# Patient Record
Sex: Female | Born: 1965
Health system: Southern US, Community
[De-identification: ages and names within clinical notes are randomized; demographics above are authoritative.]

## PROBLEM LIST (undated history)

## (undated) DIAGNOSIS — R569 Unspecified convulsions: Secondary | ICD-10-CM

## (undated) DIAGNOSIS — B54 Unspecified malaria: Secondary | ICD-10-CM

## (undated) DIAGNOSIS — O039 Complete or unspecified spontaneous abortion without complication: Secondary | ICD-10-CM

## (undated) DIAGNOSIS — S32000A Wedge compression fracture of unspecified lumbar vertebra, initial encounter for closed fracture: Secondary | ICD-10-CM

## (undated) DIAGNOSIS — C801 Malignant (primary) neoplasm, unspecified: Secondary | ICD-10-CM

## (undated) DIAGNOSIS — J329 Chronic sinusitis, unspecified: Secondary | ICD-10-CM

## (undated) DIAGNOSIS — J189 Pneumonia, unspecified organism: Secondary | ICD-10-CM

## (undated) DIAGNOSIS — R519 Headache, unspecified: Secondary | ICD-10-CM

## (undated) DIAGNOSIS — R51 Headache: Secondary | ICD-10-CM

## (undated) HISTORY — PX: WISDOM TOOTH EXTRACTION: SHX21

## (undated) HISTORY — DX: Chronic sinusitis, unspecified: J32.9

## (undated) HISTORY — DX: Wedge compression fracture of unspecified lumbar vertebra, initial encounter for closed fracture: S32.000A

## (undated) HISTORY — PX: DENTAL SURGERY: SHX609

---

## 1998-01-04 ENCOUNTER — Other Ambulatory Visit: Admission: RE | Admit: 1998-01-04 | Discharge: 1998-01-04 | Payer: Self-pay | Admitting: Obstetrics & Gynecology

## 1999-03-25 ENCOUNTER — Other Ambulatory Visit: Admission: RE | Admit: 1999-03-25 | Discharge: 1999-03-25 | Payer: Self-pay | Admitting: Obstetrics & Gynecology

## 2000-05-10 ENCOUNTER — Other Ambulatory Visit: Admission: RE | Admit: 2000-05-10 | Discharge: 2000-05-10 | Payer: Self-pay | Admitting: Obstetrics & Gynecology

## 2001-05-31 ENCOUNTER — Other Ambulatory Visit: Admission: RE | Admit: 2001-05-31 | Discharge: 2001-05-31 | Payer: Self-pay | Admitting: Obstetrics & Gynecology

## 2003-07-25 ENCOUNTER — Other Ambulatory Visit: Admission: RE | Admit: 2003-07-25 | Discharge: 2003-07-25 | Payer: Self-pay | Admitting: Obstetrics & Gynecology

## 2007-07-29 ENCOUNTER — Ambulatory Visit (HOSPITAL_COMMUNITY): Admission: RE | Admit: 2007-07-29 | Discharge: 2007-07-29 | Payer: Self-pay | Admitting: Family Medicine

## 2013-12-29 ENCOUNTER — Ambulatory Visit (INDEPENDENT_AMBULATORY_CARE_PROVIDER_SITE_OTHER): Payer: 59 | Admitting: Internal Medicine

## 2013-12-29 DIAGNOSIS — Z9189 Other specified personal risk factors, not elsewhere classified: Secondary | ICD-10-CM

## 2013-12-29 DIAGNOSIS — Z23 Encounter for immunization: Secondary | ICD-10-CM

## 2013-12-29 MED ORDER — CIPROFLOXACIN HCL 500 MG PO TABS: 500.0000 mg | ORAL_TABLET | Freq: Two times a day (BID) | ORAL | Status: DC

## 2013-12-29 NOTE — Progress Notes (Deleted)
Patient ID: Danielle Guzman, female   DOB: 09/23/1965, 48 y.o.   MRN: [id number]

## 2013-12-29 NOTE — Progress Notes (Signed)
   Subjective:    Patient ID: Danielle Guzman, female    DOB: [birth year], 48 y.o.   MRN: [id number]  HPI Danielle Guzman is a 48yo F who is in good health going to [place] and [place] for a 10 day trip, leaving on Oct 20th and return on Oct 30th.  Recently received: hep A #1, typhoid oral (Started yesterday), tdap, and given malarone  Previously lived in [place], now resides in [place], [place]. She has traveled to [place], [place], [place]  Review of Systems     Objective:   Physical Exam        Assessment & Plan:  Malaria = proph to start malarone  2 days prior to trip and 7 days post her return. Also gave recs of deet and premethrin spray  Vaccine = will give yellow fever  Traveler diarrhea = gave rx for cipro if needed  Gave note for her to carry last dose of vivotif on plane. Ideally should have received inj typhoid, unlikely to mount response in time for trip due to just starting her medicaiton.  Recommended emergency evacuation insurance

## 2016-03-16 DIAGNOSIS — C801 Malignant (primary) neoplasm, unspecified: Secondary | ICD-10-CM

## 2016-03-16 HISTORY — DX: Malignant (primary) neoplasm, unspecified: C80.1

## 2016-07-27 ENCOUNTER — Encounter (HOSPITAL_COMMUNITY): Payer: Self-pay | Admitting: *Deleted

## 2016-08-11 ENCOUNTER — Ambulatory Visit (HOSPITAL_COMMUNITY): Payer: 59 | Admitting: Anesthesiology

## 2016-08-11 ENCOUNTER — Ambulatory Visit (HOSPITAL_COMMUNITY)
Admission: RE | Admit: 2016-08-11 | Discharge: 2016-08-11 | Disposition: A | Discharge status: Home / Self Care | Payer: 59 | Source: Ambulatory Visit | Attending: Obstetrics | Admitting: Obstetrics

## 2016-08-11 ENCOUNTER — Encounter (HOSPITAL_COMMUNITY)
Admission: RE | Disposition: A | Discharge status: Home / Self Care | Payer: Self-pay | Source: Ambulatory Visit | Attending: Obstetrics

## 2016-08-11 ENCOUNTER — Encounter (HOSPITAL_COMMUNITY): Payer: Self-pay | Admitting: Obstetrics

## 2016-08-11 DIAGNOSIS — N950 Postmenopausal bleeding: Secondary | ICD-10-CM | POA: Insufficient documentation

## 2016-08-11 DIAGNOSIS — Z87891 Personal history of nicotine dependence: Secondary | ICD-10-CM | POA: Diagnosis not present

## 2016-08-11 DIAGNOSIS — C541 Malignant neoplasm of endometrium: Secondary | ICD-10-CM | POA: Insufficient documentation

## 2016-08-11 DIAGNOSIS — Z79899 Other long term (current) drug therapy: Secondary | ICD-10-CM | POA: Insufficient documentation

## 2016-08-11 DIAGNOSIS — Z91040 Latex allergy status: Secondary | ICD-10-CM | POA: Insufficient documentation

## 2016-08-11 HISTORY — PX: DILATATION & CURETTAGE/HYSTEROSCOPY WITH MYOSURE: SHX6511

## 2016-08-11 LAB — CBC
HEMATOCRIT: 43.1 % (ref 36.0–46.0)
Hemoglobin: 14.1 g/dL (ref 12.0–15.0)
MCH: 26.9 pg (ref 26.0–34.0)
MCHC: 32.7 g/dL (ref 30.0–36.0)
MCV: 82.3 fL (ref 78.0–100.0)
PLATELETS: 265 10*3/uL (ref 150–400)
RBC: 5.24 MIL/uL — ABNORMAL HIGH (ref 3.87–5.11)
RDW: 14.4 % (ref 11.5–15.5)
WBC: 7.1 10*3/uL (ref 4.0–10.5)

## 2016-08-11 SURGERY — DILATATION & CURETTAGE/HYSTEROSCOPY WITH MYOSURE
Anesthesia: General | Site: Vagina

## 2016-08-11 MED ORDER — SCOPOLAMINE 1 MG/3DAYS TD PT72
1.0000 | MEDICATED_PATCH | Freq: Once | TRANSDERMAL | Status: DC
  Administered 2016-08-11: 1.5 mg via TRANSDERMAL

## 2016-08-11 MED ORDER — MIDAZOLAM HCL 2 MG/2ML IJ SOLN
INTRAMUSCULAR | Status: DC | PRN
  Administered 2016-08-11: 2 mg via INTRAVENOUS

## 2016-08-11 MED ORDER — LIDOCAINE HCL (CARDIAC) 20 MG/ML IV SOLN
INTRAVENOUS | Status: AC
  Filled 2016-08-11: qty 5

## 2016-08-11 MED ORDER — LACTATED RINGERS IV SOLN
INTRAVENOUS | Status: DC
  Administered 2016-08-11: 1000 mL via INTRAVENOUS
  Administered 2016-08-11: 13:00:00 via INTRAVENOUS

## 2016-08-11 MED ORDER — DEXAMETHASONE SODIUM PHOSPHATE 4 MG/ML IJ SOLN
INTRAMUSCULAR | Status: DC | PRN
  Administered 2016-08-11: 4 mg via INTRAVENOUS

## 2016-08-11 MED ORDER — FENTANYL CITRATE (PF) 250 MCG/5ML IJ SOLN
INTRAMUSCULAR | Status: AC
  Filled 2016-08-11: qty 5

## 2016-08-11 MED ORDER — KETOROLAC TROMETHAMINE 30 MG/ML IJ SOLN
INTRAMUSCULAR | Status: DC | PRN
  Administered 2016-08-11: 30 mg via INTRAVENOUS

## 2016-08-11 MED ORDER — DEXAMETHASONE SODIUM PHOSPHATE 4 MG/ML IJ SOLN
INTRAMUSCULAR | Status: AC
  Filled 2016-08-11: qty 1

## 2016-08-11 MED ORDER — LIDOCAINE HCL 1 % IJ SOLN
INTRAMUSCULAR | Status: DC | PRN
  Administered 2016-08-11: 10 mL

## 2016-08-11 MED ORDER — SCOPOLAMINE 1 MG/3DAYS TD PT72
MEDICATED_PATCH | TRANSDERMAL | Status: AC
  Administered 2016-08-11: 1.5 mg via TRANSDERMAL
  Filled 2016-08-11: qty 1

## 2016-08-11 MED ORDER — LACTATED RINGERS IV SOLN: INTRAVENOUS | Status: DC

## 2016-08-11 MED ORDER — MIDAZOLAM HCL 2 MG/2ML IJ SOLN
INTRAMUSCULAR | Status: AC
  Filled 2016-08-11: qty 2

## 2016-08-11 MED ORDER — ONDANSETRON HCL 4 MG/2ML IJ SOLN
INTRAMUSCULAR | Status: DC | PRN
  Administered 2016-08-11: 4 mg via INTRAVENOUS

## 2016-08-11 MED ORDER — SODIUM CHLORIDE 0.9 % IR SOLN
Status: DC | PRN
  Administered 2016-08-11: 3000 mL

## 2016-08-11 MED ORDER — PROPOFOL 10 MG/ML IV BOLUS
INTRAVENOUS | Status: AC
  Filled 2016-08-11: qty 20

## 2016-08-11 MED ORDER — LIDOCAINE HCL 1 % IJ SOLN
INTRAMUSCULAR | Status: AC
  Filled 2016-08-11: qty 20

## 2016-08-11 MED ORDER — PHENYLEPHRINE 40 MCG/ML (10ML) SYRINGE FOR IV PUSH (FOR BLOOD PRESSURE SUPPORT)
PREFILLED_SYRINGE | INTRAVENOUS | Status: AC
  Filled 2016-08-11: qty 10

## 2016-08-11 MED ORDER — FENTANYL CITRATE (PF) 100 MCG/2ML IJ SOLN
INTRAMUSCULAR | Status: DC | PRN
  Administered 2016-08-11 (×3): 50 ug via INTRAVENOUS

## 2016-08-11 MED ORDER — PROPOFOL 10 MG/ML IV BOLUS
INTRAVENOUS | Status: DC | PRN
  Administered 2016-08-11: 180 mg via INTRAVENOUS

## 2016-08-11 MED ORDER — ONDANSETRON HCL 4 MG/2ML IJ SOLN
INTRAMUSCULAR | Status: AC
  Filled 2016-08-11: qty 2

## 2016-08-11 MED ORDER — KETOROLAC TROMETHAMINE 30 MG/ML IJ SOLN
INTRAMUSCULAR | Status: AC
  Filled 2016-08-11: qty 1

## 2016-08-11 MED ORDER — LIDOCAINE HCL (CARDIAC) 20 MG/ML IV SOLN
INTRAVENOUS | Status: DC | PRN
  Administered 2016-08-11: 60 mg via INTRAVENOUS

## 2016-08-11 SURGICAL SUPPLY — 19 items
CATH ROBINSON RED A/P 16FR (CATHETERS) IMPLANT
CATH SILICONE 16FRX5CC (CATHETERS) ×3 IMPLANT
CLOTH BEACON ORANGE TIMEOUT ST (SAFETY) ×3 IMPLANT
CONTAINER PREFILL 10% NBF 60ML (FORM) ×6 IMPLANT
DEVICE MYOSURE LITE (MISCELLANEOUS) ×3 IMPLANT
DEVICE MYOSURE REACH (MISCELLANEOUS) IMPLANT
FILTER ARTHROSCOPY CONVERTOR (FILTER) ×3 IMPLANT
GLOVE BIOGEL PI IND STRL 6.5 (GLOVE) ×1 IMPLANT
GLOVE BIOGEL PI IND STRL 7.0 (GLOVE) ×1 IMPLANT
GLOVE BIOGEL PI INDICATOR 6.5 (GLOVE) ×2
GLOVE BIOGEL PI INDICATOR 7.0 (GLOVE) ×2
GLOVE ECLIPSE 6.0 STRL STRAW (GLOVE) ×3 IMPLANT
GOWN STRL REUS W/TWL LRG LVL3 (GOWN DISPOSABLE) ×6 IMPLANT
PACK VAGINAL MINOR WOMEN LF (CUSTOM PROCEDURE TRAY) ×3 IMPLANT
PAD OB MATERNITY 4.3X12.25 (PERSONAL CARE ITEMS) ×3 IMPLANT
SEAL ROD LENS SCOPE MYOSURE (ABLATOR) ×3 IMPLANT
TOWEL OR 17X24 6PK STRL BLUE (TOWEL DISPOSABLE) ×6 IMPLANT
TUBING AQUILEX INFLOW (TUBING) ×3 IMPLANT
TUBING AQUILEX OUTFLOW (TUBING) ×3 IMPLANT

## 2016-08-11 NOTE — Brief Op Note (Signed)
08/11/2016  1:03 PM  PATIENT:  Danielle Guzman  51 y.o. female  PRE-OPERATIVE DIAGNOSIS:  Postmenopausal bleeding, POLYP  POST-OPERATIVE DIAGNOSIS:  Postmenopausal bleeding, thickened endometrium, polyp  PROCEDURE:  Procedure(s) with comments: DILATATION & CURETTAGE/HYSTEROSCOPY WITH MYOSURE (N/A) - polyp removal   SURGEON:  Surgeon(s) and Role:    Jerelyn Charles, MD - Primary  ANESTHESIA:   general  EBL:  Total I/O In: 1100 [I.V.:1100] Out: 2 [Urine:20; Blood:40]  BLOOD ADMINISTERED:none  DRAINS: none   LOCAL MEDICATIONS USED:  LIDOCAINE  and Amount: 10 ml  SPECIMEN:  Source of Specimen:  endometrial curettings  DISPOSITION OF SPECIMEN:  PATHOLOGY  COUNTS:  YES  TOURNIQUET:  * No tourniquets in log *  DICTATION: .Note written in EPIC  PLAN OF CARE: Discharge to home after PACU  PATIENT DISPOSITION:  PACU - hemodynamically stable.   Delay start of Pharmacological VTE agent (>24hrs) due to surgical blood loss or risk of bleeding: not applicable

## 2016-08-11 NOTE — Anesthesia Postprocedure Evaluation (Signed)
Anesthesia Post Note  Patient: Danielle Guzman  Procedure(s) Performed: Procedure(s) (LRB): DILATATION & CURETTAGE/HYSTEROSCOPY WITH MYOSURE (N/A)  Patient location during evaluation: PACU Anesthesia Type: General Level of consciousness: awake and alert, patient cooperative and oriented Pain management: pain level controlled Vital Signs Assessment: post-procedure vital signs reviewed and stable Respiratory status: spontaneous breathing, nonlabored ventilation and respiratory function stable Cardiovascular status: blood pressure returned to baseline and stable Postop Assessment: no signs of nausea or vomiting Anesthetic complications: no        Last Vitals:  Vitals:   08/11/16 1345 08/11/16 1430  BP: 121/73 123/64  Pulse: 69 (!) 59  Resp: 16 18  Temp: 36.6 C 36.6 C    Last Pain:  Vitals:   08/11/16 1430  TempSrc:   PainSc: 2    Pain Goal: Patients Stated Pain Goal: 4 (08/11/16 1133)               Mckinzee Spirito,E. Sharyl Panchal

## 2016-08-11 NOTE — Anesthesia Preprocedure Evaluation (Addendum)
Anesthesia Evaluation  Patient identified by MRN, date of birth, ID band Patient awake    Reviewed: Allergy & Precautions, NPO status , Patient's Chart, lab work & pertinent test results  History of Anesthesia Complications Negative for: history of anesthetic complications  Airway Mallampati: II  TM Distance: >3 FB Neck ROM: Full    Dental  (+) Dental Advisory Given   Pulmonary former smoker,    breath sounds clear to auscultation       Cardiovascular negative cardio ROS   Rhythm:Regular Rate:Normal     Neuro/Psych negative neurological ROS     GI/Hepatic negative GI ROS, Neg liver ROS,   Endo/Other  negative endocrine ROS  Renal/GU negative Renal ROS     Musculoskeletal negative musculoskeletal ROS (+)   Abdominal   Peds  Hematology negative hematology ROS (+)   Anesthesia Other Findings   Reproductive/Obstetrics Post menopausal (early-age 14)                             Anesthesia Physical Anesthesia Plan  ASA: II  Anesthesia Plan: General   Post-op Pain Management:    Induction: Intravenous  Airway Management Planned: LMA  Additional Equipment:   Intra-op Plan:   Post-operative Plan:   Informed Consent: I have reviewed the patients History and Physical, chart, labs and discussed the procedure including the risks, benefits and alternatives for the proposed anesthesia with the patient or authorized representative who has indicated his/her understanding and acceptance.   Dental advisory given  Plan Discussed with: CRNA and Surgeon  Anesthesia Plan Comments: (Plan routine monitors, GA- LMA OK)        Anesthesia Quick Evaluation

## 2016-08-11 NOTE — Anesthesia Procedure Notes (Signed)
Procedure Name: LMA Insertion Date/Time: 08/11/2016 12:20 PM Performed by: Elenore Paddy Pre-anesthesia Checklist: Patient identified, Emergency Drugs available, Suction available, Patient being monitored and Timeout performed Patient Re-evaluated:Patient Re-evaluated prior to inductionOxygen Delivery Method: Circle system utilized Preoxygenation: Pre-oxygenation with 100% oxygen Intubation Type: IV induction LMA: LMA inserted LMA Size: 4.0 Number of attempts: 1 Placement Confirmation: positive ETCO2 and breath sounds checked- equal and bilateral Dental Injury: Teeth and Oropharynx as per pre-operative assessment

## 2016-08-11 NOTE — Discharge Instructions (Signed)
Please take ibuprofen 600 mg every 6 hours as needed for pain.  You may also add tylenol (acetominophen) 650 mg every 6 hours as needed.  Please call the office if you experience heavy vaginal bleeding (saturating 1 pad hourly for > 2 hours in a row), temperature >101F, severe nausea, vomiting or any other concerns.

## 2016-08-11 NOTE — H&P (Signed)
51 y.o.  presents for hysteroscopy, myosure for postmenopausal bleeding.  She had a 4 day episode of PMB in March.  US showed a thickened endometrial stripe at 1.8cm, suspicious for possible polyp.  Bleeding returned again approximately 10 days ago and last for about a week.  Is not bleeding today.    Past Medical History:  Diagnosis Date  . Missed ab    x 2 - no surgery required     Past Surgical History:  Procedure Laterality Date  . WISDOM TOOTH EXTRACTION      OB History  Gravida Para Term Preterm AB Living  2       2    SAB TAB Ectopic Multiple Live Births  2            # Outcome Date GA Lbr Len/2nd Weight Sex Delivery Anes PTL Lv  2 SAB           1 SAB               Social History   Social History  . Marital status: Married    Spouse name: N/A  . Number of children: N/A  . Years of education: N/A   Occupational History  . Not on file.   Social History Main Topics  . Smoking status: Former Smoker    Years: 8.00    Types: Cigarettes  . Smokeless tobacco: Never Used     Comment: quit smoking at age 27  . Alcohol use 2.4 oz/week    4 Glasses of wine per week     Comment: wine  . Drug use: No  . Sexual activity: Yes    Birth control/ protection: Post-menopausal   Other Topics Concern  . Not on file   Social History Narrative  . No narrative on file   Latex    Vitals:   08/11/16 1133  BP: 132/77  Resp: 18  Temp: 98.1 F (36.7 C)     General:  NAD Abdomen:  soft Ext: SCDs    A/P   51 y.o.   presents for hysteroscopy, myosure for postmenopausal bleeding.   2 episodes of postmenopausal bleeding.  [place] with thickened endometrial stripe suggestive of an endometrial polyp. Discussed risks to include infection, bleeding, damage to surrounding structures (including but not limited to vagina, cervix, bladder, uterus), uterine perforation, inability to fully sample endometrium, recurrence of polyp, need for additional procedures.  All questions answered and  patient elects to proceed.   [place]

## 2016-08-11 NOTE — Op Note (Signed)
Operative Note  Pre-operative Diagnosis: postmenopausal bleeding, thickened endometrium, suspected endometrial polyp  Post-operative Diagnosis: Thickened endometrium, possible endometrial polyp  Surgeon: Jerelyn Charles, MD  Assistants: n/a  Procedure: hysteroscopy, polypectomy and curettage with MyoSure  Anesthesia: general  Estimated Blood Loss: 40 mL         Drains: none         Specimens: endometrial curettings and to pathology         Findings: Anteverted uterus.  Diffusely thickened and hypervascular endometrium.  Anteriorly, possible endometrial polyp within thickened endometrium.   Description of Procedure:         After adequate anesthesia was achieved, the patient placed in the dorsal lithotomy position in [place].  She was prepped and draped in the usual sterile fashion.  The bladder was drained with a catheter.  A bimanual exam revealed an anteverted uterus.  The bivalve speculum was placed in the vagina and the anterior lip of the cervix grasped with a single-tooth tenaculum.  The cervix was serially dilated with Hank dilators to 11mm.  Under direct visualization, the hysteroscope was advanced.  The uterine cavity was surveyed. The endometrium was noted to be diffusely thickened.  There was an area in the anterior endometrium that was consistent with a polyp.  The MyoSure device was advanced into the uterine cavity.  Under direct visualization, thickened endometrium was sampled in all quadrants.  There was an area in the right fundus that was not adequately able to be sampled with the MyoSure device, so the hysteroscope was removed and a sharp curettage was performed.  The hysteroscope was advanced back into the cavity, and a uniform endometrial cavity seen without residual polyps.  The tubal ostia were now able to be identified. No residual polyps or areas of thickened endometrium were seen.  The endometrial curettings were sent to pathology.  The hysteroscope was removed.   All the vaginal instruments were removed. The tenaculum site was hemostatic. Counts were correct.  The patient was awakened from anesthesia and transferred to PACU in stable condition.    [place], [facility]

## 2016-08-11 NOTE — Transfer of Care (Signed)
Immediate Anesthesia Transfer of Care Note  Patient: Danielle Guzman  Procedure(s) Performed: Procedure(s) with comments: DILATATION & CURETTAGE/HYSTEROSCOPY WITH MYOSURE (N/A) - polyp removal   Patient Location: PACU  Anesthesia Type:General  Level of Consciousness: awake, alert  and oriented  Airway & Oxygen Therapy: Patient Spontanous Breathing and Patient connected to nasal cannula oxygen  Post-op Assessment: Report given to RN and Post -op Vital signs reviewed and stable  Post vital signs: Reviewed and stable  Last Vitals:  Vitals:   08/11/16 1133  BP: 132/77  Resp: 18  Temp: 36.7 C    Last Pain:  Vitals:   08/11/16 1133  TempSrc: Oral      Patients Stated Pain Goal: 4 (60/63/01 6010)  Complications: No apparent anesthesia complications

## 2016-08-12 ENCOUNTER — Encounter (HOSPITAL_COMMUNITY): Payer: Self-pay | Admitting: Obstetrics

## 2016-08-21 ENCOUNTER — Telehealth: Payer: Self-pay | Admitting: *Deleted

## 2016-08-21 NOTE — Telephone Encounter (Signed)
Patient called back and appt scheduled for Monday. Patient aware of the date/time

## 2016-08-21 NOTE — Telephone Encounter (Signed)
Attempted to contact the patient, left message to call the office regarding appt. Patient needs new pt appt scheduled

## 2016-08-24 ENCOUNTER — Encounter: Payer: Self-pay | Admitting: Gynecologic Oncology

## 2016-08-24 ENCOUNTER — Ambulatory Visit: Payer: 59 | Attending: Gynecologic Oncology | Admitting: Gynecologic Oncology

## 2016-08-24 VITALS — BP 120/67 | HR 90 | Temp 98.7°F | Resp 18 | Ht 65.28 in | Wt 207.1 lb

## 2016-08-24 DIAGNOSIS — Z888 Allergy status to other drugs, medicaments and biological substances status: Secondary | ICD-10-CM | POA: Diagnosis not present

## 2016-08-24 DIAGNOSIS — N950 Postmenopausal bleeding: Secondary | ICD-10-CM | POA: Diagnosis not present

## 2016-08-24 DIAGNOSIS — Z87891 Personal history of nicotine dependence: Secondary | ICD-10-CM | POA: Diagnosis not present

## 2016-08-24 DIAGNOSIS — Z800 Family history of malignant neoplasm of digestive organs: Secondary | ICD-10-CM | POA: Diagnosis not present

## 2016-08-24 DIAGNOSIS — Z79899 Other long term (current) drug therapy: Secondary | ICD-10-CM | POA: Diagnosis not present

## 2016-08-24 DIAGNOSIS — Z9889 Other specified postprocedural states: Secondary | ICD-10-CM | POA: Diagnosis not present

## 2016-08-24 DIAGNOSIS — C541 Malignant neoplasm of endometrium: Secondary | ICD-10-CM | POA: Insufficient documentation

## 2016-08-24 DIAGNOSIS — Z803 Family history of malignant neoplasm of breast: Secondary | ICD-10-CM | POA: Insufficient documentation

## 2016-08-24 DIAGNOSIS — N941 Unspecified dyspareunia: Secondary | ICD-10-CM | POA: Diagnosis not present

## 2016-08-24 DIAGNOSIS — Z8049 Family history of malignant neoplasm of other genital organs: Secondary | ICD-10-CM | POA: Diagnosis not present

## 2016-08-24 NOTE — Progress Notes (Signed)
Consult Note: Gyn-Onc  Consult was requested by Dr. Loletta Specter and Dr Stann Mainland for the evaluation of Tedra Senegal 51 y.o. female  CC:  Chief Complaint  Patient presents with  . Endometrial cancer    New patient    Assessment/Plan:  Ms. JONEA BUKOWSKI  is a 51 y.o.  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  A detailed discussion was held with the patient and her family with regard to to her endometrial cancer diagnosis. We discussed the standard management options for uterine cancer which includes surgery followed possibly by adjuvant therapy depending on the results of surgery. The options for surgical management include a hysterectomy and removal of the tubes and ovaries possibly with removal of pelvic and para-aortic lymph nodes.If feasible, a minimally invasive approach including a robotic hysterectomy or laparoscopic hysterectomy have benefits including shorter hospital stay, recovery time and better wound healing than with open surgery. The patient has been counseled about these surgical options and the risks of surgery in general including infection, bleeding, damage to surrounding structures (including bowel, bladder, ureters, nerves or vessels), and the postoperative risks of PE/ DVT, and lymphedema. I extensively reviewed the additional risks of robotic hysterectomy including possible need for conversion to open laparotomy.  I discussed positioning during surgery of trendelenberg and risks of minor facial swelling and care we take in preoperative positioning.  After counseling and consideration of her options, she desires to proceed with robotic assisted total hysterectomy with bilateral sapingo-oophorectomy and SLN biopsy.   I discussed that there is an increased risk for metastatic disease with high grade cancers, therefore we will order a preoperative CT abdo/pelvis to better prepare surgically.  Additionally, if clear cell/serous  carcinoma is confirmed on final pathology, she will likely be recommended to have adjuvant therapy with systemic therapy (chemotherapy) as these cancers are associated with a high risk for distant relapse even in early stage disease.  She will be seen by anesthesia for preoperative clearance and discussion of postoperative pain management.  She was given the opportunity to ask questions, which were answered to her satisfaction, and she is agreement with the above mentioned plan of care.  She has a very strong history of family members (particularly maternal) with cancer. We will test her hysterectomy specimen for MSI and refer regardless to genetics postop for further evaluation.   HPI: Kharis Lapenna is a 51 year old [id number] who is seen in consultation at the request of Dr Stann Mainland and Dr Jerelyn Charles for endometrial cancer.  The patient reports early menopause at age 36 (all of her female relatives have had early menopause). She began experiencing postmenopausal bleeding in March 2018. She was seen by Dr Stann Mainland on 06/02/16 and a TVUS showed an endometrium of 1.8cm with a 3cm hypervascular polyp.  She was taken to the OR on 08/11/16 for a hysteroscopy/D&C with Dr Jerelyn Charles and the specimen showed endometrial adenocarcinoma with the majority of the specimen revealing grade 2 endometrioid endometrial cancer with a small focus (5%) of clear cell carcinoma and a few foci suspicious for serous carcinoma.  She is otherwise very healthy. She has had no prior surgeries or vaginal deliveries.  Her mother has a history of breast cancer x 3, she has a sister with a history of cervical cancer. Her maternal aunt has a history of breast cancer. A different maternal aunt had uterine and colon cancer. Her maternal cousin had uterine cancer. Her paternal grandmother  had a diagnosis of colon cancer and her paternal cousins had hysterectomies for "abnromal cells" but no cancer.  Current Meds:  Outpatient Encounter  Prescriptions as of 08/24/2016  Medication Sig  . Ascorbic Acid (VITAMIN C) 100 MG tablet Take 100 mg by mouth 3 (three) times daily as needed (cold symptoms).   No facility-administered encounter medications on file as of 08/24/2016.     Allergy:  Allergies  Allergen Reactions  . Latex     Long exposure causes a rash  . Tamiflu  [Oseltamivir Phosphate] Nausea Only    Social Hx:   Social History   Social History  . Marital status: Married    Spouse name: N/A  . Number of children: N/A  . Years of education: N/A   Occupational History  . Not on file.   Social History Main Topics  . Smoking status: Former Smoker    Years: 8.00    Types: Cigarettes  . Smokeless tobacco: Never Used     Comment: quit smoking at age 58  . Alcohol use 2.4 oz/week    4 Glasses of wine per week     Comment: wine  . Drug use: No  . Sexual activity: Yes    Birth control/ protection: Post-menopausal   Other Topics Concern  . Not on file   Social History Narrative  . No narrative on file    Past Surgical Hx:  Past Surgical History:  Procedure Laterality Date  . DENTAL SURGERY     gum graft 20 yrs ago and again on 08/26/16  . DILATATION & CURETTAGE/HYSTEROSCOPY WITH MYOSURE N/A 08/11/2016   Procedure: DILATATION & CURETTAGE/HYSTEROSCOPY WITH MYOSURE;  Surgeon: Jerelyn Charles, MD;  Location: [facility];  Service: Gynecology;  Laterality: N/A;  polyp removal   . WISDOM TOOTH EXTRACTION      Past Medical Hx:  Past Medical History:  Diagnosis Date  . Missed ab    x 2 - no surgery required     Past Gynecological History:  Premature menopause No LMP recorded. Patient is postmenopausal.  Family Hx:  Family History  Problem Relation Age of Onset  . Cancer Mother   . Cancer Sister        Cervix  . Cancer Maternal Aunt        Breast, uterine  . Cancer Paternal Grandmother        Colon    Review of Systems:  Constitutional  Feels well,    ENT Normal appearing ears and nares  bilaterally Skin/Breast  No rash, sores, jaundice, itching, dryness Cardiovascular  No chest pain, shortness of breath, or edema  Pulmonary  No cough or wheeze.  Gastro Intestinal  No nausea, vomitting, or diarrhoea. No bright red blood per rectum, no abdominal pain, change in bowel movement, or constipation.  Genito Urinary  No frequency, urgency, dysuria, +postmenopausal bleeding, +dyspareunia Musculo Skeletal  No myalgia, arthralgia, joint swelling or pain  Neurologic  No weakness, numbness, change in gait,  Psychology  No depression, anxiety, insomnia.   Vitals:  Blood pressure 120/67, pulse 90, temperature 98.7 F (37.1 C), temperature source Oral, resp. rate 18, height 5' 5.28" (1.658 m), weight 207 lb 1.6 oz (93.9 kg).  Physical Exam: WD in NAD Neck  Supple NROM, without any enlargements.  Lymph Node Survey No cervical supraclavicular or inguinal adenopathy Cardiovascular  Pulse normal rate, regularity and rhythm. S1 and S2 normal.  Lungs  Clear to auscultation bilateraly, without wheezes/crackles/rhonchi. Good air movement.  Skin  No  rash/lesions/breakdown  Psychiatry  Alert and oriented to person, place, and time  Abdomen  Normoactive bowel sounds, abdomen soft, non-tender and obese without evidence of hernia.  Back No CVA tenderness Genito Urinary  Vulva/vagina: Normal external female genitalia.   No lesions. No discharge or bleeding.  Bladder/urethra:  No lesions or masses, well supported bladder  Vagina: normal  Cervix: Normal appearing, no lesions.  Uterus:  Small, mobile, no parametrial involvement or nodularity.  Adnexa: no palpable masses. Rectal  deferred Extremities  No bilateral cyanosis, clubbing or edema.   Donaciano Eva, MD  08/24/2016, 12:07 PM

## 2016-08-24 NOTE — Patient Instructions (Signed)
Plan to have a CT scan of the abdomen and pelvis.  Nothing to eat or drink 4 hours before.                Preparing for your Surgery  Plan for surgery on September 01, 2016 with Dr. Everitt Amber at [facility] will be scheduled for a robotic assisted total hysterectomy, bilateral salpingo-oophorectomy, sentinel lymph node biopsy.  Pre-operative Testing -You will receive a phone call from presurgical testing at [facility] to arrange for a pre-operative testing appointment before your surgery.  This appointment normally occurs one to two weeks before your scheduled surgery.   -Bring your insurance card, copy of an advanced directive if applicable, medication list  -At that visit, you will be asked to sign a consent for a possible blood transfusion in case a transfusion becomes necessary during surgery.  The need for a blood transfusion is rare but having consent is a necessary part of your care.     -You should not be taking blood thinners or aspirin at least ten days prior to surgery unless instructed by your surgeon.  Day Before Surgery at [facility] will be asked to take in a light diet the day before surgery.  Avoid carbonated beverages.  You will be advised to have nothing to eat or drink after midnight the evening before.     Eat a light diet the day before surgery.  Examples including soups, broths, toast, yogurt, mashed potatoes.  Things to avoid include carbonated beverages (fizzy beverages), raw fruits and raw vegetables, or beans.    If your bowels are filled with gas, your surgeon will have difficulty visualizing your pelvic organs which increases your surgical risks.  Your role in recovery Your role is to become active as soon as directed by your doctor, while still giving yourself time to heal.  Rest when you feel tired. You will be asked to do the following in order to speed your recovery:  - Cough and breathe deeply. This helps toclear and expand your lungs and  can prevent pneumonia. You may be given a spirometer to practice deep breathing. A staff member will show you how to use the spirometer. - Do mild physical activity. Walking or moving your legs help your circulation and body functions return to normal. A staff member will help you when you try to walk and will provide you with simple exercises. Do not try to get up or walk alone the first time. - Actively manage your pain. Managing your pain lets you move in comfort. We will ask you to rate your pain on a scale of zero to 10. It is your responsibility to tell your doctor or nurse where and how much you hurt so your pain can be treated.  Special Considerations -If you are diabetic, you may be placed on insulin after surgery to have closer control over your blood sugars to promote healing and recovery.  This does not mean that you will be discharged on insulin.  If applicable, your oral antidiabetics will be resumed when you are tolerating a solid diet.  -Your final pathology results from surgery should be available by the Friday after surgery and the results will be relayed to you when available.   Blood Transfusion Information WHAT IS A BLOOD TRANSFUSION? A transfusion is the replacement of blood or some of its parts. Blood is made up of multiple cells which provide different functions.  Red blood cells carry oxygen  and are used for blood loss replacement.  White blood cells fight against infection.  Platelets control bleeding.  Plasma helps clot blood.  Other blood products are available for specialized needs, such as hemophilia or other clotting disorders. BEFORE THE TRANSFUSION  Who gives blood for transfusions?   You may be able to donate blood to be used at a later date on yourself (autologous donation).  Relatives can be asked to donate blood. This is generally not any safer than if you have received blood from a stranger. The same precautions are taken to ensure safety when a  relative's blood is donated.  Healthy volunteers who are fully evaluated to make sure their blood is safe. This is blood bank blood. Transfusion therapy is the safest it has ever been in the practice of medicine. Before blood is taken from a donor, a complete history is taken to make sure that person has no history of diseases nor engages in risky social behavior (examples are intravenous drug use or sexual activity with multiple partners). The donor's travel history is screened to minimize risk of transmitting infections, such as malaria. The donated blood is tested for signs of infectious diseases, such as HIV and hepatitis. The blood is then tested to be sure it is compatible with you in order to minimize the chance of a transfusion reaction. If you or a relative donates blood, this is often done in anticipation of surgery and is not appropriate for emergency situations. It takes many days to process the donated blood. RISKS AND COMPLICATIONS Although transfusion therapy is very safe and saves many lives, the main dangers of transfusion include:   Getting an infectious disease.  Developing a transfusion reaction. This is an allergic reaction to something in the blood you were given. Every precaution is taken to prevent this. The decision to have a blood transfusion has been considered carefully by your caregiver before blood is given. Blood is not given unless the benefits outweigh the risks.

## 2016-08-26 NOTE — Patient Instructions (Addendum)
Danielle Guzman  08/26/2016   Your procedure is scheduled on: 09-01-16  Report to [facility] Main  Entrance Take [place]  elevators to 3rd floor to  [place] at 1015 AM.   Call this number if you have problems the morning of surgery [phone]   Remember: ONLY 1 PERSON MAY GO WITH YOU TO SHORT STAY TO GET  READY MORNING OF [place].  Do not eat food or drink liquids :After Midnight. EAT A LIGHT DIET DAY BEFORE SURGERY ON 08-31-16, SEE INSTRUCTIONS BELOW, NOTHING BY MOUTH AFTER MIDNIGHT NIGHT BEFORE SURGERY     Take these medicines the morning of surgery with A SIP OF WATER: NONE                               You may not have any metal on your body including hair pins and              piercings  Do not wear jewelry, make-up, lotions, powders or perfumes, deodorant             Do not wear nail polish.  Do not shave  48 hours prior to surgery.              Men may shave face and neck.   Do not bring valuables to the hospital. [place].  Contacts, dentures or bridgework may not be worn into surgery.  Leave suitcase in the car. After surgery it may be brought to your room.                 Please read over the following fact sheets you were given: _____________________________________________________________________  Eat a light diet the day before surgery.  Examples including soups, broths, toast, yogurt, mashed potatoes.  Things to avoid include carbonated beverages (fizzy beverages), raw fruits and raw vegetables, or beans.   If your bowels are filled with gas, your surgeon will have difficulty visualizing your pelvic organs which increases your surgical risks.           [place] - Preparing for Surgery Before surgery, you can play an important role.  Because skin is not sterile, your skin needs to be as free of germs as possible.  You can reduce the number of germs on your skin by  washing with CHG (chlorahexidine gluconate) soap before surgery.  CHG is an antiseptic cleaner which kills germs and bonds with the skin to continue killing germs even after washing. Please DO NOT use if you have an allergy to CHG or antibacterial soaps.  If your skin becomes reddened/irritated stop using the CHG and inform your nurse when you arrive at Short Stay. Do not shave (including legs and underarms) for at least 48 hours prior to the first CHG shower.  You may shave your face/neck. Please follow these instructions carefully:  1.  Shower with CHG Soap the night before surgery and the  morning of Surgery.  2.  If you choose to wash your hair, wash your hair first as usual with your  normal  shampoo.  3.  After you shampoo, rinse your hair and body thoroughly to remove the  shampoo.  4.  Use CHG as you would any other liquid soap.  You can apply chg directly  to the skin and wash                       Gently with a scrungie or clean washcloth.  5.  Apply the CHG Soap to your body ONLY FROM THE NECK DOWN.   Do not use on face/ open                           Wound or open sores. Avoid contact with eyes, ears mouth and genitals (private parts).                       Wash face,  Genitals (private parts) with your normal soap.             6.  Wash thoroughly, paying special attention to the area where your surgery  will be performed.  7.  Thoroughly rinse your body with warm water from the neck down.  8.  DO NOT shower/wash with your normal soap after using and rinsing off  the CHG Soap.                9.  Pat yourself dry with a clean towel.            10.  Wear clean pajamas.            11.  Place clean sheets on your bed the night of your first shower and do not  sleep with pets. Day of Surgery : Do not apply any lotions/deodorants the morning of surgery.  Please wear clean clothes to the hospital/surgery center.  FAILURE TO FOLLOW THESE INSTRUCTIONS MAY RESULT IN THE  CANCELLATION OF YOUR SURGERY PATIENT SIGNATURE_________________________________  NURSE SIGNATURE__________________________________  ________________________________________________________________________   Adam Phenix  An incentive spirometer is a tool that can help keep your lungs clear and active. This tool measures how well you are filling your lungs with each breath. Taking long deep breaths may help reverse or decrease the chance of developing breathing (pulmonary) problems (especially infection) following:  A long period of time when you are unable to move or be active. BEFORE THE PROCEDURE   If the spirometer includes an indicator to show your best effort, your nurse or respiratory therapist will set it to a desired goal.  If possible, sit up straight or lean slightly forward. Try not to slouch.  Hold the incentive spirometer in an upright position. INSTRUCTIONS FOR USE  1. Sit on the edge of your bed if possible, or sit up as far as you can in bed or on a chair. 2. Hold the incentive spirometer in an upright position. 3. Breathe out normally. 4. Place the mouthpiece in your mouth and seal your lips tightly around it. 5. Breathe in slowly and as deeply as possible, raising the piston or the ball toward the top of the column. 6. Hold your breath for 3-5 seconds or for as long as possible. Allow the piston or ball to fall to the bottom of the column. 7. Remove the mouthpiece from your mouth and breathe out normally. 8. Rest for a few seconds and repeat Steps 1 through 7 at least 10 times every 1-2 hours when you are awake. Take your time and take a few normal breaths between deep breaths. 9. The spirometer may include an indicator to show  your best effort. Use the indicator as a goal to work toward during each repetition. 10. After each set of 10 deep breaths, practice coughing to be sure your lungs are clear. If you have an incision (the cut made at the time of surgery),  support your incision when coughing by placing a pillow or rolled up towels firmly against it. Once you are able to get out of bed, walk around indoors and cough well. You may stop using the incentive spirometer when instructed by your caregiver.  RISKS AND COMPLICATIONS  Take your time so you do not get dizzy or light-headed.  If you are in pain, you may need to take or ask for pain medication before doing incentive spirometry. It is harder to take a deep breath if you are having pain. AFTER USE  Rest and breathe slowly and easily.  It can be helpful to keep track of a log of your progress. Your caregiver can provide you with a simple table to help with this. If you are using the spirometer at home, follow these instructions: [place] IF:   You are having difficultly using the spirometer.  You have trouble using the spirometer as often as instructed.  Your pain medication is not giving enough relief while using the spirometer.  You develop fever of 100.5 F (38.1 C) or higher. SEEK IMMEDIATE MEDICAL CARE IF:   You cough up bloody sputum that had not been present before.  You develop fever of 102 F (38.9 C) or greater.  You develop worsening pain at or near the incision site. MAKE SURE YOU:   Understand these instructions.  Will watch your condition.  Will get help right away if you are not doing well or get worse. Document Released: 07/13/2006 Document Revised: 05/25/2011 Document Reviewed: 09/13/2006 ExitCare Patient Information 2014 ExitCare, [place].   ________________________________________________________________________  WHAT IS A BLOOD TRANSFUSION? Blood Transfusion Information  A transfusion is the replacement of blood or some of its parts. Blood is made up of multiple cells which provide different functions.  Red blood cells carry oxygen and are used for blood loss replacement.  White blood cells fight against infection.  Platelets control  bleeding.  Plasma helps clot blood.  Other blood products are available for specialized needs, such as hemophilia or other clotting disorders. BEFORE THE TRANSFUSION  Who gives blood for transfusions?   Healthy volunteers who are fully evaluated to make sure their blood is safe. This is blood bank blood. Transfusion therapy is the safest it has ever been in the practice of medicine. Before blood is taken from a donor, a complete history is taken to make sure that person has no history of diseases nor engages in risky social behavior (examples are intravenous drug use or sexual activity with multiple partners). The donor's travel history is screened to minimize risk of transmitting infections, such as malaria. The donated blood is tested for signs of infectious diseases, such as HIV and hepatitis. The blood is then tested to be sure it is compatible with you in order to minimize the chance of a transfusion reaction. If you or a relative donates blood, this is often done in anticipation of surgery and is not appropriate for emergency situations. It takes many days to process the donated blood. RISKS AND COMPLICATIONS Although transfusion therapy is very safe and saves many lives, the main dangers of transfusion include:   Getting an infectious disease.  Developing a transfusion reaction. This is an allergic reaction to  something in the blood you were given. Every precaution is taken to prevent this. The decision to have a blood transfusion has been considered carefully by your caregiver before blood is given. Blood is not given unless the benefits outweigh the risks. AFTER THE TRANSFUSION  Right after receiving a blood transfusion, you will usually feel much better and more energetic. This is especially true if your red blood cells have gotten low (anemic). The transfusion raises the level of the red blood cells which carry oxygen, and this usually causes an energy increase.  The nurse  administering the transfusion will monitor you carefully for complications. HOME CARE INSTRUCTIONS  No special instructions are needed after a transfusion. You may find your energy is better. Speak with your caregiver about any limitations on activity for underlying diseases you may have. SEEK MEDICAL CARE IF:   Your condition is not improving after your transfusion.  You develop redness or irritation at the intravenous (IV) site. SEEK IMMEDIATE MEDICAL CARE IF:  Any of the following symptoms occur over the next 12 hours:  Shaking chills.  You have a temperature by mouth above 102 F (38.9 C), not controlled by medicine.  Chest, back, or muscle pain.  People around you feel you are not acting correctly or are confused.  Shortness of breath or difficulty breathing.  Dizziness and fainting.  You get a rash or develop hives.  You have a decrease in urine output.  Your urine turns a dark color or changes to pink, red, or brown. Any of the following symptoms occur over the next 10 days:  You have a temperature by mouth above 102 F (38.9 C), not controlled by medicine.  Shortness of breath.  Weakness after normal activity.  The white part of the eye turns yellow (jaundice).  You have a decrease in the amount of urine or are urinating less often.  Your urine turns a dark color or changes to pink, red, or brown. Document Released: 02/28/2000 Document Revised: 05/25/2011 Document Reviewed: 10/17/2007 ExitCare Patient Information 2014 [place] OFTEN TO PREVENT PNEUMONIA AFTER SURGERY _______________________________________________________________________

## 2016-08-27 ENCOUNTER — Encounter (HOSPITAL_COMMUNITY)
Admission: RE | Admit: 2016-08-27 | Discharge: 2016-08-27 | Disposition: A | Discharge status: Home / Self Care | Payer: 59 | Source: Ambulatory Visit | Attending: Gynecologic Oncology | Admitting: Gynecologic Oncology

## 2016-08-27 ENCOUNTER — Encounter (HOSPITAL_COMMUNITY): Payer: Self-pay

## 2016-08-27 ENCOUNTER — Ambulatory Visit (HOSPITAL_COMMUNITY): Admission: RE | Admit: 2016-08-27 | Payer: 59 | Source: Ambulatory Visit

## 2016-08-27 ENCOUNTER — Ambulatory Visit (HOSPITAL_COMMUNITY)
Admission: RE | Admit: 2016-08-27 | Discharge: 2016-08-27 | Disposition: A | Discharge status: Home / Self Care | Payer: 59 | Source: Ambulatory Visit | Attending: Gynecologic Oncology | Admitting: Gynecologic Oncology

## 2016-08-27 DIAGNOSIS — C541 Malignant neoplasm of endometrium: Secondary | ICD-10-CM

## 2016-08-27 DIAGNOSIS — R911 Solitary pulmonary nodule: Secondary | ICD-10-CM | POA: Insufficient documentation

## 2016-08-27 HISTORY — DX: Pneumonia, unspecified organism: J18.9

## 2016-08-27 HISTORY — DX: Complete or unspecified spontaneous abortion without complication: O03.9

## 2016-08-27 HISTORY — DX: Unspecified convulsions: R56.9

## 2016-08-27 HISTORY — DX: Headache: R51

## 2016-08-27 HISTORY — DX: Malignant (primary) neoplasm, unspecified: C80.1

## 2016-08-27 HISTORY — DX: Headache, unspecified: R51.9

## 2016-08-27 HISTORY — DX: Unspecified malaria: B54

## 2016-08-27 LAB — CBC WITH DIFFERENTIAL/PLATELET
BASOS PCT: 0 %
Basophils Absolute: 0 10*3/uL (ref 0.0–0.1)
EOS ABS: 0.3 10*3/uL (ref 0.0–0.7)
EOS PCT: 3 %
HEMATOCRIT: 42 % (ref 36.0–46.0)
Hemoglobin: 13.7 g/dL (ref 12.0–15.0)
Lymphocytes Relative: 26 %
Lymphs Abs: 2.3 10*3/uL (ref 0.7–4.0)
MCH: 26.6 pg (ref 26.0–34.0)
MCHC: 32.6 g/dL (ref 30.0–36.0)
MCV: 81.6 fL (ref 78.0–100.0)
MONO ABS: 0.5 10*3/uL (ref 0.1–1.0)
MONOS PCT: 5 %
NEUTROS ABS: 5.8 10*3/uL (ref 1.7–7.7)
Neutrophils Relative %: 66 %
PLATELETS: 254 10*3/uL (ref 150–400)
RBC: 5.15 MIL/uL — ABNORMAL HIGH (ref 3.87–5.11)
RDW: 14.2 % (ref 11.5–15.5)
WBC: 8.8 10*3/uL (ref 4.0–10.5)

## 2016-08-27 LAB — COMPREHENSIVE METABOLIC PANEL
ALBUMIN: 4.2 g/dL (ref 3.5–5.0)
ALT: 33 U/L (ref 14–54)
AST: 24 U/L (ref 15–41)
Alkaline Phosphatase: 114 U/L (ref 38–126)
Anion gap: 10 (ref 5–15)
BUN: 13 mg/dL (ref 6–20)
CHLORIDE: 103 mmol/L (ref 101–111)
CO2: 27 mmol/L (ref 22–32)
Calcium: 9.4 mg/dL (ref 8.9–10.3)
Creatinine, Ser: 0.89 mg/dL (ref 0.44–1.00)
GFR calc Af Amer: >60 mL/min (ref >60)
GFR calc non Af Amer: >60 mL/min (ref >60)
GLUCOSE: 96 mg/dL (ref 65–99)
POTASSIUM: 4.5 mmol/L (ref 3.5–5.1)
SODIUM: 140 mmol/L (ref 135–145)
TOTAL PROTEIN: 6.8 g/dL (ref 6.5–8.1)
Total Bilirubin: 0.7 mg/dL (ref 0.3–1.2)

## 2016-08-27 LAB — URINALYSIS, ROUTINE W REFLEX MICROSCOPIC
BILIRUBIN URINE: NEGATIVE
Bacteria, UA: NONE SEEN
GLUCOSE, UA: NEGATIVE mg/dL
Hgb urine dipstick: NEGATIVE
KETONES UR: NEGATIVE mg/dL
Nitrite: NEGATIVE
PH: 5 (ref 5.0–8.0)
PROTEIN: NEGATIVE mg/dL
Specific Gravity, Urine: 1.005 (ref 1.005–1.030)

## 2016-08-27 MED ORDER — IOPAMIDOL (ISOVUE-300) INJECTION 61%
100.0000 mL | Freq: Once | INTRAVENOUS | Status: AC | PRN
  Administered 2016-08-27: 100 mL via INTRAVENOUS

## 2016-08-27 MED ORDER — IOPAMIDOL (ISOVUE-300) INJECTION 61%
INTRAVENOUS | Status: AC
  Filled 2016-08-27: qty 100

## 2016-08-28 ENCOUNTER — Ambulatory Visit (HOSPITAL_COMMUNITY): Payer: 59

## 2016-08-28 ENCOUNTER — Telehealth: Payer: Self-pay

## 2016-08-28 LAB — ABO/RH: ABO/RH(D): O POS

## 2016-08-28 NOTE — Telephone Encounter (Signed)
Told Ms Pancake that the CT scan did not show any adenopathy or metastatic disease.  A small lung nodule was seen in the right lower lobe of the lung. It is highly likely to be bengin on the way the nodule looks. Per Melissa Cross,NP. Can discuss with Dr. Denman George at post op visit if any imaging  follow up needed for nodule.

## 2016-08-31 NOTE — Progress Notes (Signed)
Patient called and notified of time change for surgery and to arrive at [place] at 0945 am. NPO after midnight and take any medications as instructed at pre-op appointment. Patient verbalized understanding.

## 2016-09-01 ENCOUNTER — Ambulatory Visit (HOSPITAL_COMMUNITY)
Admission: RE | Admit: 2016-09-01 | Discharge: 2016-09-02 | Disposition: A | Discharge status: Home / Self Care | Payer: 59 | Source: Ambulatory Visit | Attending: Gynecologic Oncology | Admitting: Gynecologic Oncology

## 2016-09-01 ENCOUNTER — Telehealth: Payer: Self-pay | Admitting: *Deleted

## 2016-09-01 ENCOUNTER — Encounter (HOSPITAL_COMMUNITY): Payer: Self-pay | Admitting: Anesthesiology

## 2016-09-01 ENCOUNTER — Encounter (HOSPITAL_COMMUNITY)
Admission: RE | Disposition: A | Discharge status: Home / Self Care | Payer: Self-pay | Source: Ambulatory Visit | Attending: Gynecologic Oncology

## 2016-09-01 ENCOUNTER — Ambulatory Visit (HOSPITAL_COMMUNITY): Payer: 59 | Admitting: Anesthesiology

## 2016-09-01 DIAGNOSIS — Z800 Family history of malignant neoplasm of digestive organs: Secondary | ICD-10-CM | POA: Diagnosis not present

## 2016-09-01 DIAGNOSIS — Z87891 Personal history of nicotine dependence: Secondary | ICD-10-CM | POA: Insufficient documentation

## 2016-09-01 DIAGNOSIS — C541 Malignant neoplasm of endometrium: Secondary | ICD-10-CM | POA: Diagnosis not present

## 2016-09-01 DIAGNOSIS — Z803 Family history of malignant neoplasm of breast: Secondary | ICD-10-CM | POA: Diagnosis not present

## 2016-09-01 HISTORY — PX: LYMPH NODE BIOPSY: SHX201

## 2016-09-01 HISTORY — PX: ROBOTIC ASSISTED TOTAL HYSTERECTOMY WITH BILATERAL SALPINGO OOPHERECTOMY: SHX6086

## 2016-09-01 LAB — TYPE AND SCREEN
ABO/RH(D): O POS
ANTIBODY SCREEN: NEGATIVE

## 2016-09-01 SURGERY — ROBOTIC ASSISTED TOTAL HYSTERECTOMY WITH BILATERAL SALPINGO OOPHORECTOMY
Anesthesia: General

## 2016-09-01 MED ORDER — METOCLOPRAMIDE HCL 5 MG/ML IJ SOLN: 10.0000 mg | Freq: Once | INTRAMUSCULAR | Status: DC | PRN

## 2016-09-01 MED ORDER — ONDANSETRON HCL 4 MG/2ML IJ SOLN: 4.0000 mg | Freq: Four times a day (QID) | INTRAMUSCULAR | Status: DC | PRN

## 2016-09-01 MED ORDER — SCOPOLAMINE 1 MG/3DAYS TD PT72
MEDICATED_PATCH | TRANSDERMAL | Status: AC
  Filled 2016-09-01: qty 1

## 2016-09-01 MED ORDER — LIDOCAINE 2% (20 MG/ML) 5 ML SYRINGE
INTRAMUSCULAR | Status: DC | PRN
  Administered 2016-09-01: 100 mg via INTRAVENOUS

## 2016-09-01 MED ORDER — MIDAZOLAM HCL 2 MG/2ML IJ SOLN
INTRAMUSCULAR | Status: AC
Start: 2016-09-01 — End: 2016-09-01
  Filled 2016-09-01: qty 2

## 2016-09-01 MED ORDER — PROPOFOL 10 MG/ML IV BOLUS
INTRAVENOUS | Status: AC
  Filled 2016-09-01: qty 20

## 2016-09-01 MED ORDER — PROPOFOL 10 MG/ML IV BOLUS
INTRAVENOUS | Status: DC | PRN
  Administered 2016-09-01: 200 mg via INTRAVENOUS

## 2016-09-01 MED ORDER — LIDOCAINE 2% (20 MG/ML) 5 ML SYRINGE
INTRAMUSCULAR | Status: AC
  Filled 2016-09-01: qty 5

## 2016-09-01 MED ORDER — ONDANSETRON HCL 4 MG/2ML IJ SOLN
INTRAMUSCULAR | Status: AC
  Filled 2016-09-01: qty 2

## 2016-09-01 MED ORDER — MEPERIDINE HCL 50 MG/ML IJ SOLN: 6.2500 mg | INTRAMUSCULAR | Status: DC | PRN

## 2016-09-01 MED ORDER — DEXAMETHASONE SODIUM PHOSPHATE 10 MG/ML IJ SOLN
INTRAMUSCULAR | Status: DC | PRN
  Administered 2016-09-01: 10 mg via INTRAVENOUS

## 2016-09-01 MED ORDER — SUCCINYLCHOLINE CHLORIDE 200 MG/10ML IV SOSY
PREFILLED_SYRINGE | INTRAVENOUS | Status: DC | PRN
  Administered 2016-09-01: 100 mg via INTRAVENOUS

## 2016-09-01 MED ORDER — FENTANYL CITRATE (PF) 100 MCG/2ML IJ SOLN
INTRAMUSCULAR | Status: DC | PRN
  Administered 2016-09-01 (×5): 50 ug via INTRAVENOUS

## 2016-09-01 MED ORDER — LACTATED RINGERS IR SOLN
Status: DC | PRN
  Administered 2016-09-01: 300 mL

## 2016-09-01 MED ORDER — MIDAZOLAM HCL 5 MG/5ML IJ SOLN
INTRAMUSCULAR | Status: DC | PRN
  Administered 2016-09-01: 2 mg via INTRAVENOUS

## 2016-09-01 MED ORDER — ENOXAPARIN SODIUM 40 MG/0.4ML ~~LOC~~ SOLN
40.0000 mg | SUBCUTANEOUS | Status: DC
  Administered 2016-09-02: 40 mg via SUBCUTANEOUS
  Filled 2016-09-01: qty 0.4

## 2016-09-01 MED ORDER — GABAPENTIN 600 MG PO TABS
600.0000 mg | ORAL_TABLET | Freq: Every day | ORAL | Status: DC
  Filled 2016-09-01: qty 1

## 2016-09-01 MED ORDER — SCOPOLAMINE 1 MG/3DAYS TD PT72SCOPOLAMINE 1 MG/3DAYS
MEDICATED_PATCH | TRANSDERMAL | Status: DC | PRN
Start: 2016-09-01 — End: 2016-09-01
  Administered 2016-09-01: 1 via TRANSDERMAL

## 2016-09-01 MED ORDER — ENOXAPARIN SODIUM 40 MG/0.4ML ~~LOC~~ SOLN
40.0000 mg | SUBCUTANEOUS | Status: AC
  Administered 2016-09-01: 40 mg via SUBCUTANEOUS
  Filled 2016-09-01: qty 0.4

## 2016-09-01 MED ORDER — LACTATED RINGERS IV SOLN: INTRAVENOUS | Status: DC

## 2016-09-01 MED ORDER — OXYCODONE-ACETAMINOPHEN 5-325 MG PO TABS: 1.0000 | ORAL_TABLET | ORAL | Status: DC | PRN

## 2016-09-01 MED ORDER — MEPERIDINE HCL 50 MG/ML IJ SOLN
INTRAMUSCULAR | Status: AC
  Administered 2016-09-01: 50 mg
  Filled 2016-09-01: qty 1

## 2016-09-01 MED ORDER — ONDANSETRON HCL 4 MG/2ML IJ SOLN
INTRAMUSCULAR | Status: DC | PRN
  Administered 2016-09-01: 4 mg via INTRAVENOUS

## 2016-09-01 MED ORDER — SUGAMMADEX SODIUM 200 MG/2ML IV SOLN
INTRAVENOUS | Status: DC | PRN
  Administered 2016-09-01: 200 mg via INTRAVENOUS

## 2016-09-01 MED ORDER — DICLOFENAC SODIUM 50 MG PO TBEC
50.0000 mg | DELAYED_RELEASE_TABLET | Freq: Three times a day (TID) | ORAL | Status: DC
  Administered 2016-09-01 – 2016-09-02 (×3): 50 mg via ORAL
  Filled 2016-09-01 (×4): qty 1

## 2016-09-01 MED ORDER — ROCURONIUM BROMIDE 50 MG/5ML IV SOSY
PREFILLED_SYRINGE | INTRAVENOUS | Status: AC
  Filled 2016-09-01: qty 5

## 2016-09-01 MED ORDER — HYDROMORPHONE HCL 1 MG/ML IJ SOLN: 0.2000 mg | INTRAMUSCULAR | Status: DC | PRN

## 2016-09-01 MED ORDER — STERILE WATER FOR INJECTION IJ SOLN
INTRAMUSCULAR | Status: DC | PRN
  Administered 2016-09-01: 3 mL via SURGICAL_CAVITY

## 2016-09-01 MED ORDER — FENTANYL CITRATE (PF) 100 MCG/2ML IJ SOLN: 25.0000 ug | INTRAMUSCULAR | Status: DC | PRN

## 2016-09-01 MED ORDER — CEFAZOLIN SODIUM-DEXTROSE 2-4 GM/100ML-% IV SOLN
2.0000 g | INTRAVENOUS | Status: AC
  Administered 2016-09-01: 2 g via INTRAVENOUS
  Filled 2016-09-01: qty 100

## 2016-09-01 MED ORDER — ROCURONIUM BROMIDE 10 MG/ML (PF) SYRINGE
PREFILLED_SYRINGE | INTRAVENOUS | Status: DC | PRN
  Administered 2016-09-01: 50 mg via INTRAVENOUS

## 2016-09-01 MED ORDER — LACTATED RINGERS IV SOLN
INTRAVENOUS | Status: DC
  Administered 2016-09-01 (×2): via INTRAVENOUS

## 2016-09-01 MED ORDER — ONDANSETRON HCL 4 MG PO TABS: 4.0000 mg | ORAL_TABLET | Freq: Four times a day (QID) | ORAL | Status: DC | PRN

## 2016-09-01 MED ORDER — FENTANYL CITRATE (PF) 250 MCG/5ML IJ SOLN
INTRAMUSCULAR | Status: AC
  Filled 2016-09-01: qty 5

## 2016-09-01 MED ORDER — DEXAMETHASONE SODIUM PHOSPHATE 10 MG/ML IJ SOLN
INTRAMUSCULAR | Status: AC
  Filled 2016-09-01: qty 1

## 2016-09-01 MED ORDER — SUGAMMADEX SODIUM 200 MG/2ML IV SOLN
INTRAVENOUS | Status: AC
  Filled 2016-09-01: qty 2

## 2016-09-01 MED ORDER — GABAPENTIN 300 MG PO CAPS
600.0000 mg | ORAL_CAPSULE | Freq: Every day | ORAL | Status: AC
  Administered 2016-09-01: 600 mg via ORAL
  Filled 2016-09-01: qty 2

## 2016-09-01 MED ORDER — SUCCINYLCHOLINE CHLORIDE 200 MG/10ML IV SOSY
PREFILLED_SYRINGE | INTRAVENOUS | Status: AC
  Filled 2016-09-01: qty 10

## 2016-09-01 MED ORDER — KCL IN DEXTROSE-NACL 20-5-0.45 MEQ/L-%-% IV SOLN
INTRAVENOUS | Status: DC
  Administered 2016-09-01: 16:00:00 via INTRAVENOUS
  Filled 2016-09-01 (×2): qty 1000

## 2016-09-01 MED ORDER — METOCLOPRAMIDE HCL 5 MG/ML IJ SOLN
INTRAMUSCULAR | Status: DC | PRN
  Administered 2016-09-01: 10 mg via INTRAVENOUS

## 2016-09-01 MED ORDER — STERILE WATER FOR INJECTION IJ SOLN
INTRAMUSCULAR | Status: AC
  Filled 2016-09-01: qty 10

## 2016-09-01 MED ORDER — METOCLOPRAMIDE HCL 5 MG/ML IJ SOLN
INTRAMUSCULAR | Status: AC
  Filled 2016-09-01: qty 2

## 2016-09-01 SURGICAL SUPPLY — 62 items
APPLICATOR SURGIFLO ENDO (HEMOSTASIS) IMPLANT
BAG LAPAROSCOPIC 12 15 PORT 16 (BASKET) IMPLANT
BAG RETRIEVAL 12/15 (BASKET)
CHLORAPREP W/TINT 26ML (MISCELLANEOUS) ×3 IMPLANT
COVER BACK TABLE 60X90IN (DRAPES) ×3 IMPLANT
COVER SURGICAL LIGHT HANDLE (MISCELLANEOUS) ×3 IMPLANT
COVER TIP SHEARS 8 DVNC (MISCELLANEOUS) ×2 IMPLANT
COVER TIP SHEARS 8MM DA VINCI (MISCELLANEOUS) ×1
DERMABOND ADVANCED (GAUZE/BANDAGES/DRESSINGS) ×1
DERMABOND ADVANCED .7 DNX12 (GAUZE/BANDAGES/DRESSINGS) ×2 IMPLANT
DRAPE ARM DVNC X/XI (DISPOSABLE) ×8 IMPLANT
DRAPE COLUMN DVNC XI (DISPOSABLE) ×2 IMPLANT
DRAPE DA VINCI XI ARM (DISPOSABLE) ×4
DRAPE DA VINCI XI COLUMN (DISPOSABLE) ×1
DRAPE SHEET LG 3/4 BI-LAMINATE (DRAPES) ×6 IMPLANT
DRAPE SURG IRRIG POUCH 19X23 (DRAPES) ×3 IMPLANT
ELECT REM PT RETURN 15FT ADLT (MISCELLANEOUS) ×3 IMPLANT
GLOVE BIO SURGEON STRL SZ 6 (GLOVE) IMPLANT
GLOVE BIO SURGEON STRL SZ 6.5 (GLOVE) IMPLANT
GLOVE BIOGEL PI IND STRL 6 (GLOVE) ×8 IMPLANT
GLOVE BIOGEL PI INDICATOR 6 (GLOVE) ×4
GLOVE SURG SS PI 6.5 STRL IVOR (GLOVE) ×6 IMPLANT
GOWN STRL REUS W/ TWL LRG LVL3 (GOWN DISPOSABLE) ×4 IMPLANT
GOWN STRL REUS W/TWL LRG LVL3 (GOWN DISPOSABLE) ×2
HOLDER FOLEY CATH W/STRAP (MISCELLANEOUS) ×3 IMPLANT
IRRIG SUCT STRYKERFLOW 2 WTIP (MISCELLANEOUS) ×3
IRRIGATION SUCT STRKRFLW 2 WTP (MISCELLANEOUS) ×2 IMPLANT
KIT BASIN OR (CUSTOM PROCEDURE TRAY) ×3 IMPLANT
KIT PROCEDURE DA VINCI SI (MISCELLANEOUS) ×1
KIT PROCEDURE DVNC SI (MISCELLANEOUS) ×2 IMPLANT
MANIPULATOR UTERINE 4.5 ZUMI (MISCELLANEOUS) ×3 IMPLANT
MARKER SKIN DUAL TIP RULER LAB (MISCELLANEOUS) ×3 IMPLANT
NDL SAFETY ECLIPSE 18X1.5 (NEEDLE) ×2 IMPLANT
NEEDLE HYPO 18GX1.5 SHARP (NEEDLE) ×1
NEEDLE SPNL 18GX3.5 QUINCKE PK (NEEDLE) ×3 IMPLANT
OBTURATOR OPTICAL STANDARD 8MM (TROCAR) ×1
OBTURATOR OPTICAL STND 8 DVNC (TROCAR) ×2
OBTURATOR OPTICALSTD 8 DVNC (TROCAR) ×2 IMPLANT
OCCLUDER COLPOPNEUMO (BALLOONS) ×3 IMPLANT
PAD POSITIONING PINK XL (MISCELLANEOUS) ×3 IMPLANT
PORT ACCESS TROCAR AIRSEAL 12 (TROCAR) ×2 IMPLANT
PORT ACCESS TROCAR AIRSEAL 5M (TROCAR) ×1
POUCH SPECIMEN RETRIEVAL 10MM (ENDOMECHANICALS) IMPLANT
SEAL CANN UNIV 5-8 DVNC XI (MISCELLANEOUS) ×8 IMPLANT
SEAL XI 5MM-8MM UNIVERSAL (MISCELLANEOUS) ×4
SET TRI-LUMEN FLTR TB AIRSEAL (TUBING) ×3 IMPLANT
SHEET LAVH (DRAPES) ×3 IMPLANT
SOLUTION ELECTROLUBE (MISCELLANEOUS) ×3 IMPLANT
SURGIFLO W/THROMBIN 8M KIT (HEMOSTASIS) IMPLANT
SUT MNCRL AB 4-0 PS2 18 (SUTURE) ×6 IMPLANT
SUT VIC AB 0 CT1 27 (SUTURE) ×1
SUT VIC AB 0 CT1 27XBRD ANTBC (SUTURE) ×2 IMPLANT
SYR 10ML LL (SYRINGE) ×3 IMPLANT
SYR 50ML LL SCALE MARK (SYRINGE) ×3 IMPLANT
TOWEL OR 17X26 10 PK STRL BLUE (TOWEL DISPOSABLE) ×3 IMPLANT
TOWEL OR NON WOVEN STRL DISP B (DISPOSABLE) ×3 IMPLANT
TRAP SPECIMEN MUCOUS 40CC (MISCELLANEOUS) IMPLANT
TRAY FOLEY W/METER SILVER 16FR (SET/KITS/TRAYS/PACK) IMPLANT
TRAY LAPAROSCOPIC (CUSTOM PROCEDURE TRAY) ×3 IMPLANT
TROCAR BLADELESS OPT 5 100 (ENDOMECHANICALS) ×3 IMPLANT
UNDERPAD 30X30 (UNDERPADS AND DIAPERS) ×3 IMPLANT
WATER STERILE IRR 1000ML POUR (IV SOLUTION) ×3 IMPLANT

## 2016-09-01 NOTE — Anesthesia Preprocedure Evaluation (Addendum)
Anesthesia Evaluation  Patient identified by MRN, date of birth, ID band Patient awake    Reviewed: Allergy & Precautions, NPO status , Patient's Chart, lab work & pertinent test results  Airway Mallampati: II  TM Distance: >3 FB Neck ROM: Full    Dental no notable dental hx.    Pulmonary neg pulmonary ROS, former smoker,    Pulmonary exam normal breath sounds clear to auscultation       Cardiovascular negative cardio ROS Normal cardiovascular exam Rhythm:Regular Rate:Normal     Neuro/Psych negative neurological ROS  negative psych ROS   GI/Hepatic negative GI ROS, Neg liver ROS,   Endo/Other  negative endocrine ROS  Renal/GU negative Renal ROS  negative genitourinary   Musculoskeletal negative musculoskeletal ROS (+)   Abdominal   Peds negative pediatric ROS (+)  Hematology negative hematology ROS (+)   Anesthesia Other Findings   Reproductive/Obstetrics negative OB ROS                             Anesthesia Physical Anesthesia Plan  ASA: I  Anesthesia Plan: General   Post-op Pain Management:    Induction: Intravenous  PONV Risk Score and Plan: 3 and Ondansetron, Dexamethasone, Propofol and Midazolam  Airway Management Planned: Oral ETT  Additional Equipment:   Intra-op Plan:   Post-operative Plan: Extubation in OR  Informed Consent: I have reviewed the patients History and Physical, chart, labs and discussed the procedure including the risks, benefits and alternatives for the proposed anesthesia with the patient or authorized representative who has indicated his/her understanding and acceptance.   Dental advisory given  Plan Discussed with: CRNA  Anesthesia Plan Comments:         Anesthesia Quick Evaluation

## 2016-09-01 NOTE — Interval H&P Note (Signed)
History and Physical Interval Note:  09/01/2016 10:24 AM  Danielle Guzman  has presented today for surgery, with the diagnosis of ENDOMETRIAL CANCER  The various methods of treatment have been discussed with the patient and family. After consideration of risks, benefits and other options for treatment, the patient has consented to  Procedure(s): ROBOTIC ASSISTED TOTAL HYSTERECTOMY WITH BILATERAL SALPINGO OOPHORECTOMY (Bilateral) LYMPH NODE BIOPSY (N/A) as a surgical intervention .  The patient's history has been reviewed, patient examined, no change in status, stable for surgery.  I have reviewed the patient's chart and labs. Her CT scan showed no obvious metastatic disease. Questions were answered to the patient's satisfaction.     Donaciano Eva

## 2016-09-01 NOTE — H&P (View-Only) (Signed)
Consult Note: Gyn-Onc  Consult was requested by Dr. Loletta Specter and Dr Stann Mainland for the evaluation of Danielle Guzman 51 y.o. female  CC:  Chief Complaint  Patient presents with  . Endometrial cancer    New patient    Assessment/Plan:  Danielle Guzman  is a 51 y.o.  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  A detailed discussion was held with the patient and her family with regard to to her endometrial cancer diagnosis. We discussed the standard management options for uterine cancer which includes surgery followed possibly by adjuvant therapy depending on the results of surgery. The options for surgical management include a hysterectomy and removal of the tubes and ovaries possibly with removal of pelvic and para-aortic lymph nodes.If feasible, a minimally invasive approach including a robotic hysterectomy or laparoscopic hysterectomy have benefits including shorter hospital stay, recovery time and better wound healing than with open surgery. The patient has been counseled about these surgical options and the risks of surgery in general including infection, bleeding, damage to surrounding structures (including bowel, bladder, ureters, nerves or vessels), and the postoperative risks of PE/ DVT, and lymphedema. I extensively reviewed the additional risks of robotic hysterectomy including possible need for conversion to open laparotomy.  I discussed positioning during surgery of trendelenberg and risks of minor facial swelling and care we take in preoperative positioning.  After counseling and consideration of her options, she desires to proceed with robotic assisted total hysterectomy with bilateral sapingo-oophorectomy and SLN biopsy.   I discussed that there is an increased risk for metastatic disease with high grade cancers, therefore we will order a preoperative CT abdo/pelvis to better prepare surgically.  Additionally, if clear cell/serous  carcinoma is confirmed on final pathology, she will likely be recommended to have adjuvant therapy with systemic therapy (chemotherapy) as these cancers are associated with a high risk for distant relapse even in early stage disease.  She will be seen by anesthesia for preoperative clearance and discussion of postoperative pain management.  She was given the opportunity to ask questions, which were answered to her satisfaction, and she is agreement with the above mentioned plan of care.  She has a very strong history of family members (particularly maternal) with cancer. We will test her hysterectomy specimen for MSI and refer regardless to genetics postop for further evaluation.   HPI: Danielle Guzman is a 51 year old [id number] who is seen in consultation at the request of Dr Stann Mainland and Dr Jerelyn Charles for endometrial cancer.  The patient reports early menopause at age 36 (all of her female relatives have had early menopause). She began experiencing postmenopausal bleeding in March 2018. She was seen by Dr Stann Mainland on 06/02/16 and a TVUS showed an endometrium of 1.8cm with a 3cm hypervascular polyp.  She was taken to the OR on 08/11/16 for a hysteroscopy/D&C with Dr Jerelyn Charles and the specimen showed endometrial adenocarcinoma with the majority of the specimen revealing grade 2 endometrioid endometrial cancer with a small focus (5%) of clear cell carcinoma and a few foci suspicious for serous carcinoma.  She is otherwise very healthy. She has had no prior surgeries or vaginal deliveries.  Her mother has a history of breast cancer x 3, she has a sister with a history of cervical cancer. Her maternal aunt has a history of breast cancer. A different maternal aunt had uterine and colon cancer. Her maternal cousin had uterine cancer. Her paternal grandmother  had a diagnosis of colon cancer and her paternal cousins had hysterectomies for "abnromal cells" but no cancer.  Current Meds:  Outpatient Encounter  Prescriptions as of 08/24/2016  Medication Sig  . Ascorbic Acid (VITAMIN C) 100 MG tablet Take 100 mg by mouth 3 (three) times daily as needed (cold symptoms).   No facility-administered encounter medications on file as of 08/24/2016.     Allergy:  Allergies  Allergen Reactions  . Latex     Long exposure causes a rash  . Tamiflu  [Oseltamivir Phosphate] Nausea Only    Social Hx:   Social History   Social History  . Marital status: Married    Spouse name: N/A  . Number of children: N/A  . Years of education: N/A   Occupational History  . Not on file.   Social History Main Topics  . Smoking status: Former Smoker    Years: 8.00    Types: Cigarettes  . Smokeless tobacco: Never Used     Comment: quit smoking at age 58  . Alcohol use 2.4 oz/week    4 Glasses of wine per week     Comment: wine  . Drug use: No  . Sexual activity: Yes    Birth control/ protection: Post-menopausal   Other Topics Concern  . Not on file   Social History Narrative  . No narrative on file    Past Surgical Hx:  Past Surgical History:  Procedure Laterality Date  . DENTAL SURGERY     gum graft 20 yrs ago and again on 08/26/16  . DILATATION & CURETTAGE/HYSTEROSCOPY WITH MYOSURE N/A 08/11/2016   Procedure: DILATATION & CURETTAGE/HYSTEROSCOPY WITH MYOSURE;  Surgeon: Jerelyn Charles, MD;  Location: [facility];  Service: Gynecology;  Laterality: N/A;  polyp removal   . WISDOM TOOTH EXTRACTION      Past Medical Hx:  Past Medical History:  Diagnosis Date  . Missed ab    x 2 - no surgery required     Past Gynecological History:  Premature menopause No LMP recorded. Patient is postmenopausal.  Family Hx:  Family History  Problem Relation Age of Onset  . Cancer Mother   . Cancer Sister        Cervix  . Cancer Maternal Aunt        Breast, uterine  . Cancer Paternal Grandmother        Colon    Review of Systems:  Constitutional  Feels well,    ENT Normal appearing ears and nares  bilaterally Skin/Breast  No rash, sores, jaundice, itching, dryness Cardiovascular  No chest pain, shortness of breath, or edema  Pulmonary  No cough or wheeze.  Gastro Intestinal  No nausea, vomitting, or diarrhoea. No bright red blood per rectum, no abdominal pain, change in bowel movement, or constipation.  Genito Urinary  No frequency, urgency, dysuria, +postmenopausal bleeding, +dyspareunia Musculo Skeletal  No myalgia, arthralgia, joint swelling or pain  Neurologic  No weakness, numbness, change in gait,  Psychology  No depression, anxiety, insomnia.   Vitals:  Blood pressure 120/67, pulse 90, temperature 98.7 F (37.1 C), temperature source Oral, resp. rate 18, height 5' 5.28" (1.658 m), weight 207 lb 1.6 oz (93.9 kg).  Physical Exam: WD in NAD Neck  Supple NROM, without any enlargements.  Lymph Node Survey No cervical supraclavicular or inguinal adenopathy Cardiovascular  Pulse normal rate, regularity and rhythm. S1 and S2 normal.  Lungs  Clear to auscultation bilateraly, without wheezes/crackles/rhonchi. Good air movement.  Skin  No  rash/lesions/breakdown  Psychiatry  Alert and oriented to person, place, and time  Abdomen  Normoactive bowel sounds, abdomen soft, non-tender and obese without evidence of hernia.  Back No CVA tenderness Genito Urinary  Vulva/vagina: Normal external female genitalia.   No lesions. No discharge or bleeding.  Bladder/urethra:  No lesions or masses, well supported bladder  Vagina: normal  Cervix: Normal appearing, no lesions.  Uterus:  Small, mobile, no parametrial involvement or nodularity.  Adnexa: no palpable masses. Rectal  deferred Extremities  No bilateral cyanosis, clubbing or edema.   Donaciano Eva, MD  08/24/2016, 12:07 PM

## 2016-09-01 NOTE — Transfer of Care (Signed)
Immediate Anesthesia Transfer of Care Note  Patient: Danielle Guzman  Procedure(s) Performed: Procedure(s): ROBOTIC ASSISTED TOTAL HYSTERECTOMY WITH BILATERAL SALPINGO OOPHORECTOMY (Bilateral) SENTINEL LYMPH NODE BIOPSY (N/A)  Patient Location: PACU  Anesthesia Type:General  Level of Consciousness:  sedated, patient cooperative and responds to stimulation  Airway & Oxygen Therapy:Patient Spontanous Breathing and Patient connected to face mask oxgen  Post-op Assessment:  Report given to PACU RN and Post -op Vital signs reviewed and stable  Post vital signs:  Reviewed and stable  Last Vitals:  Vitals:   09/01/16 0929  BP: 126/68  Pulse: 67  Resp: 16  Temp: 24.0 C    Complications: No apparent anesthesia complications

## 2016-09-01 NOTE — Anesthesia Procedure Notes (Signed)
Procedure Name: Intubation Date/Time: 09/01/2016 11:18 AM Performed by: Shameer Molstad, Virgel Gess Pre-anesthesia Checklist: Patient identified, Emergency Drugs available, Suction available and Patient being monitored Patient Re-evaluated:Patient Re-evaluated prior to inductionOxygen Delivery Method: Circle System Utilized Preoxygenation: Pre-oxygenation with 100% oxygen Intubation Type: IV induction Ventilation: Mask ventilation without difficulty Laryngoscope Size: Miller and 2 Grade View: Grade I Tube type: Oral Tube size: 7.0 mm Number of attempts: 1 Airway Equipment and Method: Stylet and Oral airway Placement Confirmation: ETT inserted through vocal cords under direct vision,  positive ETCO2 and breath sounds checked- equal and bilateral Secured at: 21 cm Tube secured with: Tape Dental Injury: Teeth and Oropharynx as per pre-operative assessment  Comments: Intubation performed by Julianne Rice, SRNA

## 2016-09-01 NOTE — Discharge Instructions (Signed)
09/01/2016  Return to work: 4 weeks  Activity: 1. Be up and out of the bed during the day.  Take a nap if needed.  You may walk up steps but be careful and use the hand rail.  Stair climbing will tire you more than you think, you may need to stop part way and rest.   2. No lifting or straining for 6 weeks.  3. No driving for 1 weeks.  Do Not drive if you are taking narcotic pain medicine.  4. Shower daily.  Use soap and water on your incision and pat dry; don't rub.   5. No sexual activity and nothing in the vagina for 8 weeks.  Medications:  - Take ibuprofen and tylenol first line for pain control. Take these regularly (every 6 hours) to decrease the build up of pain.  - If necessary, for severe pain not relieved by ibuprofen, take percocet.  - While taking percocet you should take sennakot every night to reduce the likelihood of constipation. If this causes diarrhea, stop its use.  Diet: 1. Low sodium Heart Healthy Diet is recommended.  2. It is safe to use a laxative if you have difficulty moving your bowels.   Wound Care: 1. Keep clean and dry.  Shower daily.  Reasons to call the Doctor:   Fever - Oral temperature greater than 100.4 degrees Fahrenheit  Foul-smelling vaginal discharge  Difficulty urinating  Nausea and vomiting  Increased pain at the site of the incision that is unrelieved with pain medicine.  Difficulty breathing with or without chest pain  New calf pain especially if only on one side  Sudden, continuing increased vaginal bleeding with or without clots.   Follow-up: 1. See Everitt Amber in 3 weeks.  Contacts: For questions or concerns you should contact:  Dr. Everitt Amber at [phone] After hours and on week-ends call [phone] and ask to speak to the physician on call for Gynecologic Oncology

## 2016-09-01 NOTE — Anesthesia Postprocedure Evaluation (Signed)
Anesthesia Post Note  Patient: Danielle Guzman  Procedure(s) Performed: Procedure(s) (LRB): ROBOTIC ASSISTED TOTAL HYSTERECTOMY WITH BILATERAL SALPINGO OOPHORECTOMY (Bilateral) SENTINEL LYMPH NODE BIOPSY (N/A)     Patient location during evaluation: PACU Anesthesia Type: General Level of consciousness: awake and alert Pain management: pain level controlled Vital Signs Assessment: post-procedure vital signs reviewed and stable Respiratory status: spontaneous breathing, nonlabored ventilation, respiratory function stable and patient connected to nasal cannula oxygen Cardiovascular status: blood pressure returned to baseline and stable Postop Assessment: no signs of nausea or vomiting Anesthetic complications: no    Last Vitals:  Vitals:   09/01/16 1419 09/01/16 1522  BP: 127/70 134/75  Pulse: (!) 55 60  Resp: 16 16  Temp: 36.6 C 36.6 C    Last Pain:  Vitals:   09/01/16 1530  TempSrc:   PainSc: 3                  Montez Hageman

## 2016-09-01 NOTE — Telephone Encounter (Signed)
Scheduled post op appt and the patient will receive the appt with the discharge summary

## 2016-09-01 NOTE — Op Note (Signed)
OPERATIVE NOTE 09/01/16  Surgeon: Donaciano Eva   Assistants: Dr Lahoma Crocker (an MD assistant was necessary for tissue manipulation, management of robotic instrumentation, retraction and positioning due to the complexity of the case and hospital policies).   Anesthesia: General endotracheal anesthesia  ASA Class: 3   Pre-operative Diagnosis: high grade endometrial cancer  Post-operative Diagnosis: same  Operation: Robotic-assisted laparoscopic total hysterectomy with bilateral salpingoophorectomy, Sentinel lymph node biopsy  Surgeon: Donaciano Eva  Assistant Surgeon: Lahoma Crocker MD  Anesthesia: GET  Urine Output: 300  Operative Findings:  : 6cm normal appearing uterus, normal tubes and ovaries, no suspicious lymph nodes, no apparent peritoneal disease.  Estimated Blood Loss:  <25cc      Total IV Fluids: 800 ml         Specimens: uterus, cervix, tubes and ovaries, right obturator SLN, right external iliac SLn, left distal external iliac SLN, left proximal external iliac SLN         Complications:  None; patient tolerated the procedure well.         Disposition: PACU - hemodynamically stable.  Procedure Details  The patient was seen in the Holding Room. The risks, benefits, complications, treatment options, and expected outcomes were discussed with the patient.  The patient concurred with the proposed plan, giving informed consent.  The site of surgery properly noted/marked. The patient was identified as Danielle Guzman and the procedure verified as a Robotic-assisted hysterectomy with bilateral salpingo oophorectomy. A Time Out was held and the above information confirmed.  After induction of anesthesia, the patient was draped and prepped in the usual sterile manner. Pt was placed in supine position after anesthesia and draped and prepped in the usual sterile manner. The abdominal drape was placed after the CholoraPrep had been allowed to dry for 3  minutes.  Her arms were tucked to her side with all appropriate precautions.  The shoulders were stabilized with padded shoulder blocks applied to the acromium processes.  The patient was placed in the semi-lithotomy position in [place].  The perineum was prepped with Betadine. The patient was then prepped. Foley catheter was placed.  A sterile speculum was placed in the vagina.  The cervix was grasped with a single-tooth tenaculum and dilated with Kennon Rounds dilators. 1mg  total of ICG was injected into the cervical stroma at 2 and 9 o'clock at a 48mm depth (concentration 0..5mg /ml). These SLN's were separated from their surrounding lymphatic tissue, removed and sent for permanent pathology.  The ZUMI uterine manipulator with a medium colpotomizer ring was placed without difficulty.  A pneum occluder balloon was placed over the manipulator.  OG tube placement was confirmed and to suction.   Next, a 5 mm skin incision was made 1 cm below the subcostal margin in the midclavicular line.  The 5 mm Optiview port and scope was used for direct entry.  Opening pressure was under 10 mm CO2.  The abdomen was insufflated and the findings were noted as above.   At this point and all points during the procedure, the patient's intra-abdominal pressure did not exceed 15 mmHg. Next, a 10 mm skin incision was made in the umbilicus and a right and left port was placed about 10 cm lateral to the robot port on the right and left side.  A fourth arm was placed in the left lower quadrant 2 cm above and superior and medial to the anterior superior iliac spine.  All ports were placed under direct visualization.  The patient  was placed in steep Trendelenburg.  Bowel was folded away into the upper abdomen.  The robot was docked in the normal manner.  The right and left peritoneum were opened parallel to the IP ligament to open the retroperitoneal spaces bilaterally. The SLN mapping was performed in bilateral pelvic basins. The para  rectal and paravesical spaces were opened up. Lymphatic channels were identified travelling to the following visualized sentinel lymph node's: right external iliac and right obturator SLN, left distal and proximal external iliac SLN. These SLN's were separated from their surrounding lymphatic tissue, removed and sent for permanent pathology.  The hysterectomy was started after the round ligament on the right side was incised and the retroperitoneum was entered and the pararectal space was developed.  The ureter was noted to be on the medial leaf of the broad ligament.  The peritoneum above the ureter was incised and stretched and the infundibulopelvic ligament was skeletonized, cauterized and cut.  The posterior peritoneum was taken down to the level of the KOH ring.  The anterior peritoneum was also taken down.  The bladder flap was created to the level of the KOH ring.  The uterine artery on the right side was skeletonized, cauterized and cut in the normal manner.  A similar procedure was performed on the left.  The colpotomy was made and the uterus, cervix, bilateral ovaries and tubes were amputated and delivered through the vagina.  Pedicles were inspected and excellent hemostasis was achieved.    The colpotomy at the vaginal cuff was closed with Vicryl on a CT1 needle in a running manner.  Irrigation was used and excellent hemostasis was achieved.  At this point in the procedure was completed.  Robotic instruments were removed under direct visulaization.  The robot was undocked. The 10 mm ports were closed with Vicryl on a UR-5 needle and the fascia was closed with 0 Vicryl on a UR-5 needle.  The skin was closed with 4-0 Vicryl in a subcuticular manner.  Dermabond was applied.  Sponge, lap and needle counts correct x 2.  The patient was taken to the recovery room in stable condition.  The vagina was swabbed with  minimal bleeding noted.   All instrument and needle counts were correct x  3.   The patient  was transferred to the recovery room in a stable condition.  Donaciano Eva, MD

## 2016-09-02 ENCOUNTER — Encounter (HOSPITAL_COMMUNITY): Payer: Self-pay | Admitting: Gynecologic Oncology

## 2016-09-02 DIAGNOSIS — C541 Malignant neoplasm of endometrium: Secondary | ICD-10-CM | POA: Diagnosis not present

## 2016-09-02 LAB — BASIC METABOLIC PANEL
ANION GAP: 8 (ref 5–15)
BUN: 7 mg/dL (ref 6–20)
CALCIUM: 8.8 mg/dL — AB (ref 8.9–10.3)
CO2: 26 mmol/L (ref 22–32)
Chloride: 103 mmol/L (ref 101–111)
Creatinine, Ser: 0.83 mg/dL (ref 0.44–1.00)
GFR calc Af Amer: >60 mL/min (ref >60)
GLUCOSE: 137 mg/dL — AB (ref 65–99)
POTASSIUM: 4.2 mmol/L (ref 3.5–5.1)
SODIUM: 137 mmol/L (ref 135–145)

## 2016-09-02 LAB — CBC
HCT: 37.3 % (ref 36.0–46.0)
Hemoglobin: 12.3 g/dL (ref 12.0–15.0)
MCH: 26.2 pg (ref 26.0–34.0)
MCHC: 33 g/dL (ref 30.0–36.0)
MCV: 79.4 fL (ref 78.0–100.0)
PLATELETS: 280 10*3/uL (ref 150–400)
RBC: 4.7 MIL/uL (ref 3.87–5.11)
RDW: 13.7 % (ref 11.5–15.5)
WBC: 12.7 10*3/uL — AB (ref 4.0–10.5)

## 2016-09-02 MED ORDER — OXYCODONE-ACETAMINOPHEN 5-325 MG PO TABS: 1.0000 | ORAL_TABLET | ORAL | 0 refills | Status: DC | PRN

## 2016-09-02 NOTE — Progress Notes (Signed)
09/02/16 1130  Reviewed discharge instructions with patient. Patient verbalized understanding of discharge instructions. Copy of discharge instructions and prescription given to patient.

## 2016-09-02 NOTE — Discharge Summary (Signed)
Physician Discharge Summary  Patient ID: Danielle Guzman MRN: [id number] DOB/AGE: 1965/08/21 51 y.o.  Admit date: 09/01/2016 Discharge date: 09/02/2016  Admission Diagnoses: Endometrial cancer [facility])  Discharge Diagnoses:  Principal Problem:   Endometrial cancer [facility])   Discharged Condition:  The patient is in good condition and stable for discharge.    Hospital Course: On 09/01/2016, the patient underwent the following: Procedure(s): ROBOTIC ASSISTED TOTAL HYSTERECTOMY WITH BILATERAL SALPINGO OOPHORECTOMY SENTINEL LYMPH NODE BIOPSY.   The postoperative course was uneventful.  She was discharged to home on postoperative day 1 tolerating a regular diet, ambulating, voiding, pain controlled.  Consults: None  Significant Diagnostic Studies: None  Treatments: surgery: see above  Discharge Exam: Blood pressure 114/61, pulse 69, temperature 98.2 F (36.8 C), temperature source Oral, resp. rate 16, height 5\' 6"  (1.676 m), weight 213 lb 10 oz (96.9 kg), SpO2 95 %. General appearance: alert, cooperative and no distress Resp: clear to auscultation bilaterally Cardio: regular rate and rhythm, S1, S2 normal, no murmur, click, rub or gallop GI: soft, non-tender; bowel sounds normal; no masses,  no organomegaly Extremities: extremities normal, atraumatic, no cyanosis or edema Incision/Wound: Lap sites to the abdomen without erythema or drainage, ecchymosis noted around left abdominal lap sites x3  Disposition: 01-Home or Self Care  Discharge Instructions    Call MD for:  difficulty breathing, headache or visual disturbances    Complete by:  As directed    Call MD for:  extreme fatigue    Complete by:  As directed    Call MD for:  hives    Complete by:  As directed    Call MD for:  persistant dizziness or light-headedness    Complete by:  As directed    Call MD for:  persistant nausea and vomiting    Complete by:  As directed    Call MD for:  redness, tenderness, or signs of infection  (pain, swelling, redness, odor or green/yellow discharge around incision site)    Complete by:  As directed    Call MD for:  severe uncontrolled pain    Complete by:  As directed    Call MD for:  temperature >100.4    Complete by:  As directed    Diet - low sodium heart healthy    Complete by:  As directed    Driving Restrictions    Complete by:  As directed    No driving for 1 week.  Do not take narcotics and drive.   Increase activity slowly    Complete by:  As directed    Lifting restrictions    Complete by:  As directed    No lifting greater than 10 lbs.   Sexual Activity Restrictions    Complete by:  As directed    No sexual activity, nothing in the vagina, for 8 weeks.     Allergies as of 09/02/2016      Reactions   Latex    Long exposure causes a rash   Tamiflu  [oseltamivir Phosphate] Nausea Only      Medication List    TAKE these medications   ketoprofen 75 MG capsule Commonly known as:  ORUDIS Take 75 mg by mouth 4 (four) times daily.   oxyCODONE-acetaminophen 5-325 MG tablet Commonly known as:  PERCOCET/ROXICET Take 1-2 tablets by mouth every 4 (four) hours as needed (moderate to severe pain).   VITAMIN C PO Take 1 tablet by mouth 3 (three) times daily as needed (for immune health support  or cold symptoms.).        Greater than thirty minutes were spend for face to face discharge instructions and discharge orders/summary in EPIC.   Signed: Doneshia Hill DEAL 09/02/2016, 9:18 AM

## 2016-09-08 ENCOUNTER — Telehealth: Payer: Self-pay | Admitting: Gynecologic Oncology

## 2016-09-08 NOTE — Telephone Encounter (Signed)
Left message to call back. Was calling to inform her of pathology results  - stage IIIA endometrial cancer. Recommend chemotherapy. Terrence Dupont

## 2016-09-10 ENCOUNTER — Telehealth: Payer: Self-pay | Admitting: Gynecologic Oncology

## 2016-09-10 ENCOUNTER — Other Ambulatory Visit: Payer: Self-pay | Admitting: Gynecologic Oncology

## 2016-09-10 DIAGNOSIS — C541 Malignant neoplasm of endometrium: Secondary | ICD-10-CM

## 2016-09-10 NOTE — Telephone Encounter (Signed)
Informed patient of stage IIIA cancer. Will discuss at tumor board due to concern that fallopian tube disease may be artifact (floaters) vs true mets.  Plan would be for chemotherpy 6 cycles carb/tax.

## 2016-09-11 ENCOUNTER — Telehealth: Payer: Self-pay | Admitting: *Deleted

## 2016-09-15 NOTE — Telephone Encounter (Signed)
Contacted the patient and gave appt for Dr. Alvy Bimler

## 2016-09-30 ENCOUNTER — Ambulatory Visit: Payer: 59 | Attending: Gynecologic Oncology | Admitting: Gynecologic Oncology

## 2016-09-30 ENCOUNTER — Encounter: Payer: Self-pay | Admitting: Hematology and Oncology

## 2016-09-30 ENCOUNTER — Encounter: Payer: Self-pay | Admitting: Gynecologic Oncology

## 2016-09-30 ENCOUNTER — Ambulatory Visit (HOSPITAL_BASED_OUTPATIENT_CLINIC_OR_DEPARTMENT_OTHER): Payer: 59 | Admitting: Hematology and Oncology

## 2016-09-30 VITALS — BP 116/53 | HR 67 | Temp 97.7°F | Resp 18 | Wt 204.4 lb

## 2016-09-30 DIAGNOSIS — Z803 Family history of malignant neoplasm of breast: Secondary | ICD-10-CM | POA: Insufficient documentation

## 2016-09-30 DIAGNOSIS — Z808 Family history of malignant neoplasm of other organs or systems: Secondary | ICD-10-CM

## 2016-09-30 DIAGNOSIS — Z87891 Personal history of nicotine dependence: Secondary | ICD-10-CM

## 2016-09-30 DIAGNOSIS — Z7189 Other specified counseling: Secondary | ICD-10-CM | POA: Diagnosis not present

## 2016-09-30 DIAGNOSIS — Z8049 Family history of malignant neoplasm of other genital organs: Secondary | ICD-10-CM | POA: Insufficient documentation

## 2016-09-30 DIAGNOSIS — Z800 Family history of malignant neoplasm of digestive organs: Secondary | ICD-10-CM

## 2016-09-30 DIAGNOSIS — C7982 Secondary malignant neoplasm of genital organs: Secondary | ICD-10-CM | POA: Insufficient documentation

## 2016-09-30 DIAGNOSIS — C541 Malignant neoplasm of endometrium: Secondary | ICD-10-CM

## 2016-09-30 DIAGNOSIS — Z809 Family history of malignant neoplasm, unspecified: Secondary | ICD-10-CM

## 2016-09-30 NOTE — Progress Notes (Signed)
[facility] NOTE  Patient Care Team: [facility], [facility] as PCP - General (Family Medicine)  CHIEF COMPLAINTS/PURPOSE OF CONSULTATION:  Endometrial cancer, to discuss potential adjuvant treatment  HISTORY OF PRESENTING ILLNESS:  Danielle Guzman 51 y.o. female is here because of recent diagnosis of endometrial cancer. The patient has been menopausal at an early age.  According to her, she has been menopausal for at least 15 years. She did not use HRT.  The patient has no children. She started to have postmenopausal bleeding recently.  She went to see her GYN physician who performed D&C.  When he came back abnormal with malignancy, she was subsequently referred to GYN surgeon for hysterectomy.  I review her records extensively and summarized as follows:   Endometrial cancer ([place])   08/11/2016 Procedure    Procedure: hysteroscopy, polypectomy and curettage with MyoSure       08/13/2016 Pathology Results    Endometrium, curettage, and endometrial polyp - ENDOMETRIAL CARCINOMA, SEE COMMENT. 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 There is a small focus (5%) of clear cell carcinoma and a few foci suspicious for serous carcinoma.       08/27/2016 Imaging    Ct scan of abdomen 1. No findings of adenopathy or metastatic disease in the abdomen or pelvis. 2. 2 by 3 mm peripheral right lower lobe pulmonary nodule on image 11/7. Highly likely to be benign based on morphology      09/01/2016 Pathology Results    1. Lymph node, sentinel, biopsy, right external iliac - ONE BENIGN LYMPH NODE (0/1). 2. Lymph node, sentinel, biopsy, right obturator - ONE BENIGN LYMPH NODE (0/1). 3. Lymph node, sentinel, biopsy, distal left external iliac - ONE BENIGN LYMPH NODE (0/1). 4. Lymph node, sentinel, biopsy, left proximal external iliac - ONE BENIGN LYMPH NODE (0/1). 5. Uterus +/-  tubes/ovaries, neoplastic, cervix - ENDOMETRIAL ADENOCARCINOMA. - LUMINAL INVOLVEMENT OF RIGHT AND LEFT FALLOPIAN TUBES. - CERVIX AND BILATERAL OVARIES FREE OF TUMOR. - SEE ONCOLOGY TABLE AND COMMENT. Microscopic Comment 1. - 4. Immunohistochemistry for cytokeratin AE1/AE3 is performed on all of the lymph nodes (parts 1-4) and no positivity is identified. 5. ONCOLOGY TABLE-UTERUS, CARCINOMA OR CARCINOSARCOMA  Specimen: Uterus with bilateral fallopian tubes and ovaries with right and left pelvic lymph nodes Procedure: Hysterectomy with bilateral salpingo-oophorectomy Lymph node sampling performed: Yes Specimen integrity: Intact Maximum tumor size: 1.8 cm Histologic type: Endometrial adenocarcinoma, see comment. Grade: II Myometrial invasion: 0 cm where myometrium is 1.1 cm in thickness Cervical stromal involvement: No Extent of involvement of other organs: Luminal involvement of right and left fallopian tubes, see comment. Lymph - vascular invasion: Not identified  Peritoneal washings: N/A Lymph nodes: Examined: 4 Sentinel 0 Non-sentinel 4 Total Lymph nodes with metastasis: 0 Isolated tumor cells (< 0.2 mm): 0 Micrometastasis: (> 0.2 mm and < 2.0 mm): 0 Macrometastasis: (> 2.0 mm): 0 Extracapsular extension: N/A Pelvic lymph nodes: 0 involved of 4 lymph nodes. Para-aortic lymph nodes: 0 involved of 0 lymph nodes. Other (specify involvement and site): N/A TNM code: pT3a, pN0 FIGO Stage (based on pathologic findings, needs clinical correlation): III-A  Comment: The endometrial tumor is a moderately differentiated mostly endometrioid adenocarcinoma with rare foci of squamous differentiation and a rare microscopic focus consistent with serous differentiation. There is also a microscopic focus with chondroid stroma which is associated with an adjacent microscopic serous component. This finding suggests a microscopic stromal component which  is less than 1 mm in greatest dimension. In  addition, there are luminal foci of carcinoma within the right and fallopian tubes and the right fallopian tube lumen involvement shows a focus of similar appearing chondroid stroma with adjacent microscopic focus of serous carcinoma.      09/01/2016 Surgery    Surgeon: Donaciano Eva   Operation: Robotic-assisted laparoscopic total hysterectomy with bilateral salpingoophorectomy, Sentinel lymph node biopsy  Operative Findings:  : 6cm normal appearing uterus, normal tubes and ovaries, no suspicious lymph nodes, no apparent peritoneal disease.       She is recovering well from her surgery.  She denies pain, nausea or changes in bowel habits.  MEDICAL HISTORY:  Past Medical History:  Diagnosis Date  . Cancer ([place]) 2018   endometrial   . Headache    hx migraines yrs ago  . Malaria as child  . Miscarriage    x 2  . Pneumonia yrs ago  . Seizures ([place])    petite mal seizure in high school x 1, none since    SURGICAL HISTORY: Past Surgical History:  Procedure Laterality Date  . DENTAL SURGERY     gum graft 20 yrs ago and again on 08/26/16  . DILATATION & CURETTAGE/HYSTEROSCOPY WITH MYOSURE N/A 08/11/2016   Procedure: DILATATION & CURETTAGE/HYSTEROSCOPY WITH MYOSURE;  Surgeon: Jerelyn Charles, MD;  Location: [facility];  Service: Gynecology;  Laterality: N/A;  polyp removal  . LYMPH NODE BIOPSY N/A 09/01/2016   Procedure: SENTINEL LYMPH NODE BIOPSY;  Surgeon: Everitt Amber, MD;  Location: [facility];  Service: Gynecology;  Laterality: N/A;  . ROBOTIC ASSISTED TOTAL HYSTERECTOMY WITH BILATERAL SALPINGO OOPHERECTOMY Bilateral 09/01/2016   Procedure: ROBOTIC ASSISTED TOTAL HYSTERECTOMY WITH BILATERAL SALPINGO OOPHORECTOMY;  Surgeon: Everitt Amber, MD;  Location: [facility];  Service: Gynecology;  Laterality: Bilateral;  . WISDOM TOOTH EXTRACTION      SOCIAL HISTORY: Social History   Social History  . Marital status: Married    Spouse name: [name]  . Number of children: 0  .  Years of education: N/A   Occupational History  . massage therapist    Social History Main Topics  . Smoking status: Former Smoker    Packs/day: 1.50    Years: 8.00    Types: Cigarettes  . Smokeless tobacco: Never Used     Comment: quit smoking at age 2  . Alcohol use 2.4 oz/week    4 Glasses of wine per week     Comment: wine occ  . Drug use: No  . Sexual activity: Yes    Birth control/ protection: Post-menopausal   Other Topics Concern  . Not on file   Social History Narrative  . No narrative on file    FAMILY HISTORY: Family History  Problem Relation Age of Onset  . Cancer Mother   . Cancer Sister        Cervix  . Cancer Maternal Aunt        Breast, uterine  . Cancer Paternal Grandmother        Colon    ALLERGIES:  is allergic to latex and tamiflu  [oseltamivir phosphate].  MEDICATIONS:  Current Outpatient Prescriptions  Medication Sig Dispense Refill  . Ascorbic Acid (VITAMIN C PO) Take 1 tablet by mouth 3 (three) times daily as needed (for immune health support or cold symptoms.).    Marland Kitchen ketoprofen (ORUDIS) 75 MG capsule Take 75 mg by mouth 4 (four) times daily.     No current facility-administered medications  for this visit.     REVIEW OF SYSTEMS:   Constitutional: Denies fevers, chills or abnormal night sweats Eyes: Denies blurriness of vision, double vision or watery eyes Ears, nose, mouth, throat, and face: Denies mucositis or sore throat Respiratory: Denies cough, dyspnea or wheezes Cardiovascular: Denies palpitation, chest discomfort or lower extremity swelling Gastrointestinal:  Denies nausea, heartburn or change in bowel habits Skin: Denies abnormal skin rashes Lymphatics: Denies new lymphadenopathy or easy bruising Neurological:Denies numbness, tingling or new weaknesses Behavioral/Psych: Mood is stable, no new changes  All other systems were reviewed with the patient and are negative.  PHYSICAL EXAMINATION: ECOG PERFORMANCE STATUS: 0 -  Asymptomatic  Vitals:   09/30/16 1416  BP: 124/67  Pulse: 62  Resp: 18  Temp: 97.7 F (36.5 C)   Filed Weights   09/30/16 1416  Weight: 204 lb 6.4 oz (92.7 kg)    GENERAL:alert, no distress and comfortable SKIN: skin color, texture, turgor are normal, no rashes or significant lesions EYES: normal, conjunctiva are pink and non-injected, sclera clear OROPHARYNX:no exudate, no erythema and lips, buccal mucosa, and tongue normal  NECK: supple, thyroid normal size, non-tender, without nodularity LYMPH:  no palpable lymphadenopathy in the cervical, axillary or inguinal LUNGS: clear to auscultation and percussion with normal breathing effort HEART: regular rate & rhythm and no murmurs and no lower extremity edema ABDOMEN:abdomen soft, non-tender and normal bowel sounds.  Noted well-healed surgical scar Musculoskeletal:no cyanosis of digits and no clubbing  PSYCH: alert & oriented x 3 with fluent speech NEURO: no focal motor/sensory deficits  LABORATORY DATA:  I have reviewed the data as listed Lab Results  Component Value Date   WBC 12.7 (H) 09/02/2016   HGB 12.3 09/02/2016   HCT 37.3 09/02/2016   MCV 79.4 09/02/2016   PLT 280 09/02/2016    Recent Labs  08/27/16 1422 09/02/16 0538  NA 140 137  K 4.5 4.2  CL 103 103  CO2 27 26  GLUCOSE 96 137*  BUN 13 7  CREATININE 0.89 0.83  CALCIUM 9.4 8.8*  GFRNONAA >60 >60  GFRAA >60 >60  PROT 6.8  --   ALBUMIN 4.2  --   AST 24  --   ALT 33  --   ALKPHOS 114  --   BILITOT 0.7  --     RADIOGRAPHIC STUDIES: I reviewed her CT scan I have personally reviewed the radiological images as listed and agreed with the findings in the report.   ASSESSMENT & PLAN:  Endometrial cancer ([place]) I have discussed significant discussion with GYN surgeon and the patient I reviewed the pathology report with her It is not clear whether she had locally advanced cancer versus early stage I cancer with abnormal cells seems as a result of her  D&C We will review her case at the next GYN oncology tumor board We will call the patient with decision Because of the high-grade pathology seen intermixed with endometrioid cancer, I discussed with the patient the role of adjuvant treatment I reviewed the current guidelines Adjuvant treatment will consist of combination chemotherapy with carboplatin and paclitaxel I think she would benefit from port placement upfront If the decision after tumor board is to pursue adjuvant treatment, I will schedule port placement, chemo education class, chemotherapy consent with plan to start her on chemotherapy soon If however, we felt that she has only early stage I disease and she does not require adjuvant treatment, I would defer to the GYN team for future follow-up.  We discussed some of the risks, benefits, side effects of combination chemotherapy with carboplatin and Taxol including potential hair loss, pancytopenia, peripheral neuropathy, nausea and fatigue.  I addressed all her questions and concerns    All questions were answered. The patient knows to call the clinic with any problems, questions or concerns. I spent 55 minutes counseling the patient face to face. The total time spent in the appointment was 60 minutes and more than 50% was on counseling.     Heath Lark, MD 09/30/2016 5:11 PM

## 2016-09-30 NOTE — Patient Instructions (Signed)
Dr Serita Grit office will contact you after the tumor board conference on 10/03/16 to discuss final recommendations. Follow-up appointments will be established based on your treatment preferences as stated at that time.

## 2016-09-30 NOTE — Progress Notes (Signed)
Consult Note: Gyn-Onc  Consult was requested by Dr. Loletta Specter and Dr Stann Mainland for the evaluation of Danielle Guzman 51 y.o. female  CC:  Chief Complaint  Patient presents with  . Endometrial cancer [facility])    Assessment/Plan:  Ms. Danielle Guzman  is a 51 y.o.  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  A detailed discussion was held with the patient and her friend regarding the findings of pathology.  It is unclear if the fallopian tube lesions are true metastases or "floaters" as it is very unusual to see metastases in a small size tumor with no myometrial invasion. Therefore we will review the pathology with the pathologists at multidisciplinary conference on 10/07/16.   If there is conviction that this is a stage IIIA cancer, my recommendation is for 6 cycles carboplatin and paclitaxel chemotherapy. I do not see a value for radiation given the small, noninvasive uterine speciment.  If there is suspicion that these are "floater" lesions, expectant management with close surveillance is an option.  I discussed that this is not a certain descrimination and will ultimately be based upon subjective interpretation. The most conservative approach would be to give chemotherapy regardless.  I discussed that the goal of adjuvant therapy was to prevent recurrence and optimize likelihood of cure, as, cure is usually not possible if treatment is delayed until recurrence is measurable.  The patient has a strong preference for "natural" therapies and may decline adjuvant therapy. She will meet with Dr Alvy Bimler later today for consultation.   HPI: Danielle Guzman is a 51 year old [id number] who is seen in consultation at the request of Dr Stann Mainland and Dr Jerelyn Charles for endometrial cancer.  The patient reports early menopause at age 31 (all of her female relatives have had early menopause). She began experiencing postmenopausal bleeding in March 2018. She was seen by Dr  Stann Mainland on 06/02/16 and a TVUS showed an endometrium of 1.8cm with a 3cm hypervascular polyp.  She was taken to the OR on 08/11/16 for a hysteroscopy/D&C with Dr Jerelyn Charles and the specimen showed endometrial adenocarcinoma with the majority of the specimen revealing grade 2 endometrioid endometrial cancer with a small focus (5%) of clear cell carcinoma and a few foci suspicious for serous carcinoma.  She is otherwise very healthy. She has had no prior surgeries or vaginal deliveries.  Her mother has a history of breast cancer x 3, she has a sister with a history of cervical cancer. Her maternal aunt has a history of breast cancer. A different maternal aunt had uterine and colon cancer. Her maternal cousin had uterine cancer. Her paternal grandmother had a diagnosis of colon cancer and her paternal cousins had hysterectomies for "abnromal cells" but no cancer.  Interval Hx: On 09/01/16 she underwent robotic assisted total hysterectomy, BSO, SLN biopsy. Surgery was uncomplicated. The final pathology revealed a 1.8cm grade 2 endometrioid endometrial cancer ( with rare foci of squamous differentiation and a rare microscopic focus of serous differentiation). There was luminal foci of carcinoma within the right and left fallopian tubes and the right fallopian tube lumen involvement showed a focus of similar appearing chondroid stroma with adjacent focus of serous carcinoma.  The lymph nodes and cervix were negative for carcinoma. The uterine tumor showed no myometrial invasion and no LVSI.  Postoperatively she has done well. She has some discomfort at the vaginal cuff with urination but no discharge or bleeding.  Current Meds:  Outpatient Encounter Prescriptions as of 09/30/2016  Medication Sig  . Ascorbic Acid (VITAMIN C PO) Take 1 tablet by mouth 3 (three) times daily as needed (for immune health support or cold symptoms.).  Marland Kitchen ketoprofen (ORUDIS) 75 MG capsule Take 75 mg by mouth 4 (four) times daily.   . [DISCONTINUED] oxyCODONE-acetaminophen (PERCOCET/ROXICET) 5-325 MG tablet Take 1-2 tablets by mouth every 4 (four) hours as needed (moderate to severe pain).   No facility-administered encounter medications on file as of 09/30/2016.     Allergy:  Allergies  Allergen Reactions  . Latex     Long exposure causes a rash  . Tamiflu  [Oseltamivir Phosphate] Nausea Only    Social Hx:   Social History   Social History  . Marital status: Married    Spouse name: N/A  . Number of children: N/A  . Years of education: N/A   Occupational History  . Not on file.   Social History Main Topics  . Smoking status: Former Smoker    Packs/day: 1.50    Years: 8.00    Types: Cigarettes  . Smokeless tobacco: Never Used     Comment: quit smoking at age 24  . Alcohol use 2.4 oz/week    4 Glasses of wine per week     Comment: wine occ  . Drug use: No  . Sexual activity: Yes    Birth control/ protection: Post-menopausal   Other Topics Concern  . Not on file   Social History Narrative  . No narrative on file    Past Surgical Hx:  Past Surgical History:  Procedure Laterality Date  . DENTAL SURGERY     gum graft 20 yrs ago and again on 08/26/16  . DILATATION & CURETTAGE/HYSTEROSCOPY WITH MYOSURE N/A 08/11/2016   Procedure: DILATATION & CURETTAGE/HYSTEROSCOPY WITH MYOSURE;  Surgeon: Jerelyn Charles, MD;  Location: [facility];  Service: Gynecology;  Laterality: N/A;  polyp removal  . LYMPH NODE BIOPSY N/A 09/01/2016   Procedure: SENTINEL LYMPH NODE BIOPSY;  Surgeon: Everitt Amber, MD;  Location: [facility];  Service: Gynecology;  Laterality: N/A;  . ROBOTIC ASSISTED TOTAL HYSTERECTOMY WITH BILATERAL SALPINGO OOPHERECTOMY Bilateral 09/01/2016   Procedure: ROBOTIC ASSISTED TOTAL HYSTERECTOMY WITH BILATERAL SALPINGO OOPHORECTOMY;  Surgeon: Everitt Amber, MD;  Location: [facility];  Service: Gynecology;  Laterality: Bilateral;  . WISDOM TOOTH EXTRACTION      Past Medical Hx:  Past Medical History:  Diagnosis  Date  . Cancer ([place]) 2018   endometrial   . Headache    hx migraines yrs ago  . Malaria as child  . Miscarriage    x 2  . Pneumonia yrs ago  . 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 Patient is postmenopausal.  Family Hx:  Family History  Problem Relation Age of Onset  . Cancer Mother   . Cancer Sister        Cervix  . Cancer Maternal Aunt        Breast, uterine  . Cancer Paternal Grandmother        Colon    Review of Systems:  Constitutional  Feels well,    ENT Normal appearing ears and nares bilaterally Skin/Breast  No rash, sores, jaundice, itching, dryness Cardiovascular  No chest pain, shortness of breath, or edema  Pulmonary  No cough or wheeze.  Gastro Intestinal  No nausea, vomitting, or diarrhoea. No bright red  blood per rectum, no abdominal pain, change in bowel movement, or constipation.  Genito Urinary  No frequency, urgency, dysuria,  Musculo Skeletal  No myalgia, arthralgia, joint swelling or pain  Neurologic  No weakness, numbness, change in gait,  Psychology  No depression, anxiety, insomnia.   Vitals:  Blood pressure (!) 116/53, pulse 67, temperature 97.7 F (36.5 C), temperature source Oral, resp. rate 18, weight 204 lb 6.4 oz (92.7 kg), SpO2 99 %.  Physical Exam: WD in NAD Neck  Supple NROM, without any enlargements.  Lymph Node Survey No cervical supraclavicular or inguinal adenopathy Cardiovascular  Pulse normal rate, regularity and rhythm. S1 and S2 normal.  Lungs  Clear to auscultation bilateraly, without wheezes/crackles/rhonchi. Good air movement.  Skin  No rash/lesions/breakdown  Psychiatry  Alert and oriented to person, place, and time  Abdomen  Normoactive bowel sounds, abdomen soft, non-tender and obese without evidence of hernia. Well healed incisions Back No CVA tenderness Genito Urinary  : vaginal cuff well healed and  in tact. Small spot of bleeding centrally with silver nitrate applied. Rectal  deferred Extremities  No bilateral cyanosis, clubbing or edema.   30 minutes of direct face to face counseling time was spent with the patient. This included discussion about prognosis, therapy recommendations and postoperative side effects and are beyond the scope of routine postoperative care.   Donaciano Eva, MD  09/30/2016, 1:52 PM

## 2016-09-30 NOTE — Assessment & Plan Note (Signed)
I have discussed significant discussion with GYN surgeon and the patient I reviewed the pathology report with her It is not clear whether she had locally advanced cancer versus early stage I cancer with abnormal cells seems as a result of her D&C We will review her case at the next GYN oncology tumor board We will call the patient with decision Because of the high-grade pathology seen intermixed with endometrioid cancer, I discussed with the patient the role of adjuvant treatment I reviewed the current guidelines Adjuvant treatment will consist of combination chemotherapy with carboplatin and paclitaxel I think she would benefit from port placement upfront If the decision after tumor board is to pursue adjuvant treatment, I will schedule port placement, chemo education class, chemotherapy consent with plan to start her on chemotherapy soon If however, we felt that she has only early stage I disease and she does not require adjuvant treatment, I would defer to the GYN team for future follow-up.   We discussed some of the risks, benefits, side effects of combination chemotherapy with carboplatin and Taxol including potential hair loss, pancytopenia, peripheral neuropathy, nausea and fatigue.  I addressed all her questions and concerns

## 2016-10-05 ENCOUNTER — Telehealth: Payer: Self-pay | Admitting: Hematology and Oncology

## 2016-10-05 ENCOUNTER — Telehealth: Payer: Self-pay | Admitting: Gynecologic Oncology

## 2016-10-05 NOTE — Telephone Encounter (Signed)
I will try to call her again later today. If she declines chemo, I will not schedule follow-up

## 2016-10-05 NOTE — Telephone Encounter (Signed)
I have a long discussion with the patient over the telephone. I review recommendations from tumor board and explained to her why adjuvant chemotherapy is recommended We also discussed referral to tertiary center.  She is interested to pursue second opinion at the [facility] and would like to try holistic approach. She would like to attend chemotherapy class to understand more about side effects of chemotherapy I do not recommend combining herbal supplement during chemotherapy but I am willing to consider non-medication approach such as healthy diet or fruit juices during treatment The patient states that she is taking care of her mother and will not be able to start chemo at least until August 13th. She will call me after she has further discussion with her husband

## 2016-10-05 NOTE — Telephone Encounter (Signed)
Informed patient of conclusions of tumor board conference that the fallopian tube conference that the fallopian tube and recommendations are for 6 cycles of carboplatin and paclitaxel. Patient still considering her options including "holistic" care.

## 2016-10-05 NOTE — Telephone Encounter (Signed)
sw pt to confirm chemo class 7/27 at 0930 per sch msg

## 2016-10-06 ENCOUNTER — Encounter: Payer: Self-pay | Admitting: Gynecologic Oncology

## 2016-10-06 NOTE — Progress Notes (Signed)
Gynecologic Oncology Multi-Disciplinary Disposition Conference Note  Date of the Conference: October 05, 2016  Patient Name: Danielle Guzman  Referring Provider: Dr. Carloyn Jaeger Dr. Stann Mainland Primary GYN Oncologist: Dr. Everitt Amber  Stage/Disposition:  Stage IIIA endometrioid adenocarcinoma with focal serous carcinoma. Disposition is to 6 cycles of carboplatin and taxol.  [facility] pending on path specimen.  Genetics referral to be discussed.     This Multidisciplinary conference took place involving physicians from [place], [place], Radiation Oncology, Pathology, Radiology along with the Gynecologic Oncology Nurse Practitioner and RN.  Comprehensive assessment of the patient's malignancy, staging, need for surgery, chemotherapy, radiation therapy, and need for further testing were reviewed. Supportive measures, both inpatient and following discharge were also discussed. The recommended plan of care is documented. Greater than 35 minutes were spent correlating and coordinating this patient's care.

## 2016-10-07 ENCOUNTER — Encounter: Payer: Self-pay | Admitting: *Deleted

## 2016-10-09 ENCOUNTER — Other Ambulatory Visit: Payer: 59

## 2016-10-12 ENCOUNTER — Encounter: Payer: Self-pay | Admitting: *Deleted

## 2016-10-20 ENCOUNTER — Telehealth: Payer: Self-pay | Admitting: Gynecologic Oncology

## 2016-10-20 NOTE — Telephone Encounter (Signed)
Returned call to patient.  All questions answered.  Patient asking questions about path and treatment.  Patient would still like to speak with Dr. Denman George.

## 2016-10-22 ENCOUNTER — Telehealth: Payer: Self-pay | Admitting: *Deleted

## 2016-10-22 NOTE — Telephone Encounter (Signed)
Per Dr. Denman George patient scheduled for August 15th at 4pm. Patient aware of the appt

## 2016-10-28 ENCOUNTER — Encounter: Payer: Self-pay | Admitting: Gynecologic Oncology

## 2016-10-28 ENCOUNTER — Ambulatory Visit: Payer: 59 | Attending: Gynecologic Oncology | Admitting: Gynecologic Oncology

## 2016-10-28 VITALS — BP 113/68 | HR 76 | Temp 98.1°F | Resp 20

## 2016-10-28 DIAGNOSIS — Z7189 Other specified counseling: Secondary | ICD-10-CM

## 2016-10-28 DIAGNOSIS — C7982 Secondary malignant neoplasm of genital organs: Secondary | ICD-10-CM

## 2016-10-28 DIAGNOSIS — C541 Malignant neoplasm of endometrium: Secondary | ICD-10-CM | POA: Diagnosis not present

## 2016-10-28 NOTE — Progress Notes (Signed)
Danielle Guzman is a 51 year old woman with stage IIIA serous endometrial cancer. She was recommended adjuvant chemotherapy with carb/tax x 6 cycles. She returns today for extensive counseling regarding decision making.  45 minutes of face to face counseling was spent with the patient and her husband reviewing the stage, the biology of her cancer, the prognosis associated with her disease, the recommended therapy and its common toxicities, the options for future therapy and alternative therapies.  She has not yet made her decision regarding therapy. She will notify me of this when she has made this decision but understands adjuvant therapy should start within 3 months of surgery.  Donaciano Eva, MD

## 2016-10-28 NOTE — Patient Instructions (Signed)
Please notify Dr Denman George of your plans for therapy or observation at [phone].

## 2016-11-02 ENCOUNTER — Ambulatory Visit: Payer: 59 | Admitting: Gynecologic Oncology

## 2016-11-02 ENCOUNTER — Telehealth: Payer: Self-pay | Admitting: Gynecologic Oncology

## 2016-11-02 NOTE — Telephone Encounter (Signed)
Thanks, Dr. Denman George I will call to arrange for chemo consent, etc.

## 2016-11-02 NOTE — Telephone Encounter (Signed)
Has decided to proceed with chemotherapy with Dr Alvy Bimler. I will help facilitate this.

## 2016-11-03 ENCOUNTER — Other Ambulatory Visit: Payer: Self-pay | Admitting: Hematology and Oncology

## 2016-11-03 ENCOUNTER — Telehealth: Payer: Self-pay | Admitting: *Deleted

## 2016-11-03 DIAGNOSIS — C541 Malignant neoplasm of endometrium: Secondary | ICD-10-CM

## 2016-11-03 NOTE — Progress Notes (Signed)
START ON PATHWAY REGIMEN - Uterine     A cycle is every 21 days:     Paclitaxel      Carboplatin   **Always confirm dose/schedule in your pharmacy ordering system**    Patient Characteristics: Endometrioid Histology, First Line - Newly Diagnosed, Medically Operable, Stage IIIA - Grade 1, 2, or 3 AJCC T Category: T3 AJCC N Category: N0 AJCC M Category: M0 AJCC 8 Stage Grouping: III Line of therapy: First Line - Newly Diagnosed Would you be surprised if this patient died  in the next year? I would be surprised if this patient died in the next year Patient Status: Medically Operable Intent of Therapy: Curative Intent, Discussed with Patient

## 2016-11-03 NOTE — Telephone Encounter (Signed)
Called patient to discuss port, appt with Dr Alvy Bimler and starting chemo.  Pt is OK with port. Will come to see Dr Alvy Bimler on 8/22 @ 1015 and start chemo on 8/29.  Msg to scheduler for 8/22 appt

## 2016-11-04 ENCOUNTER — Encounter: Payer: Self-pay | Admitting: Hematology and Oncology

## 2016-11-04 ENCOUNTER — Telehealth: Payer: Self-pay | Admitting: *Deleted

## 2016-11-04 ENCOUNTER — Ambulatory Visit (HOSPITAL_BASED_OUTPATIENT_CLINIC_OR_DEPARTMENT_OTHER): Payer: 59 | Admitting: Hematology and Oncology

## 2016-11-04 ENCOUNTER — Telehealth: Payer: Self-pay | Admitting: Hematology and Oncology

## 2016-11-04 VITALS — BP 121/89 | HR 69 | Temp 98.3°F | Resp 19 | Ht 66.0 in | Wt 200.4 lb

## 2016-11-04 DIAGNOSIS — Z7189 Other specified counseling: Secondary | ICD-10-CM

## 2016-11-04 DIAGNOSIS — J00 Acute nasopharyngitis [common cold]: Secondary | ICD-10-CM | POA: Diagnosis not present

## 2016-11-04 DIAGNOSIS — C541 Malignant neoplasm of endometrium: Secondary | ICD-10-CM

## 2016-11-04 MED ORDER — AMOXICILLIN-POT CLAVULANATE 875-125 MG PO TABS: 1.0000 | ORAL_TABLET | Freq: Two times a day (BID) | ORAL | 0 refills | Status: DC

## 2016-11-04 MED ORDER — PROCHLORPERAZINE MALEATE 10 MG PO TABS: 10.0000 mg | ORAL_TABLET | Freq: Four times a day (QID) | ORAL | 1 refills | Status: DC | PRN

## 2016-11-04 MED ORDER — LIDOCAINE-PRILOCAINE 2.5-2.5 % EX CREA: TOPICAL_CREAM | CUTANEOUS | 3 refills | Status: DC

## 2016-11-04 MED ORDER — DEXAMETHASONE 4 MG PO TABS: ORAL_TABLET | ORAL | 1 refills | Status: DC

## 2016-11-04 MED ORDER — ONDANSETRON HCL 8 MG PO TABS: 8.0000 mg | ORAL_TABLET | Freq: Two times a day (BID) | ORAL | 1 refills | Status: DC | PRN

## 2016-11-04 MED FILL — LIDOCAINE-PRILOCAINE CREAM: 2.5-2.5 | 30 days supply | Qty: 30 | Fill #0

## 2016-11-04 MED FILL — PROCHLORPERAZINE 10 MG TAB: 10 | 7 days supply | Qty: 30 | Fill #0

## 2016-11-04 MED FILL — AMOX TR-K CLV 875-125 MG TA: 875-125 | 7 days supply | Qty: 14 | Fill #0

## 2016-11-04 MED FILL — DEXAMETHASONE 4 MG TABLET: 4 | 21 days supply | Qty: 30 | Fill #0

## 2016-11-04 MED FILL — ONDANSETRON HCL 8 MG TAB: 8 | 15 days supply | Qty: 30 | Fill #0

## 2016-11-04 NOTE — Assessment & Plan Note (Signed)
We discussed the role of treatment. We reviewed the NCCN guidelines We discussed the role of chemotherapy. The intent is of curative intent.  We discussed some of the risks, benefits, side-effects of carboplatin & Taxol. Treatment is intravenous, every 3 weeks x 6 cycles  Some of the short term side-effects included, though not limited to, including weight loss, life threatening infections, risk of allergic reactions, need for transfusions of blood products, nausea, vomiting, change in bowel habits, loss of hair, admission to hospital for various reasons, and risks of death.   Long term side-effects are also discussed including risks of infertility, permanent damage to nerve function, hearing loss, chronic fatigue, kidney damage with possibility needing hemodialysis, and rare secondary malignancy including bone marrow disorders.  The patient is aware that the response rates discussed earlier is not guaranteed.  After a long discussion, patient made an informed decision to proceed with the prescribed plan of care.   Patient education material was dispensed. We discussed premedication with dexamethasone before chemotherapy. Given her young age, I will omit G-CSF unless she developed complications with neutropenic fever I will schedule port placement and start her on treatment next week I will see her back prior to cycle 2 of treatment

## 2016-11-04 NOTE — Assessment & Plan Note (Signed)
We discussed extensively about goals of care I recommend her not to work if possible due to risk of infection We also discussed extensively about potential medication interaction with chemotherapy The patient will bring me a list next week so that I can review them and make sure they would not interact with chemotherapy

## 2016-11-04 NOTE — Progress Notes (Signed)
[place] OFFICE PROGRESS NOTE  Patient Care Team: [place], Spicer as PCP - General (Family Medicine)  SUMMARY OF ONCOLOGIC HISTORY:   Endometrial cancer ([place])   08/11/2016 Procedure    Procedure: hysteroscopy, polypectomy and curettage with MyoSure       08/13/2016 Pathology Results    Endometrium, curettage, and endometrial polyp - ENDOMETRIAL CARCINOMA, SEE COMMENT. 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 There is a small focus (5%) of clear cell carcinoma and a few foci suspicious for serous carcinoma.       08/27/2016 Imaging    Ct scan of abdomen 1. No findings of adenopathy or metastatic disease in the abdomen or pelvis. 2. 2 by 3 mm peripheral right lower lobe pulmonary nodule on image 11/7. Highly likely to be benign based on morphology      09/01/2016 Pathology Results    1. Lymph node, sentinel, biopsy, right external iliac - ONE BENIGN LYMPH NODE (0/1). 2. Lymph node, sentinel, biopsy, right obturator - ONE BENIGN LYMPH NODE (0/1). 3. Lymph node, sentinel, biopsy, distal left external iliac - ONE BENIGN LYMPH NODE (0/1). 4. Lymph node, sentinel, biopsy, left proximal external iliac - ONE BENIGN LYMPH NODE (0/1). 5. Uterus +/- tubes/ovaries, neoplastic, cervix - ENDOMETRIAL ADENOCARCINOMA. - LUMINAL INVOLVEMENT OF RIGHT AND LEFT FALLOPIAN TUBES. - CERVIX AND BILATERAL OVARIES FREE OF TUMOR. - SEE ONCOLOGY TABLE AND COMMENT. Microscopic Comment 1. - 4. Immunohistochemistry for cytokeratin AE1/AE3 is performed on all of the lymph nodes (parts 1-4) and no positivity is identified. 5. ONCOLOGY TABLE-UTERUS, CARCINOMA OR CARCINOSARCOMA  Specimen: Uterus with bilateral fallopian tubes and ovaries with right and left pelvic lymph nodes Procedure: Hysterectomy with bilateral salpingo-oophorectomy Lymph node sampling performed: Yes Specimen integrity:  Intact Maximum tumor size: 1.8 cm Histologic type: Endometrial adenocarcinoma, see comment. Grade: II Myometrial invasion: 0 cm where myometrium is 1.1 cm in thickness Cervical stromal involvement: No Extent of involvement of other organs: Luminal involvement of right and left fallopian tubes, see comment. Lymph - vascular invasion: Not identified  Peritoneal washings: N/A Lymph nodes: Examined: 4 Sentinel 0 Non-sentinel 4 Total Lymph nodes with metastasis: 0 Isolated tumor cells (< 0.2 mm): 0 Micrometastasis: (> 0.2 mm and < 2.0 mm): 0 Macrometastasis: (> 2.0 mm): 0 Extracapsular extension: N/A Pelvic lymph nodes: 0 involved of 4 lymph nodes. Para-aortic lymph nodes: 0 involved of 0 lymph nodes. Other (specify involvement and site): N/A TNM code: pT3a, pN0 FIGO Stage (based on pathologic findings, needs clinical correlation): III-A  Comment: The endometrial tumor is a moderately differentiated mostly endometrioid adenocarcinoma with rare foci of squamous differentiation and a rare microscopic focus consistent with serous differentiation. There is also a microscopic focus with chondroid stroma which is associated with an adjacent microscopic serous component. This finding suggests a microscopic stromal component which is less than 1 mm in greatest dimension. In addition, there are luminal foci of carcinoma within the right and fallopian tubes and the right fallopian tube lumen involvement shows a focus of similar appearing chondroid stroma with adjacent microscopic focus of serous carcinoma.      09/01/2016 Surgery    Surgeon: Donaciano Eva   Operation: Robotic-assisted laparoscopic total hysterectomy with bilateral salpingoophorectomy, Sentinel lymph node biopsy  Operative Findings:  : 6cm normal appearing uterus, normal tubes and ovaries, no suspicious lymph nodes, no apparent peritoneal disease.        INTERVAL HISTORY: Please see below for  problem oriented  charting. She returns for further follow-up with her husband, Donnie She complained of some mild acute rhinitis and recent fever Her nasal drainage is persistent and she has some sinus congestion and discomfort She has mainly nonproductive cough She denies hot flashes Denies recent vaginal bleeding No changes in appetite or bowel habits She has a lot of concerns related to disability and work  REVIEW OF SYSTEMS:   Constitutional: Denies fevers, chills or abnormal weight loss Eyes: Denies blurriness of vision Ears, nose, mouth, throat, and face: Denies mucositis or sore throat Cardiovascular: Denies palpitation, chest discomfort or lower extremity swelling Gastrointestinal:  Denies nausea, heartburn or change in bowel habits Skin: Denies abnormal skin rashes Lymphatics: Denies new lymphadenopathy or easy bruising Neurological:Denies numbness, tingling or new weaknesses Behavioral/Psych: Mood is stable, no new changes  All other systems were reviewed with the patient and are negative.  I have reviewed the past medical history, past surgical history, social history and family history with the patient and they are unchanged from previous note.  ALLERGIES:  is allergic to latex and tamiflu  [oseltamivir phosphate].  MEDICATIONS:  Current Outpatient Prescriptions  Medication Sig Dispense Refill  . amoxicillin-clavulanate (AUGMENTIN) 875-125 MG tablet Take 1 tablet by mouth 2 (two) times daily. 14 tablet 0  . Ascorbic Acid (VITAMIN C PO) Take 1 tablet by mouth 3 (three) times daily as needed (for immune health support or cold symptoms.).    Marland Kitchen dexamethasone (DECADRON) 4 MG tablet Take 5 tabs the night before scheduled chemo and another 5 tabs at 6 am the day of chemo, every 21 days 30 tablet 1  . ECHINACEA HERB PO Take by mouth.    . Flaxseed Oil OIL Take by mouth.    . lidocaine-prilocaine (EMLA) cream Apply to affected area once 30 g 3  . ondansetron (ZOFRAN) 8 MG tablet Take 1 tablet (8  mg total) by mouth 2 (two) times daily as needed for refractory nausea / vomiting. Start on day 3 after chemo. 30 tablet 1  . prochlorperazine (COMPAZINE) 10 MG tablet Take 1 tablet (10 mg total) by mouth every 6 (six) hours as needed (Nausea or vomiting). 30 tablet 1  . saccharomyces boulardii (FLORASTOR) 250 MG capsule Take 250 mg by mouth daily.     No current facility-administered medications for this visit.     PHYSICAL EXAMINATION: ECOG PERFORMANCE STATUS: 1 - Symptomatic but completely ambulatory  Vitals:   11/04/16 1021  BP: 121/89  Pulse: 69  Resp: 19  Temp: 98.3 F (36.8 C)  SpO2: 100%   Filed Weights   11/04/16 1021  Weight: 200 lb 6.4 oz (90.9 kg)    GENERAL:alert, no distress and comfortable SKIN: skin color, texture, turgor are normal, no rashes or significant lesions EYES: normal, Conjunctiva are pink and non-injected, sclera clear OROPHARYNX:no exudate, no erythema and lips, buccal mucosa, and tongue normal  NECK: supple, thyroid normal size, non-tender, without nodularity LYMPH:  no palpable lymphadenopathy in the cervical, axillary or inguinal LUNGS: clear to auscultation and percussion with normal breathing effort HEART: regular rate & rhythm and no murmurs and no lower extremity edema ABDOMEN:abdomen soft, non-tender and normal bowel sounds Musculoskeletal:no cyanosis of digits and no clubbing  NEURO: alert & oriented x 3 with fluent speech, no focal motor/sensory deficits  LABORATORY DATA:  I have reviewed the data as listed    Component Value Date/Time   NA 137 09/02/2016 0538   K 4.2 09/02/2016 0538   CL 103  09/02/2016 0538   CO2 26 09/02/2016 0538   GLUCOSE 137 (H) 09/02/2016 0538   BUN 7 09/02/2016 0538   CREATININE 0.83 09/02/2016 0538   CALCIUM 8.8 (L) 09/02/2016 0538   PROT 6.8 08/27/2016 1422   ALBUMIN 4.2 08/27/2016 1422   AST 24 08/27/2016 1422   ALT 33 08/27/2016 1422   ALKPHOS 114 08/27/2016 1422   BILITOT 0.7 08/27/2016 1422    GFRNONAA >60 09/02/2016 0538   GFRAA >60 09/02/2016 0538    No results found for: SPEP, UPEP  Lab Results  Component Value Date   WBC 12.7 (H) 09/02/2016   NEUTROABS 5.8 08/27/2016   HGB 12.3 09/02/2016   HCT 37.3 09/02/2016   MCV 79.4 09/02/2016   PLT 280 09/02/2016      Chemistry      Component Value Date/Time   NA 137 09/02/2016 0538   K 4.2 09/02/2016 0538   CL 103 09/02/2016 0538   CO2 26 09/02/2016 0538   BUN 7 09/02/2016 0538   CREATININE 0.83 09/02/2016 0538      Component Value Date/Time   CALCIUM 8.8 (L) 09/02/2016 0538   ALKPHOS 114 08/27/2016 1422   AST 24 08/27/2016 1422   ALT 33 08/27/2016 1422   BILITOT 0.7 08/27/2016 1422      ASSESSMENT & PLAN:  Endometrial cancer ([place]) We discussed the role of treatment. We reviewed the NCCN guidelines We discussed the role of chemotherapy. The intent is of curative intent.  We discussed some of the risks, benefits, side-effects of carboplatin & Taxol. Treatment is intravenous, every 3 weeks x 6 cycles  Some of the short term side-effects included, though not limited to, including weight loss, life threatening infections, risk of allergic reactions, need for transfusions of blood products, nausea, vomiting, change in bowel habits, loss of hair, admission to hospital for various reasons, and risks of death.   Long term side-effects are also discussed including risks of infertility, permanent damage to nerve function, hearing loss, chronic fatigue, kidney damage with possibility needing hemodialysis, and rare secondary malignancy including bone marrow disorders.  The patient is aware that the response rates discussed earlier is not guaranteed.  After a long discussion, patient made an informed decision to proceed with the prescribed plan of care.   Patient education material was dispensed. We discussed premedication with dexamethasone before chemotherapy. Given her young age, I will omit G-CSF unless she developed  complications with neutropenic fever I will schedule port placement and start her on treatment next week I will see her back prior to cycle 2 of treatment  Acute rhinitis She have symptoms of bacteria rhinitis I recommend antibiotic treatment due to anticipated chemotherapy next week and she agreed Also recommend conservative management with Nasacort  Goals of care, counseling/discussion We discussed extensively about goals of care I recommend her not to work if possible due to risk of infection We also discussed extensively about potential medication interaction with chemotherapy The patient will bring me a list next week so that I can review them and make sure they would not interact with chemotherapy   Orders Placed This Encounter  Procedures  . CBC with Differential    Standing Status:   Standing    Number of Occurrences:   20    Standing Expiration Date:   11/05/2017  . Comprehensive metabolic panel    Standing Status:   Standing    Number of Occurrences:   20    Standing Expiration Date:   11/05/2017  All questions were answered. The patient knows to call the clinic with any problems, questions or concerns. No barriers to learning was detected. I spent 30 minutes counseling the patient face to face. The total time spent in the appointment was 40 minutes and more than 50% was on counseling and review of test results     Heath Lark, MD 11/04/2016 11:36 AM

## 2016-11-04 NOTE — Telephone Encounter (Signed)
Gave patient avs and they did not want a calendar.

## 2016-11-04 NOTE — Assessment & Plan Note (Signed)
She have symptoms of bacteria rhinitis I recommend antibiotic treatment due to anticipated chemotherapy next week and she agreed Also recommend conservative management with Nasacort

## 2016-11-04 NOTE — Telephone Encounter (Signed)
IR will draw labs prior to [place].

## 2016-11-09 ENCOUNTER — Other Ambulatory Visit: Payer: Self-pay | Admitting: Student

## 2016-11-09 ENCOUNTER — Other Ambulatory Visit: Payer: Self-pay | Admitting: Radiology

## 2016-11-10 ENCOUNTER — Other Ambulatory Visit: Payer: Self-pay | Admitting: Hematology and Oncology

## 2016-11-10 ENCOUNTER — Ambulatory Visit (HOSPITAL_COMMUNITY)
Admission: RE | Admit: 2016-11-10 | Discharge: 2016-11-10 | Disposition: A | Discharge status: Home / Self Care | Payer: 59 | Source: Ambulatory Visit | Attending: Hematology and Oncology | Admitting: Hematology and Oncology

## 2016-11-10 ENCOUNTER — Encounter (HOSPITAL_COMMUNITY): Payer: Self-pay | Admitting: *Deleted

## 2016-11-10 DIAGNOSIS — Z87891 Personal history of nicotine dependence: Secondary | ICD-10-CM | POA: Insufficient documentation

## 2016-11-10 DIAGNOSIS — C541 Malignant neoplasm of endometrium: Secondary | ICD-10-CM | POA: Diagnosis not present

## 2016-11-10 HISTORY — PX: IR FLUORO GUIDE PORT INSERTION RIGHT: IMG5741

## 2016-11-10 HISTORY — PX: IR US GUIDE VASC ACCESS RIGHT: IMG2390

## 2016-11-10 LAB — COMPREHENSIVE METABOLIC PANEL
ALBUMIN: 4.3 g/dL (ref 3.5–5.0)
ALT: 43 U/L (ref 14–54)
AST: 34 U/L (ref 15–41)
Alkaline Phosphatase: 117 U/L (ref 38–126)
Anion gap: 9 (ref 5–15)
BUN: 12 mg/dL (ref 6–20)
CALCIUM: 9.3 mg/dL (ref 8.9–10.3)
CO2: 25 mmol/L (ref 22–32)
CREATININE: 0.73 mg/dL (ref 0.44–1.00)
Chloride: 105 mmol/L (ref 101–111)
GFR calc Af Amer: >60 mL/min (ref >60)
GFR calc non Af Amer: >60 mL/min (ref >60)
GLUCOSE: 90 mg/dL (ref 65–99)
Potassium: 3.8 mmol/L (ref 3.5–5.1)
SODIUM: 139 mmol/L (ref 135–145)
Total Bilirubin: 0.6 mg/dL (ref 0.3–1.2)
Total Protein: 7.2 g/dL (ref 6.5–8.1)

## 2016-11-10 LAB — CBC
HEMATOCRIT: 40.3 % (ref 36.0–46.0)
HEMOGLOBIN: 13.4 g/dL (ref 12.0–15.0)
MCH: 26.6 pg (ref 26.0–34.0)
MCHC: 33.3 g/dL (ref 30.0–36.0)
MCV: 80.1 fL (ref 78.0–100.0)
Platelets: 280 10*3/uL (ref 150–400)
RBC: 5.03 MIL/uL (ref 3.87–5.11)
RDW: 13.8 % (ref 11.5–15.5)
WBC: 7.9 10*3/uL (ref 4.0–10.5)

## 2016-11-10 LAB — APTT: APTT: 29 s (ref 24–36)

## 2016-11-10 LAB — PROTIME-INR
INR: 0.93
PROTHROMBIN TIME: 12.3 s (ref 11.4–15.2)

## 2016-11-10 MED ORDER — FENTANYL CITRATE (PF) 100 MCG/2ML IJ SOLN
INTRAMUSCULAR | Status: AC | PRN
  Administered 2016-11-10 (×2): 50 ug via INTRAVENOUS

## 2016-11-10 MED ORDER — MIDAZOLAM HCL 2 MG/2ML IJ SOLN
INTRAMUSCULAR | Status: AC | PRN
  Administered 2016-11-10 (×2): 1 mg via INTRAVENOUS

## 2016-11-10 MED ORDER — HEPARIN SOD (PORK) LOCK FLUSH 100 UNIT/ML IV SOLN
INTRAVENOUS | Status: AC
  Filled 2016-11-10: qty 5

## 2016-11-10 MED ORDER — LIDOCAINE-EPINEPHRINE (PF) 2 %-1:200000 IJ SOLN
INTRAMUSCULAR | Status: AC | PRN
  Administered 2016-11-10: 10 mL via INTRADERMAL

## 2016-11-10 MED ORDER — CEFAZOLIN SODIUM-DEXTROSE 2-4 GM/100ML-% IV SOLN
INTRAVENOUS | Status: AC
  Administered 2016-11-10: 2 g via INTRAVENOUS
  Filled 2016-11-10: qty 100

## 2016-11-10 MED ORDER — SODIUM CHLORIDE 0.9 % IV SOLN
INTRAVENOUS | Status: DC
  Administered 2016-11-10: 13:00:00 via INTRAVENOUS

## 2016-11-10 MED ORDER — FENTANYL CITRATE (PF) 100 MCG/2ML IJ SOLN
INTRAMUSCULAR | Status: AC
  Filled 2016-11-10: qty 4

## 2016-11-10 MED ORDER — CEFAZOLIN SODIUM-DEXTROSE 2-4 GM/100ML-% IV SOLN
2.0000 g | INTRAVENOUS | Status: AC
  Administered 2016-11-10: 2 g via INTRAVENOUS

## 2016-11-10 MED ORDER — MIDAZOLAM HCL 2 MG/2ML IJ SOLN
INTRAMUSCULAR | Status: AC
  Filled 2016-11-10: qty 4

## 2016-11-10 MED ORDER — LIDOCAINE-EPINEPHRINE (PF) 2 %-1:200000 IJ SOLN
INTRAMUSCULAR | Status: AC
  Filled 2016-11-10: qty 20

## 2016-11-10 NOTE — Discharge Instructions (Signed)
Moderate Conscious Sedation, Adult, Care After °These instructions provide you with information about caring for yourself after your procedure. Your health care provider may also give you more specific instructions. Your treatment has been planned according to current medical practices, but problems sometimes occur. Call your health care provider if you have any problems or questions after your procedure. °What can I expect after the procedure? °After your procedure, it is common: °· To feel sleepy for several hours. °· To feel clumsy and have poor balance for several hours. °· To have poor judgment for several hours. °· To vomit if you eat too soon. ° °Follow these instructions at home: °For at least 24 hours after the procedure: ° °· Do not: °? Participate in activities where you could fall or become injured. °? Drive. °? Use heavy machinery. °? Drink alcohol. °? Take sleeping pills or medicines that cause drowsiness. °? Make important decisions or sign legal documents. °? Take care of children on your own. °· Rest. °Eating and drinking °· Follow the diet recommended by your health care provider. °· If you vomit: °? Drink water, juice, or soup when you can drink without vomiting. °? Make sure you have little or no nausea before eating solid foods. °General instructions °· Have a responsible adult stay with you until you are awake and alert. °· Take over-the-counter and prescription medicines only as told by your health care provider. °· If you smoke, do not smoke without supervision. °· Keep all follow-up visits as told by your health care provider. This is important. °Contact a health care provider if: °· You keep feeling nauseous or you keep vomiting. °· You feel light-headed. °· You develop a rash. °· You have a fever. °Get help right away if: °· You have trouble breathing. °This information is not intended to replace advice given to you by your health care provider. Make sure you discuss any questions you have  with your health care provider. °Document Released: 12/21/2012 Document Revised: 08/05/2015 Document Reviewed: 06/22/2015 °Elsevier Interactive Patient Education © 2018 [employer] ° ° °Implanted Port Home Guide °An implanted port is a type of central line that is placed under the skin. Central lines are used to provide IV access when treatment or nutrition needs to be given through a person’s veins. Implanted ports are used for long-term IV access. An implanted port may be placed because: °· You need IV medicine that would be irritating to the small veins in your hands or arms. °· You need long-term IV medicines, such as antibiotics. °· You need IV nutrition for a long period. °· You need frequent blood draws for lab tests. °· You need dialysis. ° °Implanted ports are usually placed in the chest area, but they can also be placed in the upper arm, the abdomen, or the leg. An implanted port has two main parts: °· Reservoir. The reservoir is round and will appear as a small, raised area under your skin. The reservoir is the part where a needle is inserted to give medicines or draw blood. °· Catheter. The catheter is a thin, flexible tube that extends from the reservoir. The catheter is placed into a large vein. Medicine that is inserted into the reservoir goes into the catheter and then into the vein. ° °How will I care for my incision site? °Do not get the incision site wet. Bathe or shower as directed by your health care provider. °How is my port accessed? °Special steps must be taken to access the   port: °· Before the port is accessed, a numbing cream can be placed on the skin. This helps numb the skin over the port site. °· Your health care provider uses a sterile technique to access the port. °? Your health care provider must put on a mask and sterile gloves. °? The skin over your port is cleaned carefully with an antiseptic and allowed to dry. °? The port is gently pinched between sterile gloves, and a needle  is inserted into the port. °· Only "non-coring" port needles should be used to access the port. Once the port is accessed, a blood return should be checked. This helps ensure that the port is in the vein and is not clogged. °· If your port needs to remain accessed for a constant infusion, a clear (transparent) bandage will be placed over the needle site. The bandage and needle will need to be changed every week, or as directed by your health care provider. °· Keep the bandage covering the needle clean and dry. Do not get it wet. Follow your health care provider’s instructions on how to take a shower or bath while the port is accessed. °· If your port does not need to stay accessed, no bandage is needed over the port. ° °What is flushing? °Flushing helps keep the port from getting clogged. Follow your health care provider’s instructions on how and when to flush the port. Ports are usually flushed with saline solution or a medicine called heparin. The need for flushing will depend on how the port is used. °· If the port is used for intermittent medicines or blood draws, the port will need to be flushed: °? After medicines have been given. °? After blood has been drawn. °? As part of routine maintenance. °· If a constant infusion is running, the port may not need to be flushed. ° °How long will my port stay implanted? °The port can stay in for as long as your health care provider thinks it is needed. When it is time for the port to come out, surgery will be done to remove it. The procedure is similar to the one performed when the port was put in. °When should I seek immediate medical care? °When you have an implanted port, you should seek immediate medical care if: °· You notice a bad smell coming from the incision site. °· You have swelling, redness, or drainage at the incision site. °· You have more swelling or pain at the port site or the surrounding area. °· You have a fever that is not controlled with  medicine. ° °This information is not intended to replace advice given to you by your health care provider. Make sure you discuss any questions you have with your health care provider. °Document Released: 03/02/2005 Document Revised: 08/08/2015 Document Reviewed: 11/07/2012 °Elsevier Interactive Patient Education © 2017 [employer] ° ° °Implanted Port Insertion, Care After °This sheet gives you information about how to care for yourself after your procedure. Your health care provider may also give you more specific instructions. If you have problems or questions, contact your health care provider. °What can I expect after the procedure? °After your procedure, it is common to have: °· Discomfort at the port insertion site. °· Bruising on the skin over the port. This should improve over 3-4 days. ° °Follow these instructions at home: °Port care °· After your port is placed, you will get a manufacturer's information card. The card has information about your port. Keep this card with   you at all times. °· Take care of the port as told by your health care provider. Ask your health care provider if you or a family member can get training for taking care of the port at home. A home health care nurse may also take care of the port. °· Make sure to remember what type of port you have. °Incision care °· Follow instructions from your health care provider about how to take care of your port insertion site. Make sure you: °? Wash your hands with soap and water before you change your bandage (dressing). If soap and water are not available, use hand sanitizer. °? Change your dressing as told by your health care provider. °? Leave stitches (sutures), skin glue, or adhesive strips in place. These skin closures may need to stay in place for 2 weeks or longer. If adhesive strip edges start to loosen and curl up, you may trim the loose edges. Do not remove adhesive strips completely unless your health care provider tells you to do  that. °· Check your port insertion site every day for signs of infection. Check for: °? More redness, swelling, or pain. °? More fluid or blood. °? Warmth. °? Pus or a bad smell. °General instructions °· Do not take baths, swim, or use a hot tub until your health care provider approves. °· Do not lift anything that is heavier than 10 lb (4.5 kg) for a week, or as told by your health care provider. °· Ask your health care provider when it is okay to: °? Return to work or school. °? Resume usual physical activities or sports. °· Do not drive for 24 hours if you were given a medicine to help you relax (sedative). °· Take over-the-counter and prescription medicines only as told by your health care provider. °· Wear a medical alert bracelet in case of an emergency. This will tell any health care providers that you have a port. °· Keep all follow-up visits as told by your health care provider. This is important. °Contact a health care provider if: °· You cannot flush your port with saline as directed, or you cannot draw blood from the port. °· You have a fever or chills. °· You have more redness, swelling, or pain around your port insertion site. °· You have more fluid or blood coming from your port insertion site. °· Your port insertion site feels warm to the touch. °· You have pus or a bad smell coming from the port insertion site. °Get help right away if: °· You have chest pain or shortness of breath. °· You have bleeding from your port that you cannot control. °Summary °· Take care of the port as told by your health care provider. °· Change your dressing as told by your health care provider. °· Keep all follow-up visits as told by your health care provider. °This information is not intended to replace advice given to you by your health care provider. Make sure you discuss any questions you have with your health care provider. °Document Released: 12/21/2012 Document Revised: 01/22/2016 Document Reviewed:  01/22/2016 °Elsevier Interactive Patient Education © 2017 [employer] ° ° °

## 2016-11-10 NOTE — Procedures (Signed)
Interventional Radiology Procedure Note  Procedure: Placement of a right IJ approach single lumen PowerPort.  Tip is positioned at the superior cavoatrial junction and catheter is ready for immediate use.  Complications: No immediate Recommendations:  - Ok to shower tomorrow - Do not submerge for 7 days - Routine line care   Signed,  Heath K. McCullough, MD   

## 2016-11-10 NOTE — Consult Note (Signed)
Chief Complaint: Patient was seen in consultation today for Port-A-Cath placement  Referring Physician(s): Chillum  Supervising Physician: Jacqulynn Cadet  Patient Status: [facility] - Out-pt  History of Present Illness: Danielle Guzman is a 51 y.o. 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 She presents today for Port-A-Cath placement for planned chemotherapy.  Past Medical History:  Diagnosis Date  . Cancer ([place]) 2018   endometrial   . Headache    hx migraines yrs ago  . Malaria as child  . Miscarriage    x 2  . Pneumonia yrs ago  . Seizures ([place])    petite mal seizure in high school x 1, none since    Past Surgical History:  Procedure Laterality Date  . DENTAL SURGERY     gum graft 20 yrs ago and again on 08/26/16  . DILATATION & CURETTAGE/HYSTEROSCOPY WITH MYOSURE N/A 08/11/2016   Procedure: DILATATION & CURETTAGE/HYSTEROSCOPY WITH MYOSURE;  Surgeon: Jerelyn Charles, MD;  Location: [facility];  Service: Gynecology;  Laterality: N/A;  polyp removal  . LYMPH NODE BIOPSY N/A 09/01/2016   Procedure: SENTINEL LYMPH NODE BIOPSY;  Surgeon: Everitt Amber, MD;  Location: [facility];  Service: Gynecology;  Laterality: N/A;  . ROBOTIC ASSISTED TOTAL HYSTERECTOMY WITH BILATERAL SALPINGO OOPHERECTOMY Bilateral 09/01/2016   Procedure: ROBOTIC ASSISTED TOTAL HYSTERECTOMY WITH BILATERAL SALPINGO OOPHORECTOMY;  Surgeon: Everitt Amber, MD;  Location: [facility];  Service: Gynecology;  Laterality: Bilateral;  . WISDOM TOOTH EXTRACTION      Allergies: Latex and Tamiflu  [oseltamivir phosphate]  Medications: Prior to Admission medications   Medication Sig Start Date End Date Taking? Authorizing Provider  amoxicillin-clavulanate (AUGMENTIN) 875-125 MG tablet Take 1 tablet by mouth 2 (two) times daily. 11/04/16   Heath Lark, MD  Ascorbic Acid (VITAMIN C PO) Take 1 tablet by mouth 3 (three) times daily as needed (for immune health  support or cold symptoms.).    [provider]  dexamethasone (DECADRON) 4 MG tablet Take 5 tabs the night before scheduled chemo and another 5 tabs at 6 am the day of chemo, every 21 days 11/04/16   Heath Lark, MD  [place] HERB PO Take by mouth.    [provider]  Flaxseed Oil OIL Take by mouth.    [provider]  lidocaine-prilocaine (EMLA) cream Apply to affected area once 11/04/16   Heath Lark, MD  ondansetron (ZOFRAN) 8 MG tablet Take 1 tablet (8 mg total) by mouth 2 (two) times daily as needed for refractory nausea / vomiting. Start on day 3 after chemo. 11/04/16   Heath Lark, MD  prochlorperazine (COMPAZINE) 10 MG tablet Take 1 tablet (10 mg total) by mouth every 6 (six) hours as needed (Nausea or vomiting). 11/04/16   Heath Lark, MD  saccharomyces boulardii (FLORASTOR) 250 MG capsule Take 250 mg by mouth daily.    [provider]     Family History  Problem Relation Age of Onset  . Cancer Mother   . Cancer Sister        Cervix  . Cancer Maternal Aunt        Breast, uterine  . Cancer Paternal Grandmother        Colon    Social History   Social History  . Marital status: Married    Spouse name: Danielle Rota "Donnie"  . Number of children: 0  . Years of education: N/A   Occupational History  . massage  therapist    Social History Main Topics  . Smoking status: Former Smoker    Packs/day: 1.50    Years: 8.00    Types: Cigarettes  . Smokeless tobacco: Never Used     Comment: quit smoking at age 60  . Alcohol use 2.4 oz/week    4 Glasses of wine per week     Comment: wine occ  . Drug use: No  . Sexual activity: Yes    Birth control/ protection: Post-menopausal   Other Topics Concern  . Not on file   Social History Narrative  . No narrative on file      Review of Systems denies fever, headache, chest pain, dyspnea, abdominal/back pain, nausea, vomiting or bleeding. She does have occasional cough.  Vital Signs:  Vitals:    11/10/16 1300  BP: 126/66  Pulse: (!) 58  Resp: 16  Temp: 98.4 F (36.9 C)  SpO2: 100%     Physical Exam  awake, alert. Chest clear to auscultation bilaterally. Heart with regular rate and rhythm. Abdomen soft, positive bowel sounds, nontender. Extremities with full range of motion.  Mallampati Score:     Imaging: No results found.  Labs:  CBC:  Recent Labs  08/11/16 1125 08/27/16 1422 09/02/16 0538  WBC 7.1 8.8 12.7*  HGB 14.1 13.7 12.3  HCT 43.1 42.0 37.3  PLT 265 254 280    COAGS: No results for input(s): INR, APTT in the last 8760 hours.  BMP:  Recent Labs  08/27/16 1422 09/02/16 0538  NA 140 137  K 4.5 4.2  CL 103 103  CO2 27 26  GLUCOSE 96 137*  BUN 13 7  CALCIUM 9.4 8.8*  CREATININE 0.89 0.83  GFRNONAA >60 >60  GFRAA >60 >60    LIVER FUNCTION TESTS:  Recent Labs  08/27/16 1422  BILITOT 0.7  AST 24  ALT 33  ALKPHOS 114  PROT 6.8  ALBUMIN 4.2    TUMOR MARKERS: No results for input(s): AFPTM, CEA, CA199, CHROMGRNA in the last 8760 hours.  Assessment and Plan: 51 y.o. 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 She presents today for Port-A-Cath placement for planned chemotherapy.Risks and benefits discussed with the patient including, but not limited to bleeding, infection, pneumothorax, or fibrin sheath development and need for additional procedures.All of the patient's questions were answered, patient is agreeable to proceed.Consent signed and in chart.Labs pending.      Thank you for this interesting consult.  I greatly enjoyed meeting Danielle Guzman and look forward to participating in their care.  A copy of this report was sent to the requesting provider on this date.  Electronically Signed: D. Rowe Robert, PA-C 11/10/2016, 1:09 PM   I spent a total of 25 minutes in face to face in clinical consultation, greater than 50% of which was  counseling/coordinating care for Port-A-Cath placement

## 2016-11-11 ENCOUNTER — Other Ambulatory Visit: Payer: Self-pay | Admitting: Hematology and Oncology

## 2016-11-11 ENCOUNTER — Telehealth: Payer: Self-pay | Admitting: *Deleted

## 2016-11-11 NOTE — Telephone Encounter (Signed)
Pt returned call. States her port site is feeling better, but at times feels like it might be hitting a nerve. Continues to take tylenol and use ice. Will be at [facility] tomorrow for treatment

## 2016-11-11 NOTE — Telephone Encounter (Signed)
Left message on cell phone to see how she is doing after her port placement. Had reported pain and was instructed to use ice and tylenol

## 2016-11-12 ENCOUNTER — Other Ambulatory Visit: Payer: 59

## 2016-11-12 ENCOUNTER — Ambulatory Visit (HOSPITAL_BASED_OUTPATIENT_CLINIC_OR_DEPARTMENT_OTHER): Payer: 59

## 2016-11-12 VITALS — BP 120/67 | HR 63 | Temp 98.5°F | Resp 18

## 2016-11-12 DIAGNOSIS — Z5111 Encounter for antineoplastic chemotherapy: Secondary | ICD-10-CM

## 2016-11-12 DIAGNOSIS — C541 Malignant neoplasm of endometrium: Secondary | ICD-10-CM | POA: Diagnosis not present

## 2016-11-12 MED ORDER — DIPHENHYDRAMINE HCL 50 MG/ML IJ SOLN
INTRAMUSCULAR | Status: AC
Start: 2016-11-12 — End: 2016-11-12
  Filled 2016-11-12: qty 1

## 2016-11-12 MED ORDER — PALONOSETRON HCL INJECTION 0.25 MG/5ML
0.2500 mg | Freq: Once | INTRAVENOUS | Status: AC
  Administered 2016-11-12: 0.25 mg via INTRAVENOUS

## 2016-11-12 MED ORDER — FAMOTIDINE IN NACL 20-0.9 MG/50ML-% IV SOLN
20.0000 mg | Freq: Once | INTRAVENOUS | Status: AC
  Administered 2016-11-12: 20 mg via INTRAVENOUS

## 2016-11-12 MED ORDER — HEPARIN SOD (PORK) LOCK FLUSH 100 UNIT/ML IV SOLN
500.0000 [IU] | Freq: Once | INTRAVENOUS | Status: AC | PRN
  Administered 2016-11-12: 500 [IU]
  Filled 2016-11-12: qty 5

## 2016-11-12 MED ORDER — PALONOSETRON HCL INJECTION 0.25 MG/5ML
INTRAVENOUS | Status: AC
  Filled 2016-11-12: qty 5

## 2016-11-12 MED ORDER — DEXTROSE 5 % IV SOLN
175.0000 mg/m2 | Freq: Once | INTRAVENOUS | Status: AC
  Administered 2016-11-12: 366 mg via INTRAVENOUS
  Filled 2016-11-12: qty 61

## 2016-11-12 MED ORDER — FAMOTIDINE IN NACL 20-0.9 MG/50ML-% IV SOLN
INTRAVENOUS | Status: AC
  Filled 2016-11-12: qty 50

## 2016-11-12 MED ORDER — SODIUM CHLORIDE 0.9 % IV SOLN
Freq: Once | INTRAVENOUS | Status: AC
  Administered 2016-11-12: 11:00:00 via INTRAVENOUS
  Filled 2016-11-12: qty 5

## 2016-11-12 MED ORDER — SODIUM CHLORIDE 0.9 % IV SOLN
723.0000 mg | Freq: Once | INTRAVENOUS | Status: AC
  Administered 2016-11-12: 720 mg via INTRAVENOUS
  Filled 2016-11-12: qty 72

## 2016-11-12 MED ORDER — SODIUM CHLORIDE 0.9% FLUSH
10.0000 mL | INTRAVENOUS | Status: DC | PRN
  Administered 2016-11-12: 10 mL
  Filled 2016-11-12: qty 10

## 2016-11-12 MED ORDER — DIPHENHYDRAMINE HCL 50 MG/ML IJ SOLN
50.0000 mg | Freq: Once | INTRAMUSCULAR | Status: AC
  Administered 2016-11-12: 50 mg via INTRAVENOUS

## 2016-11-12 MED ORDER — SODIUM CHLORIDE 0.9 % IV SOLN
Freq: Once | INTRAVENOUS | Status: AC
  Administered 2016-11-12: 11:00:00 via INTRAVENOUS

## 2016-11-12 NOTE — Patient Instructions (Signed)
[facility] Discharge Instructions for Patients Receiving Chemotherapy  Today you received the following chemotherapy agents Taxol and Carboplatin  To help prevent nausea and vomiting after your treatment, we encourage you to take your nausea medication as directed If you develop nausea and vomiting that is not controlled by your nausea medication, call the clinic.   BELOW ARE SYMPTOMS THAT SHOULD BE REPORTED IMMEDIATELY:  *FEVER GREATER THAN 100.5 F  *CHILLS WITH OR WITHOUT FEVER  NAUSEA AND VOMITING THAT IS NOT CONTROLLED WITH YOUR NAUSEA MEDICATION  *UNUSUAL SHORTNESS OF BREATH  *UNUSUAL BRUISING OR BLEEDING  TENDERNESS IN MOUTH AND THROAT WITH OR WITHOUT PRESENCE OF ULCERS  *URINARY PROBLEMS  *BOWEL PROBLEMS  UNUSUAL RASH Items with * indicate a potential emergency and should be followed up as soon as possible.  Feel free to call the clinic you have any questions or concerns. The clinic phone number is [phone].  Please show the [facility] at check-in to the Emergency Department and triage nurse.   Paclitaxel/ TAXOL injection What is this medicine? PACLITAXEL (PAK li TAX el) is a chemotherapy drug. It targets fast dividing cells, like cancer cells, and causes these cells to die. This medicine is used to treat ovarian cancer, breast cancer, and other cancers. This medicine may be used for other purposes; ask your health care provider or pharmacist if you have questions. COMMON BRAND NAME(S): Onxol, Taxol What should I tell my health care provider before I take this medicine? They need to know if you have any of these conditions: -blood disorders -irregular heartbeat -infection (especially a virus infection such as chickenpox, cold sores, or herpes) -liver disease -previous or ongoing radiation therapy -an unusual or allergic reaction to paclitaxel, alcohol, polyoxyethylated castor oil, other chemotherapy agents, other medicines, foods, dyes,  or preservatives -pregnant or trying to get pregnant -breast-feeding How should I use this medicine? This drug is given as an infusion into a vein. It is administered in a hospital or clinic by a specially trained health care professional. Talk to your pediatrician regarding the use of this medicine in children. Special care may be needed. Overdosage: If you think you have taken too much of this medicine contact a poison control center or emergency room at once. NOTE: This medicine is only for you. Do not share this medicine with others. What if I miss a dose? It is important not to miss your dose. Call your doctor or health care professional if you are unable to keep an appointment. What may interact with this medicine? Do not take this medicine with any of the following medications: -disulfiram -metronidazole This medicine may also interact with the following medications: -cyclosporine -diazepam -ketoconazole -medicines to increase blood counts like filgrastim, pegfilgrastim, sargramostim -other chemotherapy drugs like cisplatin, doxorubicin, epirubicin, etoposide, teniposide, vincristine -quinidine -testosterone -vaccines -verapamil Talk to your doctor or health care professional before taking any of these medicines: -acetaminophen -aspirin -ibuprofen -ketoprofen -naproxen This list may not describe all possible interactions. Give your health care provider a list of all the medicines, herbs, non-prescription drugs, or dietary supplements you use. Also tell them if you smoke, drink alcohol, or use illegal drugs. Some items may interact with your medicine. What should I watch for while using this medicine? Your condition will be monitored carefully while you are receiving this medicine. You will need important blood work done while you are taking this medicine. This medicine can cause serious allergic reactions. To reduce your risk you will need to  take other medicine(s) before  treatment with this medicine. If you experience allergic reactions like skin rash, itching or hives, swelling of the face, lips, or tongue, tell your doctor or health care professional right away. In some cases, you may be given additional medicines to help with side effects. Follow all directions for their use. This drug may make you feel generally unwell. This is not uncommon, as chemotherapy can affect healthy cells as well as cancer cells. Report any side effects. Continue your course of treatment even though you feel ill unless your doctor tells you to stop. Call your doctor or health care professional for advice if you get a fever, chills or sore throat, or other symptoms of a cold or flu. Do not treat yourself. This drug decreases your body's ability to fight infections. Try to avoid being around people who are sick. This medicine may increase your risk to bruise or bleed. Call your doctor or health care professional if you notice any unusual bleeding. Be careful brushing and flossing your teeth or using a toothpick because you may get an infection or bleed more easily. If you have any dental work done, tell your dentist you are receiving this medicine. Avoid taking products that contain aspirin, acetaminophen, ibuprofen, naproxen, or ketoprofen unless instructed by your doctor. These medicines may hide a fever. Do not become pregnant while taking this medicine. Women should inform their doctor if they wish to become pregnant or think they might be pregnant. There is a potential for serious side effects to an unborn child. Talk to your health care professional or pharmacist for more information. Do not breast-feed an infant while taking this medicine. Men are advised not to father a child while receiving this medicine. This product may contain alcohol. Ask your pharmacist or healthcare provider if this medicine contains alcohol. Be sure to tell all healthcare providers you are taking this medicine.  Certain medicines, like metronidazole and disulfiram, can cause an unpleasant reaction when taken with alcohol. The reaction includes flushing, headache, nausea, vomiting, sweating, and increased thirst. The reaction can last from 30 minutes to several hours. What side effects may I notice from receiving this medicine? Side effects that you should report to your doctor or health care professional as soon as possible: -allergic reactions like skin rash, itching or hives, swelling of the face, lips, or tongue -low blood counts - This drug may decrease the number of white blood cells, red blood cells and platelets. You may be at increased risk for infections and bleeding. -signs of infection - fever or chills, cough, sore throat, pain or difficulty passing urine -signs of decreased platelets or bleeding - bruising, pinpoint red spots on the skin, black, tarry stools, nosebleeds -signs of decreased red blood cells - unusually weak or tired, fainting spells, lightheadedness -breathing problems -chest pain -high or low blood pressure -mouth sores -nausea and vomiting -pain, swelling, redness or irritation at the injection site -pain, tingling, numbness in the hands or feet -slow or irregular heartbeat -swelling of the ankle, feet, hands Side effects that usually do not require medical attention (report to your doctor or health care professional if they continue or are bothersome): -bone pain -complete hair loss including hair on your head, underarms, pubic hair, eyebrows, and eyelashes -changes in the color of fingernails -diarrhea -loosening of the fingernails -loss of appetite -muscle or joint pain -red flush to skin -sweating This list may not describe all possible side effects. Call your doctor for medical advice about   side effects. You may report side effects to FDA at 1-800-FDA-1088. Where should I keep my medicine? This drug is given in a hospital or clinic and will not be stored at  home. NOTE: This sheet is a summary. It may not cover all possible information. If you have questions about this medicine, talk to your doctor, pharmacist, or health care provider.  2018 Elsevier/Gold Standard (2015-01-01 19:58:00)   Carboplatin injection What is this medicine? CARBOPLATIN (KAR boe pla tin) is a chemotherapy drug. It targets fast dividing cells, like cancer cells, and causes these cells to die. This medicine is used to treat ovarian cancer and many other cancers. This medicine may be used for other purposes; ask your health care provider or pharmacist if you have questions. COMMON BRAND NAME(S): Paraplatin What should I tell my health care provider before I take this medicine? They need to know if you have any of these conditions: -blood disorders -hearing problems -kidney disease -recent or ongoing radiation therapy -an unusual or allergic reaction to carboplatin, cisplatin, other chemotherapy, other medicines, foods, dyes, or preservatives -pregnant or trying to get pregnant -breast-feeding How should I use this medicine? This drug is usually given as an infusion into a vein. It is administered in a hospital or clinic by a specially trained health care professional. Talk to your pediatrician regarding the use of this medicine in children. Special care may be needed. Overdosage: If you think you have taken too much of this medicine contact a poison control center or emergency room at once. NOTE: This medicine is only for you. Do not share this medicine with others. What if I miss a dose? It is important not to miss a dose. Call your doctor or health care professional if you are unable to keep an appointment. What may interact with this medicine? -medicines for seizures -medicines to increase blood counts like filgrastim, pegfilgrastim, sargramostim -some antibiotics like amikacin, gentamicin, neomycin, streptomycin, tobramycin -vaccines Talk to your doctor or health  care professional before taking any of these medicines: -acetaminophen -aspirin -ibuprofen -ketoprofen -naproxen This list may not describe all possible interactions. Give your health care provider a list of all the medicines, herbs, non-prescription drugs, or dietary supplements you use. Also tell them if you smoke, drink alcohol, or use illegal drugs. Some items may interact with your medicine. What should I watch for while using this medicine? Your condition will be monitored carefully while you are receiving this medicine. You will need important blood work done while you are taking this medicine. This drug may make you feel generally unwell. This is not uncommon, as chemotherapy can affect healthy cells as well as cancer cells. Report any side effects. Continue your course of treatment even though you feel ill unless your doctor tells you to stop. In some cases, you may be given additional medicines to help with side effects. Follow all directions for their use. Call your doctor or health care professional for advice if you get a fever, chills or sore throat, or other symptoms of a cold or flu. Do not treat yourself. This drug decreases your body's ability to fight infections. Try to avoid being around people who are sick. This medicine may increase your risk to bruise or bleed. Call your doctor or health care professional if you notice any unusual bleeding. Be careful brushing and flossing your teeth or using a toothpick because you may get an infection or bleed more easily. If you have any dental work done, tell your dentist  you are receiving this medicine. Avoid taking products that contain aspirin, acetaminophen, ibuprofen, naproxen, or ketoprofen unless instructed by your doctor. These medicines may hide a fever. Do not become pregnant while taking this medicine. Women should inform their doctor if they wish to become pregnant or think they might be pregnant. There is a potential for serious  side effects to an unborn child. Talk to your health care professional or pharmacist for more information. Do not breast-feed an infant while taking this medicine. What side effects may I notice from receiving this medicine? Side effects that you should report to your doctor or health care professional as soon as possible: -allergic reactions like skin rash, itching or hives, swelling of the face, lips, or tongue -signs of infection - fever or chills, cough, sore throat, pain or difficulty passing urine -signs of decreased platelets or bleeding - bruising, pinpoint red spots on the skin, black, tarry stools, nosebleeds -signs of decreased red blood cells - unusually weak or tired, fainting spells, lightheadedness -breathing problems -changes in hearing -changes in vision -chest pain -high blood pressure -low blood counts - This drug may decrease the number of white blood cells, red blood cells and platelets. You may be at increased risk for infections and bleeding. -nausea and vomiting -pain, swelling, redness or irritation at the injection site -pain, tingling, numbness in the hands or feet -problems with balance, talking, walking -trouble passing urine or change in the amount of urine Side effects that usually do not require medical attention (report to your doctor or health care professional if they continue or are bothersome): -hair loss -loss of appetite -metallic taste in the mouth or changes in taste This list may not describe all possible side effects. Call your doctor for medical advice about side effects. You may report side effects to FDA at 1-800-FDA-1088. Where should I keep my medicine? This drug is given in a hospital or clinic and will not be stored at home. NOTE: This sheet is a summary. It may not cover all possible information. If you have questions about this medicine, talk to your doctor, pharmacist, or health care provider.  2018 Elsevier/Gold Standard (2007-06-07  14:38:05)

## 2016-11-13 ENCOUNTER — Telehealth: Payer: Self-pay | Admitting: *Deleted

## 2016-11-13 NOTE — Telephone Encounter (Signed)
-----   Message from Murvin Natal, RN sent at 11/12/2016  4:28 PM EDT ----- Regarding: Dr. Alvy Bimler first time Taxol and Carboplatin Pt of Dr. Alvy Bimler first time Taxol and Carboplatin, pt tolerated treatment well.

## 2016-11-13 NOTE — Telephone Encounter (Signed)
Left message for chemo follow up. To call if has questions or concerns.

## 2016-11-17 ENCOUNTER — Telehealth: Payer: Self-pay | Admitting: *Deleted

## 2016-11-17 ENCOUNTER — Other Ambulatory Visit: Payer: Self-pay | Admitting: Hematology and Oncology

## 2016-11-17 ENCOUNTER — Telehealth: Payer: Self-pay

## 2016-11-17 MED ORDER — MORPHINE SULFATE 15 MG PO TABS: 15.0000 mg | ORAL_TABLET | ORAL | 0 refills | Status: DC | PRN

## 2016-11-17 MED FILL — MORPHINE SULFATE IR 15 MG T: 15 | 5 days supply | Qty: 30 | Fill #0

## 2016-11-17 NOTE — Telephone Encounter (Signed)
Pt called Sunday and Monday. Returned call. She is having a hard time walking d/t the pain. She tx self with hydrocodone 5-325 leftover from prior needs. plus 800 ibuprofen and a glass of wine. She did this Sunday and Monday.  The pain is like hot and cold being slapped into her feet and legs, and hands and chest. It feels like her whole body is on fire and numb. She is shakey where it is hard to get to bathroom. She took temp as we were talking on phone=99.9. She has been sweating a lot this AM.     She had difficulty breathing Sunday and Monday, is better today but not back where she would want it to be.  She is drinking a lot of water 4- 16oz bottles. She is nauseated and is eating small amounts.  She is using her compazine and zofran.  D1C1 taxol carbo on Thursday.

## 2016-11-17 NOTE — Telephone Encounter (Signed)
The pain is from neuropathy; will reduce treatment dose next visit The sweating is not related, could be due to menopausal symptoms If pain is not controlled with current dose hydrocodone, I can give her something stronger. Let me know Reinforce the use of anti-emetics and hydration

## 2016-11-17 NOTE — Telephone Encounter (Signed)
Pt called back. States she does need something stronger for pain.

## 2016-11-17 NOTE — Telephone Encounter (Signed)
Notified that Dr Alvy Bimler has written a prescription for [facility]

## 2016-11-17 NOTE — Telephone Encounter (Signed)
Informed patient of message below. Will call us if she thinks she needs something stronger

## 2016-11-20 ENCOUNTER — Telehealth: Payer: Self-pay | Admitting: *Deleted

## 2016-11-20 NOTE — Telephone Encounter (Signed)
Notified of message below. States she has enough to last the weekend. Will call when needs refill

## 2016-11-20 NOTE — Telephone Encounter (Signed)
Danielle Guzman called to let Dr Alvy Bimler know that her pain medicine is lasting about 2 hours. She taking it every 4 hours. Still feels "shaky, my hands, fingers and feet are numb and painful". States she is scared of her next treatment even though she knows Dr Alvy Bimler is going to reduce the dose.  States her "breath is getting better, not as good as I want yet"

## 2016-11-20 NOTE — Telephone Encounter (Signed)
Please tell her I will explain dose adjustment with her next time.  She may increase the pain medicine to 2 tablets each time. I can reassess next visit. Does she have enough to last though the weekend? If not, I will increase to 30 mg

## 2016-11-23 ENCOUNTER — Telehealth: Payer: Self-pay | Admitting: *Deleted

## 2016-11-23 ENCOUNTER — Other Ambulatory Visit: Payer: Self-pay | Admitting: Hematology and Oncology

## 2016-11-23 MED ORDER — MORPHINE SULFATE 15 MG PO TABS: 15.0000 mg | ORAL_TABLET | ORAL | 0 refills | Status: DC | PRN

## 2016-11-23 MED FILL — MORPHINE SULFATE IR 15 MG T: 15 | 15 days supply | Qty: 90 | Fill #0

## 2016-11-23 NOTE — Telephone Encounter (Signed)
I will refill 15 mg She may start taper herself with 1/2 tablet plus tylenol if pain is better

## 2016-11-23 NOTE — Telephone Encounter (Signed)
Danielle Guzman reports her pain is better using the MSIR 15 mg tablets- 2 tablets every 4-5 hours. Needs refill, but is thinking about decreasing back to 1 tablet instead of 2 tablets unless necessary.

## 2016-12-02 ENCOUNTER — Ambulatory Visit (HOSPITAL_BASED_OUTPATIENT_CLINIC_OR_DEPARTMENT_OTHER): Payer: 59

## 2016-12-02 ENCOUNTER — Ambulatory Visit: Payer: 59

## 2016-12-02 ENCOUNTER — Other Ambulatory Visit: Payer: Self-pay

## 2016-12-02 ENCOUNTER — Telehealth: Payer: Self-pay | Admitting: Hematology and Oncology

## 2016-12-02 ENCOUNTER — Encounter: Payer: Self-pay | Admitting: Hematology and Oncology

## 2016-12-02 ENCOUNTER — Other Ambulatory Visit (HOSPITAL_BASED_OUTPATIENT_CLINIC_OR_DEPARTMENT_OTHER): Payer: 59

## 2016-12-02 ENCOUNTER — Ambulatory Visit (HOSPITAL_BASED_OUTPATIENT_CLINIC_OR_DEPARTMENT_OTHER): Payer: 59 | Admitting: Hematology and Oncology

## 2016-12-02 DIAGNOSIS — G620 Drug-induced polyneuropathy: Secondary | ICD-10-CM | POA: Diagnosis not present

## 2016-12-02 DIAGNOSIS — R300 Dysuria: Secondary | ICD-10-CM | POA: Diagnosis not present

## 2016-12-02 DIAGNOSIS — R110 Nausea: Secondary | ICD-10-CM

## 2016-12-02 DIAGNOSIS — Z5111 Encounter for antineoplastic chemotherapy: Secondary | ICD-10-CM | POA: Diagnosis not present

## 2016-12-02 DIAGNOSIS — Z95828 Presence of other vascular implants and grafts: Secondary | ICD-10-CM | POA: Insufficient documentation

## 2016-12-02 DIAGNOSIS — T451X5A Adverse effect of antineoplastic and immunosuppressive drugs, initial encounter: Secondary | ICD-10-CM

## 2016-12-02 DIAGNOSIS — C541 Malignant neoplasm of endometrium: Secondary | ICD-10-CM | POA: Diagnosis not present

## 2016-12-02 DIAGNOSIS — C7982 Secondary malignant neoplasm of genital organs: Secondary | ICD-10-CM

## 2016-12-02 LAB — URINALYSIS, MICROSCOPIC - CHCC
Bilirubin (Urine): NEGATIVE
GLUCOSE UR CHCC: NEGATIVE mg/dL
KETONES: NEGATIVE mg/dL
NITRITE: NEGATIVE
PROTEIN: NEGATIVE mg/dL
Specific Gravity, Urine: 1.005 (ref 1.003–1.035)
Urobilinogen, UR: 0.2 mg/dL (ref 0.2–1)
pH: 5 (ref 4.6–8.0)

## 2016-12-02 LAB — COMPREHENSIVE METABOLIC PANEL
ALT: 28 U/L (ref 0–55)
ANION GAP: 10 meq/L (ref 3–11)
AST: 21 U/L (ref 5–34)
Albumin: 3.8 g/dL (ref 3.5–5.0)
Alkaline Phosphatase: 136 U/L (ref 40–150)
BILIRUBIN TOTAL: 0.35 mg/dL (ref 0.20–1.20)
BUN: 15.4 mg/dL (ref 7.0–26.0)
CALCIUM: 9.7 mg/dL (ref 8.4–10.4)
CHLORIDE: 105 meq/L (ref 98–109)
CO2: 24 mEq/L (ref 22–29)
CREATININE: 0.8 mg/dL (ref 0.6–1.1)
EGFR: 81 mL/min/{1.73_m2} — ABNORMAL LOW (ref >90)
Glucose: 91 mg/dl (ref 70–140)
Potassium: 4.3 mEq/L (ref 3.5–5.1)
Sodium: 140 mEq/L (ref 136–145)
TOTAL PROTEIN: 7 g/dL (ref 6.4–8.3)

## 2016-12-02 LAB — CBC WITH DIFFERENTIAL/PLATELET
BASO%: 0.5 % (ref 0.0–2.0)
Basophils Absolute: 0 10*3/uL (ref 0.0–0.1)
EOS%: 3.5 % (ref 0.0–7.0)
Eosinophils Absolute: 0.3 10*3/uL (ref 0.0–0.5)
HCT: 40.3 % (ref 34.8–46.6)
HGB: 13.4 g/dL (ref 11.6–15.9)
LYMPH%: 22.7 % (ref 14.0–49.7)
MCH: 26.6 pg (ref 25.1–34.0)
MCHC: 33.1 g/dL (ref 31.5–36.0)
MCV: 80.2 fL (ref 79.5–101.0)
MONO#: 0.5 10*3/uL (ref 0.1–0.9)
MONO%: 6.3 % (ref 0.0–14.0)
NEUT%: 67 % (ref 38.4–76.8)
NEUTROS ABS: 5.2 10*3/uL (ref 1.5–6.5)
Platelets: 222 10*3/uL (ref 145–400)
RBC: 5.03 10*6/uL (ref 3.70–5.45)
RDW: 14.5 % (ref 11.2–14.5)
WBC: 7.8 10*3/uL (ref 3.9–10.3)
lymph#: 1.8 10*3/uL (ref 0.9–3.3)

## 2016-12-02 MED ORDER — PACLITAXEL CHEMO INJECTION 300 MG/50ML
131.2500 mg/m2 | Freq: Once | INTRAVENOUS | Status: AC
  Administered 2016-12-02: 276 mg via INTRAVENOUS
  Filled 2016-12-02: qty 46

## 2016-12-02 MED ORDER — SODIUM CHLORIDE 0.9 % IV SOLN
650.0000 mg | Freq: Once | INTRAVENOUS | Status: AC
  Administered 2016-12-02: 650 mg via INTRAVENOUS
  Filled 2016-12-02: qty 65

## 2016-12-02 MED ORDER — DIPHENHYDRAMINE HCL 50 MG/ML IJ SOLN
INTRAMUSCULAR | Status: AC
  Filled 2016-12-02: qty 1

## 2016-12-02 MED ORDER — PALONOSETRON HCL INJECTION 0.25 MG/5ML
0.2500 mg | Freq: Once | INTRAVENOUS | Status: AC
  Administered 2016-12-02: 0.25 mg via INTRAVENOUS

## 2016-12-02 MED ORDER — SODIUM CHLORIDE 0.9% FLUSH
10.0000 mL | INTRAVENOUS | Status: DC | PRN
  Administered 2016-12-02: 10 mL
  Filled 2016-12-02: qty 10

## 2016-12-02 MED ORDER — SODIUM CHLORIDE 0.9 % IV SOLN
Freq: Once | INTRAVENOUS | Status: AC
  Administered 2016-12-02: 11:00:00 via INTRAVENOUS
  Filled 2016-12-02: qty 5

## 2016-12-02 MED ORDER — CIPROFLOXACIN HCL 250 MG PO TABS: 250.0000 mg | ORAL_TABLET | Freq: Two times a day (BID) | ORAL | 0 refills | Status: AC

## 2016-12-02 MED ORDER — DIPHENHYDRAMINE HCL 50 MG/ML IJ SOLN
50.0000 mg | Freq: Once | INTRAMUSCULAR | Status: AC
  Administered 2016-12-02: 50 mg via INTRAVENOUS

## 2016-12-02 MED ORDER — HEPARIN SOD (PORK) LOCK FLUSH 100 UNIT/ML IV SOLN
500.0000 [IU] | Freq: Once | INTRAVENOUS | Status: AC | PRN
  Administered 2016-12-02: 500 [IU]
  Filled 2016-12-02: qty 5

## 2016-12-02 MED ORDER — SODIUM CHLORIDE 0.9 % IV SOLN
Freq: Once | INTRAVENOUS | Status: AC
  Administered 2016-12-02: 11:00:00 via INTRAVENOUS

## 2016-12-02 MED ORDER — GABAPENTIN 300 MG PO CAPS: 300.0000 mg | ORAL_CAPSULE | Freq: Three times a day (TID) | ORAL | 1 refills | Status: DC

## 2016-12-02 MED ORDER — SODIUM CHLORIDE 0.9% FLUSH
10.0000 mL | Freq: Once | INTRAVENOUS | Status: AC
  Administered 2016-12-02: 10 mL
  Filled 2016-12-02: qty 10

## 2016-12-02 MED ORDER — PALONOSETRON HCL INJECTION 0.25 MG/5ML
INTRAVENOUS | Status: AC
  Filled 2016-12-02: qty 5

## 2016-12-02 MED ORDER — FAMOTIDINE IN NACL 20-0.9 MG/50ML-% IV SOLN
20.0000 mg | Freq: Once | INTRAVENOUS | Status: AC
  Administered 2016-12-02: 20 mg via INTRAVENOUS

## 2016-12-02 MED ORDER — FAMOTIDINE IN NACL 20-0.9 MG/50ML-% IV SOLN
INTRAVENOUS | Status: AC
  Filled 2016-12-02: qty 50

## 2016-12-02 MED FILL — CIPROFLOXACIN HCL 250 MG TA: 250 | 3 days supply | Qty: 6 | Fill #0

## 2016-12-02 MED FILL — GABAPENTIN 300 MG CAPSULE: 300 | 30 days supply | Qty: 90 | Fill #0

## 2016-12-02 NOTE — Assessment & Plan Note (Signed)
She had recent chemotherapy induced nausea She was taking Zofran and Compazine as needed I recommend daily dose of dexamethasone for 2 days after treatment

## 2016-12-02 NOTE — Progress Notes (Signed)
Pt c/o numbness and tingling in arms and legs with burning of the lower abdomen. Taxol paused at 1324. Discussed with Dr. Alvy Bimler who is aware of this side effect for the pt. Based on conversation with MD this is not a new symptom for the pt. Gabapentin ordered for symptom management. Dr. Alvy Bimler believes that it is in the patient's best interest to continue taxol infusion despite side effect, unless pt refused or felt there was a serious reason to not continue. Discussed with pt who was in agreement to restart medication. Taxol restarted at 1339.   At approximately 1520, pt voiced burning and head pain that she experienced during last treatment on 8/30. Dr. Alvy Bimler aware of symptom. Offered pt ice for head and she said that symptom lessened with the ice and was in agreement to continue. No further c/o symptom.

## 2016-12-02 NOTE — Assessment & Plan Note (Signed)
She has symptom of dysuria I recommend urinalysis and urine culture Preliminary results suggest possible UTI I will start her on ciprofloxacin

## 2016-12-02 NOTE — Assessment & Plan Note (Signed)
The patient experience significant neuropathic pain from prior chemotherapy I recommend dose adjustment We will proceed with treatment today without delay

## 2016-12-02 NOTE — Assessment & Plan Note (Signed)
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

## 2016-12-02 NOTE — Progress Notes (Signed)
[facility] OFFICE PROGRESS NOTE  Patient Care Team: [place], [place] as PCP - General (Family Medicine)  SUMMARY OF ONCOLOGIC HISTORY:   Endometrial cancer ([place])   08/11/2016 Procedure    Procedure: hysteroscopy, polypectomy and curettage with MyoSure       08/13/2016 Pathology Results    Endometrium, curettage, and endometrial polyp - ENDOMETRIAL CARCINOMA, SEE COMMENT. 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 There is a small focus (5%) of clear cell carcinoma and a few foci suspicious for serous carcinoma.       08/27/2016 Imaging    Ct scan of abdomen 1. No findings of adenopathy or metastatic disease in the abdomen or pelvis. 2. 2 by 3 mm peripheral right lower lobe pulmonary nodule on image 11/7. Highly likely to be benign based on morphology      09/01/2016 Pathology Results    1. Lymph node, sentinel, biopsy, right external iliac - ONE BENIGN LYMPH NODE (0/1). 2. Lymph node, sentinel, biopsy, right obturator - ONE BENIGN LYMPH NODE (0/1). 3. Lymph node, sentinel, biopsy, distal left external iliac - ONE BENIGN LYMPH NODE (0/1). 4. Lymph node, sentinel, biopsy, left proximal external iliac - ONE BENIGN LYMPH NODE (0/1). 5. Uterus +/- tubes/ovaries, neoplastic, cervix - ENDOMETRIAL ADENOCARCINOMA. - LUMINAL INVOLVEMENT OF RIGHT AND LEFT FALLOPIAN TUBES. - CERVIX AND BILATERAL OVARIES FREE OF TUMOR. - SEE ONCOLOGY TABLE AND COMMENT. Microscopic Comment 1. - 4. Immunohistochemistry for cytokeratin AE1/AE3 is performed on all of the lymph nodes (parts 1-4) and no positivity is identified. 5. ONCOLOGY TABLE-UTERUS, CARCINOMA OR CARCINOSARCOMA  Specimen: Uterus with bilateral fallopian tubes and ovaries with right and left pelvic lymph nodes Procedure: Hysterectomy with bilateral salpingo-oophorectomy Lymph node sampling performed: Yes Specimen integrity:  Intact Maximum tumor size: 1.8 cm Histologic type: Endometrial adenocarcinoma, see comment. Grade: II Myometrial invasion: 0 cm where myometrium is 1.1 cm in thickness Cervical stromal involvement: No Extent of involvement of other organs: Luminal involvement of right and left fallopian tubes, see comment. Lymph - vascular invasion: Not identified  Peritoneal washings: N/A Lymph nodes: Examined: 4 Sentinel 0 Non-sentinel 4 Total Lymph nodes with metastasis: 0 Isolated tumor cells (< 0.2 mm): 0 Micrometastasis: (> 0.2 mm and < 2.0 mm): 0 Macrometastasis: (> 2.0 mm): 0 Extracapsular extension: N/A Pelvic lymph nodes: 0 involved of 4 lymph nodes. Para-aortic lymph nodes: 0 involved of 0 lymph nodes. Other (specify involvement and site): N/A TNM code: pT3a, pN0 FIGO Stage (based on pathologic findings, needs clinical correlation): III-A  Comment: The endometrial tumor is a moderately differentiated mostly endometrioid adenocarcinoma with rare foci of squamous differentiation and a rare microscopic focus consistent with serous differentiation. There is also a microscopic focus with chondroid stroma which is associated with an adjacent microscopic serous component. This finding suggests a microscopic stromal component which is less than 1 mm in greatest dimension. In addition, there are luminal foci of carcinoma within the right and fallopian tubes and the right fallopian tube lumen involvement shows a focus of similar appearing chondroid stroma with adjacent microscopic focus of serous carcinoma.      09/01/2016 Surgery    Surgeon: Donaciano Eva   Operation: Robotic-assisted laparoscopic total hysterectomy with bilateral salpingoophorectomy, Sentinel lymph node biopsy  Operative Findings:  : 6cm normal appearing uterus, normal tubes and ovaries, no suspicious lymph nodes, no apparent peritoneal disease.       11/10/2016 Procedure    Successful placement  of a right IJ  approach Power Port with ultrasound and fluoroscopic guidance. The catheter is ready for use.      11/12/2016 -  Chemotherapy    She received carboplatin & Taxol      12/02/2016 Adverse Reaction    She had severe neuropathy from cycle 1 of treatment. Dose of chemo reduced from cycle 2 onwards       INTERVAL HISTORY: Please see below for problem oriented charting. She is seen prior to cycle 2 of treatment She complain of severe burning peripheral neuropathy after cycle 1 of treatment She also has some dysuria She had some nausea for the first week of treatment Denies constipation Denies fever or chills  REVIEW OF SYSTEMS:   Constitutional: Denies fevers, chills or abnormal weight loss Eyes: Denies blurriness of vision Ears, nose, mouth, throat, and face: Denies mucositis or sore throat Respiratory: Denies cough, dyspnea or wheezes Cardiovascular: Denies palpitation, chest discomfort or lower extremity swelling Gastrointestinal:  Denies nausea, heartburn or change in bowel habits Skin: Denies abnormal skin rashes Lymphatics: Denies new lymphadenopathy or easy bruising Neurological:Denies numbness, tingling or new weaknesses Behavioral/Psych: Mood is stable, no new changes  All other systems were reviewed with the patient and are negative.  I have reviewed the past medical history, past surgical history, social history and family history with the patient and they are unchanged from previous note.  ALLERGIES:  is allergic to latex and tamiflu  [oseltamivir phosphate].  MEDICATIONS:  Current Outpatient Prescriptions  Medication Sig Dispense Refill  . Ascorbic Acid (VITAMIN C PO) Take 1 tablet by mouth 3 (three) times daily as needed (for immune health support or cold symptoms.).    Marland Kitchen ciprofloxacin (CIPRO) 250 MG tablet Take 1 tablet (250 mg total) by mouth 2 (two) times daily. 6 tablet 0  . dexamethasone (DECADRON) 4 MG tablet Take 5 tabs the night before scheduled chemo and  another 5 tabs at 6 am the day of chemo, every 21 days 30 tablet 1  . ECHINACEA HERB PO Take by mouth.    . Flaxseed Oil OIL Take by mouth.    . gabapentin (NEURONTIN) 300 MG capsule Take 1 capsule (300 mg total) by mouth 3 (three) times daily. 90 capsule 1  . lidocaine-prilocaine (EMLA) cream Apply to affected area once 30 g 3  . morphine (MSIR) 15 MG tablet Take 1 tablet (15 mg total) by mouth every 4 (four) hours as needed for severe pain. 90 tablet 0  . ondansetron (ZOFRAN) 8 MG tablet Take 1 tablet (8 mg total) by mouth 2 (two) times daily as needed for refractory nausea / vomiting. Start on day 3 after chemo. 30 tablet 1  . prochlorperazine (COMPAZINE) 10 MG tablet Take 1 tablet (10 mg total) by mouth every 6 (six) hours as needed (Nausea or vomiting). 30 tablet 1  . saccharomyces boulardii (FLORASTOR) 250 MG capsule Take 250 mg by mouth daily.     No current facility-administered medications for this visit.    Facility-Administered Medications Ordered in Other Visits  Medication Dose Route Frequency Provider Last Rate Last Dose  . CARBOplatin (PARAPLATIN) 650 mg in sodium chloride 0.9 % 250 mL chemo infusion  650 mg Intravenous Once Alvy Bimler, Jupiter Kabir, MD      . heparin lock flush 100 unit/mL  500 Units Intracatheter Once PRN Alvy Bimler, Sajid Ruppert, MD      . PACLitaxel (TAXOL) 276 mg in dextrose 5 % 250 mL chemo infusion (> 80mg /m2)  131.25 mg/m2 (Treatment Plan  Recorded) Intravenous Once Alvy Bimler, Kedron Uno, MD      . sodium chloride flush (NS) 0.9 % injection 10 mL  10 mL Intracatheter PRN Alvy Bimler, Mardene Lessig, MD        PHYSICAL EXAMINATION: ECOG PERFORMANCE STATUS: 2 - Symptomatic, <50% confined to bed  Vitals:   12/02/16 0924  BP: 134/85  Pulse: 76  Resp: 18  Temp: 98.7 F (37.1 C)  SpO2: 100%   Filed Weights   12/02/16 0924  Weight: 199 lb 14.4 oz (90.7 kg)    GENERAL:alert, no distress and comfortable SKIN: skin color, texture, turgor are normal, no rashes or significant lesions EYES: normal,  Conjunctiva are pink and non-injected, sclera clear OROPHARYNX:no exudate, no erythema and lips, buccal mucosa, and tongue normal  NECK: supple, thyroid normal size, non-tender, without nodularity LYMPH:  no palpable lymphadenopathy in the cervical, axillary or inguinal LUNGS: clear to auscultation and percussion with normal breathing effort HEART: regular rate & rhythm and no murmurs and no lower extremity edema ABDOMEN:abdomen soft, non-tender and normal bowel sounds Musculoskeletal:no cyanosis of digits and no clubbing  NEURO: alert & oriented x 3 with fluent speech, no focal motor/sensory deficits  LABORATORY DATA:  I have reviewed the data as listed    Component Value Date/Time   NA 140 12/02/2016 0858   K 4.3 12/02/2016 0858   CL 105 11/10/2016 1237   CO2 24 12/02/2016 0858   GLUCOSE 91 12/02/2016 0858   BUN 15.4 12/02/2016 0858   CREATININE 0.8 12/02/2016 0858   CALCIUM 9.7 12/02/2016 0858   PROT 7.0 12/02/2016 0858   ALBUMIN 3.8 12/02/2016 0858   AST 21 12/02/2016 0858   ALT 28 12/02/2016 0858   ALKPHOS 136 12/02/2016 0858   BILITOT 0.35 12/02/2016 0858   GFRNONAA >60 11/10/2016 1237   GFRAA >60 11/10/2016 1237    No results found for: SPEP, UPEP  Lab Results  Component Value Date   WBC 7.8 12/02/2016   NEUTROABS 5.2 12/02/2016   HGB 13.4 12/02/2016   HCT 40.3 12/02/2016   MCV 80.2 12/02/2016   PLT 222 12/02/2016      Chemistry      Component Value Date/Time   NA 140 12/02/2016 0858   K 4.3 12/02/2016 0858   CL 105 11/10/2016 1237   CO2 24 12/02/2016 0858   BUN 15.4 12/02/2016 0858   CREATININE 0.8 12/02/2016 0858      Component Value Date/Time   CALCIUM 9.7 12/02/2016 0858   ALKPHOS 136 12/02/2016 0858   AST 21 12/02/2016 0858   ALT 28 12/02/2016 0858   BILITOT 0.35 12/02/2016 0858       RADIOGRAPHIC STUDIES: I have personally reviewed the radiological images as listed and agreed with the findings in the report. Ir US Guide Vasc Access  Right  Result Date: 11/10/2016 INDICATION: 51 year old female with a history of endometrial cancer. She presents for port catheter placement for chemotherapy. EXAM: IMPLANTED PORT A CATH PLACEMENT WITH ULTRASOUND AND FLUOROSCOPIC GUIDANCE MEDICATIONS: 2 g Ancef; The antibiotic was administered within an appropriate time interval prior to skin puncture. ANESTHESIA/SEDATION: Versed 2 mg IV; Fentanyl 100 mcg IV; Moderate Sedation Time:  19 minutes The patient was continuously monitored during the procedure by the interventional radiology nurse under my direct supervision. FLUOROSCOPY TIME:  0 minutes, 24 seconds (4 mGy) COMPLICATIONS: None immediate. PROCEDURE: The right neck and chest was prepped with chlorhexidine, and draped in the usual sterile fashion using maximum barrier technique (cap and mask, sterile gown, sterile gloves,  large sterile sheet, hand hygiene and cutaneous antiseptic). Antibiotic prophylaxis was provided with 2g Ancef administered IV one hour prior to skin incision. Local anesthesia was attained by infiltration with 1% lidocaine with epinephrine. Ultrasound demonstrated patency of the right internal jugular vein, and this was documented with an image. Under real-time ultrasound guidance, this vein was accessed with a 21 gauge micropuncture needle and image documentation was performed. A small dermatotomy was made at the access site with an 11 scalpel. A 0.018" wire was advanced into the SVC and the access needle exchanged for a 65F micropuncture vascular sheath. The 0.018" wire was then removed and a 0.035" wire advanced into the IVC. An appropriate location for the subcutaneous reservoir was selected below the clavicle and an incision was made through the skin and underlying soft tissues. The subcutaneous tissues were then dissected using a combination of blunt and sharp surgical technique and a pocket was formed. A single lumen power injectable portacatheter was then tunneled through the  subcutaneous tissues from the pocket to the dermatotomy and the port reservoir placed within the subcutaneous pocket. The venous access site was then serially dilated and a peel away vascular sheath placed over the wire. The wire was removed and the port catheter advanced into position under fluoroscopic guidance. The catheter tip is positioned in the upper right atrium. This was documented with a spot image. The portacatheter was then tested and found to flush and aspirate well. The port was flushed with saline followed by 100 units/mL heparinized saline. The pocket was then closed in two layers using first subdermal inverted interrupted absorbable sutures followed by a running subcuticular suture. The epidermis was then sealed with Dermabond. The dermatotomy at the venous access site was also closed with a single inverted subdermal suture and the epidermis sealed with Dermabond. IMPRESSION: Successful placement of a right IJ approach Power Port with ultrasound and fluoroscopic guidance. The catheter is ready for use. Signed, Criselda Peaches, MD [facility] Radiology Electronically Signed   By: Jacqulynn Cadet M.D.   On: 11/10/2016 16:03   Ir Fluoro Guide Port Insertion Right  Result Date: 11/10/2016 INDICATION: 51 year old female with a history of endometrial cancer. She presents for port catheter placement for chemotherapy. EXAM: IMPLANTED PORT A CATH PLACEMENT WITH ULTRASOUND AND FLUOROSCOPIC GUIDANCE MEDICATIONS: 2 g Ancef; The antibiotic was administered within an appropriate time interval prior to skin puncture. ANESTHESIA/SEDATION: Versed 2 mg IV; Fentanyl 100 mcg IV; Moderate Sedation Time:  19 minutes The patient was continuously monitored during the procedure by the interventional radiology nurse under my direct supervision. FLUOROSCOPY TIME:  0 minutes, 24 seconds (4 mGy) COMPLICATIONS: None immediate. PROCEDURE: The right neck and chest was  prepped with chlorhexidine, and draped in the usual sterile fashion using maximum barrier technique (cap and mask, sterile gown, sterile gloves, large sterile sheet, hand hygiene and cutaneous antiseptic). Antibiotic prophylaxis was provided with 2g Ancef administered IV one hour prior to skin incision. Local anesthesia was attained by infiltration with 1% lidocaine with epinephrine. Ultrasound demonstrated patency of the right internal jugular vein, and this was documented with an image. Under real-time ultrasound guidance, this vein was accessed with a 21 gauge micropuncture needle and image documentation was performed. A small dermatotomy was made at the access site with an 11 scalpel. A 0.018" wire was advanced into the SVC and the access needle exchanged for a 65F micropuncture vascular sheath. The 0.018" wire was then removed and a 0.035" wire advanced  into the IVC. An appropriate location for the subcutaneous reservoir was selected below the clavicle and an incision was made through the skin and underlying soft tissues. The subcutaneous tissues were then dissected using a combination of blunt and sharp surgical technique and a pocket was formed. A single lumen power injectable portacatheter was then tunneled through the subcutaneous tissues from the pocket to the dermatotomy and the port reservoir placed within the subcutaneous pocket. The venous access site was then serially dilated and a peel away vascular sheath placed over the wire. The wire was removed and the port catheter advanced into position under fluoroscopic guidance. The catheter tip is positioned in the upper right atrium. This was documented with a spot image. The portacatheter was then tested and found to flush and aspirate well. The port was flushed with saline followed by 100 units/mL heparinized saline. The pocket was then closed in two layers using first subdermal inverted interrupted absorbable sutures followed by a running subcuticular  suture. The epidermis was then sealed with Dermabond. The dermatotomy at the venous access site was also closed with a single inverted subdermal suture and the epidermis sealed with Dermabond. IMPRESSION: Successful placement of a right IJ approach Power Port with ultrasound and fluoroscopic guidance. The catheter is ready for use. Signed, Criselda Peaches, MD [facility] Radiology Electronically Signed   By: Jacqulynn Cadet M.D.   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 Urine Culture    Standing Status:   Future    Number of Occurrences:   1    Standing Expiration Date:   01/06/2018  . Urinalysis, Microscopic - [facility]    Standing Status:   Future    Number of Occurrences:   1    Standing Expiration Date:   01/06/2018   All questions were answered. The patient knows to call the clinic with any problems, questions or concerns. No barriers to learning was detected. I spent 25 minutes counseling the patient face to face. The total time spent in the appointment was 40 minutes and more than 50% was on counseling and review of test results     Heath Lark, MD 12/02/2016 11:56 AM

## 2016-12-02 NOTE — Telephone Encounter (Signed)
Gave avs and calendar for october °

## 2016-12-02 NOTE — Patient Instructions (Addendum)
[facility] Discharge Instructions for Patients Receiving Chemotherapy  Today you received the following chemotherapy agents: paclitaxel (Taxol) and Carboplatin   To help prevent nausea and vomiting after your treatment, we encourage you to take your nausea medication as directed.    If you develop nausea and vomiting that is not controlled by your nausea medication, call the clinic.   BELOW ARE SYMPTOMS THAT SHOULD BE REPORTED IMMEDIATELY:  *FEVER GREATER THAN 100.5 F  *CHILLS WITH OR WITHOUT FEVER  NAUSEA AND VOMITING THAT IS NOT CONTROLLED WITH YOUR NAUSEA MEDICATION  *UNUSUAL SHORTNESS OF BREATH  *UNUSUAL BRUISING OR BLEEDING  TENDERNESS IN MOUTH AND THROAT WITH OR WITHOUT PRESENCE OF ULCERS  *URINARY PROBLEMS  *BOWEL PROBLEMS  UNUSUAL RASH Items with * indicate a potential emergency and should be followed up as soon as possible.  Feel free to call the clinic you have any questions or concerns. The clinic phone number is [phone].  Please show the CHEMO ALERT CARD at check-in to the Emergency Department and triage nurse.   

## 2016-12-04 LAB — URINE CULTURE

## 2016-12-07 ENCOUNTER — Telehealth: Payer: Self-pay | Admitting: *Deleted

## 2016-12-07 NOTE — Telephone Encounter (Signed)
LM with note below 

## 2016-12-07 NOTE — Telephone Encounter (Signed)
Pt returned call to desk nurse.  Informed pt of urine culture negative for UTI as per Dr. Alvy Bimler' s instructions.  Pt stated no further questions at this time.

## 2016-12-07 NOTE — Telephone Encounter (Signed)
-----   Message from Heath Lark, MD sent at 12/07/2016 12:39 PM EDT ----- Regarding: urine culture neg Can you call and ask how she is? Urine culture is negative for UTI

## 2016-12-15 ENCOUNTER — Other Ambulatory Visit: Payer: Self-pay | Admitting: Hematology and Oncology

## 2016-12-15 ENCOUNTER — Telehealth: Payer: Self-pay

## 2016-12-15 MED ORDER — MORPHINE SULFATE 15 MG PO TABS
15.0000 mg | ORAL_TABLET | ORAL | 0 refills | Status: DC | PRN
Start: 2016-12-15 — End: 2016-12-23

## 2016-12-15 MED FILL — MORPHINE SULFATE IR 15 MG T: 15 | 15 days supply | Qty: 90 | Fill #0

## 2016-12-15 NOTE — Telephone Encounter (Signed)
Not sure, she is instructed to only start BID for now Will reassess in her next visit

## 2016-12-15 NOTE — Telephone Encounter (Signed)
Patient called for refill on Morphine Rx. She wants Dr. Alvy Bimler to know that since she started the Gabapentin she has started feeling dizzy and seeing small black spots in her vision, she is not sure if it has anything to do with the Gabapentin.

## 2016-12-15 NOTE — Telephone Encounter (Signed)
Called with below message. 

## 2016-12-23 ENCOUNTER — Ambulatory Visit (HOSPITAL_BASED_OUTPATIENT_CLINIC_OR_DEPARTMENT_OTHER): Payer: 59 | Admitting: Hematology and Oncology

## 2016-12-23 ENCOUNTER — Ambulatory Visit (HOSPITAL_BASED_OUTPATIENT_CLINIC_OR_DEPARTMENT_OTHER): Payer: 59

## 2016-12-23 ENCOUNTER — Telehealth: Payer: Self-pay | Admitting: Hematology and Oncology

## 2016-12-23 ENCOUNTER — Ambulatory Visit: Payer: 59

## 2016-12-23 ENCOUNTER — Telehealth: Payer: Self-pay

## 2016-12-23 ENCOUNTER — Encounter: Payer: Self-pay | Admitting: Hematology and Oncology

## 2016-12-23 ENCOUNTER — Other Ambulatory Visit (HOSPITAL_BASED_OUTPATIENT_CLINIC_OR_DEPARTMENT_OTHER): Payer: 59

## 2016-12-23 DIAGNOSIS — Z95828 Presence of other vascular implants and grafts: Secondary | ICD-10-CM

## 2016-12-23 DIAGNOSIS — Z5111 Encounter for antineoplastic chemotherapy: Secondary | ICD-10-CM

## 2016-12-23 DIAGNOSIS — T451X5A Adverse effect of antineoplastic and immunosuppressive drugs, initial encounter: Secondary | ICD-10-CM

## 2016-12-23 DIAGNOSIS — C541 Malignant neoplasm of endometrium: Secondary | ICD-10-CM

## 2016-12-23 DIAGNOSIS — G620 Drug-induced polyneuropathy: Secondary | ICD-10-CM | POA: Diagnosis not present

## 2016-12-23 DIAGNOSIS — R739 Hyperglycemia, unspecified: Secondary | ICD-10-CM | POA: Diagnosis not present

## 2016-12-23 DIAGNOSIS — C7982 Secondary malignant neoplasm of genital organs: Secondary | ICD-10-CM

## 2016-12-23 DIAGNOSIS — T50905A Adverse effect of unspecified drugs, medicaments and biological substances, initial encounter: Secondary | ICD-10-CM | POA: Insufficient documentation

## 2016-12-23 LAB — COMPREHENSIVE METABOLIC PANEL
ALT: 28 U/L (ref 0–55)
AST: 16 U/L (ref 5–34)
Albumin: 3.9 g/dL (ref 3.5–5.0)
Alkaline Phosphatase: 130 U/L (ref 40–150)
Anion Gap: 12 mEq/L — ABNORMAL HIGH (ref 3–11)
BUN: 11.2 mg/dL (ref 7.0–26.0)
CALCIUM: 9.9 mg/dL (ref 8.4–10.4)
CHLORIDE: 105 meq/L (ref 98–109)
CO2: 21 mEq/L — ABNORMAL LOW (ref 22–29)
CREATININE: 0.8 mg/dL (ref 0.6–1.1)
EGFR: >60 mL/min/{1.73_m2} (ref >60)
GLUCOSE: 189 mg/dL — AB (ref 70–140)
POTASSIUM: 4 meq/L (ref 3.5–5.1)
SODIUM: 138 meq/L (ref 136–145)
Total Bilirubin: 0.35 mg/dL (ref 0.20–1.20)
Total Protein: 7.4 g/dL (ref 6.4–8.3)

## 2016-12-23 LAB — CBC WITH DIFFERENTIAL/PLATELET
BASO%: 0.1 % (ref 0.0–2.0)
BASOS ABS: 0 10*3/uL (ref 0.0–0.1)
EOS%: 0 % (ref 0.0–7.0)
Eosinophils Absolute: 0 10*3/uL (ref 0.0–0.5)
HEMATOCRIT: 38.3 % (ref 34.8–46.6)
HEMOGLOBIN: 12.7 g/dL (ref 11.6–15.9)
LYMPH#: 0.7 10*3/uL — AB (ref 0.9–3.3)
LYMPH%: 10.9 % — ABNORMAL LOW (ref 14.0–49.7)
MCH: 26.7 pg (ref 25.1–34.0)
MCHC: 33.3 g/dL (ref 31.5–36.0)
MCV: 80.2 fL (ref 79.5–101.0)
MONO#: 0.1 10*3/uL (ref 0.1–0.9)
MONO%: 1.1 % (ref 0.0–14.0)
NEUT#: 5.6 10*3/uL (ref 1.5–6.5)
NEUT%: 87.9 % — ABNORMAL HIGH (ref 38.4–76.8)
Platelets: 195 10*3/uL (ref 145–400)
RBC: 4.77 10*6/uL (ref 3.70–5.45)
RDW: 15.6 % — AB (ref 11.2–14.5)
WBC: 6.4 10*3/uL (ref 3.9–10.3)

## 2016-12-23 MED ORDER — DIPHENHYDRAMINE HCL 50 MG/ML IJ SOLN
50.0000 mg | Freq: Once | INTRAMUSCULAR | Status: AC
  Administered 2016-12-23: 50 mg via INTRAVENOUS

## 2016-12-23 MED ORDER — DIPHENHYDRAMINE HCL 50 MG/ML IJ SOLN
INTRAMUSCULAR | Status: AC
  Filled 2016-12-23: qty 1

## 2016-12-23 MED ORDER — MORPHINE SULFATE 15 MG PO TABS: 15.0000 mg | ORAL_TABLET | ORAL | 0 refills | Status: DC | PRN

## 2016-12-23 MED ORDER — SODIUM CHLORIDE 0.9 % IV SOLN
650.0000 mg | Freq: Once | INTRAVENOUS | Status: AC
  Administered 2016-12-23: 650 mg via INTRAVENOUS
  Filled 2016-12-23: qty 65

## 2016-12-23 MED ORDER — FAMOTIDINE IN NACL 20-0.9 MG/50ML-% IV SOLN
INTRAVENOUS | Status: AC
  Filled 2016-12-23: qty 50

## 2016-12-23 MED ORDER — PALONOSETRON HCL INJECTION 0.25 MG/5ML
INTRAVENOUS | Status: AC
  Filled 2016-12-23: qty 5

## 2016-12-23 MED ORDER — PACLITAXEL CHEMO INJECTION 300 MG/50ML
105.0000 mg/m2 | Freq: Once | INTRAVENOUS | Status: AC
  Administered 2016-12-23: 216 mg via INTRAVENOUS
  Filled 2016-12-23: qty 36

## 2016-12-23 MED ORDER — FAMOTIDINE IN NACL 20-0.9 MG/50ML-% IV SOLN
20.0000 mg | Freq: Once | INTRAVENOUS | Status: AC
Start: 2016-12-23 — End: 2016-12-23
  Administered 2016-12-23: 20 mg via INTRAVENOUS

## 2016-12-23 MED ORDER — SODIUM CHLORIDE 0.9 % IV SOLN
Freq: Once | INTRAVENOUS | Status: AC
  Administered 2016-12-23: 11:00:00 via INTRAVENOUS
  Filled 2016-12-23: qty 5

## 2016-12-23 MED ORDER — SODIUM CHLORIDE 0.9 % IV SOLN
Freq: Once | INTRAVENOUS | Status: AC
  Administered 2016-12-23: 11:00:00 via INTRAVENOUS

## 2016-12-23 MED ORDER — SODIUM CHLORIDE 0.9% FLUSH
10.0000 mL | Freq: Once | INTRAVENOUS | Status: AC
  Administered 2016-12-23: 10 mL
  Filled 2016-12-23: qty 10

## 2016-12-23 MED ORDER — PALONOSETRON HCL INJECTION 0.25 MG/5ML
0.2500 mg | Freq: Once | INTRAVENOUS | Status: AC
  Administered 2016-12-23: 0.25 mg via INTRAVENOUS

## 2016-12-23 MED ORDER — HEPARIN SOD (PORK) LOCK FLUSH 100 UNIT/ML IV SOLN
500.0000 [IU] | Freq: Once | INTRAVENOUS | Status: AC | PRN
  Administered 2016-12-23: 500 [IU]
  Filled 2016-12-23: qty 5

## 2016-12-23 MED ORDER — SODIUM CHLORIDE 0.9% FLUSH
10.0000 mL | INTRAVENOUS | Status: DC | PRN
  Administered 2016-12-23: 10 mL
  Filled 2016-12-23: qty 10

## 2016-12-23 MED FILL — DEXAMETHASONE 4 MG TABLET: 4 | 21 days supply | Qty: 30 | Fill #1

## 2016-12-23 MED FILL — MORPHINE SULFATE IR 15 MG T: 15 | 15 days supply | Qty: 90 | Fill #0

## 2016-12-23 NOTE — Telephone Encounter (Signed)
S/w Colletta Maryland proceed per Dr Alvy Bimler.

## 2016-12-23 NOTE — Assessment & Plan Note (Signed)
I recommend increasing the dose of gabapentin to 300 mg 3 times a day if tolerated I refill her prescription pain medicine Plan to reduce dose of Taxol

## 2016-12-23 NOTE — Telephone Encounter (Signed)
Gave avs and calendar for October and November  °

## 2016-12-23 NOTE — Telephone Encounter (Signed)
Pls proceed 

## 2016-12-23 NOTE — Assessment & Plan Note (Signed)
She continues to tolerate treatment poorly with significant neuropathy Plan to further reduce the dose of Taxol We will see her back in 3 weeks for further management

## 2016-12-23 NOTE — Telephone Encounter (Signed)
Colletta Maryland at [facility] called stating it is actually too soon to fill morphine but pt is in infusion at present and wants to fill early. Is this OK. Will save pt a trip from [place] when she is not feeling well from chemo. [facility] OP number [phone]

## 2016-12-23 NOTE — Patient Instructions (Signed)
   [facility] Discharge Instructions for Patients Receiving Chemotherapy  Today you received the following chemotherapy agents Taxol and Carboplatin   To help prevent nausea and vomiting after your treatment, we encourage you to take your nausea medication as directed.    If you develop nausea and vomiting that is not controlled by your nausea medication, call the clinic.   BELOW ARE SYMPTOMS THAT SHOULD BE REPORTED IMMEDIATELY:  *FEVER GREATER THAN 100.5 F  *CHILLS WITH OR WITHOUT FEVER  NAUSEA AND VOMITING THAT IS NOT CONTROLLED WITH YOUR NAUSEA MEDICATION  *UNUSUAL SHORTNESS OF BREATH  *UNUSUAL BRUISING OR BLEEDING  TENDERNESS IN MOUTH AND THROAT WITH OR WITHOUT PRESENCE OF ULCERS  *URINARY PROBLEMS  *BOWEL PROBLEMS  UNUSUAL RASH Items with * indicate a potential emergency and should be followed up as soon as possible.  Feel free to call the clinic should you have any questions or concerns. The clinic phone number is [phone].  Please show the CHEMO ALERT CARD at check-in to the Emergency Department and triage nurse.   

## 2016-12-23 NOTE — Assessment & Plan Note (Signed)
She has drug induced hyperglycemia Observe only for now

## 2016-12-23 NOTE — Progress Notes (Signed)
[place] OFFICE PROGRESS NOTE  Patient Care Team: [place], [place] as PCP - General (Family Medicine)  SUMMARY OF ONCOLOGIC HISTORY:   Endometrial cancer ([place])   08/11/2016 Procedure    Procedure: hysteroscopy, polypectomy and curettage with MyoSure       08/13/2016 Pathology Results    Endometrium, curettage, and endometrial polyp - ENDOMETRIAL CARCINOMA, SEE COMMENT. 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 There is a small focus (5%) of clear cell carcinoma and a few foci suspicious for serous carcinoma.       08/27/2016 Imaging    Ct scan of abdomen 1. No findings of adenopathy or metastatic disease in the abdomen or pelvis. 2. 2 by 3 mm peripheral right lower lobe pulmonary nodule on image 11/7. Highly likely to be benign based on morphology      09/01/2016 Pathology Results    1. Lymph node, sentinel, biopsy, right external iliac - ONE BENIGN LYMPH NODE (0/1). 2. Lymph node, sentinel, biopsy, right obturator - ONE BENIGN LYMPH NODE (0/1). 3. Lymph node, sentinel, biopsy, distal left external iliac - ONE BENIGN LYMPH NODE (0/1). 4. Lymph node, sentinel, biopsy, left proximal external iliac - ONE BENIGN LYMPH NODE (0/1). 5. Uterus +/- tubes/ovaries, neoplastic, cervix - ENDOMETRIAL ADENOCARCINOMA. - LUMINAL INVOLVEMENT OF RIGHT AND LEFT FALLOPIAN TUBES. - CERVIX AND BILATERAL OVARIES FREE OF TUMOR. - SEE ONCOLOGY TABLE AND COMMENT. Microscopic Comment 1. - 4. Immunohistochemistry for cytokeratin AE1/AE3 is performed on all of the lymph nodes (parts 1-4) and no positivity is identified. 5. ONCOLOGY TABLE-UTERUS, CARCINOMA OR CARCINOSARCOMA  Specimen: Uterus with bilateral fallopian tubes and ovaries with right and left pelvic lymph nodes Procedure: Hysterectomy with bilateral salpingo-oophorectomy Lymph node sampling performed: Yes Specimen integrity:  Intact Maximum tumor size: 1.8 cm Histologic type: Endometrial adenocarcinoma, see comment. Grade: II Myometrial invasion: 0 cm where myometrium is 1.1 cm in thickness Cervical stromal involvement: No Extent of involvement of other organs: Luminal involvement of right and left fallopian tubes, see comment. Lymph - vascular invasion: Not identified  Peritoneal washings: N/A Lymph nodes: Examined: 4 Sentinel 0 Non-sentinel 4 Total Lymph nodes with metastasis: 0 Isolated tumor cells (< 0.2 mm): 0 Micrometastasis: (> 0.2 mm and < 2.0 mm): 0 Macrometastasis: (> 2.0 mm): 0 Extracapsular extension: N/A Pelvic lymph nodes: 0 involved of 4 lymph nodes. Para-aortic lymph nodes: 0 involved of 0 lymph nodes. Other (specify involvement and site): N/A TNM code: pT3a, pN0 FIGO Stage (based on pathologic findings, needs clinical correlation): III-A  Comment: The endometrial tumor is a moderately differentiated mostly endometrioid adenocarcinoma with rare foci of squamous differentiation and a rare microscopic focus consistent with serous differentiation. There is also a microscopic focus with chondroid stroma which is associated with an adjacent microscopic serous component. This finding suggests a microscopic stromal component which is less than 1 mm in greatest dimension. In addition, there are luminal foci of carcinoma within the right and fallopian tubes and the right fallopian tube lumen involvement shows a focus of similar appearing chondroid stroma with adjacent microscopic focus of serous carcinoma.      09/01/2016 Surgery    Surgeon: Donaciano Eva   Operation: Robotic-assisted laparoscopic total hysterectomy with bilateral salpingoophorectomy, Sentinel lymph node biopsy  Operative Findings:  : 6cm normal appearing uterus, normal tubes and ovaries, no suspicious lymph nodes, no apparent peritoneal disease.       11/10/2016 Procedure    Successful placement  of a right IJ  approach Power Port with ultrasound and fluoroscopic guidance. The catheter is ready for use.      11/12/2016 -  Chemotherapy    She received carboplatin & Taxol      12/02/2016 Adverse Reaction    She had severe neuropathy from cycle 1 of treatment. Dose of chemo reduced from cycle 2 onwards       INTERVAL HISTORY: Please see below for problem oriented charting. She returns for cycle 3 of therapy She continues to complain of severe peripheral neuropathy requiring pain medicine on a regular basis Denies abdominal pain, constipation or nausea No recent infection  REVIEW OF SYSTEMS:   Constitutional: Denies fevers, chills or abnormal weight loss Eyes: Denies blurriness of vision Ears, nose, mouth, throat, and face: Denies mucositis or sore throat Respiratory: Denies cough, dyspnea or wheezes Cardiovascular: Denies palpitation, chest discomfort or lower extremity swelling Gastrointestinal:  Denies nausea, heartburn or change in bowel habits Skin: Denies abnormal skin rashes Lymphatics: Denies new lymphadenopathy or easy bruising Neurological:Denies numbness, tingling or new weaknesses Behavioral/Psych: Mood is stable, no new changes  All other systems were reviewed with the patient and are negative.  I have reviewed the past medical history, past surgical history, social history and family history with the patient and they are unchanged from previous note.  ALLERGIES:  is allergic to latex and tamiflu  [oseltamivir phosphate].  MEDICATIONS:  Current Outpatient Prescriptions  Medication Sig Dispense Refill  . Ascorbic Acid (VITAMIN C PO) Take 1 tablet by mouth 3 (three) times daily as needed (for immune health support or cold symptoms.).    Marland Kitchen dexamethasone (DECADRON) 4 MG tablet Take 5 tabs the night before scheduled chemo and another 5 tabs at 6 am the day of chemo, every 21 days 30 tablet 1  . ECHINACEA HERB PO Take by mouth.    . Flaxseed Oil OIL Take by mouth.    .  gabapentin (NEURONTIN) 300 MG capsule Take 1 capsule (300 mg total) by mouth 3 (three) times daily. 90 capsule 1  . lidocaine-prilocaine (EMLA) cream Apply to affected area once 30 g 3  . morphine (MSIR) 15 MG tablet Take 1 tablet (15 mg total) by mouth every 4 (four) hours as needed for severe pain. 90 tablet 0  . ondansetron (ZOFRAN) 8 MG tablet Take 1 tablet (8 mg total) by mouth 2 (two) times daily as needed for refractory nausea / vomiting. Start on day 3 after chemo. 30 tablet 1  . prochlorperazine (COMPAZINE) 10 MG tablet Take 1 tablet (10 mg total) by mouth every 6 (six) hours as needed (Nausea or vomiting). 30 tablet 1  . saccharomyces boulardii (FLORASTOR) 250 MG capsule Take 250 mg by mouth daily.     No current facility-administered medications for this visit.    Facility-Administered Medications Ordered in Other Visits  Medication Dose Route Frequency Provider Last Rate Last Dose  . CARBOplatin (PARAPLATIN) 650 mg in sodium chloride 0.9 % 250 mL chemo infusion  650 mg Intravenous Once Alvy Bimler, Kathryn Linarez, MD      . heparin lock flush 100 unit/mL  500 Units Intracatheter Once PRN Alvy Bimler, Kodey Xue, MD      . PACLitaxel (TAXOL) 216 mg in sodium chloride 0.9 % 250 mL chemo infusion (> 80mg /m2)  105 mg/m2 (Treatment Plan Recorded) Intravenous Once Luann Aspinwall, MD 95 mL/hr at 12/23/16 1230 216 mg at 12/23/16 1230  . sodium chloride flush (NS) 0.9 % injection 10 mL  10 mL Intracatheter PRN  Heath Lark, MD        PHYSICAL EXAMINATION: ECOG PERFORMANCE STATUS: 1 - Symptomatic but completely ambulatory  Vitals:   12/23/16 0915  BP: 138/74  Pulse: 78  Resp: 18  Temp: 98 F (36.7 C)  SpO2: 98%   Filed Weights   12/23/16 0915  Weight: 197 lb 9.6 oz (89.6 kg)    GENERAL:alert, no distress and comfortable SKIN: skin color, texture, turgor are normal, no rashes or significant lesions EYES: normal, Conjunctiva are pink and non-injected, sclera clear OROPHARYNX:no exudate, no erythema and lips,  buccal mucosa, and tongue normal  NECK: supple, thyroid normal size, non-tender, without nodularity LYMPH:  no palpable lymphadenopathy in the cervical, axillary or inguinal LUNGS: clear to auscultation and percussion with normal breathing effort HEART: regular rate & rhythm and no murmurs and no lower extremity edema ABDOMEN:abdomen soft, non-tender and normal bowel sounds Musculoskeletal:no cyanosis of digits and no clubbing  NEURO: alert & oriented x 3 with fluent speech, no focal motor/sensory deficits  LABORATORY DATA:  I have reviewed the data as listed    Component Value Date/Time   NA 138 12/23/2016 0843   K 4.0 12/23/2016 0843   CL 105 11/10/2016 1237   CO2 21 (L) 12/23/2016 0843   GLUCOSE 189 (H) 12/23/2016 0843   BUN 11.2 12/23/2016 0843   CREATININE 0.8 12/23/2016 0843   CALCIUM 9.9 12/23/2016 0843   PROT 7.4 12/23/2016 0843   ALBUMIN 3.9 12/23/2016 0843   AST 16 12/23/2016 0843   ALT 28 12/23/2016 0843   ALKPHOS 130 12/23/2016 0843   BILITOT 0.35 12/23/2016 0843   GFRNONAA >60 11/10/2016 1237   GFRAA >60 11/10/2016 1237    No results found for: SPEP, UPEP  Lab Results  Component Value Date   WBC 6.4 12/23/2016   NEUTROABS 5.6 12/23/2016   HGB 12.7 12/23/2016   HCT 38.3 12/23/2016   MCV 80.2 12/23/2016   PLT 195 12/23/2016      Chemistry      Component Value Date/Time   NA 138 12/23/2016 0843   K 4.0 12/23/2016 0843   CL 105 11/10/2016 1237   CO2 21 (L) 12/23/2016 0843   BUN 11.2 12/23/2016 0843   CREATININE 0.8 12/23/2016 0843      Component Value Date/Time   CALCIUM 9.9 12/23/2016 0843   ALKPHOS 130 12/23/2016 0843   AST 16 12/23/2016 0843   ALT 28 12/23/2016 0843   BILITOT 0.35 12/23/2016 0843      ASSESSMENT & PLAN:  Endometrial cancer ([place]) She continues to tolerate treatment poorly with significant neuropathy Plan to further reduce the dose of Taxol We will see her back in 3 weeks for further management  Peripheral neuropathy due  to chemotherapy ([place]) I recommend increasing the dose of gabapentin to 300 mg 3 times a day if tolerated I refill her prescription pain medicine Plan to reduce dose of Taxol  Drug-induced hyperglycemia She has drug induced hyperglycemia Observe only for now   No orders of the defined types were placed in this encounter.  All questions were answered. The patient knows to call the clinic with any problems, questions or concerns. No barriers to learning was detected. I spent 15 minutes counseling the patient face to face. The total time spent in the appointment was 20 minutes and more than 50% was on counseling and review of test results     Heath Lark, MD 12/23/2016 2:05 PM

## 2016-12-30 ENCOUNTER — Ambulatory Visit: Payer: 59

## 2017-01-13 ENCOUNTER — Other Ambulatory Visit: Payer: Self-pay | Admitting: Hematology and Oncology

## 2017-01-13 ENCOUNTER — Ambulatory Visit (HOSPITAL_BASED_OUTPATIENT_CLINIC_OR_DEPARTMENT_OTHER): Payer: 59

## 2017-01-13 ENCOUNTER — Encounter: Payer: Self-pay | Admitting: Hematology and Oncology

## 2017-01-13 ENCOUNTER — Ambulatory Visit (HOSPITAL_BASED_OUTPATIENT_CLINIC_OR_DEPARTMENT_OTHER): Payer: 59 | Admitting: Hematology and Oncology

## 2017-01-13 ENCOUNTER — Telehealth: Payer: Self-pay

## 2017-01-13 ENCOUNTER — Ambulatory Visit: Payer: 59

## 2017-01-13 ENCOUNTER — Other Ambulatory Visit (HOSPITAL_BASED_OUTPATIENT_CLINIC_OR_DEPARTMENT_OTHER): Payer: 59

## 2017-01-13 DIAGNOSIS — C7982 Secondary malignant neoplasm of genital organs: Secondary | ICD-10-CM

## 2017-01-13 DIAGNOSIS — C541 Malignant neoplasm of endometrium: Secondary | ICD-10-CM | POA: Diagnosis not present

## 2017-01-13 DIAGNOSIS — T50905A Adverse effect of unspecified drugs, medicaments and biological substances, initial encounter: Secondary | ICD-10-CM

## 2017-01-13 DIAGNOSIS — R739 Hyperglycemia, unspecified: Secondary | ICD-10-CM

## 2017-01-13 DIAGNOSIS — G620 Drug-induced polyneuropathy: Secondary | ICD-10-CM | POA: Diagnosis not present

## 2017-01-13 DIAGNOSIS — Z5111 Encounter for antineoplastic chemotherapy: Secondary | ICD-10-CM | POA: Diagnosis not present

## 2017-01-13 DIAGNOSIS — Z95828 Presence of other vascular implants and grafts: Secondary | ICD-10-CM

## 2017-01-13 DIAGNOSIS — T451X5A Adverse effect of antineoplastic and immunosuppressive drugs, initial encounter: Secondary | ICD-10-CM

## 2017-01-13 LAB — CBC WITH DIFFERENTIAL/PLATELET
BASO%: 0.1 % (ref 0.0–2.0)
Basophils Absolute: 0 10*3/uL (ref 0.0–0.1)
EOS%: 0 % (ref 0.0–7.0)
Eosinophils Absolute: 0 10*3/uL (ref 0.0–0.5)
HCT: 37 % (ref 34.8–46.6)
HGB: 12.2 g/dL (ref 11.6–15.9)
LYMPH#: 0.8 10*3/uL — AB (ref 0.9–3.3)
LYMPH%: 16.2 % (ref 14.0–49.7)
MCH: 26.9 pg (ref 25.1–34.0)
MCHC: 33 g/dL (ref 31.5–36.0)
MCV: 81.5 fL (ref 79.5–101.0)
MONO#: 0 10*3/uL — AB (ref 0.1–0.9)
MONO%: 0.8 % (ref 0.0–14.0)
NEUT#: 3.9 10*3/uL (ref 1.5–6.5)
NEUT%: 82.9 % — ABNORMAL HIGH (ref 38.4–76.8)
PLATELETS: 190 10*3/uL (ref 145–400)
RBC: 4.53 10*6/uL (ref 3.70–5.45)
RDW: 17.2 % — ABNORMAL HIGH (ref 11.2–14.5)
WBC: 4.7 10*3/uL (ref 3.9–10.3)

## 2017-01-13 LAB — COMPREHENSIVE METABOLIC PANEL
ALBUMIN: 3.9 g/dL (ref 3.5–5.0)
ALK PHOS: 134 U/L (ref 40–150)
ALT: 25 U/L (ref 0–55)
ANION GAP: 11 meq/L (ref 3–11)
AST: 17 U/L (ref 5–34)
BILIRUBIN TOTAL: 0.28 mg/dL (ref 0.20–1.20)
BUN: 10.5 mg/dL (ref 7.0–26.0)
CO2: 21 meq/L — AB (ref 22–29)
CREATININE: 0.8 mg/dL (ref 0.6–1.1)
Calcium: 9.7 mg/dL (ref 8.4–10.4)
Chloride: 107 mEq/L (ref 98–109)
EGFR: >60 mL/min/{1.73_m2} (ref >60)
GLUCOSE: 148 mg/dL — AB (ref 70–140)
Potassium: 3.9 mEq/L (ref 3.5–5.1)
Sodium: 139 mEq/L (ref 136–145)
TOTAL PROTEIN: 7.2 g/dL (ref 6.4–8.3)

## 2017-01-13 MED ORDER — PALONOSETRON HCL INJECTION 0.25 MG/5ML
0.2500 mg | Freq: Once | INTRAVENOUS | Status: AC
  Administered 2017-01-13: 0.25 mg via INTRAVENOUS

## 2017-01-13 MED ORDER — SODIUM CHLORIDE 0.9 % IV SOLN
Freq: Once | INTRAVENOUS | Status: AC
  Administered 2017-01-13: 11:00:00 via INTRAVENOUS
  Filled 2017-01-13: qty 5

## 2017-01-13 MED ORDER — DIPHENHYDRAMINE HCL 50 MG/ML IJ SOLN
INTRAMUSCULAR | Status: AC
  Filled 2017-01-13: qty 1

## 2017-01-13 MED ORDER — SODIUM CHLORIDE 0.9% FLUSH
10.0000 mL | INTRAVENOUS | Status: DC | PRN
  Administered 2017-01-13: 10 mL
  Filled 2017-01-13: qty 10

## 2017-01-13 MED ORDER — FAMOTIDINE IN NACL 20-0.9 MG/50ML-% IV SOLN
20.0000 mg | Freq: Once | INTRAVENOUS | Status: AC
  Administered 2017-01-13: 20 mg via INTRAVENOUS

## 2017-01-13 MED ORDER — SODIUM CHLORIDE 0.9 % IV SOLN
105.0000 mg/m2 | Freq: Once | INTRAVENOUS | Status: AC
  Administered 2017-01-13: 216 mg via INTRAVENOUS
  Filled 2017-01-13: qty 36

## 2017-01-13 MED ORDER — HEPARIN SOD (PORK) LOCK FLUSH 100 UNIT/ML IV SOLN
500.0000 [IU] | Freq: Once | INTRAVENOUS | Status: DC
  Filled 2017-01-13: qty 5

## 2017-01-13 MED ORDER — DIPHENHYDRAMINE HCL 50 MG/ML IJ SOLN
50.0000 mg | Freq: Once | INTRAMUSCULAR | Status: AC
  Administered 2017-01-13: 50 mg via INTRAVENOUS

## 2017-01-13 MED ORDER — SODIUM CHLORIDE 0.9 % IV SOLN
650.0000 mg | Freq: Once | INTRAVENOUS | Status: AC
  Administered 2017-01-13: 650 mg via INTRAVENOUS
  Filled 2017-01-13: qty 65

## 2017-01-13 MED ORDER — FAMOTIDINE IN NACL 20-0.9 MG/50ML-% IV SOLN
INTRAVENOUS | Status: AC
  Filled 2017-01-13: qty 50

## 2017-01-13 MED ORDER — HEPARIN SOD (PORK) LOCK FLUSH 100 UNIT/ML IV SOLN
500.0000 [IU] | Freq: Once | INTRAVENOUS | Status: AC | PRN
  Administered 2017-01-13: 500 [IU]
  Filled 2017-01-13: qty 5

## 2017-01-13 MED ORDER — SODIUM CHLORIDE 0.9 % IV SOLN
Freq: Once | INTRAVENOUS | Status: AC
  Administered 2017-01-13: 11:00:00 via INTRAVENOUS

## 2017-01-13 MED ORDER — PALONOSETRON HCL INJECTION 0.25 MG/5ML
INTRAVENOUS | Status: AC
  Filled 2017-01-13: qty 5

## 2017-01-13 MED ORDER — SODIUM CHLORIDE 0.9% FLUSH
10.0000 mL | Freq: Once | INTRAVENOUS | Status: AC
  Administered 2017-01-13: 10 mL
  Filled 2017-01-13: qty 10

## 2017-01-13 NOTE — Progress Notes (Signed)
[place] OFFICE PROGRESS NOTE  Patient Care Team: [place], [place] as PCP - General (Family Medicine)  SUMMARY OF ONCOLOGIC HISTORY:   Endometrial cancer ([place])   08/11/2016 Procedure    Procedure: hysteroscopy, polypectomy and curettage with MyoSure       08/13/2016 Pathology Results    Endometrium, curettage, and endometrial polyp - ENDOMETRIAL CARCINOMA, SEE COMMENT. 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 There is a small focus (5%) of clear cell carcinoma and a few foci suspicious for serous carcinoma.       08/27/2016 Imaging    Ct scan of abdomen 1. No findings of adenopathy or metastatic disease in the abdomen or pelvis. 2. 2 by 3 mm peripheral right lower lobe pulmonary nodule on image 11/7. Highly likely to be benign based on morphology      09/01/2016 Pathology Results    1. Lymph node, sentinel, biopsy, right external iliac - ONE BENIGN LYMPH NODE (0/1). 2. Lymph node, sentinel, biopsy, right obturator - ONE BENIGN LYMPH NODE (0/1). 3. Lymph node, sentinel, biopsy, distal left external iliac - ONE BENIGN LYMPH NODE (0/1). 4. Lymph node, sentinel, biopsy, left proximal external iliac - ONE BENIGN LYMPH NODE (0/1). 5. Uterus +/- tubes/ovaries, neoplastic, cervix - ENDOMETRIAL ADENOCARCINOMA. - LUMINAL INVOLVEMENT OF RIGHT AND LEFT FALLOPIAN TUBES. - CERVIX AND BILATERAL OVARIES FREE OF TUMOR. - SEE ONCOLOGY TABLE AND COMMENT. Microscopic Comment 1. - 4. Immunohistochemistry for cytokeratin AE1/AE3 is performed on all of the lymph nodes (parts 1-4) and no positivity is identified. 5. ONCOLOGY TABLE-UTERUS, CARCINOMA OR CARCINOSARCOMA  Specimen: Uterus with bilateral fallopian tubes and ovaries with right and left pelvic lymph nodes Procedure: Hysterectomy with bilateral salpingo-oophorectomy Lymph node sampling performed: Yes Specimen integrity:  Intact Maximum tumor size: 1.8 cm Histologic type: Endometrial adenocarcinoma, see comment. Grade: II Myometrial invasion: 0 cm where myometrium is 1.1 cm in thickness Cervical stromal involvement: No Extent of involvement of other organs: Luminal involvement of right and left fallopian tubes, see comment. Lymph - vascular invasion: Not identified  Peritoneal washings: N/A Lymph nodes: Examined: 4 Sentinel 0 Non-sentinel 4 Total Lymph nodes with metastasis: 0 Isolated tumor cells (< 0.2 mm): 0 Micrometastasis: (> 0.2 mm and < 2.0 mm): 0 Macrometastasis: (> 2.0 mm): 0 Extracapsular extension: N/A Pelvic lymph nodes: 0 involved of 4 lymph nodes. Para-aortic lymph nodes: 0 involved of 0 lymph nodes. Other (specify involvement and site): N/A TNM code: pT3a, pN0 FIGO Stage (based on pathologic findings, needs clinical correlation): III-A  Comment: The endometrial tumor is a moderately differentiated mostly endometrioid adenocarcinoma with rare foci of squamous differentiation and a rare microscopic focus consistent with serous differentiation. There is also a microscopic focus with chondroid stroma which is associated with an adjacent microscopic serous component. This finding suggests a microscopic stromal component which is less than 1 mm in greatest dimension. In addition, there are luminal foci of carcinoma within the right and fallopian tubes and the right fallopian tube lumen involvement shows a focus of similar appearing chondroid stroma with adjacent microscopic focus of serous carcinoma.      09/01/2016 Surgery    Surgeon: Donaciano Eva   Operation: Robotic-assisted laparoscopic total hysterectomy with bilateral salpingoophorectomy, Sentinel lymph node biopsy  Operative Findings:  : 6cm normal appearing uterus, normal tubes and ovaries, no suspicious lymph nodes, no apparent peritoneal disease.       11/10/2016 Procedure    Successful placement  of a right IJ  approach Power Port with ultrasound and fluoroscopic guidance. The catheter is ready for use.      11/12/2016 -  Chemotherapy    She received carboplatin & Taxol      12/02/2016 Adverse Reaction    She had severe neuropathy from cycle 1 of treatment. Dose of chemo reduced from cycle 2 onwards       INTERVAL HISTORY: Please see below for problem oriented charting. She returns for further follow-up She tolerated last cycle treatment well She denies progression of neuropathy Her bone pain/neuropathy pain is manageable and she did not take more pain medicine than needed No nausea, vomiting or mucositis No recent infection  REVIEW OF SYSTEMS:   Constitutional: Denies fevers, chills or abnormal weight loss Eyes: Denies blurriness of vision Ears, nose, mouth, throat, and face: Denies mucositis or sore throat Respiratory: Denies cough, dyspnea or wheezes Cardiovascular: Denies palpitation, chest discomfort or lower extremity swelling Gastrointestinal:  Denies nausea, heartburn or change in bowel habits Skin: Denies abnormal skin rashes Lymphatics: Denies new lymphadenopathy or easy bruising Neurological:Denies numbness, tingling or new weaknesses Behavioral/Psych: Mood is stable, no new changes  All other systems were reviewed with the patient and are negative.  I have reviewed the past medical history, past surgical history, social history and family history with the patient and they are unchanged from previous note.  ALLERGIES:  is allergic to latex and tamiflu  [oseltamivir phosphate].  MEDICATIONS:  Current Outpatient Prescriptions  Medication Sig Dispense Refill  . Ascorbic Acid (VITAMIN C PO) Take 1 tablet by mouth 3 (three) times daily as needed (for immune health support or cold symptoms.).    Marland Kitchen dexamethasone (DECADRON) 4 MG tablet Take 5 tabs the night before scheduled chemo and another 5 tabs at 6 am the day of chemo, every 21 days 30 tablet 1  . ECHINACEA HERB PO Take by  mouth.    . Flaxseed Oil OIL Take by mouth.    . gabapentin (NEURONTIN) 300 MG capsule Take 1 capsule (300 mg total) by mouth 3 (three) times daily. 90 capsule 1  . lidocaine-prilocaine (EMLA) cream Apply to affected area once 30 g 3  . morphine (MSIR) 15 MG tablet Take 1 tablet (15 mg total) by mouth every 4 (four) hours as needed for severe pain. 90 tablet 0  . ondansetron (ZOFRAN) 8 MG tablet Take 1 tablet (8 mg total) by mouth 2 (two) times daily as needed for refractory nausea / vomiting. Start on day 3 after chemo. 30 tablet 1  . prochlorperazine (COMPAZINE) 10 MG tablet Take 1 tablet (10 mg total) by mouth every 6 (six) hours as needed (Nausea or vomiting). 30 tablet 1  . saccharomyces boulardii (FLORASTOR) 250 MG capsule Take 250 mg by mouth daily.     No current facility-administered medications for this visit.    Facility-Administered Medications Ordered in Other Visits  Medication Dose Route Frequency Provider Last Rate Last Dose  . 0.9 %  sodium chloride infusion   Intravenous Once Alvy Bimler, Octaviano Mukai, MD      . CARBOplatin (PARAPLATIN) 650 mg in sodium chloride 0.9 % 250 mL chemo infusion  650 mg Intravenous Once Alvy Bimler, Keylon Labelle, MD      . diphenhydrAMINE (BENADRYL) injection 50 mg  50 mg Intravenous Once Alvy Bimler, Jadee Golebiewski, MD      . famotidine (PEPCID) IVPB 20 mg premix  20 mg Intravenous Once Chavis Tessler, MD      . fosaprepitant (EMEND) 150 mg, dexamethasone (  DECADRON) 12 mg in sodium chloride 0.9 % 145 mL IVPB   Intravenous Once Alvy Bimler, Maleena Eddleman, MD      . heparin lock flush 100 unit/mL  500 Units Intracatheter Once PRN Alvy Bimler, Jourdon Zimmerle, MD      . PACLitaxel (TAXOL) 216 mg in sodium chloride 0.9 % 250 mL chemo infusion (> 80mg /m2)  105 mg/m2 (Treatment Plan Recorded) Intravenous Once Alvy Bimler, Qais Jowers, MD      . palonosetron (ALOXI) injection 0.25 mg  0.25 mg Intravenous Once Saliha Salts, MD      . sodium chloride flush (NS) 0.9 % injection 10 mL  10 mL Intracatheter PRN Alvy Bimler, Latissa Frick, MD        PHYSICAL  EXAMINATION: ECOG PERFORMANCE STATUS: 1 - Symptomatic but completely ambulatory  Vitals:   01/13/17 1005  BP: 118/83  Pulse: 72  Resp: 19  Temp: 98 F (36.7 C)  SpO2: 100%   Filed Weights   01/13/17 1005  Weight: 196 lb 11.2 oz (89.2 kg)    GENERAL:alert, no distress and comfortable SKIN: skin color, texture, turgor are normal, no rashes or significant lesions EYES: normal, Conjunctiva are pink and non-injected, sclera clear OROPHARYNX:no exudate, no erythema and lips, buccal mucosa, and tongue normal  NECK: supple, thyroid normal size, non-tender, without nodularity LYMPH:  no palpable lymphadenopathy in the cervical, axillary or inguinal LUNGS: clear to auscultation and percussion with normal breathing effort HEART: regular rate & rhythm and no murmurs and no lower extremity edema ABDOMEN:abdomen soft, non-tender and normal bowel sounds Musculoskeletal:no cyanosis of digits and no clubbing  NEURO: alert & oriented x 3 with fluent speech, no focal motor/sensory deficits  LABORATORY DATA:  I have reviewed the data as listed    Component Value Date/Time   NA 139 01/13/2017 0841   K 3.9 01/13/2017 0841   CL 105 11/10/2016 1237   CO2 21 (L) 01/13/2017 0841   GLUCOSE 148 (H) 01/13/2017 0841   BUN 10.5 01/13/2017 0841   CREATININE 0.8 01/13/2017 0841   CALCIUM 9.7 01/13/2017 0841   PROT 7.2 01/13/2017 0841   ALBUMIN 3.9 01/13/2017 0841   AST 17 01/13/2017 0841   ALT 25 01/13/2017 0841   ALKPHOS 134 01/13/2017 0841   BILITOT 0.28 01/13/2017 0841   GFRNONAA >60 11/10/2016 1237   GFRAA >60 11/10/2016 1237    No results found for: SPEP, UPEP  Lab Results  Component Value Date   WBC 4.7 01/13/2017   NEUTROABS 3.9 01/13/2017   HGB 12.2 01/13/2017   HCT 37.0 01/13/2017   MCV 81.5 01/13/2017   PLT 190 01/13/2017      Chemistry      Component Value Date/Time   NA 139 01/13/2017 0841   K 3.9 01/13/2017 0841   CL 105 11/10/2016 1237   CO2 21 (L) 01/13/2017 0841    BUN 10.5 01/13/2017 0841   CREATININE 0.8 01/13/2017 0841      Component Value Date/Time   CALCIUM 9.7 01/13/2017 0841   ALKPHOS 134 01/13/2017 0841   AST 17 01/13/2017 0841   ALT 25 01/13/2017 0841   BILITOT 0.28 01/13/2017 0841      ASSESSMENT & PLAN:  Endometrial cancer ([place]) She tolerated last cycle treatment well with dose adjustment I will continue the same treatment without modification Continue supportive care  Peripheral neuropathy due to chemotherapy [facility]) She will continue gabapentin and pain medicine as needed I plan to continue at reduced dose of Taxol  Drug-induced hyperglycemia She had mild hyperglycemia due to steroid  treatment Will monitor closely   No orders of the defined types were placed in this encounter.  All questions were answered. The patient knows to call the clinic with any problems, questions or concerns. No barriers to learning was detected. I spent 15 minutes counseling the patient face to face. The total time spent in the appointment was 20 minutes and more than 50% was on counseling and review of test results     Heath Lark, MD 01/13/2017 10:58 AM

## 2017-01-13 NOTE — Telephone Encounter (Signed)
Printed abs and calender for upcoming appointment in December. Per 10/31 los

## 2017-01-13 NOTE — Assessment & Plan Note (Signed)
She will continue gabapentin and pain medicine as needed I plan to continue at reduced dose of Taxol

## 2017-01-13 NOTE — Assessment & Plan Note (Signed)
She tolerated last cycle treatment well with dose adjustment I will continue the same treatment without modification Continue supportive care

## 2017-01-13 NOTE — Patient Instructions (Signed)
[facility] Discharge Instructions for Patients Receiving Chemotherapy  Today you received the following chemotherapy agents Taxol and Carboplatin   To help prevent nausea and vomiting after your treatment, we encourage you to take your nausea medication as directed. No Zofran for 3 days. Take Compazine instead.    If you develop nausea and vomiting that is not controlled by your nausea medication, call the clinic.   BELOW ARE SYMPTOMS THAT SHOULD BE REPORTED IMMEDIATELY:  *FEVER GREATER THAN 100.5 F  *CHILLS WITH OR WITHOUT FEVER  NAUSEA AND VOMITING THAT IS NOT CONTROLLED WITH YOUR NAUSEA MEDICATION  *UNUSUAL SHORTNESS OF BREATH  *UNUSUAL BRUISING OR BLEEDING  TENDERNESS IN MOUTH AND THROAT WITH OR WITHOUT PRESENCE OF ULCERS  *URINARY PROBLEMS  *BOWEL PROBLEMS  UNUSUAL RASH Items with * indicate a potential emergency and should be followed up as soon as possible.  Feel free to call the clinic should you have any questions or concerns. The clinic phone number is [phone].  Please show the CHEMO ALERT CARD at check-in to the Emergency Department and triage nurse.   

## 2017-01-13 NOTE — Assessment & Plan Note (Signed)
She had mild hyperglycemia due to steroid treatment Will monitor closely

## 2017-02-02 ENCOUNTER — Ambulatory Visit (HOSPITAL_BASED_OUTPATIENT_CLINIC_OR_DEPARTMENT_OTHER): Payer: 59 | Admitting: Hematology and Oncology

## 2017-02-02 ENCOUNTER — Ambulatory Visit (HOSPITAL_BASED_OUTPATIENT_CLINIC_OR_DEPARTMENT_OTHER): Payer: 59

## 2017-02-02 ENCOUNTER — Other Ambulatory Visit (HOSPITAL_BASED_OUTPATIENT_CLINIC_OR_DEPARTMENT_OTHER): Payer: 59

## 2017-02-02 ENCOUNTER — Ambulatory Visit: Payer: 59

## 2017-02-02 ENCOUNTER — Telehealth: Payer: Self-pay | Admitting: *Deleted

## 2017-02-02 ENCOUNTER — Encounter: Payer: Self-pay | Admitting: Hematology and Oncology

## 2017-02-02 VITALS — BP 134/76 | HR 81 | Temp 97.9°F | Resp 18 | Ht 66.0 in | Wt 197.4 lb

## 2017-02-02 DIAGNOSIS — C541 Malignant neoplasm of endometrium: Secondary | ICD-10-CM

## 2017-02-02 DIAGNOSIS — G620 Drug-induced polyneuropathy: Secondary | ICD-10-CM

## 2017-02-02 DIAGNOSIS — Z95828 Presence of other vascular implants and grafts: Secondary | ICD-10-CM

## 2017-02-02 DIAGNOSIS — R739 Hyperglycemia, unspecified: Secondary | ICD-10-CM

## 2017-02-02 DIAGNOSIS — Z5111 Encounter for antineoplastic chemotherapy: Secondary | ICD-10-CM | POA: Diagnosis not present

## 2017-02-02 DIAGNOSIS — C7982 Secondary malignant neoplasm of genital organs: Secondary | ICD-10-CM

## 2017-02-02 DIAGNOSIS — T50905A Adverse effect of unspecified drugs, medicaments and biological substances, initial encounter: Secondary | ICD-10-CM

## 2017-02-02 DIAGNOSIS — T451X5A Adverse effect of antineoplastic and immunosuppressive drugs, initial encounter: Secondary | ICD-10-CM

## 2017-02-02 DIAGNOSIS — R300 Dysuria: Secondary | ICD-10-CM

## 2017-02-02 LAB — CBC WITH DIFFERENTIAL/PLATELET
BASO%: 0.1 % (ref 0.0–2.0)
BASOS ABS: 0 10*3/uL (ref 0.0–0.1)
EOS ABS: 0 10*3/uL (ref 0.0–0.5)
EOS%: 0 % (ref 0.0–7.0)
HEMATOCRIT: 35.8 % (ref 34.8–46.6)
HGB: 11.9 g/dL (ref 11.6–15.9)
LYMPH%: 10 % — ABNORMAL LOW (ref 14.0–49.7)
MCH: 27.9 pg (ref 25.1–34.0)
MCHC: 33.3 g/dL (ref 31.5–36.0)
MCV: 83.6 fL (ref 79.5–101.0)
MONO#: 0.1 10*3/uL (ref 0.1–0.9)
MONO%: 0.8 % (ref 0.0–14.0)
NEUT#: 5.9 10*3/uL (ref 1.5–6.5)
NEUT%: 89.1 % — AB (ref 38.4–76.8)
Platelets: 152 10*3/uL (ref 145–400)
RBC: 4.28 10*6/uL (ref 3.70–5.45)
RDW: 19 % — ABNORMAL HIGH (ref 11.2–14.5)
WBC: 6.6 10*3/uL (ref 3.9–10.3)
lymph#: 0.7 10*3/uL — ABNORMAL LOW (ref 0.9–3.3)

## 2017-02-02 LAB — COMPREHENSIVE METABOLIC PANEL
ALT: 28 U/L (ref 0–55)
AST: 15 U/L (ref 5–34)
Albumin: 3.9 g/dL (ref 3.5–5.0)
Alkaline Phosphatase: 135 U/L (ref 40–150)
Anion Gap: 14 mEq/L — ABNORMAL HIGH (ref 3–11)
BILIRUBIN TOTAL: 0.33 mg/dL (ref 0.20–1.20)
BUN: 11.6 mg/dL (ref 7.0–26.0)
CHLORIDE: 106 meq/L (ref 98–109)
CO2: 18 meq/L — AB (ref 22–29)
CREATININE: 0.8 mg/dL (ref 0.6–1.1)
Calcium: 9.7 mg/dL (ref 8.4–10.4)
EGFR: >60 mL/min/{1.73_m2} (ref >60)
Glucose: 215 mg/dl — ABNORMAL HIGH (ref 70–140)
Potassium: 3.9 mEq/L (ref 3.5–5.1)
SODIUM: 138 meq/L (ref 136–145)
TOTAL PROTEIN: 7.2 g/dL (ref 6.4–8.3)

## 2017-02-02 LAB — URINALYSIS, MICROSCOPIC - CHCC
Bilirubin (Urine): NEGATIVE
Blood: NEGATIVE
GLUCOSE UR CHCC: NEGATIVE mg/dL
Ketones: NEGATIVE mg/dL
Leukocyte Esterase: NEGATIVE
Nitrite: NEGATIVE
Protein: NEGATIVE mg/dL
SPECIFIC GRAVITY, URINE: 1.01 (ref 1.003–1.035)
UROBILINOGEN UR: 0.2 mg/dL (ref 0.2–1)
pH: 6 (ref 4.6–8.0)

## 2017-02-02 MED ORDER — DIPHENHYDRAMINE HCL 50 MG/ML IJ SOLN
50.0000 mg | Freq: Once | INTRAMUSCULAR | Status: AC
Start: 2017-02-02 — End: 2017-02-02
  Administered 2017-02-02: 50 mg via INTRAVENOUS

## 2017-02-02 MED ORDER — PALONOSETRON HCL INJECTION 0.25 MG/5ML
0.2500 mg | Freq: Once | INTRAVENOUS | Status: AC
  Administered 2017-02-02: 0.25 mg via INTRAVENOUS

## 2017-02-02 MED ORDER — FAMOTIDINE IN NACL 20-0.9 MG/50ML-% IV SOLN
INTRAVENOUS | Status: AC
  Filled 2017-02-02: qty 50

## 2017-02-02 MED ORDER — PALONOSETRON HCL INJECTION 0.25 MG/5ML
INTRAVENOUS | Status: AC
  Filled 2017-02-02: qty 5

## 2017-02-02 MED ORDER — SODIUM CHLORIDE 0.9% FLUSH
10.0000 mL | Freq: Once | INTRAVENOUS | Status: AC
  Administered 2017-02-02: 10 mL
  Filled 2017-02-02: qty 10

## 2017-02-02 MED ORDER — HEPARIN SOD (PORK) LOCK FLUSH 100 UNIT/ML IV SOLN
500.0000 [IU] | Freq: Once | INTRAVENOUS | Status: AC | PRN
  Administered 2017-02-02: 500 [IU]
  Filled 2017-02-02: qty 5

## 2017-02-02 MED ORDER — FAMOTIDINE IN NACL 20-0.9 MG/50ML-% IV SOLN
20.0000 mg | Freq: Once | INTRAVENOUS | Status: AC
  Administered 2017-02-02: 20 mg via INTRAVENOUS

## 2017-02-02 MED ORDER — SODIUM CHLORIDE 0.9 % IV SOLN
Freq: Once | INTRAVENOUS | Status: AC
  Administered 2017-02-02: 11:00:00 via INTRAVENOUS
  Filled 2017-02-02: qty 5

## 2017-02-02 MED ORDER — SODIUM CHLORIDE 0.9 % IV SOLN
Freq: Once | INTRAVENOUS | Status: AC
  Administered 2017-02-02: 10:00:00 via INTRAVENOUS

## 2017-02-02 MED ORDER — SODIUM CHLORIDE 0.9 % IV SOLN
105.0000 mg/m2 | Freq: Once | INTRAVENOUS | Status: AC
  Administered 2017-02-02: 216 mg via INTRAVENOUS
  Filled 2017-02-02: qty 36

## 2017-02-02 MED ORDER — MORPHINE SULFATE 15 MG PO TABS: 15.0000 mg | ORAL_TABLET | ORAL | 0 refills | Status: DC | PRN

## 2017-02-02 MED ORDER — DIPHENHYDRAMINE HCL 50 MG/ML IJ SOLN
INTRAMUSCULAR | Status: AC
  Filled 2017-02-02: qty 1

## 2017-02-02 MED ORDER — SODIUM CHLORIDE 0.9 % IV SOLN
650.0000 mg | Freq: Once | INTRAVENOUS | Status: AC
  Administered 2017-02-02: 650 mg via INTRAVENOUS
  Filled 2017-02-02: qty 65

## 2017-02-02 MED ORDER — SODIUM CHLORIDE 0.9% FLUSH
10.0000 mL | INTRAVENOUS | Status: DC | PRN
  Administered 2017-02-02: 10 mL
  Filled 2017-02-02: qty 10

## 2017-02-02 MED FILL — MORPHINE SULFATE IR 15 MG T: 15 | 12 days supply | Qty: 74 | Fill #0

## 2017-02-02 NOTE — Assessment & Plan Note (Signed)
She will continue pain medicine as needed I plan to continue at reduced dose of Taxol She felt that gabapentin caused dizziness and like to avoid it I recommend B complex supplement

## 2017-02-02 NOTE — Patient Instructions (Signed)
   [facility] Discharge Instructions for Patients Receiving Chemotherapy  Today you received the following chemotherapy agents Taxol and Carboplatin   To help prevent nausea and vomiting after your treatment, we encourage you to take your nausea medication as directed.    If you develop nausea and vomiting that is not controlled by your nausea medication, call the clinic.   BELOW ARE SYMPTOMS THAT SHOULD BE REPORTED IMMEDIATELY:  *FEVER GREATER THAN 100.5 F  *CHILLS WITH OR WITHOUT FEVER  NAUSEA AND VOMITING THAT IS NOT CONTROLLED WITH YOUR NAUSEA MEDICATION  *UNUSUAL SHORTNESS OF BREATH  *UNUSUAL BRUISING OR BLEEDING  TENDERNESS IN MOUTH AND THROAT WITH OR WITHOUT PRESENCE OF ULCERS  *URINARY PROBLEMS  *BOWEL PROBLEMS  UNUSUAL RASH Items with * indicate a potential emergency and should be followed up as soon as possible.  Feel free to call the clinic should you have any questions or concerns. The clinic phone number is [phone].  Please show the CHEMO ALERT CARD at check-in to the Emergency Department and triage nurse.   

## 2017-02-02 NOTE — Progress Notes (Signed)
[place] OFFICE PROGRESS NOTE  Patient Care Team: [place], [place] as PCP - General (Family Medicine)  SUMMARY OF ONCOLOGIC HISTORY:   Endometrial cancer ([place])   08/11/2016 Procedure    Procedure: hysteroscopy, polypectomy and curettage with MyoSure       08/13/2016 Pathology Results    Endometrium, curettage, and endometrial polyp - ENDOMETRIAL CARCINOMA, SEE COMMENT. 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 There is a small focus (5%) of clear cell carcinoma and a few foci suspicious for serous carcinoma.       08/27/2016 Imaging    Ct scan of abdomen 1. No findings of adenopathy or metastatic disease in the abdomen or pelvis. 2. 2 by 3 mm peripheral right lower lobe pulmonary nodule on image 11/7. Highly likely to be benign based on morphology      09/01/2016 Pathology Results    1. Lymph node, sentinel, biopsy, right external iliac - ONE BENIGN LYMPH NODE (0/1). 2. Lymph node, sentinel, biopsy, right obturator - ONE BENIGN LYMPH NODE (0/1). 3. Lymph node, sentinel, biopsy, distal left external iliac - ONE BENIGN LYMPH NODE (0/1). 4. Lymph node, sentinel, biopsy, left proximal external iliac - ONE BENIGN LYMPH NODE (0/1). 5. Uterus +/- tubes/ovaries, neoplastic, cervix - ENDOMETRIAL ADENOCARCINOMA. - LUMINAL INVOLVEMENT OF RIGHT AND LEFT FALLOPIAN TUBES. - CERVIX AND BILATERAL OVARIES FREE OF TUMOR. - SEE ONCOLOGY TABLE AND COMMENT. Microscopic Comment 1. - 4. Immunohistochemistry for cytokeratin AE1/AE3 is performed on all of the lymph nodes (parts 1-4) and no positivity is identified. 5. ONCOLOGY TABLE-UTERUS, CARCINOMA OR CARCINOSARCOMA  Specimen: Uterus with bilateral fallopian tubes and ovaries with right and left pelvic lymph nodes Procedure: Hysterectomy with bilateral salpingo-oophorectomy Lymph node sampling performed: Yes Specimen integrity:  Intact Maximum tumor size: 1.8 cm Histologic type: Endometrial adenocarcinoma, see comment. Grade: II Myometrial invasion: 0 cm where myometrium is 1.1 cm in thickness Cervical stromal involvement: No Extent of involvement of other organs: Luminal involvement of right and left fallopian tubes, see comment. Lymph - vascular invasion: Not identified  Peritoneal washings: N/A Lymph nodes: Examined: 4 Sentinel 0 Non-sentinel 4 Total Lymph nodes with metastasis: 0 Isolated tumor cells (< 0.2 mm): 0 Micrometastasis: (> 0.2 mm and < 2.0 mm): 0 Macrometastasis: (> 2.0 mm): 0 Extracapsular extension: N/A Pelvic lymph nodes: 0 involved of 4 lymph nodes. Para-aortic lymph nodes: 0 involved of 0 lymph nodes. Other (specify involvement and site): N/A TNM code: pT3a, pN0 FIGO Stage (based on pathologic findings, needs clinical correlation): III-A  Comment: The endometrial tumor is a moderately differentiated mostly endometrioid adenocarcinoma with rare foci of squamous differentiation and a rare microscopic focus consistent with serous differentiation. There is also a microscopic focus with chondroid stroma which is associated with an adjacent microscopic serous component. This finding suggests a microscopic stromal component which is less than 1 mm in greatest dimension. In addition, there are luminal foci of carcinoma within the right and fallopian tubes and the right fallopian tube lumen involvement shows a focus of similar appearing chondroid stroma with adjacent microscopic focus of serous carcinoma.      09/01/2016 Surgery    Surgeon: Donaciano Eva   Operation: Robotic-assisted laparoscopic total hysterectomy with bilateral salpingoophorectomy, Sentinel lymph node biopsy  Operative Findings:  : 6cm normal appearing uterus, normal tubes and ovaries, no suspicious lymph nodes, no apparent peritoneal disease.       11/10/2016 Procedure    Successful placement  of a right IJ  approach Power Port with ultrasound and fluoroscopic guidance. The catheter is ready for use.      11/12/2016 -  Chemotherapy    She received carboplatin & Taxol      12/02/2016 Adverse Reaction    She had severe neuropathy from cycle 1 of treatment. Dose of chemo reduced from cycle 2 onwards       INTERVAL HISTORY: Please see below for problem oriented charting. She is seen prior to cycle 5 of chemotherapy She complains of persistent peripheral neuropathy but not worse She continued to take intermittent morphine sulfate as needed She denies the need to take gabapentin and does not like it because it causes dizziness She complained of mild dysuria No recent fever or chills No recent nausea or vomiting  REVIEW OF SYSTEMS:   Constitutional: Denies fevers, chills or abnormal weight loss Eyes: Denies blurriness of vision Ears, nose, mouth, throat, and face: Denies mucositis or sore throat Respiratory: Denies cough, dyspnea or wheezes Cardiovascular: Denies palpitation, chest discomfort or lower extremity swelling Gastrointestinal:  Denies nausea, heartburn or change in bowel habits Skin: Denies abnormal skin rashes Lymphatics: Denies new lymphadenopathy or easy bruising Neurological:Denies numbness, tingling or new weaknesses Behavioral/Psych: Mood is stable, no new changes  All other systems were reviewed with the patient and are negative.  I have reviewed the past medical history, past surgical history, social history and family history with the patient and they are unchanged from previous note.  ALLERGIES:  is allergic to latex and tamiflu  [oseltamivir phosphate].  MEDICATIONS:  Current Outpatient Medications  Medication Sig Dispense Refill  . Ascorbic Acid (VITAMIN C PO) Take 1 tablet by mouth 3 (three) times daily as needed (for immune health support or cold symptoms.).    Marland Kitchen dexamethasone (DECADRON) 4 MG tablet TAKE 5 TABLETS BY MOUTH THE NIGHT BEFORE SCHEDULED CHEMO AND  ANOTHER 5 TABLETS AT 6 AM THE DAY OF CHEMO, EVERY 21 DAYS 30 tablet 1  . ECHINACEA HERB PO Take by mouth.    . Flaxseed Oil OIL Take by mouth.    . lidocaine-prilocaine (EMLA) cream Apply to affected area once 30 g 3  . morphine (MSIR) 15 MG tablet Take 1 tablet (15 mg total) every 4 (four) hours as needed by mouth for severe pain. 90 tablet 0  . ondansetron (ZOFRAN) 8 MG tablet Take 1 tablet (8 mg total) by mouth 2 (two) times daily as needed for refractory nausea / vomiting. Start on day 3 after chemo. 30 tablet 1  . prochlorperazine (COMPAZINE) 10 MG tablet Take 1 tablet (10 mg total) by mouth every 6 (six) hours as needed (Nausea or vomiting). 30 tablet 1  . saccharomyces boulardii (FLORASTOR) 250 MG capsule Take 250 mg by mouth daily.     No current facility-administered medications for this visit.    Facility-Administered Medications Ordered in Other Visits  Medication Dose Route Frequency Provider Last Rate Last Dose  . sodium chloride flush (NS) 0.9 % injection 10 mL  10 mL Intracatheter PRN Alvy Bimler, Krishna Heuer, MD   10 mL at 02/02/17 1610    PHYSICAL EXAMINATION: ECOG PERFORMANCE STATUS: 1 - Symptomatic but completely ambulatory  Vitals:   02/02/17 0912  BP: 134/76  Pulse: 81  Resp: 18  Temp: 97.9 F (36.6 C)  SpO2: 99%   Filed Weights   02/02/17 0912  Weight: 197 lb 6.4 oz (89.5 kg)    GENERAL:alert, no distress and comfortable SKIN: skin color, texture, turgor are  normal, no rashes or significant lesions EYES: normal, Conjunctiva are pink and non-injected, sclera clear OROPHARYNX:no exudate, no erythema and lips, buccal mucosa, and tongue normal  NECK: supple, thyroid normal size, non-tender, without nodularity LYMPH:  no palpable lymphadenopathy in the cervical, axillary or inguinal LUNGS: clear to auscultation and percussion with normal breathing effort HEART: regular rate & rhythm and no murmurs and no lower extremity edema ABDOMEN:abdomen soft, non-tender and normal  bowel sounds Musculoskeletal:no cyanosis of digits and no clubbing  NEURO: alert & oriented x 3 with fluent speech, no focal motor/sensory deficits  LABORATORY DATA:  I have reviewed the data as listed    Component Value Date/Time   NA 138 02/02/2017 0842   K 3.9 02/02/2017 0842   CL 105 11/10/2016 1237   CO2 18 (L) 02/02/2017 0842   GLUCOSE 215 (H) 02/02/2017 0842   BUN 11.6 02/02/2017 0842   CREATININE 0.8 02/02/2017 0842   CALCIUM 9.7 02/02/2017 0842   PROT 7.2 02/02/2017 0842   ALBUMIN 3.9 02/02/2017 0842   AST 15 02/02/2017 0842   ALT 28 02/02/2017 0842   ALKPHOS 135 02/02/2017 0842   BILITOT 0.33 02/02/2017 0842   GFRNONAA >60 11/10/2016 1237   GFRAA >60 11/10/2016 1237    No results found for: SPEP, UPEP  Lab Results  Component Value Date   WBC 6.6 02/02/2017   NEUTROABS 5.9 02/02/2017   HGB 11.9 02/02/2017   HCT 35.8 02/02/2017   MCV 83.6 02/02/2017   PLT 152 02/02/2017      Chemistry      Component Value Date/Time   NA 138 02/02/2017 0842   K 3.9 02/02/2017 0842   CL 105 11/10/2016 1237   CO2 18 (L) 02/02/2017 0842   BUN 11.6 02/02/2017 0842   CREATININE 0.8 02/02/2017 0842      Component Value Date/Time   CALCIUM 9.7 02/02/2017 0842   ALKPHOS 135 02/02/2017 0842   AST 15 02/02/2017 0842   ALT 28 02/02/2017 0842   BILITOT 0.33 02/02/2017 0842      ASSESSMENT & PLAN:  Endometrial cancer ([place]) She tolerated last cycle treatment well with dose adjustment I will continue the same treatment without modification Continue supportive care  Peripheral neuropathy due to chemotherapy [facility]) She will continue pain medicine as needed I plan to continue at reduced dose of Taxol She felt that gabapentin caused dizziness and like to avoid it I recommend B complex supplement  Drug-induced hyperglycemia She had mild hyperglycemia due to steroid treatment Will monitor closely  Dysuria She has symptom of dysuria I recommend urinalysis and urine  culture   Orders Placed This Encounter  Procedures  . Urine Culture    Standing Status:   Future    Number of Occurrences:   1    Standing Expiration Date:   03/09/2018  . Urinalysis, Microscopic - [facility]    Standing Status:   Future    Number of Occurrences:   1    Standing Expiration Date:   03/09/2018   All questions were answered. The patient knows to call the clinic with any problems, questions or concerns. No barriers to learning was detected. I spent 15 minutes counseling the patient face to face. The total time spent in the appointment was 20 minutes and more than 50% was on counseling and review of test results     Heath Lark, MD 02/02/2017 5:19 PM

## 2017-02-02 NOTE — Assessment & Plan Note (Signed)
She tolerated last cycle treatment well with dose adjustment I will continue the same treatment without modification Continue supportive care

## 2017-02-02 NOTE — Telephone Encounter (Signed)
Notified of message below

## 2017-02-02 NOTE — Assessment & Plan Note (Signed)
She had mild hyperglycemia due to steroid treatment Will monitor closely

## 2017-02-02 NOTE — Telephone Encounter (Signed)
-----   Message from Heath Lark, MD sent at 02/02/2017 11:11 AM EST ----- Regarding: UA So far UA neg; let her know it is unlikely a UTI Please follow culture ----- Message ----- From: Interface, [facility] Sent: 02/02/2017   9:16 AM To: Heath Lark, MD

## 2017-02-02 NOTE — Assessment & Plan Note (Signed)
She has symptom of dysuria I recommend urinalysis and urine culture

## 2017-02-03 LAB — URINE CULTURE

## 2017-02-08 ENCOUNTER — Telehealth: Payer: Self-pay

## 2017-02-08 NOTE — Telephone Encounter (Signed)
-----   Message from Heath Lark, MD sent at 02/08/2017  9:50 AM EST ----- Regarding: urine culture is negative   ----- Message ----- From: Interface, Lab In Three Zero One Sent: 02/02/2017   9:16 AM To: Heath Lark, MD

## 2017-02-08 NOTE — Telephone Encounter (Signed)
Called and left below message. 

## 2017-02-22 ENCOUNTER — Other Ambulatory Visit: Payer: 59

## 2017-02-22 ENCOUNTER — Ambulatory Visit: Payer: 59 | Admitting: Hematology and Oncology

## 2017-02-22 ENCOUNTER — Ambulatory Visit: Payer: 59

## 2017-02-23 ENCOUNTER — Ambulatory Visit (HOSPITAL_BASED_OUTPATIENT_CLINIC_OR_DEPARTMENT_OTHER): Payer: 59

## 2017-02-23 ENCOUNTER — Encounter: Payer: Self-pay | Admitting: Hematology and Oncology

## 2017-02-23 ENCOUNTER — Telehealth: Payer: Self-pay | Admitting: Hematology and Oncology

## 2017-02-23 ENCOUNTER — Ambulatory Visit (HOSPITAL_BASED_OUTPATIENT_CLINIC_OR_DEPARTMENT_OTHER): Payer: 59 | Admitting: Hematology and Oncology

## 2017-02-23 VITALS — BP 131/79 | HR 91 | Temp 98.5°F | Resp 16 | Wt 201.0 lb

## 2017-02-23 DIAGNOSIS — G620 Drug-induced polyneuropathy: Secondary | ICD-10-CM | POA: Diagnosis not present

## 2017-02-23 DIAGNOSIS — R739 Hyperglycemia, unspecified: Secondary | ICD-10-CM | POA: Diagnosis not present

## 2017-02-23 DIAGNOSIS — C541 Malignant neoplasm of endometrium: Secondary | ICD-10-CM

## 2017-02-23 DIAGNOSIS — T50905A Adverse effect of unspecified drugs, medicaments and biological substances, initial encounter: Secondary | ICD-10-CM

## 2017-02-23 DIAGNOSIS — Z5111 Encounter for antineoplastic chemotherapy: Secondary | ICD-10-CM

## 2017-02-23 DIAGNOSIS — T451X5A Adverse effect of antineoplastic and immunosuppressive drugs, initial encounter: Secondary | ICD-10-CM

## 2017-02-23 LAB — COMPREHENSIVE METABOLIC PANEL
ALBUMIN: 4 g/dL (ref 3.5–5.0)
ALK PHOS: 127 U/L (ref 40–150)
ALT: 28 U/L (ref 0–55)
AST: 20 U/L (ref 5–34)
Anion Gap: 12 mEq/L — ABNORMAL HIGH (ref 3–11)
BILIRUBIN TOTAL: 0.31 mg/dL (ref 0.20–1.20)
BUN: 15.3 mg/dL (ref 7.0–26.0)
CALCIUM: 9.7 mg/dL (ref 8.4–10.4)
CO2: 19 mEq/L — ABNORMAL LOW (ref 22–29)
Chloride: 109 mEq/L (ref 98–109)
Creatinine: 0.8 mg/dL (ref 0.6–1.1)
EGFR: >60 mL/min/{1.73_m2} (ref >60)
GLUCOSE: 112 mg/dL (ref 70–140)
POTASSIUM: 4 meq/L (ref 3.5–5.1)
SODIUM: 140 meq/L (ref 136–145)
TOTAL PROTEIN: 7 g/dL (ref 6.4–8.3)

## 2017-02-23 LAB — CBC WITH DIFFERENTIAL/PLATELET
BASO%: 0.1 % (ref 0.0–2.0)
Basophils Absolute: 0 10*3/uL (ref 0.0–0.1)
EOS%: 0.4 % (ref 0.0–7.0)
Eosinophils Absolute: 0 10*3/uL (ref 0.0–0.5)
HCT: 34 % — ABNORMAL LOW (ref 34.8–46.6)
HEMOGLOBIN: 11.3 g/dL — AB (ref 11.6–15.9)
LYMPH%: 15 % (ref 14.0–49.7)
MCH: 29.1 pg (ref 25.1–34.0)
MCHC: 33.2 g/dL (ref 31.5–36.0)
MCV: 87.6 fL (ref 79.5–101.0)
MONO#: 0.2 10*3/uL (ref 0.1–0.9)
MONO%: 2.2 % (ref 0.0–14.0)
NEUT%: 82.3 % — ABNORMAL HIGH (ref 38.4–76.8)
NEUTROS ABS: 5.5 10*3/uL (ref 1.5–6.5)
Platelets: 148 10*3/uL (ref 145–400)
RBC: 3.88 10*6/uL (ref 3.70–5.45)
RDW: 17.5 % — ABNORMAL HIGH (ref 11.2–14.5)
WBC: 6.7 10*3/uL (ref 3.9–10.3)
lymph#: 1 10*3/uL (ref 0.9–3.3)

## 2017-02-23 MED ORDER — SODIUM CHLORIDE 0.9 % IV SOLN
Freq: Once | INTRAVENOUS | Status: AC
  Administered 2017-02-23: 14:00:00 via INTRAVENOUS
  Filled 2017-02-23: qty 5

## 2017-02-23 MED ORDER — PALONOSETRON HCL INJECTION 0.25 MG/5ML
INTRAVENOUS | Status: AC
  Filled 2017-02-23: qty 5

## 2017-02-23 MED ORDER — FAMOTIDINE IN NACL 20-0.9 MG/50ML-% IV SOLN
20.0000 mg | Freq: Once | INTRAVENOUS | Status: AC
  Administered 2017-02-23: 20 mg via INTRAVENOUS

## 2017-02-23 MED ORDER — SODIUM CHLORIDE 0.9 % IV SOLN
Freq: Once | INTRAVENOUS | Status: AC
  Administered 2017-02-23: 13:00:00 via INTRAVENOUS

## 2017-02-23 MED ORDER — DIPHENHYDRAMINE HCL 50 MG/ML IJ SOLN
INTRAMUSCULAR | Status: AC
  Filled 2017-02-23: qty 1

## 2017-02-23 MED ORDER — FAMOTIDINE IN NACL 20-0.9 MG/50ML-% IV SOLN
INTRAVENOUS | Status: AC
  Filled 2017-02-23: qty 50

## 2017-02-23 MED ORDER — PACLITAXEL CHEMO INJECTION 300 MG/50ML
105.0000 mg/m2 | Freq: Once | INTRAVENOUS | Status: AC
  Administered 2017-02-23: 216 mg via INTRAVENOUS
  Filled 2017-02-23: qty 36

## 2017-02-23 MED ORDER — PALONOSETRON HCL INJECTION 0.25 MG/5ML
0.2500 mg | Freq: Once | INTRAVENOUS | Status: AC
  Administered 2017-02-23: 0.25 mg via INTRAVENOUS

## 2017-02-23 MED ORDER — DIPHENHYDRAMINE HCL 50 MG/ML IJ SOLN
50.0000 mg | Freq: Once | INTRAMUSCULAR | Status: AC
  Administered 2017-02-23: 50 mg via INTRAVENOUS

## 2017-02-23 MED ORDER — HEPARIN SOD (PORK) LOCK FLUSH 100 UNIT/ML IV SOLN
500.0000 [IU] | Freq: Once | INTRAVENOUS | Status: DC | PRN
  Filled 2017-02-23: qty 5

## 2017-02-23 MED ORDER — SODIUM CHLORIDE 0.9% FLUSH
10.0000 mL | INTRAVENOUS | Status: DC | PRN
  Filled 2017-02-23: qty 10

## 2017-02-23 MED ORDER — MORPHINE SULFATE 15 MG PO TABS: 15.0000 mg | ORAL_TABLET | ORAL | 0 refills | Status: DC | PRN

## 2017-02-23 MED ORDER — SODIUM CHLORIDE 0.9 % IV SOLN
650.0000 mg | Freq: Once | INTRAVENOUS | Status: AC
  Administered 2017-02-23: 650 mg via INTRAVENOUS
  Filled 2017-02-23: qty 65

## 2017-02-23 NOTE — Progress Notes (Signed)
[place] OFFICE PROGRESS NOTE  Patient Care Team: [place], [place] as PCP - General (Family Medicine)  SUMMARY OF ONCOLOGIC HISTORY:   Endometrial cancer ([place])   08/11/2016 Procedure    Procedure: hysteroscopy, polypectomy and curettage with MyoSure       08/13/2016 Pathology Results    Endometrium, curettage, and endometrial polyp - ENDOMETRIAL CARCINOMA, SEE COMMENT. 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 There is a small focus (5%) of clear cell carcinoma and a few foci suspicious for serous carcinoma.       08/27/2016 Imaging    Ct scan of abdomen 1. No findings of adenopathy or metastatic disease in the abdomen or pelvis. 2. 2 by 3 mm peripheral right lower lobe pulmonary nodule on image 11/7. Highly likely to be benign based on morphology      09/01/2016 Pathology Results    1. Lymph node, sentinel, biopsy, right external iliac - ONE BENIGN LYMPH NODE (0/1). 2. Lymph node, sentinel, biopsy, right obturator - ONE BENIGN LYMPH NODE (0/1). 3. Lymph node, sentinel, biopsy, distal left external iliac - ONE BENIGN LYMPH NODE (0/1). 4. Lymph node, sentinel, biopsy, left proximal external iliac - ONE BENIGN LYMPH NODE (0/1). 5. Uterus +/- tubes/ovaries, neoplastic, cervix - ENDOMETRIAL ADENOCARCINOMA. - LUMINAL INVOLVEMENT OF RIGHT AND LEFT FALLOPIAN TUBES. - CERVIX AND BILATERAL OVARIES FREE OF TUMOR. - SEE ONCOLOGY TABLE AND COMMENT. Microscopic Comment 1. - 4. Immunohistochemistry for cytokeratin AE1/AE3 is performed on all of the lymph nodes (parts 1-4) and no positivity is identified. 5. ONCOLOGY TABLE-UTERUS, CARCINOMA OR CARCINOSARCOMA  Specimen: Uterus with bilateral fallopian tubes and ovaries with right and left pelvic lymph nodes Procedure: Hysterectomy with bilateral salpingo-oophorectomy Lymph node sampling performed: Yes Specimen integrity:  Intact Maximum tumor size: 1.8 cm Histologic type: Endometrial adenocarcinoma, see comment. Grade: II Myometrial invasion: 0 cm where myometrium is 1.1 cm in thickness Cervical stromal involvement: No Extent of involvement of other organs: Luminal involvement of right and left fallopian tubes, see comment. Lymph - vascular invasion: Not identified  Peritoneal washings: N/A Lymph nodes: Examined: 4 Sentinel 0 Non-sentinel 4 Total Lymph nodes with metastasis: 0 Isolated tumor cells (< 0.2 mm): 0 Micrometastasis: (> 0.2 mm and < 2.0 mm): 0 Macrometastasis: (> 2.0 mm): 0 Extracapsular extension: N/A Pelvic lymph nodes: 0 involved of 4 lymph nodes. Para-aortic lymph nodes: 0 involved of 0 lymph nodes. Other (specify involvement and site): N/A TNM code: pT3a, pN0 FIGO Stage (based on pathologic findings, needs clinical correlation): III-A  Comment: The endometrial tumor is a moderately differentiated mostly endometrioid adenocarcinoma with rare foci of squamous differentiation and a rare microscopic focus consistent with serous differentiation. There is also a microscopic focus with chondroid stroma which is associated with an adjacent microscopic serous component. This finding suggests a microscopic stromal component which is less than 1 mm in greatest dimension. In addition, there are luminal foci of carcinoma within the right and fallopian tubes and the right fallopian tube lumen involvement shows a focus of similar appearing chondroid stroma with adjacent microscopic focus of serous carcinoma.      09/01/2016 Surgery    Surgeon: Donaciano Eva   Operation: Robotic-assisted laparoscopic total hysterectomy with bilateral salpingoophorectomy, Sentinel lymph node biopsy  Operative Findings:  : 6cm normal appearing uterus, normal tubes and ovaries, no suspicious lymph nodes, no apparent peritoneal disease.       11/10/2016 Procedure    Successful placement  of a right IJ  approach Power Port with ultrasound and fluoroscopic guidance. The catheter is ready for use.      11/12/2016 -  Chemotherapy    She received carboplatin & Taxol      12/02/2016 Adverse Reaction    She had severe neuropathy from cycle 1 of treatment. Dose of chemo reduced from cycle 2 onwards       INTERVAL HISTORY: Please see below for problem oriented charting. She is in prior to cycle 6 of treatment She is doing well. Peripheral neuropathy is stable She denies nausea or vomiting Her pain is well controlled with current prescription morphine sulfate  REVIEW OF SYSTEMS:   Constitutional: Denies fevers, chills or abnormal weight loss Eyes: Denies blurriness of vision Ears, nose, mouth, throat, and face: Denies mucositis or sore throat Respiratory: Denies cough, dyspnea or wheezes Cardiovascular: Denies palpitation, chest discomfort or lower extremity swelling Gastrointestinal:  Denies nausea, heartburn or change in bowel habits Skin: Denies abnormal skin rashes Lymphatics: Denies new lymphadenopathy or easy bruising Neurological:Denies numbness, tingling or new weaknesses Behavioral/Psych: Mood is stable, no new changes  All other systems were reviewed with the patient and are negative.  I have reviewed the past medical history, past surgical history, social history and family history with the patient and they are unchanged from previous note.  ALLERGIES:  is allergic to latex and tamiflu  [oseltamivir phosphate].  MEDICATIONS:  Current Outpatient Medications  Medication Sig Dispense Refill  . Ascorbic Acid (VITAMIN C PO) Take 1 tablet by mouth 3 (three) times daily as needed (for immune health support or cold symptoms.).    Marland Kitchen dexamethasone (DECADRON) 4 MG tablet TAKE 5 TABLETS BY MOUTH THE NIGHT BEFORE SCHEDULED CHEMO AND ANOTHER 5 TABLETS AT 6 AM THE DAY OF CHEMO, EVERY 21 DAYS 30 tablet 1  . ECHINACEA HERB PO Take by mouth.    . Flaxseed Oil OIL Take by mouth.    .  lidocaine-prilocaine (EMLA) cream Apply to affected area once 30 g 3  . morphine (MSIR) 15 MG tablet Take 1 tablet (15 mg total) by mouth every 4 (four) hours as needed for severe pain. 90 tablet 0  . ondansetron (ZOFRAN) 8 MG tablet Take 1 tablet (8 mg total) by mouth 2 (two) times daily as needed for refractory nausea / vomiting. Start on day 3 after chemo. 30 tablet 1  . prochlorperazine (COMPAZINE) 10 MG tablet Take 1 tablet (10 mg total) by mouth every 6 (six) hours as needed (Nausea or vomiting). 30 tablet 1  . saccharomyces boulardii (FLORASTOR) 250 MG capsule Take 250 mg by mouth daily.     No current facility-administered medications for this visit.    Facility-Administered Medications Ordered in Other Visits  Medication Dose Route Frequency Provider Last Rate Last Dose  . 0.9 %  sodium chloride infusion   Intravenous Once Alvy Bimler, Karrin Eisenmenger, MD      . CARBOplatin (PARAPLATIN) 650 mg in sodium chloride 0.9 % 250 mL chemo infusion  650 mg Intravenous Once Alvy Bimler, Luciann Gossett, MD      . diphenhydrAMINE (BENADRYL) injection 50 mg  50 mg Intravenous Once Alvy Bimler, Ailani Governale, MD      . famotidine (PEPCID) IVPB 20 mg premix  20 mg Intravenous Once Alvy Bimler, Edmar Blankenburg, MD      . fosaprepitant (EMEND) 150 mg, dexamethasone (DECADRON) 12 mg in sodium chloride 0.9 % 145 mL IVPB   Intravenous Once Alvy Bimler, Johnanna Bakke, MD      . heparin lock flush 100  unit/mL  500 Units Intracatheter Once PRN Alvy Bimler, Amirrah Quigley, MD      . PACLitaxel (TAXOL) 216 mg in dextrose 5 % 250 mL chemo infusion (> 80mg /m2)  105 mg/m2 (Treatment Plan Recorded) Intravenous Once Alvy Bimler, Mairlyn Tegtmeyer, MD      . palonosetron (ALOXI) injection 0.25 mg  0.25 mg Intravenous Once Elida Harbin, MD      . sodium chloride flush (NS) 0.9 % injection 10 mL  10 mL Intracatheter PRN Alvy Bimler, Artis Buechele, MD        PHYSICAL EXAMINATION: ECOG PERFORMANCE STATUS: 1 - Symptomatic but completely ambulatory GENERAL:alert, no distress and comfortable SKIN: skin color, texture, turgor are normal, no rashes or  significant lesions EYES: normal, Conjunctiva are pink and non-injected, sclera clear OROPHARYNX:no exudate, no erythema and lips, buccal mucosa, and tongue normal  NECK: supple, thyroid normal size, non-tender, without nodularity LYMPH:  no palpable lymphadenopathy in the cervical, axillary or inguinal LUNGS: clear to auscultation and percussion with normal breathing effort HEART: regular rate & rhythm and no murmurs and no lower extremity edema ABDOMEN:abdomen soft, non-tender and normal bowel sounds Musculoskeletal:no cyanosis of digits and no clubbing  NEURO: alert & oriented x 3 with fluent speech, no focal motor/sensory deficits  LABORATORY DATA:  I have reviewed the data as listed    Component Value Date/Time   NA 140 02/23/2017 1147   K 4.0 02/23/2017 1147   CL 105 11/10/2016 1237   CO2 19 (L) 02/23/2017 1147   GLUCOSE 112 02/23/2017 1147   BUN 15.3 02/23/2017 1147   CREATININE 0.8 02/23/2017 1147   CALCIUM 9.7 02/23/2017 1147   PROT 7.0 02/23/2017 1147   ALBUMIN 4.0 02/23/2017 1147   AST 20 02/23/2017 1147   ALT 28 02/23/2017 1147   ALKPHOS 127 02/23/2017 1147   BILITOT 0.31 02/23/2017 1147   GFRNONAA >60 11/10/2016 1237   GFRAA >60 11/10/2016 1237    No results found for: SPEP, UPEP  Lab Results  Component Value Date   WBC 6.7 02/23/2017   NEUTROABS 5.5 02/23/2017   HGB 11.3 (L) 02/23/2017   HCT 34.0 (L) 02/23/2017   MCV 87.6 02/23/2017   PLT 148 02/23/2017      Chemistry      Component Value Date/Time   NA 140 02/23/2017 1147   K 4.0 02/23/2017 1147   CL 105 11/10/2016 1237   CO2 19 (L) 02/23/2017 1147   BUN 15.3 02/23/2017 1147   CREATININE 0.8 02/23/2017 1147      Component Value Date/Time   CALCIUM 9.7 02/23/2017 1147   ALKPHOS 127 02/23/2017 1147   AST 20 02/23/2017 1147   ALT 28 02/23/2017 1147   BILITOT 0.31 02/23/2017 1147       ASSESSMENT & PLAN:  Endometrial cancer ([place]) She tolerated last cycle treatment well with dose  adjustment I will continue the same treatment without modification Continue supportive care Plan to get her port removed by the end of the year  Peripheral neuropathy due to chemotherapy [facility]) She will continue pain medicine as needed I recommend B complex supplement  Drug-induced hyperglycemia She had mild hyperglycemia due to steroid treatment, improved with reduced dose dexamethasone Will monitor closely   No orders of the defined types were placed in this encounter.  All questions were answered. The patient knows to call the clinic with any problems, questions or concerns. No barriers to learning was detected. I spent 15 minutes counseling the patient face to face. The total time spent in the appointment was  20 minutes and more than 50% was on counseling and review of test results     Heath Lark, MD 02/23/2017 1:02 PM

## 2017-02-23 NOTE — Telephone Encounter (Signed)
Patient came in DR said it was ok to schedule today

## 2017-02-23 NOTE — Patient Instructions (Signed)
   [facility] Discharge Instructions for Patients Receiving Chemotherapy  Today you received the following chemotherapy agents Taxol and Carboplatin   To help prevent nausea and vomiting after your treatment, we encourage you to take your nausea medication as directed.    If you develop nausea and vomiting that is not controlled by your nausea medication, call the clinic.   BELOW ARE SYMPTOMS THAT SHOULD BE REPORTED IMMEDIATELY:  *FEVER GREATER THAN 100.5 F  *CHILLS WITH OR WITHOUT FEVER  NAUSEA AND VOMITING THAT IS NOT CONTROLLED WITH YOUR NAUSEA MEDICATION  *UNUSUAL SHORTNESS OF BREATH  *UNUSUAL BRUISING OR BLEEDING  TENDERNESS IN MOUTH AND THROAT WITH OR WITHOUT PRESENCE OF ULCERS  *URINARY PROBLEMS  *BOWEL PROBLEMS  UNUSUAL RASH Items with * indicate a potential emergency and should be followed up as soon as possible.  Feel free to call the clinic should you have any questions or concerns. The clinic phone number is [phone].  Please show the CHEMO ALERT CARD at check-in to the Emergency Department and triage nurse.   

## 2017-02-23 NOTE — Assessment & Plan Note (Signed)
She tolerated last cycle treatment well with dose adjustment I will continue the same treatment without modification Continue supportive care Plan to get her port removed by the end of the year

## 2017-02-23 NOTE — Assessment & Plan Note (Signed)
She will continue pain medicine as needed I recommend B complex supplement

## 2017-02-23 NOTE — Assessment & Plan Note (Addendum)
She had mild hyperglycemia due to steroid treatment, improved with reduced dose dexamethasone Will monitor closely

## 2017-02-24 MED FILL — MORPHINE SULFATE IR 15 MG T: 15 | 11 days supply | Qty: 70 | Fill #0

## 2017-02-28 ENCOUNTER — Telehealth: Payer: Self-pay | Admitting: Hematology and Oncology

## 2017-02-28 NOTE — Telephone Encounter (Signed)
Scheduled appt per 12/11 los - left voicemail for patient regarding appt - sending out a confirmation letter in the mail.

## 2017-03-03 ENCOUNTER — Other Ambulatory Visit: Payer: Self-pay | Admitting: Hematology and Oncology

## 2017-03-03 DIAGNOSIS — C541 Malignant neoplasm of endometrium: Secondary | ICD-10-CM

## 2017-03-08 ENCOUNTER — Other Ambulatory Visit: Payer: Self-pay | Admitting: Radiology

## 2017-03-10 ENCOUNTER — Other Ambulatory Visit: Payer: Self-pay | Admitting: Radiology

## 2017-03-11 ENCOUNTER — Ambulatory Visit (HOSPITAL_COMMUNITY)
Admission: RE | Admit: 2017-03-11 | Discharge: 2017-03-11 | Disposition: A | Discharge status: Home / Self Care | Payer: 59 | Source: Ambulatory Visit | Attending: Hematology and Oncology | Admitting: Hematology and Oncology

## 2017-03-11 ENCOUNTER — Encounter (HOSPITAL_COMMUNITY): Payer: Self-pay

## 2017-03-11 DIAGNOSIS — Z8542 Personal history of malignant neoplasm of other parts of uterus: Secondary | ICD-10-CM | POA: Diagnosis not present

## 2017-03-11 DIAGNOSIS — Z79899 Other long term (current) drug therapy: Secondary | ICD-10-CM | POA: Insufficient documentation

## 2017-03-11 DIAGNOSIS — Z87891 Personal history of nicotine dependence: Secondary | ICD-10-CM | POA: Diagnosis not present

## 2017-03-11 DIAGNOSIS — Z9221 Personal history of antineoplastic chemotherapy: Secondary | ICD-10-CM | POA: Diagnosis not present

## 2017-03-11 DIAGNOSIS — C541 Malignant neoplasm of endometrium: Secondary | ICD-10-CM

## 2017-03-11 DIAGNOSIS — Z452 Encounter for adjustment and management of vascular access device: Secondary | ICD-10-CM | POA: Diagnosis present

## 2017-03-11 HISTORY — PX: IR REMOVAL TUN ACCESS W/ PORT W/O FL MOD SED: IMG2290

## 2017-03-11 LAB — BASIC METABOLIC PANEL
ANION GAP: 10 (ref 5–15)
BUN: 12 mg/dL (ref 6–20)
CHLORIDE: 103 mmol/L (ref 101–111)
CO2: 24 mmol/L (ref 22–32)
Calcium: 9.2 mg/dL (ref 8.9–10.3)
Creatinine, Ser: 0.74 mg/dL (ref 0.44–1.00)
GFR calc non Af Amer: >60 mL/min (ref >60)
Glucose, Bld: 99 mg/dL (ref 65–99)
POTASSIUM: 3.8 mmol/L (ref 3.5–5.1)
SODIUM: 137 mmol/L (ref 135–145)

## 2017-03-11 LAB — CBC
HCT: 33.3 % — ABNORMAL LOW (ref 36.0–46.0)
HEMOGLOBIN: 10.9 g/dL — AB (ref 12.0–15.0)
MCH: 29.9 pg (ref 26.0–34.0)
MCHC: 32.7 g/dL (ref 30.0–36.0)
MCV: 91.2 fL (ref 78.0–100.0)
PLATELETS: 165 10*3/uL (ref 150–400)
RBC: 3.65 MIL/uL — AB (ref 3.87–5.11)
RDW: 16.9 % — ABNORMAL HIGH (ref 11.5–15.5)
WBC: 3.4 10*3/uL — AB (ref 4.0–10.5)

## 2017-03-11 LAB — PROTIME-INR
INR: 0.86
Prothrombin Time: 11.7 seconds (ref 11.4–15.2)

## 2017-03-11 LAB — APTT: aPTT: 27 seconds (ref 24–36)

## 2017-03-11 MED ORDER — CEFAZOLIN SODIUM-DEXTROSE 2-4 GM/100ML-% IV SOLN
2.0000 g | INTRAVENOUS | Status: AC
  Administered 2017-03-11: 2 g via INTRAVENOUS

## 2017-03-11 MED ORDER — LIDOCAINE-EPINEPHRINE (PF) 1 %-1:200000 IJ SOLN
INTRAMUSCULAR | Status: AC
  Filled 2017-03-11: qty 30

## 2017-03-11 MED ORDER — FENTANYL CITRATE (PF) 100 MCG/2ML IJ SOLN
INTRAMUSCULAR | Status: AC | PRN
  Administered 2017-03-11: 50 ug via INTRAVENOUS
  Administered 2017-03-11: 25 ug via INTRAVENOUS

## 2017-03-11 MED ORDER — SODIUM CHLORIDE 0.9 % IV SOLN
INTRAVENOUS | Status: DC
  Administered 2017-03-11: 09:00:00 via INTRAVENOUS

## 2017-03-11 MED ORDER — MIDAZOLAM HCL 2 MG/2ML IJ SOLN
INTRAMUSCULAR | Status: AC | PRN
  Administered 2017-03-11: 1 mg via INTRAVENOUS

## 2017-03-11 MED ORDER — LIDOCAINE HCL 1 % IJ SOLN
INTRAMUSCULAR | Status: AC
  Filled 2017-03-11: qty 20

## 2017-03-11 MED ORDER — CEFAZOLIN SODIUM-DEXTROSE 2-4 GM/100ML-% IV SOLN
INTRAVENOUS | Status: AC
  Filled 2017-03-11: qty 100

## 2017-03-11 MED ORDER — MIDAZOLAM HCL 2 MG/2ML IJ SOLN
INTRAMUSCULAR | Status: AC
  Filled 2017-03-11: qty 2

## 2017-03-11 MED ORDER — CHLORHEXIDINE GLUCONATE 4 % EX LIQD
CUTANEOUS | Status: AC
  Filled 2017-03-11: qty 15

## 2017-03-11 MED ORDER — FENTANYL CITRATE (PF) 100 MCG/2ML IJ SOLN
INTRAMUSCULAR | Status: AC
  Filled 2017-03-11: qty 2

## 2017-03-11 MED ORDER — LIDOCAINE-EPINEPHRINE (PF) 2 %-1:200000 IJ SOLN
INTRAMUSCULAR | Status: AC | PRN
  Administered 2017-03-11: 10 mL

## 2017-03-11 NOTE — Sedation Documentation (Signed)
Patient is resting comfortably. 

## 2017-03-11 NOTE — H&P (Signed)
Chief Complaint: Patient was seen in consultation today for [facility] a cath removal at the request of [place]  Referring Physician(s): Heath Lark  Supervising Physician: Daryll Brod  Patient Status: [facility] - Out-pt  History of Present Illness: Danielle MEINHART is a 51 y.o. female   Endometrial cancer PAC placed in IR 10/2016 All chemo is complete Now for removal per Dr Alvy Bimler  Past Medical History:  Diagnosis Date  . Cancer ([place]) 2018   endometrial   . Headache    hx migraines yrs ago  . Malaria as child  . Miscarriage    x 2  . Pneumonia yrs ago  . 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 DENTAL SURGERY     gum graft 20 yrs ago and again on 08/26/16  . DILATATION & CURETTAGE/HYSTEROSCOPY WITH MYOSURE N/A 08/11/2016   Procedure: DILATATION & CURETTAGE/HYSTEROSCOPY WITH MYOSURE;  Surgeon: Jerelyn Charles, MD;  Location: [facility];  Service: Gynecology;  Laterality: N/A;  polyp removal  . IR FLUORO GUIDE PORT INSERTION RIGHT  11/10/2016  . IR US GUIDE VASC ACCESS RIGHT  11/10/2016  . LYMPH NODE BIOPSY N/A 09/01/2016   Procedure: SENTINEL LYMPH NODE BIOPSY;  Surgeon: Everitt Amber, MD;  Location: [facility];  Service: Gynecology;  Laterality: N/A;  . ROBOTIC ASSISTED TOTAL HYSTERECTOMY WITH BILATERAL SALPINGO OOPHERECTOMY Bilateral 09/01/2016   Procedure: ROBOTIC ASSISTED TOTAL HYSTERECTOMY WITH BILATERAL SALPINGO OOPHORECTOMY;  Surgeon: Everitt Amber, MD;  Location: [facility];  Service: Gynecology;  Laterality: Bilateral;  . WISDOM TOOTH EXTRACTION      Allergies: Latex and Tamiflu  [oseltamivir phosphate]  Medications: Prior to Admission medications   Medication Sig Start Date End Date Taking? Authorizing Provider  Ascorbic Acid (VITAMIN C PO) Take 1 tablet by mouth 3 (three) times daily as needed (for immune health support or cold symptoms.).   Yes [provider]  b complex vitamins tablet  Take 1 tablet by mouth daily.   Yes [provider]  morphine (MSIR) 15 MG tablet Take 1 tablet (15 mg total) by mouth every 4 (four) hours as needed for severe pain. 02/23/17  Yes Gorsuch, Ni, MD  PREBIOTIC PRODUCT PO Take 1 capsule by mouth daily.   Yes [provider]  Probiotic Product (PROBIOTIC DAILY PO) Take 1 capsule by mouth daily.   Yes [provider]  dexamethasone (DECADRON) 4 MG tablet TAKE 5 TABLETS BY MOUTH THE NIGHT BEFORE SCHEDULED CHEMO AND ANOTHER 5 TABLETS AT 6 AM THE DAY OF CHEMO, EVERY 21 DAYS Patient not taking: Reported on 03/04/2017 01/14/17   Heath Lark, MD  lidocaine-prilocaine (EMLA) cream Apply to affected area once Patient not taking: Reported on 03/04/2017 11/04/16   Heath Lark, MD  ondansetron (ZOFRAN) 8 MG tablet Take 1 tablet (8 mg total) by mouth 2 (two) times daily as needed for refractory nausea / vomiting. Start on day 3 after chemo. Patient not taking: Reported on 03/04/2017 11/04/16   Heath Lark, MD  prochlorperazine (COMPAZINE) 10 MG tablet Take 1 tablet (10 mg total) by mouth every 6 (six) hours as needed (Nausea or vomiting). Patient not taking: Reported on 03/04/2017 11/04/16   Heath Lark, MD     Family History  Problem Relation Age of Onset  . Cancer Mother   . Cancer Sister        Cervix  . Cancer Maternal Aunt  Breast, uterine  . Cancer Paternal Grandmother        Colon    Social History   Socioeconomic History  . Marital status: Married    Spouse name: [name]  . Number of children: 0  . Years of education: None  . Highest education level: None  Social Needs  . Financial resource strain: None  . Food insecurity - worry: None  . Food insecurity - inability: None  . Transportation needs - medical: None  . Transportation needs - non-medical: None  Occupational History  . Occupation: massage therapist  Tobacco Use  . Smoking status: Former Smoker    Packs/day: 1.50    Years: 8.00    Pack  years: 12.00    Types: Cigarettes  . Smokeless tobacco: Never Used  . Tobacco comment: quit smoking at age 58  Substance and Sexual Activity  . Alcohol use: Yes    Alcohol/week: 2.4 oz    Types: 4 Glasses of wine per week    Comment: wine occ  . Drug use: No  . Sexual activity: Yes    Birth control/protection: Post-menopausal  Other Topics Concern  . None  Social History Narrative  . None    Review of Systems: A 12 point ROS discussed and pertinent positives are indicated in the HPI above.  All other systems are negative.  Review of Systems  Constitutional: Negative for activity change and fever.  Respiratory: Negative for cough and shortness of breath.   Gastrointestinal: Negative for abdominal pain.  Neurological: Negative for weakness.  Psychiatric/Behavioral: Negative for behavioral problems and confusion.    Vital Signs: BP 116/76   Pulse 63   Temp 98.2 F (36.8 C) (Oral)   Resp 16   Ht 5\' 6"  (1.676 m)   Wt 198 lb (89.8 kg)   SpO2 100%   BMI 31.96 kg/m   Physical Exam  Constitutional: She is oriented to person, place, and time.  Cardiovascular: Normal rate, regular rhythm and normal heart sounds.  Pulmonary/Chest: Effort normal and breath sounds normal.  Abdominal: Soft. Bowel sounds are normal.  Musculoskeletal: Normal range of motion.  Neurological: She is alert and oriented to person, place, and time.  Skin: Skin is warm and dry.  Psychiatric: She has a normal mood and affect. Her behavior is normal. Judgment and thought content normal.  Nursing note and vitals reviewed.   Imaging: No results found.  Labs:  CBC: Recent Labs    01/13/17 0841 02/02/17 0842 02/23/17 1148 03/11/17 0716  WBC 4.7 6.6 6.7 3.4*  HGB 12.2 11.9 11.3* 10.9*  HCT 37.0 35.8 34.0* 33.3*  PLT 190 152 148 165    COAGS: Recent Labs    11/10/16 1237 03/11/17 0716  INR 0.93 0.86  APTT 29 27    BMP: Recent Labs    08/27/16 1422 09/02/16 0538 11/10/16 1237   01/13/17 0841 02/02/17 0842 02/23/17 1147 03/11/17 0716  NA 140 137 139   < > 139 138 140 137  K 4.5 4.2 3.8   < > 3.9 3.9 4.0 3.8  CL 103 103 105  --   --   --   --  103  CO2 27 26 25    < > 21* 18* 19* 24  GLUCOSE 96 137* 90   < > 148* 215* 112 99  BUN 13 7 12    < > 10.5 11.6 15.3 12  CALCIUM 9.4 8.8* 9.3   < > 9.7 9.7 9.7 9.2  CREATININE 0.89  0.83 0.73   < > 0.8 0.8 0.8 0.74  GFRNONAA >60 >60 >60  --   --   --   --  >60  GFRAA >60 >60 >60  --   --   --   --  >60   < > = values in this interval not displayed.    LIVER FUNCTION TESTS: Recent Labs    12/23/16 0843 01/13/17 0841 02/02/17 0842 02/23/17 1147  BILITOT 0.35 0.28 0.33 0.31  AST 16 17 15 20   ALT 28 25 28 28   ALKPHOS 130 134 135 127  PROT 7.4 7.2 7.2 7.0  ALBUMIN 3.9 3.9 3.9 4.0    TUMOR MARKERS: No results for input(s): AFPTM, CEA, CA199, CHROMGRNA in the last 8760 hours.  Assessment and Plan:  Endometrial cancer Chemotherapy complete For PAC removal Pt is aware of procedure benefits and risks including but not limited to: Infection; bleeding; vessel damage. Agreeable to proceed; consent signed and in chart  Thank you for this interesting consult.  I greatly enjoyed meeting Danielle Guzman and look forward to participating in their care.  A copy of this report was sent to the requesting provider on this date.  Electronically Signed: Lavonia Drafts, PA-C 03/11/2017, 8:09 AM   I spent a total of    25 Minutes in face to face in clinical consultation, greater than 50% of which was counseling/coordinating care for [facility] remoaval

## 2017-03-11 NOTE — Sedation Documentation (Signed)
Patient denies pain and is resting comfortably.  

## 2017-03-11 NOTE — Procedures (Signed)
Endometrial ca  S/p port removal No comp Stable Full report in pacs

## 2017-03-11 NOTE — Discharge Instructions (Addendum)
Implanted Port Removal Implanted port removal is a procedure to remove the port and catheter (port-a-cath) that is implanted under your skin. The port is a small disc under your skin that can be punctured with a needle. It is connected to a vein in your chest or neck by a small flexible tube (catheter). The port-a-cath is used for treatment through an IV tube and for taking blood samples. Your health care provider will remove the port-a-cath if:  You no longer need it for treatment.  It is not working properly.  The area around it gets infected.  Tell a health care provider about:  Any allergies you have.  All medicines you are taking, including vitamins, herbs, eye drops, creams, and over-the-counter medicines.  Any problems you or family members have had with anesthetic medicines.  Any blood disorders you have.  Any surgeries you have had.  Any medical conditions you have.  Whether you are pregnant or may be pregnant. What are the risks? Generally, this is a safe procedure. However, problems may occur, including:  Infection.  Bleeding.  Allergic reactions to anesthetic medicines.  Damage to nerves or blood vessels.  What happens before the procedure?  You will have: ? A physical exam. ? Blood tests. ? Imaging tests, including a chest X-ray.  Follow instructions from your health care provider about eating or drinking restrictions.  Ask your health care provider about: ? Changing or stopping your regular medicines. This is especially important if you are taking diabetes medicines or blood thinners. ? Taking medicines such as aspirin and ibuprofen. These medicines can thin your blood. Do not take these medicines before your procedure if your surgeon instructs you not to.  Ask your health care provider how your surgical site will be marked or identified.  You may be given antibiotic medicine to help prevent infection.  Plan to have someone take you home after the  procedure.  If you will be going home right after the procedure, plan to have someone stay with you for 24 hours. What happens during the procedure?  To reduce your risk of infection: ? Your health care team will wash or sanitize their hands. ? Your skin will be washed with soap.  You may be given one or more of the following: ? A medicine to help you relax (sedative). ? A medicine to numb the area (local anesthetic).  A small cut (incision) will be made at the site of your port-a-cath.  The port-a-cath and the catheter that has been inside your vein will gently be removed.  The incision will be closed with stitches (sutures), adhesive strips, or skin glue.  A bandage (dressing) will be placed over the incision. The procedure may vary among health care providers and hospitals. What happens after the procedure?  Your blood pressure, heart rate, breathing rate, and blood oxygen level will be monitored often until the medicines you were given have worn off.  Do not drive for 24 hours if you received a sedative. This information is not intended to replace advice given to you by your health care provider. Make sure you discuss any questions you have with your health care provider. Document Released: 02/11/2015 Document Revised: 08/08/2015 Document Reviewed: 12/05/2014 Elsevier Interactive Patient Education  2018 [place]. Moderate Conscious Sedation, Adult, Care After These instructions provide you with information about caring for yourself after your procedure. Your health care provider may also give you more specific instructions. Your treatment has been planned according to current medical  practices, but problems sometimes occur. Call your health care provider if you have any problems or questions after your procedure. What can I expect after the procedure? After your procedure, it is common:  To feel sleepy for several hours.  To feel clumsy and have poor balance for several  hours.  To have poor judgment for several hours.  To vomit if you eat too soon.  Follow these instructions at home: For at least 24 hours after the procedure:   Do not: ? Participate in activities where you could fall or become injured. ? Drive. ? Use heavy machinery. ? Drink alcohol. ? Take sleeping pills or medicines that cause drowsiness. ? Make important decisions or sign legal documents. ? Take care of children on your own.  Rest. Eating and drinking  Follow the diet recommended by your health care provider.  If you vomit: ? Drink water, juice, or soup when you can drink without vomiting. ? Make sure you have little or no nausea before eating solid foods. General instructions  Have a responsible adult stay with you until you are awake and alert.  Take over-the-counter and prescription medicines only as told by your health care provider.  If you smoke, do not smoke without supervision.  Keep all follow-up visits as told by your health care provider. This is important. Contact a health care provider if:  You keep feeling nauseous or you keep vomiting.  You feel light-headed.  You develop a rash.  You have a fever. Get help right away if:  You have trouble breathing. This information is not intended to replace advice given to you by your health care provider. Make sure you discuss any questions you have with your health care provider. Document Released: 12/21/2012 Document Revised: 08/05/2015 Document Reviewed: 06/22/2015 Elsevier Interactive Patient Education  [employer].

## 2017-03-19 ENCOUNTER — Ambulatory Visit (HOSPITAL_COMMUNITY): Payer: 59

## 2017-03-19 ENCOUNTER — Other Ambulatory Visit (HOSPITAL_COMMUNITY): Payer: 59

## 2017-03-25 ENCOUNTER — Inpatient Hospital Stay: Payer: BLUE CROSS/BLUE SHIELD | Attending: Hematology and Oncology | Admitting: Hematology and Oncology

## 2017-03-25 ENCOUNTER — Encounter: Payer: Self-pay | Admitting: Hematology and Oncology

## 2017-03-25 VITALS — BP 157/83 | HR 78 | Temp 97.5°F | Resp 18 | Ht 66.0 in | Wt 201.8 lb

## 2017-03-25 DIAGNOSIS — R5381 Other malaise: Secondary | ICD-10-CM

## 2017-03-25 DIAGNOSIS — Z79899 Other long term (current) drug therapy: Secondary | ICD-10-CM | POA: Diagnosis not present

## 2017-03-25 DIAGNOSIS — Z9221 Personal history of antineoplastic chemotherapy: Secondary | ICD-10-CM | POA: Insufficient documentation

## 2017-03-25 DIAGNOSIS — T451X5S Adverse effect of antineoplastic and immunosuppressive drugs, sequela: Secondary | ICD-10-CM | POA: Diagnosis not present

## 2017-03-25 DIAGNOSIS — C541 Malignant neoplasm of endometrium: Secondary | ICD-10-CM

## 2017-03-25 DIAGNOSIS — E669 Obesity, unspecified: Secondary | ICD-10-CM | POA: Diagnosis not present

## 2017-03-25 DIAGNOSIS — Z90710 Acquired absence of both cervix and uterus: Secondary | ICD-10-CM

## 2017-03-25 DIAGNOSIS — G620 Drug-induced polyneuropathy: Secondary | ICD-10-CM | POA: Diagnosis not present

## 2017-03-25 DIAGNOSIS — Z90722 Acquired absence of ovaries, bilateral: Secondary | ICD-10-CM | POA: Insufficient documentation

## 2017-03-25 DIAGNOSIS — T451X5A Adverse effect of antineoplastic and immunosuppressive drugs, initial encounter: Secondary | ICD-10-CM

## 2017-03-25 NOTE — Assessment & Plan Note (Signed)
Overall, her symptoms are improving The patient is reluctant to go on medications I recommend B complex supplement

## 2017-03-25 NOTE — Assessment & Plan Note (Signed)
She has physical deconditioning since completion of chemotherapy I recommend referral to cancer rehab program We discussed increase physical activity and possible enrollment with the [place] program with the [facility]. We discussed importance of vitamin D supplementation. We also discussed importance of keep up to date with age appropriate preventive screening programs and appropriate vaccinations

## 2017-03-25 NOTE — Assessment & Plan Note (Signed)
The patient is obese We discussed the association of endometrial cancer and obesity We discussed weight loss strategies

## 2017-03-25 NOTE — Progress Notes (Signed)
[facility] OFFICE PROGRESS NOTE  Patient Care Team: Haughton, Cardwell as PCP - General (Family Medicine)  SUMMARY OF ONCOLOGIC HISTORY:   Endometrial cancer ([place])   08/11/2016 Procedure    Procedure: hysteroscopy, polypectomy and curettage with MyoSure       08/13/2016 Pathology Results    Endometrium, curettage, and endometrial polyp - ENDOMETRIAL CARCINOMA, SEE COMMENT. 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 There is a small focus (5%) of clear cell carcinoma and a few foci suspicious for serous carcinoma.       08/27/2016 Imaging    Ct scan of abdomen 1. No findings of adenopathy or metastatic disease in the abdomen or pelvis. 2. 2 by 3 mm peripheral right lower lobe pulmonary nodule on image 11/7. Highly likely to be benign based on morphology      09/01/2016 Pathology Results    1. Lymph node, sentinel, biopsy, right external iliac - ONE BENIGN LYMPH NODE (0/1). 2. Lymph node, sentinel, biopsy, right obturator - ONE BENIGN LYMPH NODE (0/1). 3. Lymph node, sentinel, biopsy, distal left external iliac - ONE BENIGN LYMPH NODE (0/1). 4. Lymph node, sentinel, biopsy, left proximal external iliac - ONE BENIGN LYMPH NODE (0/1). 5. Uterus +/- tubes/ovaries, neoplastic, cervix - ENDOMETRIAL ADENOCARCINOMA. - LUMINAL INVOLVEMENT OF RIGHT AND LEFT FALLOPIAN TUBES. - CERVIX AND BILATERAL OVARIES FREE OF TUMOR. - SEE ONCOLOGY TABLE AND COMMENT. Microscopic Comment 1. - 4. Immunohistochemistry for cytokeratin AE1/AE3 is performed on all of the lymph nodes (parts 1-4) and no positivity is identified. 5. ONCOLOGY TABLE-UTERUS, CARCINOMA OR CARCINOSARCOMA  Specimen: Uterus with bilateral fallopian tubes and ovaries with right and left pelvic lymph nodes Procedure: Hysterectomy with bilateral salpingo-oophorectomy Lymph node sampling performed: Yes Specimen integrity:  Intact Maximum tumor size: 1.8 cm Histologic type: Endometrial adenocarcinoma, see comment. Grade: II Myometrial invasion: 0 cm where myometrium is 1.1 cm in thickness Cervical stromal involvement: No Extent of involvement of other organs: Luminal involvement of right and left fallopian tubes, see comment. Lymph - vascular invasion: Not identified  Peritoneal washings: N/A Lymph nodes: Examined: 4 Sentinel 0 Non-sentinel 4 Total Lymph nodes with metastasis: 0 Isolated tumor cells (< 0.2 mm): 0 Micrometastasis: (> 0.2 mm and < 2.0 mm): 0 Macrometastasis: (> 2.0 mm): 0 Extracapsular extension: N/A Pelvic lymph nodes: 0 involved of 4 lymph nodes. Para-aortic lymph nodes: 0 involved of 0 lymph nodes. Other (specify involvement and site): N/A TNM code: pT3a, pN0 FIGO Stage (based on pathologic findings, needs clinical correlation): III-A  Comment: The endometrial tumor is a moderately differentiated mostly endometrioid adenocarcinoma with rare foci of squamous differentiation and a rare microscopic focus consistent with serous differentiation. There is also a microscopic focus with chondroid stroma which is associated with an adjacent microscopic serous component. This finding suggests a microscopic stromal component which is less than 1 mm in greatest dimension. In addition, there are luminal foci of carcinoma within the right and fallopian tubes and the right fallopian tube lumen involvement shows a focus of similar appearing chondroid stroma with adjacent microscopic focus of serous carcinoma.      09/01/2016 Surgery    Surgeon: Donaciano Eva   Operation: Robotic-assisted laparoscopic total hysterectomy with bilateral salpingoophorectomy, Sentinel lymph node biopsy  Operative Findings:  : 6cm normal appearing uterus, normal tubes and ovaries, no suspicious lymph nodes, no apparent peritoneal disease.       11/10/2016 Procedure    Successful placement  of a right IJ  approach Power Port with ultrasound and fluoroscopic guidance. The catheter is ready for use.      11/12/2016 - 02/23/2017 Chemotherapy    She received carboplatin & Taxol x 6 cycles      12/02/2016 Adverse Reaction    She had severe neuropathy from cycle 1 of treatment. Dose of chemo reduced from cycle 2 onwards      03/11/2017 Procedure    Successful removal of implanted Port-A-Cath.       INTERVAL HISTORY: Please see below for problem oriented charting. She returns for further follow-up She has completed chemotherapy last month She is still experiencing peripheral neuropathy but overall better She complained of some diffuse joint pain Denies abdominal bloating, nausea or changes in bowel habits  REVIEW OF SYSTEMS:   Constitutional: Denies fevers, chills or abnormal weight loss Eyes: Denies blurriness of vision Ears, nose, mouth, throat, and face: Denies mucositis or sore throat Respiratory: Denies cough, dyspnea or wheezes Cardiovascular: Denies palpitation, chest discomfort or lower extremity swelling Gastrointestinal:  Denies nausea, heartburn or change in bowel habits Skin: Denies abnormal skin rashes Lymphatics: Denies new lymphadenopathy or easy bruising Neurological:Denies numbness, tingling or new weaknesses Behavioral/Psych: Mood is stable, no new changes  All other systems were reviewed with the patient and are negative.  I have reviewed the past medical history, past surgical history, social history and family history with the patient and they are unchanged from previous note.  ALLERGIES:  is allergic to latex and tamiflu  [oseltamivir phosphate].  MEDICATIONS:  Current Outpatient Medications  Medication Sig Dispense Refill  . Ascorbic Acid (VITAMIN C PO) Take 1 tablet by mouth 3 (three) times daily as needed (for immune health support or cold symptoms.).    Marland Kitchen b complex vitamins tablet Take 1 tablet by mouth daily.    Marland Kitchen morphine (MSIR) 15 MG tablet Take 1  tablet (15 mg total) by mouth every 4 (four) hours as needed for severe pain. 90 tablet 0  . PREBIOTIC PRODUCT PO Take 1 capsule by mouth daily.    . Probiotic Product (PROBIOTIC DAILY PO) Take 1 capsule by mouth daily.     No current facility-administered medications for this visit.     PHYSICAL EXAMINATION: ECOG PERFORMANCE STATUS: 1 - Symptomatic but completely ambulatory  Vitals:   03/25/17 1234  BP: (!) 157/83  Pulse: 78  Resp: 18  Temp: (!) 97.5 F (36.4 C)  SpO2: 99%   Filed Weights   03/25/17 1234  Weight: 201 lb 12.8 oz (91.5 kg)    GENERAL:alert, no distress and comfortable SKIN: skin color, texture, turgor are normal, no rashes or significant lesions EYES: normal, Conjunctiva are pink and non-injected, sclera clear Musculoskeletal:no cyanosis of digits and no clubbing  NEURO: alert & oriented x 3 with fluent speech, no focal motor/sensory deficits  LABORATORY DATA:  I have reviewed the data as listed    Component Value Date/Time   NA 137 03/11/2017 0716   NA 140 02/23/2017 1147   K 3.8 03/11/2017 0716   K 4.0 02/23/2017 1147   CL 103 03/11/2017 0716   CO2 24 03/11/2017 0716   CO2 19 (L) 02/23/2017 1147   GLUCOSE 99 03/11/2017 0716   GLUCOSE 112 02/23/2017 1147   BUN 12 03/11/2017 0716   BUN 15.3 02/23/2017 1147   CREATININE 0.74 03/11/2017 0716   CREATININE 0.8 02/23/2017 1147   CALCIUM 9.2 03/11/2017 0716   CALCIUM 9.7 02/23/2017 1147   PROT 7.0 02/23/2017  1147   ALBUMIN 4.0 02/23/2017 1147   AST 20 02/23/2017 1147   ALT 28 02/23/2017 1147   ALKPHOS 127 02/23/2017 1147   BILITOT 0.31 02/23/2017 1147   GFRNONAA >60 03/11/2017 0716   GFRAA >60 03/11/2017 0716    No results found for: SPEP, UPEP  Lab Results  Component Value Date   WBC 3.4 (L) 03/11/2017   NEUTROABS 5.5 02/23/2017   HGB 10.9 (L) 03/11/2017   HCT 33.3 (L) 03/11/2017   MCV 91.2 03/11/2017   PLT 165 03/11/2017      Chemistry      Component Value Date/Time   NA 137  03/11/2017 0716   NA 140 02/23/2017 1147   K 3.8 03/11/2017 0716   K 4.0 02/23/2017 1147   CL 103 03/11/2017 0716   CO2 24 03/11/2017 0716   CO2 19 (L) 02/23/2017 1147   BUN 12 03/11/2017 0716   BUN 15.3 02/23/2017 1147   CREATININE 0.74 03/11/2017 0716   CREATININE 0.8 02/23/2017 1147      Component Value Date/Time   CALCIUM 9.2 03/11/2017 0716   CALCIUM 9.7 02/23/2017 1147   ALKPHOS 127 02/23/2017 1147   AST 20 02/23/2017 1147   ALT 28 02/23/2017 1147   BILITOT 0.31 02/23/2017 1147       RADIOGRAPHIC STUDIES: I have personally reviewed the radiological images as listed and agreed with the findings in the report. Ir Removal [employer] W/o [place] Mod Sed  Result Date: 03/11/2017 CLINICAL DATA:  Endometrial cancer, completed therapy EXAM: REMOVAL OF IMPLANTED TUNNELED PORT-A-CATH MEDICATIONS: Ancef 2 g. The antibiotic was administered within 1 hour prior to the start of the procedure. ANESTHESIA/SEDATION: Moderate (conscious) sedation was employed during this procedure. A total of Versed 1.0 mg and Fentanyl 75 mcg was administered intravenously. Moderate Sedation Time: 18 minutes. The patient's level of consciousness and vital signs were monitored continuously by radiology nursing throughout the procedure under my direct supervision. FLUOROSCOPY TIME:  None. PROCEDURE: Informed written consent was obtained from the patient after a discussion of the risk, benefits and alternatives to the procedure. The patient was positioned supine on the fluoroscopy table and the right chest Port-A-Cath site was prepped with chlorhexidine. A sterile gown and gloves were worn during the procedure. Local anesthesia was provided with 1% lidocaine with epinephrine. A timeout was performed prior to the initiation of the procedure. An incision was made overlying the Port-A-Cath with a #15 scalpel. Utilizing sharp and blunt dissection, the Port-A-Cath was removed completely. The pocked was irrigated with  sterile saline. Wound closure was performed with subcutaneous 2-0 Vicryl, subcuticular 4-0 Vicryl, Dermabond and Steri-Strips. A dressing was placed. The patient tolerated the procedure well without immediate post procedural complication. FINDINGS: Successful removal of implant Port-A-Cath without immediate post procedural complication. IMPRESSION: Successful removal of implanted Port-A-Cath. Electronically Signed   By: Jerilynn Mages.  Shick M.D.   On: 03/11/2017 10:02    ASSESSMENT & PLAN:  Endometrial cancer ([place]) Most of the side effects from chemotherapy are resolving I recommend return visit to see Dr. Denman George for further follow-up history and physical examination in approximately 3 months   Peripheral neuropathy due to chemotherapy ([place]) Overall, her symptoms are improving The patient is reluctant to go on medications I recommend B complex supplement  Physical deconditioning She has physical deconditioning since completion of chemotherapy I recommend referral to cancer rehab program We discussed increase physical activity and possible enrollment with the [place] program with the [facility]. We discussed importance of  vitamin D supplementation. We also discussed importance of keep up to date with age appropriate preventive screening programs and appropriate vaccinations   Obesity (BMI 30.0-34.9) The patient is obese We discussed the association of endometrial cancer and obesity We discussed weight loss strategies   Orders Placed This Encounter  Procedures  . Ambulatory Referral to Physical Therapy    Referral Priority:   Routine    Referral Type:   Physical Medicine    Referral Reason:   Specialty Services Required    Requested Specialty:   Physical Therapy    Number of Visits Requested:   1   All questions were answered. The patient knows to call the clinic with any problems, questions or concerns. No barriers to learning was detected. I spent 15 minutes counseling the patient face to  face. The total time spent in the appointment was 20 minutes and more than 50% was on counseling and review of test results     Heath Lark, MD 03/25/2017 1:19 PM

## 2017-03-25 NOTE — Assessment & Plan Note (Signed)
Most of the side effects from chemotherapy are resolving I recommend return visit to see Dr. Denman George for further follow-up history and physical examination in approximately 3 months

## 2017-03-26 ENCOUNTER — Telehealth: Payer: Self-pay | Admitting: *Deleted

## 2017-03-26 NOTE — Telephone Encounter (Signed)
Called patient and scheduled appt for three month follow up. Appt for April 10th at 2:15pm

## 2017-03-29 ENCOUNTER — Ambulatory Visit: Payer: BLUE CROSS/BLUE SHIELD | Admitting: Physical Therapy

## 2017-03-29 ENCOUNTER — Encounter: Payer: Self-pay | Admitting: Physical Therapy

## 2017-03-29 ENCOUNTER — Other Ambulatory Visit: Payer: Self-pay

## 2017-03-29 ENCOUNTER — Ambulatory Visit: Payer: BLUE CROSS/BLUE SHIELD | Attending: Hematology and Oncology | Admitting: Physical Therapy

## 2017-03-29 DIAGNOSIS — M62838 Other muscle spasm: Secondary | ICD-10-CM | POA: Diagnosis present

## 2017-03-29 DIAGNOSIS — R293 Abnormal posture: Secondary | ICD-10-CM | POA: Insufficient documentation

## 2017-03-29 DIAGNOSIS — R262 Difficulty in walking, not elsewhere classified: Secondary | ICD-10-CM | POA: Insufficient documentation

## 2017-03-29 DIAGNOSIS — R279 Unspecified lack of coordination: Secondary | ICD-10-CM | POA: Diagnosis present

## 2017-03-29 DIAGNOSIS — M25562 Pain in left knee: Secondary | ICD-10-CM | POA: Insufficient documentation

## 2017-03-29 DIAGNOSIS — M6281 Muscle weakness (generalized): Secondary | ICD-10-CM | POA: Insufficient documentation

## 2017-03-29 DIAGNOSIS — M25511 Pain in right shoulder: Secondary | ICD-10-CM | POA: Insufficient documentation

## 2017-03-29 DIAGNOSIS — G8929 Other chronic pain: Secondary | ICD-10-CM | POA: Insufficient documentation

## 2017-03-29 NOTE — Therapy (Signed)
[place], [place], [address] Phone: [phone]   Fax:  [phone]  Physical Therapy Evaluation  Patient Details  Name: Danielle Guzman MRN: [id number] Date of Birth: 10-13-1965 Referring Provider: Dr Alvy Bimler   Encounter Date: 03/29/2017  PT End of Session - 03/29/17 1448    Visit Number  1    Number of Visits  17    Date for PT Re-Evaluation  05/24/17    PT Start Time  0240    PT Stop Time  1445    PT Time Calculation (min)  53 min    Activity Tolerance  Patient tolerated treatment well    Behavior During Therapy  [facility] for tasks assessed/performed       Past Medical History:  Diagnosis Date  . Cancer ([place]) 2018   endometrial   . Headache    hx migraines yrs ago  . Malaria as child  . Miscarriage    x 2  . Pneumonia yrs ago  . 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 DENTAL SURGERY     gum graft 20 yrs ago and again on 08/26/16  . DILATATION & CURETTAGE/HYSTEROSCOPY WITH MYOSURE N/A 08/11/2016   Procedure: DILATATION & CURETTAGE/HYSTEROSCOPY WITH MYOSURE;  Surgeon: Jerelyn Charles, MD;  Location: [facility];  Service: Gynecology;  Laterality: N/A;  polyp removal  . IR FLUORO GUIDE PORT INSERTION RIGHT  11/10/2016  . IR REMOVAL TUN ACCESS W/ PORT W/O FL MOD SED  03/11/2017  . IR US GUIDE VASC ACCESS RIGHT  11/10/2016  . LYMPH NODE BIOPSY N/A 09/01/2016   Procedure: SENTINEL LYMPH NODE BIOPSY;  Surgeon: Everitt Amber, MD;  Location: [facility];  Service: Gynecology;  Laterality: N/A;  . ROBOTIC ASSISTED TOTAL HYSTERECTOMY WITH BILATERAL SALPINGO OOPHERECTOMY Bilateral 09/01/2016   Procedure: ROBOTIC ASSISTED TOTAL HYSTERECTOMY WITH BILATERAL SALPINGO OOPHORECTOMY;  Surgeon: Everitt Amber, MD;  Location: [facility];  Service: Gynecology;  Laterality: Bilateral;  . WISDOM TOOTH EXTRACTION      There were no vitals filed for this  visit.   Subjective Assessment - 03/29/17 1353    Subjective  I am used to walking 2-4 miles a day and now I am out of breath at a 1/4 of a mile. I am having all over weakness and joint pain. I have been getting accupuncture for my neuropathy in my feet and my hands. It helps some but yesterday I had to stay in the bed all day.     Pertinent History  ovarian cancer    Patient Stated Goals  to get strength back and get mobile again and be pain free    Currently in Pain?  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    Multiple Pain Sites  Yes    Pain Score  4  Pain Location  Shoulder    Pain Orientation  Right    Pain Descriptors / Indicators  Dull;Aching    Pain Type  Chronic pain    Pain Onset  More than a month ago    Pain Frequency  Constant    Aggravating Factors   moving shoulder    Pain Relieving Factors  hot tub    Effect of Pain on Daily Activities  limits mobility         [facility] PT Assessment - 03/29/17 0001      Assessment   Medical Diagnosis  ovarian cancer    Referring Provider  Dr Alvy Bimler    Onset Date/Surgical Date  09/01/16    Hand Dominance  Right uses L for eating and sports    Prior Therapy  none      Precautions   Precautions  Other (comment)    Precaution Comments  at risk for LE lymphedema      Restrictions   Weight Bearing Restrictions  No      Balance Screen   Has the patient fallen in the past 6 months  No    Has the patient had a decrease in activity level because of a fear of falling?   No    Is the patient reluctant to leave their home because of a  fear of falling?   No      Home Film/video editor residence    Living Arrangements  Spouse/significant other    Available Help at Discharge  Family    Type of [place] to enter    Entrance Stairs-Number of Steps  6    Entrance Stairs-Rails  Can reach both    Home Layout  Two level    Alternate Level Stairs-Number of Steps  20    Alternate Level Stairs-Rails  Right    Home Equipment  None      Prior Function   Level of Independence  Independent    Vocation  Part time employment    Vocation Requirements  pt is a massage therapist, use proper body mechanics, standing, bending, lifting, computer work    Leisure  pt exercises 5days/wk for 20-30 min - takes walks, gentle yoga, dancing- prior to sugery pt was walking 2-5 miles/day      Cognition   Overall Cognitive Status  Within Functional Limits for tasks assessed      Observation/Other Assessments   Observations  pt reports neuropathy in bilateral feet and hands      Strength   Right Shoulder Flexion  3/5    Right Shoulder ABduction  4/5    Left Shoulder Flexion  3/5    Left Shoulder ABduction  4/5    Right Elbow Flexion  3/5    Right Elbow Extension  3/5    Left Elbow Flexion  4/5    Left Elbow Extension  3/5    Right Hip Flexion  3/5    Right Hip ABduction  3/5    Right Hip ADduction  3/5    Left Hip Flexion  3/5    Left Hip ABduction  3/5    Left Hip ADduction  3/5    Right Knee Flexion  3/5    Right Knee Extension  4/5    Left Knee Flexion  3/5    Left Knee Extension  3/5    Right Ankle Dorsiflexion  3/5  Left Ankle Dorsiflexion  3/5      Standardized Balance Assessment   Standardized Balance Assessment  -- 30 sec sit to stand - 7 reps      Timed Up and Go Test   Normal TUG (seconds)  11        LYMPHEDEMA/ONCOLOGY QUESTIONNAIRE - 03/29/17 1428      Type   Cancer Type  ovarian cancer      Treatment   Active Chemotherapy Treatment  No    Past  Chemotherapy Treatment  Yes    Date  02/22/17    Active Radiation Treatment  No    Past Radiation Treatment  No          Objective measurements completed on examination: See above findings.                PT Short Term Goals - 03/29/17 1456      PT SHORT TERM GOAL #1   Title  Pt will be able to complete 9 sit to stands in 30 sec to decrease risk of falls    Baseline  7    Time  4    Period  Weeks    Status  New    Target Date  04/26/17      PT SHORT TERM GOAL #2   Title  Pt will demonstrate 3+/5 hip flexor strength to decrease risk of falls when ambulating    Baseline  3/5    Time  4    Period  Weeks    Status  New    Target Date  04/26/17      PT SHORT TERM GOAL #3   Title  Pt will report a 25% improvement in left knee pain to allow for improved mobility    Time  4    Period  Weeks    Status  New    Target Date  04/26/17        PT Long Term Goals - 03/29/17 1458      PT LONG TERM GOAL #1   Title  Pt will be independent in a home exercise program for continued strengthening and stretching    Time  8    Period  Weeks    Status  New    Target Date  05/24/17      PT LONG TERM GOAL #2   Title  Pt will demonstrate 4/5 knee flexor strength to decrease risk of falls    Baseline  3/5    Time  8    Period  Weeks    Status  New    Target Date  05/24/17      PT LONG TERM GOAL #3   Title  Pt will report 75% improvement in left knee pain to allow pt to complete her job using proper body mechanincs    Time  8    Period  Weeks    Status  New    Target Date  05/24/17      PT LONG TERM GOAL #4   Title  Pt will demonstrate 4/5 bilateral ankle dorsiflexor strength to decrease risk of falls    Baseline  3/5    Time  8    Period  Weeks    Status  New    Target Date  05/24/17      PT LONG TERM GOAL #5   Title  Pt will be able to complete 13 sit to stands in 30 seconds to decrease risk  of falls    Baseline  7    Time  8    Period  Weeks    Status   New    Target Date  05/24/17             Plan - 03/29/17 1449    Clinical Impression Statement  Pt presents to PT following surgery and chemotherapy for treatment of ovarian cancer. Pt has bilateral LE weakness which are grossly 3/5 throughout. Her UEs are also grossly 3/5. 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 Pt is now very limited due to joint pain especially in her left knee and right shoulder. Crepitus is present in bilateral knees. She has increased left knee pain with extension, squatting and kneeling that presented itself while she was undergoing chemotherapy. She completed chemotherapy a month ago. She is having LE swelling but it is most likely a result of chemotherapy and steroids and should resolve. Pt was only able to complete 7 sit to stands in 30 seconds placing her at a high fall risk especially for her age group. She would benefit from skilled PT services to increase bilateral UE and LE strength, decrease knee and hip pain and improve overall mobility.     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, lift, and bend, neuropathy in hands and feet    Clinical Presentation  Evolving    Clinical Presentation due to:  pt is still having effects from chemotherapy    Clinical Decision Making  Moderate    Rehab Potential  Good    Clinical Impairments Affecting Rehab Potential  pt with increased left knee and right shoulder pain    PT Frequency  2x / week    PT Duration  8 weeks    PT Treatment/Interventions  ADLs/Self Care Home Management;Electrical Stimulation;Iontophoresis 4mg /ml Dexamethasone;Balance training;Therapeutic exercise;Neuromuscular re-education;Manual techniques    PT Next Visit Plan  begin supine strengthening exercises, 4 square step test    Recommended Other Services  Pt would benefit from pelvic floor PT with Malachy Mood at  [place] location    Consulted and Agree with Plan of Care  Patient       Patient will benefit from skilled therapeutic intervention in order to improve the following deficits and impairments:  Decreased balance, Decreased endurance, Decreased mobility, Difficulty walking, Impaired sensation, Increased edema, Decreased strength, Postural dysfunction, Pain  Visit Diagnosis: Chronic pain of left knee - Plan: PT plan of care cert/re-cert  Chronic right shoulder pain - Plan: PT plan of care cert/re-cert  Muscle weakness (generalized) - Plan: PT plan of care cert/re-cert  Difficulty in walking, not elsewhere classified - Plan: PT plan of care cert/re-cert  Abnormal posture - Plan: PT plan of care cert/re-cert     Problem List Patient Active Problem List   Diagnosis Date Noted  . Physical deconditioning 03/25/2017  . Obesity (BMI 30.0-34.9) 03/25/2017  . Drug-induced hyperglycemia 12/23/2016  . Port catheter in place 12/02/2016  . Dysuria 12/02/2016  . Peripheral neuropathy due to chemotherapy ([place]) 12/02/2016  . Acute rhinitis 11/04/2016  . Goals of care, counseling/discussion 11/04/2016  . 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 Endometrial cancer ([place]) 08/24/2016    Allyson Sabal [facility] 03/29/2017, 3:02 PM  [place], [place], [address] Phone: [phone]  Fax:  [phone]  Name: Danielle Guzman MRN: [id number] Date of Birth: [birth year]  Manus Gunning, PT 03/29/17 3:02 PM

## 2017-03-31 ENCOUNTER — Encounter: Payer: Self-pay | Admitting: Physical Therapy

## 2017-03-31 ENCOUNTER — Other Ambulatory Visit: Payer: Self-pay | Admitting: *Deleted

## 2017-03-31 ENCOUNTER — Ambulatory Visit: Payer: BLUE CROSS/BLUE SHIELD | Admitting: Physical Therapy

## 2017-03-31 DIAGNOSIS — M6281 Muscle weakness (generalized): Secondary | ICD-10-CM

## 2017-03-31 DIAGNOSIS — M25562 Pain in left knee: Secondary | ICD-10-CM | POA: Diagnosis not present

## 2017-03-31 DIAGNOSIS — R262 Difficulty in walking, not elsewhere classified: Secondary | ICD-10-CM

## 2017-03-31 DIAGNOSIS — G8929 Other chronic pain: Secondary | ICD-10-CM

## 2017-03-31 DIAGNOSIS — C7982 Secondary malignant neoplasm of genital organs: Secondary | ICD-10-CM

## 2017-03-31 NOTE — Patient Instructions (Signed)
Bridge    Lie back, legs bent. Inhale, pressing hips up. Keeping ribs in, lengthen lower back. Exhale, rolling down along spine from top. Repeat _10___ times. Do __1__ sessions per day.  http://pm.exer.us/55   Copyright  VHI. All rights reserved.  Short Arc [employer] a large can or rolled towel under leg. Straighten knee and leg. Hold __3__ seconds. Repeat with other leg. Repeat _10___ times. Do _1__ sessions per day.  http://gt2.exer.us/366   Copyright  VHI. All rights reserved.  Gluteal Sets    Squeeze pelvic floor and hold. Tighten bottom. Hold for __5_ seconds. Relax for _3__ seconds. Repeat _10__ times. Do __1_ times a day.   Copyright  VHI. All rights reserved.  Quad Set    With other leg bent, foot flat, slowly tighten muscles on thigh of straight leg while counting out loud to __5__. Repeat with other leg. Repeat _10___ times. Do __1__ sessions per day.  http://gt2.exer.us/276   Copyright  VHI. All rights reserved.  Straight Leg Raise    Bend one leg. Raise other leg __10-12__ inches with knee locked. Exhale and tighten thigh muscles while raising leg. Repeat with other leg. Repeat _10___ times. Do __1__ sessions per day.  http://gt2.exer.us/270   Copyright  VHI. All rights reserved.

## 2017-03-31 NOTE — Therapy (Signed)
[place], [place], [address] Phone: [phone]   Fax:  [phone]  Physical Therapy Treatment  Patient Details  Name: Danielle Guzman MRN: [id number] Date of Birth: [birth year] Referring Provider: Dr Alvy Bimler   Encounter Date: 03/31/2017  PT End of Session - 03/31/17 1520    Visit Number  2    Number of Visits  17    Date for PT Re-Evaluation  05/24/17    PT Start Time  1435    PT Stop Time  1520    PT Time Calculation (min)  45 min    Activity Tolerance  Patient tolerated treatment well    Behavior During Therapy  [facility] for tasks assessed/performed       Past Medical History:  Diagnosis Date  . Cancer ([place]) 2018   endometrial   . Headache    hx migraines yrs ago  . Malaria as child  . Miscarriage    x 2  . Pneumonia yrs ago  . 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 DENTAL SURGERY     gum graft 20 yrs ago and again on 08/26/16  . DILATATION & CURETTAGE/HYSTEROSCOPY WITH MYOSURE N/A 08/11/2016   Procedure: DILATATION & CURETTAGE/HYSTEROSCOPY WITH MYOSURE;  Surgeon: Jerelyn Charles, MD;  Location: [facility];  Service: Gynecology;  Laterality: N/A;  polyp removal  . IR FLUORO GUIDE PORT INSERTION RIGHT  11/10/2016  . IR REMOVAL TUN ACCESS W/ PORT W/O FL MOD SED  03/11/2017  . IR US GUIDE VASC ACCESS RIGHT  11/10/2016  . LYMPH NODE BIOPSY N/A 09/01/2016   Procedure: SENTINEL LYMPH NODE BIOPSY;  Surgeon: Everitt Amber, MD;  Location: [facility];  Service: Gynecology;  Laterality: N/A;  . ROBOTIC ASSISTED TOTAL HYSTERECTOMY WITH BILATERAL SALPINGO OOPHERECTOMY Bilateral 09/01/2016   Procedure: ROBOTIC ASSISTED TOTAL HYSTERECTOMY WITH BILATERAL SALPINGO OOPHORECTOMY;  Surgeon: Everitt Amber, MD;  Location: [facility];  Service: Gynecology;  Laterality: Bilateral;  . WISDOM TOOTH EXTRACTION      There were no vitals filed for this  visit.  Subjective Assessment - 03/31/17 1436    Subjective  I am feeling mostly good today but I am achy.     Pertinent History  ovarian cancer with complete hysterectomy 09/01/16 and SLNB, completed chemotherapy on 02/23/17    Patient Stated Goals  to get strength back and get mobile again and be pain free    Currently in Pain?  Yes    Pain Score  5     Pain Orientation  Left    Pain Descriptors / Indicators  Aching;Dull    Pain Onset  More than a month ago    Pain Score  4    Pain Location  Shoulder    Pain Orientation  Right    Pain Descriptors / Indicators  Aching;Dull    Pain Type  Chronic pain    Pain Onset  More than a month ago                      [facility] Adult PT Treatment/Exercise - 03/31/17 0001      Lumbar Exercises: Supine   Glut Set  10 reps;5 seconds    Clam  10 reps    Bent Knee Raise  10 reps      Knee/Hip Exercises: Supine   Quad Sets  Both;1 set;10  reps;Strengthening with 5 sec holds    Short Arc [employer]  Strengthening;1 set;Both;10 reps with 3 sec holds    Heel Slides  Strengthening;Both;1 set;10 reps    Hip Adduction Isometric  Strengthening;Both;1 set;10 reps with 5 sec holds with ball between knees    Bridges  Strengthening;1 set;10 reps with 3 sec holds    Straight Leg Raises  Strengthening;Both;10 reps               PT Short Term Goals - 03/29/17 1456      PT SHORT TERM GOAL #1   Title  Pt will be able to complete 9 sit to stands in 30 sec to decrease risk of falls    Baseline  7    Time  4    Period  Weeks    Status  New    Target Date  04/26/17      PT SHORT TERM GOAL #2   Title  Pt will demonstrate 3+/5 hip flexor strength to decrease risk of falls when ambulating    Baseline  3/5    Time  4    Period  Weeks    Status  New    Target Date  04/26/17      PT SHORT TERM GOAL #3   Title  Pt will report a 25% improvement in left knee pain to allow for improved mobility    Time  4    Period  Weeks    Status  New     Target Date  04/26/17        PT Long Term Goals - 03/29/17 1458      PT LONG TERM GOAL #1   Title  Pt will be independent in a home exercise program for continued strengthening and stretching    Time  8    Period  Weeks    Status  New    Target Date  05/24/17      PT LONG TERM GOAL #2   Title  Pt will demonstrate 4/5 knee flexor strength to decrease risk of falls    Baseline  3/5    Time  8    Period  Weeks    Status  New    Target Date  05/24/17      PT LONG TERM GOAL #3   Title  Pt will report 75% improvement in left knee pain to allow pt to complete her job using proper body mechanincs    Time  8    Period  Weeks    Status  New    Target Date  05/24/17      PT LONG TERM GOAL #4   Title  Pt will demonstrate 4/5 bilateral ankle dorsiflexor strength to decrease risk of falls    Baseline  3/5    Time  8    Period  Weeks    Status  New    Target Date  05/24/17      PT LONG TERM GOAL #5   Title  Pt will be able to complete 13 sit to stands in 30 seconds to decrease risk of falls    Baseline  7    Time  8    Period  Weeks    Status  New    Target Date  05/24/17            Plan - 03/31/17 1520    Clinical Impression Statement  Began supine strengthening exercises today. Pt felt very  fatigued with exercises but was able to complete all of them. Added to HEP and instructed pt to complete once a day.     Rehab Potential  Good    Clinical Impairments Affecting Rehab Potential  pt with increased left knee and right shoulder pain    PT Frequency  2x / week    PT Duration  8 weeks    PT Treatment/Interventions  ADLs/Self Care Home Management;Electrical Stimulation;Iontophoresis 4mg /ml Dexamethasone;Balance training;Therapeutic exercise;Neuromuscular re-education;Manual techniques    PT Next Visit Plan  continue supine strengthening exercises, 4 square step test    PT Home Exercise Plan  briding, SLR, quad set, glute set, SAQ, ball squeezes, clam shell with green  theraband    Consulted and Agree with Plan of Care  Patient       Patient will benefit from skilled therapeutic intervention in order to improve the following deficits and impairments:  Decreased balance, Decreased endurance, Decreased mobility, Difficulty walking, Impaired sensation, Increased edema, Decreased strength, Postural dysfunction, Pain  Visit Diagnosis: Chronic pain of left knee  Muscle weakness (generalized)  Difficulty in walking, not elsewhere classified     Problem List Patient Active Problem List   Diagnosis Date Noted  . Physical deconditioning 03/25/2017  . Obesity (BMI 30.0-34.9) 03/25/2017  . Drug-induced hyperglycemia 12/23/2016  . Port catheter in place 12/02/2016  . Dysuria 12/02/2016  . 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 Acute rhinitis 11/04/2016  . Goals of care, counseling/discussion 11/04/2016  . 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 Endometrial cancer ([place]) 08/24/2016    Allyson Sabal [facility] 03/31/2017, 3:22 PM  [place], [place], [address] Phone: [phone]   Fax:  [phone]  Name: NORMAJEAN NASH MRN: [id number] Date of Birth: 1965-10-27  Manus Gunning, PT 03/31/17 3:22 PM

## 2017-04-02 ENCOUNTER — Encounter: Payer: Self-pay | Admitting: Physical Therapy

## 2017-04-02 ENCOUNTER — Ambulatory Visit: Payer: BLUE CROSS/BLUE SHIELD | Admitting: Physical Therapy

## 2017-04-02 DIAGNOSIS — M6281 Muscle weakness (generalized): Secondary | ICD-10-CM

## 2017-04-02 DIAGNOSIS — G8929 Other chronic pain: Secondary | ICD-10-CM

## 2017-04-02 DIAGNOSIS — M25562 Pain in left knee: Principal | ICD-10-CM

## 2017-04-02 DIAGNOSIS — R262 Difficulty in walking, not elsewhere classified: Secondary | ICD-10-CM

## 2017-04-02 NOTE — Therapy (Signed)
[place], [place], [address] Phone: [phone]   Fax:  [phone]  Physical Therapy Treatment  Patient Details  Name: Danielle Guzman MRN: [id number] Date of Birth: [birth year] Referring Provider: Dr Alvy Bimler   Encounter Date: 04/02/2017  PT End of Session - 04/02/17 1202    Visit Number  3    Number of Visits  17    Date for PT Re-Evaluation  05/24/17    PT Start Time  0936    PT Stop Time  1018    PT Time Calculation (min)  42 min    Activity Tolerance  Patient tolerated treatment well    Behavior During Therapy  [facility] for tasks assessed/performed       Past Medical History:  Diagnosis Date  . Cancer ([place]) 2018   endometrial   . Headache    hx migraines yrs ago  . Malaria as child  . Miscarriage    x 2  . Pneumonia yrs ago  . Seizures ([place])    petite mal seizure in high school x 1, none since    Past Surgical History:  Procedure Laterality Date  . DENTAL SURGERY     gum graft 20 yrs ago and again on 08/26/16  . DILATATION & CURETTAGE/HYSTEROSCOPY WITH MYOSURE N/A 08/11/2016   Procedure: DILATATION & CURETTAGE/HYSTEROSCOPY WITH MYOSURE;  Surgeon: Jerelyn Charles, MD;  Location: [facility];  Service: Gynecology;  Laterality: N/A;  polyp removal  . IR FLUORO GUIDE PORT INSERTION RIGHT  11/10/2016  . IR REMOVAL TUN ACCESS W/ PORT W/O FL MOD SED  03/11/2017  . IR US GUIDE VASC ACCESS RIGHT  11/10/2016  . LYMPH NODE BIOPSY N/A 09/01/2016   Procedure: SENTINEL LYMPH NODE BIOPSY;  Surgeon: Everitt Amber, MD;  [facility];  Service: Gynecology;  Laterality: N/A;  . ROBOTIC ASSISTED TOTAL HYSTERECTOMY WITH BILATERAL SALPINGO OOPHERECTOMY Bilateral 09/01/2016   Procedure: ROBOTIC ASSISTED TOTAL HYSTERECTOMY WITH BILATERAL SALPINGO OOPHORECTOMY;  Surgeon: Everitt Amber, MD;  Location: [facility];  Service: Gynecology;  Laterality: Bilateral;  . WISDOM TOOTH EXTRACTION      There were no vitals filed for this  visit.  Subjective Assessment - 04/02/17 0937    Subjective  I felt okay after last session. I did some more exercises that night and I did them yesterday. I did part of them this morning. I went walking and my feet are hurting worse.     Pertinent History  ovarian cancer with complete hysterectomy 09/01/16 and SLNB, completed chemotherapy on 02/23/17    Patient Stated Goals  to get strength back and get mobile again and be pain free    Currently in Pain?  Yes    Pain Score  6     Pain Location  Knee    Pain Orientation  Left    Pain Descriptors / Indicators  Aching;Dull    Pain Type  Chronic pain    Pain Score  4    Pain Location  Shoulder    Pain Orientation  Right    Pain Descriptors / Indicators  Aching;Dull    Pain Type  Chronic pain                      [facility] Adult PT Treatment/Exercise - 04/02/17 0001      Lumbar Exercises: Supine   Glut Set  10 reps;5 seconds    Clam  10 reps with green theraband around knees  Bent Knee Raise  10 reps with 1 lb weights      Knee/Hip Exercises: Aerobic   Recumbent Bike  Level 1 x 5 min      Knee/Hip Exercises: Supine   Quad Sets  Both;1 set;10 reps;Strengthening with 5 sec holds, 1 lb ankle weight    Heel Slides  Strengthening;Both;1 set;10 reps    Hip Adduction Isometric  Strengthening;Both;1 set;10 reps with 5 sec holds with ball between knees    Bridges  Strengthening;1 set;10 reps with 3 sec holds    Straight Leg Raises  Strengthening;Both;10 reps               PT Short Term Goals - 03/29/17 1456      PT SHORT TERM GOAL #1   Title  Pt will be able to complete 9 sit to stands in 30 sec to decrease risk of falls    Baseline  7    Time  4    Period  Weeks    Status  New    Target Date  04/26/17      PT SHORT TERM GOAL #2   Title  Pt will demonstrate 3+/5 hip flexor strength to decrease risk of falls when ambulating    Baseline  3/5    Time  4    Period  Weeks    Status  New    Target Date  04/26/17       PT SHORT TERM GOAL #3   Title  Pt will report a 25% improvement in left knee pain to allow for improved mobility    Time  4    Period  Weeks    Status  New    Target Date  04/26/17        PT Long Term Goals - 03/29/17 1458      PT LONG TERM GOAL #1   Title  Pt will be independent in a home exercise program for continued strengthening and stretching    Time  8    Period  Weeks    Status  New    Target Date  05/24/17      PT LONG TERM GOAL #2   Title  Pt will demonstrate 4/5 knee flexor strength to decrease risk of falls    Baseline  3/5    Time  8    Period  Weeks    Status  New    Target Date  05/24/17      PT LONG TERM GOAL #3   Title  Pt will report 75% improvement in left knee pain to allow pt to complete her job using proper body mechanincs    Time  8    Period  Weeks    Status  New    Target Date  05/24/17      PT LONG TERM GOAL #4   Title  Pt will demonstrate 4/5 bilateral ankle dorsiflexor strength to decrease risk of falls    Baseline  3/5    Time  8    Period  Weeks    Status  New    Target Date  05/24/17      PT LONG TERM GOAL #5   Title  Pt will be able to complete 13 sit to stands in 30 seconds to decrease risk of falls    Baseline  7    Time  8    Period  Weeks    Status  New    Target Date  05/24/17            Plan - 04/02/17 1202    Clinical Impression Statement  Pt did excellent today in therapy. She was able to complete all exercises given last session- some with increased resistance today and also was able to tolerate 5 min on the recumbent bike. She was able to complete exercises at a faster pace today.     Rehab Potential  Good    Clinical Impairments Affecting Rehab Potential  pt with increased left knee and right shoulder pain    PT Frequency  2x / week    PT Duration  8 weeks    PT Next Visit Plan  continue supine strengthening exercises, bike, 4 square step test    PT Home Exercise Plan  briding, SLR, quad set, glute set,  SAQ, ball squeezes, clam shell with green theraband    Consulted and Agree with Plan of Care  Patient       Patient will benefit from skilled therapeutic intervention in order to improve the following deficits and impairments:  Decreased balance, Decreased endurance, Decreased mobility, Difficulty walking, Impaired sensation, Increased edema, Decreased strength, Postural dysfunction, Pain  Visit Diagnosis: Chronic pain of left knee  Muscle weakness (generalized)  Difficulty in walking, not elsewhere classified     Problem List Patient Active Problem List   Diagnosis Date Noted  . Physical deconditioning 03/25/2017  . Obesity (BMI 30.0-34.9) 03/25/2017  . Drug-induced hyperglycemia 12/23/2016  . Port catheter in place 12/02/2016  . Dysuria 12/02/2016  . 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 Acute rhinitis 11/04/2016  . Goals of care, counseling/discussion 11/04/2016  . 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 Endometrial cancer ([place]) 08/24/2016    Allyson Sabal [facility] 04/02/2017, 12:05 PM  [place], [place], [address] Phone: [phone]   Fax:  [phone]  Name: LEVORA WERDEN MRN: [id number] Date of Birth: 1965/09/23  Manus Gunning, PT 04/02/17 12:05 PM

## 2017-04-05 ENCOUNTER — Encounter: Payer: Self-pay | Admitting: Physical Therapy

## 2017-04-05 ENCOUNTER — Ambulatory Visit: Payer: BLUE CROSS/BLUE SHIELD | Admitting: Physical Therapy

## 2017-04-05 DIAGNOSIS — M25562 Pain in left knee: Principal | ICD-10-CM

## 2017-04-05 DIAGNOSIS — M6281 Muscle weakness (generalized): Secondary | ICD-10-CM

## 2017-04-05 DIAGNOSIS — G8929 Other chronic pain: Secondary | ICD-10-CM

## 2017-04-05 DIAGNOSIS — R262 Difficulty in walking, not elsewhere classified: Secondary | ICD-10-CM

## 2017-04-05 NOTE — Therapy (Signed)
[place], [place], [address] Phone: [phone]   Fax:  [phone]  Physical Therapy Treatment  Patient Details  Name: Danielle Guzman MRN: [id number] Date of Birth: [birth year] Referring Provider: Dr Alvy Bimler   Encounter Date: 04/05/2017  PT End of Session - 04/05/17 1704    Visit Number  4    Number of Visits  17    Date for PT Re-Evaluation  05/24/17    PT Start Time  1539 pt arrived late    PT Stop Time  1604    PT Time Calculation (min)  25 min    Activity Tolerance  Patient tolerated treatment well    Behavior During Therapy  [facility] for tasks assessed/performed       Past Medical History:  Diagnosis Date  . Cancer ([place]) 2018   endometrial   . Headache    hx migraines yrs ago  . Malaria as child  . Miscarriage    x 2  . Pneumonia yrs ago  . Seizures ([place])    petite mal seizure in high school x 1, none since    Past Surgical History:  Procedure Laterality Date  . DENTAL SURGERY     gum graft 20 yrs ago and again on 08/26/16  . DILATATION & CURETTAGE/HYSTEROSCOPY WITH MYOSURE N/A 08/11/2016   Procedure: DILATATION & CURETTAGE/HYSTEROSCOPY WITH MYOSURE;  Surgeon: Jerelyn Charles, MD;  Location: [facility];  Service: Gynecology;  Laterality: N/A;  polyp removal  . IR FLUORO GUIDE PORT INSERTION RIGHT  11/10/2016  . IR REMOVAL TUN ACCESS W/ PORT W/O FL MOD SED  03/11/2017  . IR US GUIDE VASC ACCESS RIGHT  11/10/2016  . LYMPH NODE BIOPSY N/A 09/01/2016   Procedure: SENTINEL LYMPH NODE BIOPSY;  Surgeon: Everitt Amber, MD;  [facility];  Service: Gynecology;  Laterality: N/A;  . ROBOTIC ASSISTED TOTAL HYSTERECTOMY WITH BILATERAL SALPINGO OOPHERECTOMY Bilateral 09/01/2016   Procedure: ROBOTIC ASSISTED TOTAL HYSTERECTOMY WITH BILATERAL SALPINGO OOPHORECTOMY;  Surgeon: Everitt Amber, MD;  [facility]: [facility];  Service: Gynecology;  Laterality: Bilateral;  . WISDOM TOOTH EXTRACTION      There were no vitals  filed for this visit.  Subjective Assessment - 04/05/17 1539    Subjective  I did a lot of work with the massage roller around my knee and that seemed to help. I was a little sore after time.     Pertinent History  ovarian cancer with complete hysterectomy 09/01/16 and SLNB, completed chemotherapy on 02/23/17    Patient Stated Goals  to get strength back and get mobile again and be pain free    Currently in Pain?  Yes    Pain Score  5     Pain Location  Knee    Pain Orientation  Left    Pain Descriptors / Indicators  Aching;Dull    Multiple Pain Sites  Yes    Pain Score  4    Pain Location  Shoulder    Pain Orientation  Right    Pain Descriptors / Indicators  Aching;Dull                      [facility] Adult PT Treatment/Exercise - 04/05/17 0001      Lumbar Exercises: Supine   Glut Set  10 reps;5 seconds    Bent Knee Raise  10 reps with 1 lb weights      Knee/Hip Exercises: Supine   Quad Sets  Both;1 set;10  reps;Strengthening with 5 sec holds    Heel Slides  Strengthening;Both;1 set;10 reps    Bridges  Strengthening;1 set;10 reps with 3 sec holds    Straight Leg Raises  Strengthening;Both;10 reps    Knee Extension  Strengthening;Both;1 set;10 reps               PT Short Term Goals - 03/29/17 1456      PT SHORT TERM GOAL #1   Title  Pt will be able to complete 9 sit to stands in 30 sec to decrease risk of falls    Baseline  7    Time  4    Period  Weeks    Status  New    Target Date  04/26/17      PT SHORT TERM GOAL #2   Title  Pt will demonstrate 3+/5 hip flexor strength to decrease risk of falls when ambulating    Baseline  3/5    Time  4    Period  Weeks    Status  New    Target Date  04/26/17      PT SHORT TERM GOAL #3   Title  Pt will report a 25% improvement in left knee pain to allow for improved mobility    Time  4    Period  Weeks    Status  New    Target Date  04/26/17        PT Long Term Goals - 03/29/17 1458      PT LONG TERM  GOAL #1   Title  Pt will be independent in a home exercise program for continued strengthening and stretching    Time  8    Period  Weeks    Status  New    Target Date  05/24/17      PT LONG TERM GOAL #2   Title  Pt will demonstrate 4/5 knee flexor strength to decrease risk of falls    Baseline  3/5    Time  8    Period  Weeks    Status  New    Target Date  05/24/17      PT LONG TERM GOAL #3   Title  Pt will report 75% improvement in left knee pain to allow pt to complete her job using proper body mechanincs    Time  8    Period  Weeks    Status  New    Target Date  05/24/17      PT LONG TERM GOAL #4   Title  Pt will demonstrate 4/5 bilateral ankle dorsiflexor strength to decrease risk of falls    Baseline  3/5    Time  8    Period  Weeks    Status  New    Target Date  05/24/17      PT LONG TERM GOAL #5   Title  Pt will be able to complete 13 sit to stands in 30 seconds to decrease risk of falls    Baseline  7    Time  8    Period  Weeks    Status  New    Target Date  05/24/17            Plan - 04/05/17 1704    Clinical Impression Statement  Pt was late for her appointment today so she had a shortened session. Continued to focus on supine strengthening exercises today. She did have some soreness after last session but states overall  her pain is better.     Rehab Potential  Good    Clinical Impairments Affecting Rehab Potential  pt with increased left knee and right shoulder pain    PT Frequency  2x / week    PT Duration  8 weeks    PT Treatment/Interventions  ADLs/Self Care Home Management;Electrical Stimulation;Iontophoresis 4mg /ml Dexamethasone;Balance training;Therapeutic exercise;Neuromuscular re-education;Manual techniques    PT Next Visit Plan  continue supine strengthening exercises, bike, 4 square step test    PT Home Exercise Plan  briding, SLR, quad set, glute set, SAQ, ball squeezes, clam shell with green theraband    Consulted and Agree with Plan of  Care  Patient       Patient will benefit from skilled therapeutic intervention in order to improve the following deficits and impairments:  Decreased balance, Decreased endurance, Decreased mobility, Difficulty walking, Impaired sensation, Increased edema, Decreased strength, Postural dysfunction, Pain  Visit Diagnosis: Chronic pain of left knee  Muscle weakness (generalized)  Difficulty in walking, not elsewhere classified     Problem List Patient Active Problem List   Diagnosis Date Noted  . Physical deconditioning 03/25/2017  . Obesity (BMI 30.0-34.9) 03/25/2017  . Drug-induced hyperglycemia 12/23/2016  . Port catheter in place 12/02/2016  . Dysuria 12/02/2016  . 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 Acute rhinitis 11/04/2016  . Goals of care, counseling/discussion 11/04/2016  . 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 Endometrial cancer ([place]) 08/24/2016    Allyson Sabal [facility] 04/05/2017, 5:09 PM  [place], [place], [address] Phone: [phone]   Fax:  [phone]  Name: AMIRACLE NEISES MRN: [id number] Date of Birth: 1965-08-22  Manus Gunning, PT 04/05/17 5:09 PM

## 2017-04-06 ENCOUNTER — Inpatient Hospital Stay: Payer: BLUE CROSS/BLUE SHIELD

## 2017-04-06 NOTE — Progress Notes (Signed)
Nutrition Assessment   Reason for Assessment:   Healthy eating and reaching weight loss goals  ASSESSMENT:  52 year old female with endometrial cancer followed by Dr. Alvy Bimler.  Patient has completed chemotherapy treatment in December 2018.  Past medical history of hysterectomy on 08/2016, peripheral neuropathy from chemotherapy.  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  Patient currently attending physical therapy to improve strength following treatment.    Met with patient in clinic this am.  Patient tearful during visit.  Reports that she is interested in RD reviewing diet and offer suggestions on improving intake for weight loss and overall health.  Also interested in having allergy testing done to see if certain foods are contributing to joint pain, inflammatory response. Typical day is eggs with vegetables or protein from night before and vegetables or smoothie with fruit, vegetable coconut water and yogurt. Lunch is vegetables and nuts or chicken salad wrap with vegetables. Supper is protein (venison, chicken, fish) with vegetables (mainly non starchy), occasionally brown rice and potatoes.  Reports that she eats 1/2 avocado daily.  Does not fry foods but mainly steams, grills, bakes sautes foods in butter or olive oil.  Prepares foods at home and rarely eats out.  Drinks water, unsweet tea, glass of wine.    Patient reports that fasting has worked for her in the past to loose weight.  Has noticed that soy milk causes headaches and white potato increases joint pain but can tolerate sweet potatoes.  Patient is currently doing acupuncture and seeing a chiropracter  Nutrition Focused Physical Exam: deferred  Medications: Vit C, B complex, prebiotic  Labs: reviewed  Anthropometrics:   Height: 66 inches Weight: 204 lb 12.8 oz 03/25/2017 BMI: 32   INTERVENTION:   Discussed current recommendations for nutrition and  cancer recurrence (ie plant based diet).   Discouraged use of fasting for weight loss especially with history of eating disorder.   Encouraged patient to keep food diary of foods that trigger pain or discomfort. Made referral to integrative nutrition, RD in the area for further suggestions regarding food sensitivity.     MONITORING, EVALUATION, GOAL: No further intervention at this time needed.   NEXT VISIT: no further intervention at this time  Jeaneen Cala B. Zenia Resides, [place], [place] Registered Dietitian [phone] (pager)

## 2017-04-08 ENCOUNTER — Ambulatory Visit: Payer: BLUE CROSS/BLUE SHIELD | Admitting: Physical Therapy

## 2017-04-08 ENCOUNTER — Encounter: Payer: Self-pay | Admitting: Physical Therapy

## 2017-04-08 ENCOUNTER — Ambulatory Visit: Payer: BLUE CROSS/BLUE SHIELD

## 2017-04-08 ENCOUNTER — Other Ambulatory Visit: Payer: Self-pay

## 2017-04-08 DIAGNOSIS — R262 Difficulty in walking, not elsewhere classified: Secondary | ICD-10-CM

## 2017-04-08 DIAGNOSIS — M25562 Pain in left knee: Secondary | ICD-10-CM | POA: Diagnosis not present

## 2017-04-08 DIAGNOSIS — M62838 Other muscle spasm: Secondary | ICD-10-CM

## 2017-04-08 DIAGNOSIS — M6281 Muscle weakness (generalized): Secondary | ICD-10-CM

## 2017-04-08 DIAGNOSIS — R279 Unspecified lack of coordination: Secondary | ICD-10-CM

## 2017-04-08 DIAGNOSIS — G8929 Other chronic pain: Secondary | ICD-10-CM

## 2017-04-08 NOTE — Therapy (Signed)
[facility] [address] [place], [place], [address] Phone: [phone]   Fax:  [phone]  Physical Therapy Evaluation  Patient Details  Name: Danielle Guzman MRN: [id number] Date of Birth: [birth year] Referring Provider: Dr. Heath Lark   Encounter Date: 04/08/2017  PT End of Session - 04/08/17 1426    Visit Number  6    Date for PT Re-Evaluation  06/03/17 pelvic    Authorization Type  BCBS/ coinsurance 30 visit limit combine with PT/OT/Chiropractor    Authorization - Visit Number  6    Authorization - Number of Visits  30    PT Start Time  2458    PT Stop Time  1230    PT Time Calculation (min)  45 min    Activity Tolerance  Patient tolerated treatment well    Behavior During Therapy  [facility] for tasks assessed/performed       Past Medical History:  Diagnosis Date  . Cancer ([place]) 2018   endometrial   . Headache    hx migraines yrs ago  . Malaria as child  . Miscarriage    x 2  . Pneumonia yrs ago  . Seizures ([place])    petite mal seizure in high school x 1, none since    Past Surgical History:  Procedure Laterality Date  . DENTAL SURGERY     gum graft 20 yrs ago and again on 08/26/16  . DILATATION & CURETTAGE/HYSTEROSCOPY WITH MYOSURE N/A 08/11/2016   Procedure: DILATATION & CURETTAGE/HYSTEROSCOPY WITH MYOSURE;  Surgeon: Jerelyn Charles, MD;  Location: [facility];  Service: Gynecology;  Laterality: N/A;  polyp removal  . IR FLUORO GUIDE PORT INSERTION RIGHT  11/10/2016  . IR REMOVAL TUN ACCESS W/ PORT W/O FL MOD SED  03/11/2017  . IR US GUIDE VASC ACCESS RIGHT  11/10/2016  . LYMPH NODE BIOPSY N/A 09/01/2016   Procedure: SENTINEL LYMPH NODE BIOPSY;  Surgeon: Everitt Amber, MD;  Location: [facility];  Service: Gynecology;  Laterality: N/A;  . ROBOTIC ASSISTED TOTAL HYSTERECTOMY WITH BILATERAL SALPINGO OOPHERECTOMY Bilateral 09/01/2016   Procedure: ROBOTIC ASSISTED TOTAL HYSTERECTOMY WITH BILATERAL SALPINGO OOPHORECTOMY;   Surgeon: Everitt Amber, MD;  Location: [facility];  Service: Gynecology;  Laterality: Bilateral;  . WISDOM TOOTH EXTRACTION      There were no vitals filed for this visit.   Subjective Assessment - 04/08/17 1154    Subjective  Always had pain with intercourse. Patient used vaginal weights for urinary leakage prior to hysterectomy. Patient has not had intercourse since surgery.  Patient has a deep ache.  Patient will go to eliminate her bladder.  She will lean forward to eliminate.  Patient will leak when she stands up from the commode.  Changes mini pad 3 to 4 times per day due to it being wet.     Pertinent History  ovarian cancer with complete hysterectomy 09/01/16 and SLNB, completed chemotherapy on 02/23/17    Patient Stated Goals  improve urinary leakage; eliminate pain    Currently in Pain?  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 Heath Lark    Onset Date/Surgical Date  09/01/17    Prior Therapy  no pelvic floor therapy      Precautions   Precautions  Other (comment)    Precaution Comments  at risk for LE lymphedema      Restrictions   Weight Bearing Restrictions  No      Balance Screen   Has the patient fallen in the past 6 months  No    Has the patient had a decrease in activity level because of a fear of falling?   No    Is the patient reluctant to leave their home because of a fear of falling?   No      Home Film/video editor residence    Living Arrangements  Spouse/significant other     Available Help at Discharge  Family    Type of [place] to enter    Entrance Stairs-Number of Steps  6    Entrance Stairs-Rails  Can reach both    [place]  Two level    Alternate Level Stairs-Number of Steps  20    Alternate Level Stairs-Rails  Right    Home Equipment  None      Prior Function   Level of Independence  Independent    Vocation  Part time employment    Vocation Requirements  pt is a massage therapist, use proper body mechanics, standing, bending, lifting, computer work    Leisure  pt exercises 5days/wk for 20-30 min - takes walks, gentle yoga, dancing- prior to sugery pt was walking 2-5 miles/day      Cognition   Overall Cognitive Status  Within Functional Limits for tasks assessed      Observation/Other Assessments   Observations  pt reports neuropathy in bilateral feet and hands      Posture/Postural Control   Posture/Postural Control  No significant limitations      ROM / Strength   AROM / PROM / Strength  AROM;PROM;Strength      AROM   Overall AROM Comments  lumbar ROM is full      Strength   Right Hip Flexion  3/5    Right Hip ABduction  3/5    Right Hip ADduction  3/5    Left Hip Flexion  3/5    Left Hip ABduction  3/5    Left Hip ADduction  3/5      Palpation   SI assessment   pelvis in correct alignment    Palpation comment  tenderness in lower abdomen, touch the left diaphgram with referr pain into the pelvis, tender in bil. hip adductor, upper quad, bil. piriformis, bil. gluteus medius, bil. levator ani      Transfers   Transfers  Not assessed      Ambulation/Gait   Ambulation/Gait  No             Objective measurements completed on examination: See above findings.    Pelvic Floor Special Questions - 04/08/17 0001    Currently Sexually Active  No not able to since hysterectomy    Urinary Leakage  Yes    Pad use  3-4 mini pads  Activities that cause leaking  With strong  urge;Bending;Lifting;Coughing;Sneezing;Laughing get up from a commode    Urinary urgency  Yes    Skin Integrity  Intact    Pelvic Floor Internal Exam  Patient confirms indentification and approves PT to assess pelvic floor integrity and treatment    Exam Type  Vaginal    Palpation  tenderness located in bulbocavernosus, ischicavernosus, and transverse perineum    Strength  fair squeeze, definite lift    Strength # of reps  5 can do quick flicks but not relax fully inbetween them    Tone  increased tone       [facility] Adult PT Treatment/Exercise - 04/08/17 0001      Self-Care   Self-Care  Other Self-Care Comments    Other Self-Care Comments   Spent alot of time educating pt on other exs she could add to her HEP and answering her questions regarding this and demonstrating some for her, and she demonstrated some as well. Pt verbalized understanding all by end of session and handout issued.       Knee/Hip Exercises: Aerobic   Recumbent Bike  Level 1 x7 minutes      Knee/Hip Exercises: Seated   Long Arc Quad  Strengthening;Right;Left;2 sets;10 reps;Weights 5 sec holds    Long Arc Quad Weight  1 lbs. first set no weight, second set with weight    Ball Squeeze  10x with 5 sec holds with bolster    Marching  Strengthening;Right;Left;10 reps;Weights    Marching Weights  1 lbs.      Knee/Hip Exercises: Sidelying   Hip ADduction  Strengthening;Right;Left;10 reps 3-4 sec holds             PT Education - 04/08/17 1426    Education provided  Yes    Education Details  abdominal massage; diaphgram release    Person(s) Educated  Patient    Methods  Explanation;Handout    Comprehension  Verbalized understanding       PT Short Term Goals - 04/08/17 1441      PT SHORT TERM GOAL #4   Title  Deep ache in pelvic area durining daily tasks decreased >/= 25% due to improve pelvic floor muscle tissue mobility    Time  8    Period  Weeks    Target Date  05/06/17      PT SHORT TERM GOAL #5    Title  ability to have penile penetration with her husband with pain decreased >/= 20% better     Time  8    Period  Weeks    Status  New    Target Date  05/06/17      Additional Short Term Goals   Additional Short Term Goals  Yes      PT SHORT TERM GOAL #6   Title  urinary leakage decreased >/= 25% and is down to 2 light pad per day due to increased pelvic floor strength    Time  8    Period  Weeks    Status  New    Target Date  05/06/17        PT Long Term Goals - 04/08/17 1447      Additional Long Term Goals   Additional Long Term Goals  Yes      PT LONG TERM GOAL #6   Title  urinary leakage decreased >/= 75% so she is using 1 light pad per day due to increased pelvic floor strength  Time  8    Period  Weeks    Status  New    Target Date  06/03/17      PT LONG TERM GOAL #7   Title  able to have penile penetration with >/= 50% decreased in pain    Time  8    Period  Weeks    Status  New    Target Date  06/03/17      PT LONG TERM GOAL #8   Title  Deep ache in the pelvis has decreased >/= 75% due to improved tissue mobility    Time  8    Period  Weeks    Status  New    Target Date  06/03/17             Plan - 04/08/17 1428    Clinical Impression Statement  Patient is a 52 year old female with endometrial cancer.  She had a hysterectomy on 09/01/2016.  Since then she has been leaking urine with activity and transitional movements.  Patient wears 3-4 light day pads per day.  Pelvic floor strength is 3/5 holding for 5 seconds.  Patient is able to perform 5 quick flicks but unable to fully relax between each contraction.  Palpable tenderness located in lower abdomen, bilateral hip adductors, bilateral quadriceps, obturator internist, levaotr ani, bulbocavernosus, ischiocavernosus, superficial transeverse perineum, and urethra sphincter. Patient hip strength averages 3/5.  Patient has not had interacourse due to surgeons orders to not have interocurse until 6-8  months after surgery.  Patient will benefit from skilled therapy to reduce pain, improve tissue mobility, and improve strength to reduce urinary leakage and pain.     History and Personal Factors relevant to plan of care:  Endometrial cancer, hysterectomy 09/01/2016, lymhphedema in legs, not able to work full time due to fatique and pain    Clinical Presentation  Evolving    Clinical Presentation due to:  effects of chemotherapy, weakness, urinary leakage    Clinical Decision Making  Moderate    Clinical Impairments Affecting Rehab Potential  pt with increased left knee and right shoulder pain    PT Frequency  2x / week may change due to insurance limit    PT Duration  8 weeks    PT Treatment/Interventions  ADLs/Self Care Home Management;Electrical Stimulation;Neuromuscular re-education;Patient/family education;Therapeutic exercise;Therapeutic activities;Balance training;Manual techniques    PT Next Visit Plan  soft tissue work to abdomen, pelvic floor, hip adductors, quads, hip stretches to open the pelvic floor muscles    PT Home Exercise Plan  progress as needed    Consulted and Agree with Plan of Care  Patient       Patient will benefit from skilled therapeutic intervention in order to improve the following deficits and impairments:  Increased fascial restricitons, Pain, Decreased coordination, Increased muscle spasms, Impaired tone, Decreased activity tolerance, Decreased endurance, Decreased strength  Visit Diagnosis: Muscle weakness (generalized) - Plan: PT plan of care cert/re-cert  Unspecified lack of coordination - Plan: PT plan of care cert/re-cert  Other muscle spasm - Plan: PT plan of care cert/re-cert     Problem List Patient Active Problem List   Diagnosis Date Noted  . Physical deconditioning 03/25/2017  . Obesity (BMI 30.0-34.9) 03/25/2017  . Drug-induced hyperglycemia 12/23/2016  . Port catheter in place 12/02/2016  . Dysuria 12/02/2016  . Peripheral neuropathy due  to chemotherapy ([place]) 12/02/2016  . Acute rhinitis 11/04/2016  . Goals of care, counseling/discussion 11/04/2016  . 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 Endometrial cancer ([place]) 08/24/2016    Earlie Counts, PT 04/08/17 2:53 PM   [facility] [address], [place], [place], [address] Phone: [phone]   Fax:  [phone]  Name: ALISSA PHARR MRN: [id number] Date of Birth: 11-19-1965

## 2017-04-08 NOTE — Patient Instructions (Signed)
HIP: Adduction - Side-Lying    Place band around leg. Lie on side with top leg crossed over banded leg. Raise bottom leg up. Hold _3-5__ seconds.  _10__ reps per set, _2__ sets per day.   -With your resistance bands can do 4 way straight leg raises (flexion, abduction, extension, adduction turning between each) in standing, holding on for support. Try to keep swinging leg off ground throughout for stability ex on weightbearing leg.   -Sit to stand with ball squeeze between knees for medial thigh strengthening in front of chair.  -Bridges with ball squeeze   Can do 10 times of each, working up to 2-3 sets.   Cancer Rehab [phone]

## 2017-04-08 NOTE — Therapy (Signed)
[place], [place], [address] Phone: [phone]   Fax:  [phone]  Physical Therapy Treatment  Patient Details  Name: Danielle Guzman MRN: [id number] Date of Birth: Oct 01, 1965 Referring Provider: Dr Alvy Bimler   Encounter Date: 04/08/2017  PT End of Session - 04/08/17 1023    Visit Number  5    Number of Visits  17    Date for PT Re-Evaluation  05/24/17    PT Start Time  0936    PT Stop Time  1019    PT Time Calculation (min)  43 min    Activity Tolerance  Patient tolerated treatment well    Behavior During Therapy  [facility] for tasks assessed/performed       Past Medical History:  Diagnosis Date  . Cancer ([place]) 2018   endometrial   . Headache    hx migraines yrs ago  . Malaria as child  . Miscarriage    x 2  . Pneumonia yrs ago  . Seizures ([place])    petite mal seizure in high school x 1, none since    Past Surgical History:  Procedure Laterality Date  . DENTAL SURGERY     gum graft 20 yrs ago and again on 08/26/16  . DILATATION & CURETTAGE/HYSTEROSCOPY WITH MYOSURE N/A 08/11/2016   Procedure: DILATATION & CURETTAGE/HYSTEROSCOPY WITH MYOSURE;  Surgeon: Jerelyn Charles, MD;  Location: [facility];  Service: Gynecology;  Laterality: N/A;  polyp removal  . IR FLUORO GUIDE PORT INSERTION RIGHT  11/10/2016  . IR REMOVAL TUN ACCESS W/ PORT W/O FL MOD SED  03/11/2017  . IR US GUIDE VASC ACCESS RIGHT  11/10/2016  . LYMPH NODE BIOPSY N/A 09/01/2016   Procedure: SENTINEL LYMPH NODE BIOPSY;  Surgeon: Everitt Amber, MD;  Location: [facility];  Service: Gynecology;  Laterality: N/A;  . ROBOTIC ASSISTED TOTAL HYSTERECTOMY WITH BILATERAL SALPINGO OOPHERECTOMY Bilateral 09/01/2016   Procedure: ROBOTIC ASSISTED TOTAL HYSTERECTOMY WITH BILATERAL SALPINGO OOPHORECTOMY;  Surgeon: Everitt Amber, MD;  Location: [facility];  Service: Gynecology;  Laterality: Bilateral;  . WISDOM TOOTH EXTRACTION      There were no vitals filed for this  visit.  Subjective Assessment - 04/08/17 0940    Subjective  My Rt knee is starting to feel better, but my Lt knee is still bothering me. Soreness was about the same after last visit, but overall it just kind of comes and goes.     Pertinent History  ovarian cancer with complete hysterectomy 09/01/16 and SLNB, completed chemotherapy on 02/23/17    Patient Stated Goals  to get strength back and get mobile again and be pain free    Currently in Pain?  Yes    Pain Score  4     Pain Location  Knee    Pain Orientation  Left;Medial    Pain Descriptors / Indicators  Aching;Dull;Sharp    Pain Type  Chronic pain    Pain Onset  More than a month ago    Pain Frequency  Constant    Aggravating Factors   flexion and extension, prolonged standing sometimes    Pain Relieving Factors  getting in hot tub                      [facility] Adult PT Treatment/Exercise - 04/08/17 0001      Self-Care   Self-Care  Other Self-Care Comments    Other Self-Care Comments   Spent alot of time educating pt  on other exs she could add to her HEP and answering her questions regarding this and demonstrating some for her, and she demonstrated some as well. Pt verbalized understanding all by end of session and handout issued.       Knee/Hip Exercises: Aerobic   Recumbent Bike  Level 1 x7 minutes      Knee/Hip Exercises: Seated   Long Arc Quad  Strengthening;Right;Left;2 sets;10 reps;Weights 5 sec holds    Long Arc Quad Weight  1 lbs. first set no weight, second set with weight    Ball Squeeze  10x with 5 sec holds with bolster    Marching  Strengthening;Right;Left;10 reps;Weights    Marching Weights  1 lbs.      Knee/Hip Exercises: Sidelying   Hip ADduction  Strengthening;Right;Left;10 reps 3-4 sec holds             PT Education - 04/08/17 1018    Education provided  Yes    Education Details  VMO strengthening in varying positions and standing hip 4 way raises with resistance that pt has at her  work    [employer]) Educated  Patient    Methods  Explanation;Demonstration;Handout    Comprehension  Verbalized understanding;Returned demonstration;Need further instruction       PT Short Term Goals - 03/29/17 1456      PT SHORT TERM GOAL #1   Title  Pt will be able to complete 9 sit to stands in 30 sec to decrease risk of falls    Baseline  7    Time  4    Period  Weeks    Status  New    Target Date  04/26/17      PT SHORT TERM GOAL #2   Title  Pt will demonstrate 3+/5 hip flexor strength to decrease risk of falls when ambulating    Baseline  3/5    Time  4    Period  Weeks    Status  New    Target Date  04/26/17      PT SHORT TERM GOAL #3   Title  Pt will report a 25% improvement in left knee pain to allow for improved mobility    Time  4    Period  Weeks    Status  New    Target Date  04/26/17        PT Long Term Goals - 03/29/17 1458      PT LONG TERM GOAL #1   Title  Pt will be independent in a home exercise program for continued strengthening and stretching    Time  8    Period  Weeks    Status  New    Target Date  05/24/17      PT LONG TERM GOAL #2   Title  Pt will demonstrate 4/5 knee flexor strength to decrease risk of falls    Baseline  3/5    Time  8    Period  Weeks    Status  New    Target Date  05/24/17      PT LONG TERM GOAL #3   Title  Pt will report 75% improvement in left knee pain to allow pt to complete her job using proper body mechanincs    Time  8    Period  Weeks    Status  New    Target Date  05/24/17      PT LONG TERM GOAL #4   Title  Pt will  demonstrate 4/5 bilateral ankle dorsiflexor strength to decrease risk of falls    Baseline  3/5    Time  8    Period  Weeks    Status  New    Target Date  05/24/17      PT LONG TERM GOAL #5   Title  Pt will be able to complete 13 sit to stands in 30 seconds to decrease risk of falls    Baseline  7    Time  8    Period  Weeks    Status  New    Target Date  05/24/17             Plan - 04/08/17 1023    Clinical Impression Statement  Progressed pts HEP today and she did well with exercises done during therapy. Also verbally instructed pt and demonstrated these for her while we were discussing other ways pt could incorporate exercises into her daily routine, especially at work where she has resistance bands available. Pt was very encouraged with all we discussed today.     Rehab Potential  Good    Clinical Impairments Affecting Rehab Potential  pt with increased left knee and right shoulder pain    PT Frequency  2x / week    PT Duration  8 weeks    PT Treatment/Interventions  ADLs/Self Care Home Management;Electrical Stimulation;Iontophoresis 4mg /ml Dexamethasone;Balance training;Therapeutic exercise;Neuromuscular re-education;Manual techniques    PT Next Visit Plan  continue supine strengthening exercises, bike, 4 square step test; review HEP as didn't have time to have pt do all    PT Home Exercise Plan  briding, SLR, quad set, glute set, SAQ, ball squeezes, clam shell with green theraband    Consulted and Agree with Plan of Care  Patient       Patient will benefit from skilled therapeutic intervention in order to improve the following deficits and impairments:  Decreased balance, Decreased endurance, Decreased mobility, Difficulty walking, Impaired sensation, Increased edema, Decreased strength, Postural dysfunction, Pain  Visit Diagnosis: Chronic pain of left knee  Muscle weakness (generalized)  Difficulty in walking, not elsewhere classified     Problem List Patient Active Problem List   Diagnosis Date Noted  . Physical deconditioning 03/25/2017  . Obesity (BMI 30.0-34.9) 03/25/2017  . Drug-induced hyperglycemia 12/23/2016  . Port catheter in place 12/02/2016  . Dysuria 12/02/2016  . Peripheral neuropathy due to chemotherapy ([facility]) 12/02/2016  . Acute rhinitis 11/04/2016  . Goals of care, counseling/discussion 11/04/2016  . Secondary  malignant neoplasm of fallopian tube ([place]) 09/30/2016  . Endometrial cancer ([place]) 08/24/2016    Otelia Limes, PTA 04/08/2017, 10:25 AM  [place], [place], [address] Phone: [phone]   Fax:  [phone]  Name: NECHUMA BOVEN MRN: [id number] Date of Birth: 1965/05/24

## 2017-04-08 NOTE — Patient Instructions (Signed)
About Abdominal Massage  Abdominal massage, also called external colon massage, is a self-treatment circular massage technique that can reduce and eliminate gas and ease constipation. The colon naturally contracts in waves in a clockwise direction starting from inside the right hip, moving up toward the ribs, across the belly, and down inside the left hip.  When you perform circular abdominal massage, you help stimulate your colon's normal wave pattern of movement called peristalsis.  It is most beneficial when done after eating.  Positioning You can practice abdominal massage with oil while lying down, or in the shower with soap.  Some people find that it is just as effective to do the massage through clothing while sitting or standing.  How to Massage Start by placing your finger tips or knuckles on your right side, just inside your hip bone.  . Make small circular movements while you move upward toward your rib cage.   . Once you reach the bottom right side of your rib cage, take your circular movements across to the left side of the bottom of your rib cage.  . Next, move downward until you reach the inside of your left hip bone.  This is the path your feces travel in your colon. . Continue to perform your abdominal massage in this pattern for 10 minutes each day.     You can apply as much pressure as is comfortable in your massage.  Start gently and build pressure as you continue to practice.  Notice any areas of pain as you massage; areas of slight pain may be relieved as you massage, but if you have areas of significant or intense pain, consult with your healthcare provider.  Other Considerations . General physical activity including bending and stretching can have a beneficial massage-like effect on the colon.  Deep breathing can also stimulate the colon because breathing deeply activates the same nervous system that supplies the colon.   . Abdominal massage should always be used in  combination with a bowel-conscious diet that is high in the proper type of fiber for you, fluids (primarily water), and a regular exercise program.   Make L shape with bringing your hands out to in and down just above the groin. Continue with diaphragmatic breathing.   [facility] [address], [place],  [address] Phone # [phone] Fax [phone]

## 2017-04-12 ENCOUNTER — Encounter: Payer: Self-pay | Admitting: Physical Therapy

## 2017-04-12 ENCOUNTER — Ambulatory Visit: Payer: BLUE CROSS/BLUE SHIELD | Admitting: Physical Therapy

## 2017-04-12 DIAGNOSIS — R262 Difficulty in walking, not elsewhere classified: Secondary | ICD-10-CM

## 2017-04-12 DIAGNOSIS — M25562 Pain in left knee: Secondary | ICD-10-CM

## 2017-04-12 DIAGNOSIS — G8929 Other chronic pain: Secondary | ICD-10-CM

## 2017-04-12 NOTE — Therapy (Signed)
[place], [place], [address] Phone: [phone]   Fax:  [phone]  Physical Therapy Treatment  Patient Details  Name: Danielle Guzman MRN: [id number] Date of Birth: 1965/12/27 Referring Provider: Dr. Heath Lark   Encounter Date: 04/12/2017  PT End of Session - 04/12/17 0950    Visit Number  7    Number of Visits  17    Date for PT Re-Evaluation  05/24/17 for cancer rehab    Authorization Type  BCBS/ coinsurance 30 visit limit combine with PT/OT/Chiropractor    Authorization - Visit Number  7    Authorization - Number of Visits  30    PT Start Time  0901    PT Stop Time  0947    PT Time Calculation (min)  46 min    Activity Tolerance  Patient tolerated treatment well    Behavior During Therapy  [facility] for tasks assessed/performed       Past Medical History:  Diagnosis Date  . Cancer ([place]) 2018   endometrial   . Headache    hx migraines yrs ago  . Malaria as child  . Miscarriage    x 2  . Pneumonia yrs ago  . 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 DENTAL SURGERY     gum graft 20 yrs ago and again on 08/26/16  . DILATATION & CURETTAGE/HYSTEROSCOPY WITH MYOSURE N/A 08/11/2016   Procedure: DILATATION & CURETTAGE/HYSTEROSCOPY WITH MYOSURE;  Surgeon: Jerelyn Charles, MD;  Location: [facility];  Service: Gynecology;  Laterality: N/A;  polyp removal  . IR FLUORO GUIDE PORT INSERTION RIGHT  11/10/2016  . IR REMOVAL TUN ACCESS W/ PORT W/O FL MOD SED  03/11/2017  . IR US GUIDE VASC ACCESS RIGHT  11/10/2016  . LYMPH NODE BIOPSY N/A 09/01/2016   Procedure: SENTINEL LYMPH NODE BIOPSY;  Surgeon: Everitt Amber, MD;  Location: [facility];  Service: Gynecology;  Laterality: N/A;  . ROBOTIC ASSISTED TOTAL HYSTERECTOMY WITH BILATERAL SALPINGO OOPHERECTOMY Bilateral 09/01/2016   Procedure: ROBOTIC ASSISTED TOTAL HYSTERECTOMY WITH BILATERAL  SALPINGO OOPHORECTOMY;  Surgeon: Everitt Amber, MD;  Location: [facility];  Service: Gynecology;  Laterality: Bilateral;  . WISDOM TOOTH EXTRACTION      There were no vitals filed for this visit.  Subjective Assessment - 04/12/17 0902    Subjective  I have backed off of some of the exercises because my knee hurt too much to do too many at one time.     Pertinent History  ovarian cancer with complete hysterectomy 09/01/16 and SLNB, completed chemotherapy on 02/23/17    Patient Stated Goals  to get stronger    Currently in Pain?  Yes    Pain Score  4     Pain Location  Knee    Pain Orientation  Left    Pain Descriptors / Indicators  Aching    Pain Type  Chronic pain    Pain Onset  More than a month ago    Pain Frequency  Constant                      [facility] Adult PT Treatment/Exercise - 04/12/17 0001      Knee/Hip Exercises: Standing   Hip Abduction  AROM;Both;1 set;10 reps;Knee straight    Hip Extension  AROM;Both;1 set;10 reps;Knee straight    Other Standing Knee Exercises  hip adduction AROM knee straight x 10 reps, hip flexion AROM x 10 reps knee straight      Knee/Hip Exercises: Seated   Long Arc Quad  Strengthening;Right;Left;2 sets;10 reps;Weights 5 sec holds    Long Arc Quad Weight  1 lbs. first set no weight, second set with weight    Ball Squeeze  10x with 5 sec holds with bolster    Marching  Strengthening;Right;Left;10 reps;Weights    Marching Weights  1 lbs.      Knee/Hip Exercises: Supine   Quad Sets  Both;1 set;10 reps;Strengthening with 5 sec holds, 1 lb ankle weights    Heel Slides  Strengthening;Both;1 set;10 reps with 1 lb ankle weights    Bridges  Strengthening;1 set;10 reps with 3 sec holds    Straight Leg Raises  Strengthening;Both;10 reps with 1 lb weight      Knee/Hip Exercises: Sidelying   Hip ABduction  Strengthening;Both;1 set;10 reps with 1 lb ankle weights    Hip ADduction  Strengthening;Right;Left;10 reps 3-4 sec holds, with 1 lb ankle  weights               PT Short Term Goals - 04/12/17 1002      PT SHORT TERM GOAL #1   Title  Pt will be able to complete 9 sit to stands in 30 sec to decrease risk of falls    Baseline  7    Time  4    Period  Weeks    Status  On-going      PT SHORT TERM GOAL #2   Title  Pt will demonstrate 3+/5 hip flexor strength to decrease risk of falls when ambulating    Baseline  3/5    Time  4    Period  Weeks    Status  On-going      PT SHORT TERM GOAL #3   Title  Pt will report a 25% improvement in left knee pain to allow for improved mobility    Time  4    Period  Weeks        PT Long Term Goals - 04/12/17 1001      PT LONG TERM GOAL #1   Title  Pt will be independent in a home exercise program for continued strengthening and stretching    Time  8    Period  Weeks    Status  On-going      PT LONG TERM GOAL #2   Title  Pt will demonstrate 4/5 knee flexor strength to decrease risk of falls    Baseline  3/5    Time  8    Period  Weeks    Status  On-going      PT LONG TERM GOAL #3   Title  Pt will report 75% improvement in left knee pain to allow pt to complete her job using proper body mechanincs    Time  8    Period  Weeks    Status  On-going      PT LONG TERM GOAL #4   Title  Pt will demonstrate 4/5 bilateral ankle dorsiflexor strength to decrease risk of falls    Baseline  3/5    Time  8    Period  Weeks    Status  On-going      PT LONG TERM GOAL #5   Title  Pt will be able to complete 13 sit to stands in 30 seconds to decrease risk of falls  Baseline  7    Time  8    Period  Weeks    Status  On-going            Plan - 04/12/17 3149    Clinical Impression Statement  Added 1 lb ankle weights to pt's supine and seated exercises today and pt did very well. Encouraged pt to see orthopedic doctor for ongoing left knee pain that is limiting her progress in therapy. She states that her right knee pain has improved since beginning therapy. She had  increased left knee pain when doing 4 way hip standing with theraband so she was educated to do these with no resistance.     Rehab Potential  Good    Clinical Impairments Affecting Rehab Potential  pt with increased left knee and right shoulder pain    PT Frequency  2x / week    PT Duration  8 weeks    PT Treatment/Interventions  ADLs/Self Care Home Management;Electrical Stimulation;Neuromuscular re-education;Therapeutic exercise;Balance training;Manual techniques;Iontophoresis 4mg /ml Dexamethasone    PT Next Visit Plan  continue supine and seated exercies and progress as needed    PT Home Exercise Plan  4 way hip no resistance, briding, hip abd/add in s/l, SLR, SAQ    Consulted and Agree with Plan of Care  Patient       Patient will benefit from skilled therapeutic intervention in order to improve the following deficits and impairments:  Decreased balance, Decreased endurance, Decreased mobility, Difficulty walking, Impaired sensation, Increased edema, Postural dysfunction, Decreased strength, Pain  Visit Diagnosis: Difficulty in walking, not elsewhere classified  Chronic pain of left knee     Problem List Patient Active Problem List   Diagnosis Date Noted  . Physical deconditioning 03/25/2017  . Obesity (BMI 30.0-34.9) 03/25/2017  . Drug-induced hyperglycemia 12/23/2016  . Port catheter in place 12/02/2016  . Dysuria 12/02/2016  . 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 Acute rhinitis 11/04/2016  . Goals of care, counseling/discussion 11/04/2016  . 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 Endometrial cancer ([place]) 08/24/2016    Allyson Sabal [facility] 04/12/2017, 10:02 AM  [place], [place], [address] Phone: [phone]   Fax:  [phone]  Name: TYSHAWNA ALARID MRN: [id number] Date of Birth: 08-Mar-1966  Manus Gunning, PT 04/12/17 10:02  AM

## 2017-04-13 ENCOUNTER — Ambulatory Visit: Payer: BLUE CROSS/BLUE SHIELD | Attending: Hematology and Oncology | Admitting: Physical Therapy

## 2017-04-13 ENCOUNTER — Encounter: Payer: Self-pay | Admitting: Physical Therapy

## 2017-04-13 DIAGNOSIS — M62838 Other muscle spasm: Secondary | ICD-10-CM | POA: Insufficient documentation

## 2017-04-13 DIAGNOSIS — M6281 Muscle weakness (generalized): Secondary | ICD-10-CM

## 2017-04-13 DIAGNOSIS — R279 Unspecified lack of coordination: Secondary | ICD-10-CM | POA: Insufficient documentation

## 2017-04-13 NOTE — Therapy (Signed)
[facility] [address], [place], [place], [address] Phone: [phone]   Fax:  [phone]  Physical Therapy Treatment  Patient Details  Name: Danielle Guzman MRN: [id number] Date of Birth: 09-01-1965 Referring Provider: Dr. Heath Lark   Encounter Date: 04/13/2017  PT End of Session - 04/13/17 1319    Visit Number  8    Date for PT Re-Evaluation  06/03/17 pelvis    Authorization Type  BCBS/ coinsurance 30 visit limit combine with PT/OT/Chiropractor    Authorization - Visit Number  8    Authorization - Number of Visits  30    PT Start Time  1230    PT Stop Time  1310    PT Time Calculation (min)  40 min    Activity Tolerance  Patient tolerated treatment well    Behavior During Therapy  [facility] for tasks assessed/performed       Past Medical History:  Diagnosis Date  . Cancer ([place]) 2018   endometrial   . Headache    hx migraines yrs ago  . Malaria as child  . Miscarriage    x 2  . Pneumonia yrs ago  . Seizures ([place])    petite mal seizure in high school x 1, none since    Past Surgical History:  Procedure Laterality Date  . DENTAL SURGERY     gum graft 20 yrs ago and again on 08/26/16  . DILATATION & CURETTAGE/HYSTEROSCOPY WITH MYOSURE N/A 08/11/2016   Procedure: DILATATION & CURETTAGE/HYSTEROSCOPY WITH MYOSURE;  Surgeon: Jerelyn Charles, MD;  Location: [facility];  Service: Gynecology;  Laterality: N/A;  polyp removal  . IR FLUORO GUIDE PORT INSERTION RIGHT  11/10/2016  . IR REMOVAL TUN ACCESS W/ PORT W/O FL MOD SED  03/11/2017  . IR US GUIDE VASC ACCESS RIGHT  11/10/2016  . LYMPH NODE BIOPSY N/A 09/01/2016   Procedure: SENTINEL LYMPH NODE BIOPSY;  Surgeon: Everitt Amber, MD;  Location: [facility];  Service: Gynecology;  Laterality: N/A;  . ROBOTIC ASSISTED TOTAL HYSTERECTOMY WITH BILATERAL SALPINGO OOPHERECTOMY Bilateral 09/01/2016   Procedure: ROBOTIC ASSISTED TOTAL HYSTERECTOMY WITH BILATERAL SALPINGO OOPHORECTOMY;   Surgeon: Everitt Amber, MD;  Location: [facility];  Service: Gynecology;  Laterality: Bilateral;  . WISDOM TOOTH EXTRACTION      There were no vitals filed for this visit.  Subjective Assessment - 04/13/17 1234    Subjective  I had less aching in my pelvis after last visit for pelvic floor therapy. I get chiropractor weekly.  I have 2-3 bowel movements per day.  Several times I have had a bad ache with bowel movement.     Pertinent History  ovarian cancer with complete hysterectomy 09/01/16 and SLNB, completed chemotherapy on 02/23/17    Patient Stated Goals  to get stronger    Currently in Pain?  Yes    Pain Score  3     Pain Location  Vagina    Pain Orientation  Mid    Pain Descriptors / Indicators  Aching    Pain Type  Chronic pain    Pain Onset  More than a month ago    Pain Frequency  Constant    Aggravating Factors   not sure what it makes it worse    Pain Relieving Factors  advil                      [facility] Adult PT Treatment/Exercise - 04/13/17 0001  Manual Therapy   Manual Therapy  Soft tissue mobilization;Myofascial release    Manual therapy comments  MLD to abdoment with triangle shapes and above bil. groin    Soft tissue mobilization  along the transverse abdominus, along the obliques, bil. psoas, lower abdominal fascia    Myofascial Release  release of the urogenital diaphgram, around the umbilicus, along the bladder area, release the pubovesical ligament               PT Short Term Goals - 04/12/17 1002      PT SHORT TERM GOAL #1   Title  Pt will be able to complete 9 sit to stands in 30 sec to decrease risk of falls    Baseline  7    Time  4    Period  Weeks    Status  On-going      PT SHORT TERM GOAL #2   Title  Pt will demonstrate 3+/5 hip flexor strength to decrease risk of falls when ambulating    Baseline  3/5    Time  4    Period  Weeks    Status  On-going      PT SHORT TERM GOAL #3   Title  Pt will report a 25% improvement in  left knee pain to allow for improved mobility    Time  4    Period  Weeks        PT Long Term Goals - 04/12/17 1001      PT LONG TERM GOAL #1   Title  Pt will be independent in a home exercise program for continued strengthening and stretching    Time  8    Period  Weeks    Status  On-going      PT LONG TERM GOAL #2   Title  Pt will demonstrate 4/5 knee flexor strength to decrease risk of falls    Baseline  3/5    Time  8    Period  Weeks    Status  On-going      PT LONG TERM GOAL #3   Title  Pt will report 75% improvement in left knee pain to allow pt to complete her job using proper body mechanincs    Time  8    Period  Weeks    Status  On-going      PT LONG TERM GOAL #4   Title  Pt will demonstrate 4/5 bilateral ankle dorsiflexor strength to decrease risk of falls    Baseline  3/5    Time  8    Period  Weeks    Status  On-going      PT LONG TERM GOAL #5   Title  Pt will be able to complete 13 sit to stands in 30 seconds to decrease risk of falls    Baseline  7    Time  8    Period  Weeks    Status  On-going            Plan - 04/13/17 1320    Clinical Impression Statement  Patient has reduction of abdominal swelling after treatment and improve mobility of abdominal tissue. Patient felt her abdominals region has opened up and released.  Patient was able to button her pants with greater ease.  Patient reports less achiness in the vaginal area since last visit.  Patient will benefit from skilled therapy to pain, improve tissue mobility and improve strength to reduce urinary leakage.  Rehab Potential  Good    Clinical Impairments Affecting Rehab Potential  pt with increased left knee and right shoulder pain    PT Frequency  2x / week    PT Duration  8 weeks    PT Treatment/Interventions  ADLs/Self Care Home Management;Electrical Stimulation;Neuromuscular re-education;Therapeutic exercise;Balance training;Manual techniques;Patient/family education;Therapeutic  activities    PT Next Visit Plan  soft tissue work to perineal area and bil. thighs, hip stretches    PT Home Exercise Plan  progress as needed    Recommended Other Services  MD signed initial eval for pelvic floor    Consulted and Agree with Plan of Care  Patient       Patient will benefit from skilled therapeutic intervention in order to improve the following deficits and impairments:  Decreased balance, Decreased endurance, Decreased mobility, Difficulty walking, Impaired sensation, Increased edema, Postural dysfunction, Decreased strength, Pain  Visit Diagnosis: Muscle weakness (generalized)  Unspecified lack of coordination  Other muscle spasm     Problem List Patient Active Problem List   Diagnosis Date Noted  . Physical deconditioning 03/25/2017  . Obesity (BMI 30.0-34.9) 03/25/2017  . Drug-induced hyperglycemia 12/23/2016  . Port catheter in place 12/02/2016  . Dysuria 12/02/2016  . 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 Acute rhinitis 11/04/2016  . Goals of care, counseling/discussion 11/04/2016  . 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 Endometrial cancer ([place]) 08/24/2016    Earlie Counts, PT 04/13/17 1:25 PM   [facility] [address], [place], [place], [address] Phone: [phone]   Fax:  [phone]  Name: Danielle Guzman MRN: [id number] Date of Birth: Jul 27, 1965

## 2017-04-14 ENCOUNTER — Encounter: Payer: Self-pay | Admitting: Physical Therapy

## 2017-04-14 ENCOUNTER — Ambulatory Visit: Payer: BLUE CROSS/BLUE SHIELD | Admitting: Physical Therapy

## 2017-04-14 DIAGNOSIS — M6281 Muscle weakness (generalized): Secondary | ICD-10-CM

## 2017-04-14 DIAGNOSIS — G8929 Other chronic pain: Secondary | ICD-10-CM

## 2017-04-14 DIAGNOSIS — M25562 Pain in left knee: Secondary | ICD-10-CM

## 2017-04-14 DIAGNOSIS — R262 Difficulty in walking, not elsewhere classified: Secondary | ICD-10-CM

## 2017-04-14 NOTE — Therapy (Signed)
[place], [place], [address] Phone: [phone]   Fax:  [phone]  Physical Therapy Treatment  Patient Details  Name: Danielle Guzman MRN: [id number] Date of Birth: March 08, 1966 Referring Provider: Dr. Heath Lark   Encounter Date: 04/14/2017  PT End of Session - 04/14/17 1653    Visit Number  9    Number of Visits  17    Date for PT Re-Evaluation  05/24/17    Authorization Type  BCBS/ coinsurance 30 visit limit combine with PT/OT/Chiropractor    Authorization - Visit Number  9    Authorization - Number of Visits  30    PT Start Time  1518    PT Stop Time  1605    PT Time Calculation (min)  47 min    Activity Tolerance  Patient tolerated treatment well    Behavior During Therapy  [facility] for tasks assessed/performed       Past Medical History:  Diagnosis Date  . Cancer ([place]) 2018   endometrial   . Headache    hx migraines yrs ago  . Malaria as child  . Miscarriage    x 2  . Pneumonia yrs ago  . 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 DENTAL SURGERY     gum graft 20 yrs ago and again on 08/26/16  . DILATATION & CURETTAGE/HYSTEROSCOPY WITH MYOSURE N/A 08/11/2016   Procedure: DILATATION & CURETTAGE/HYSTEROSCOPY WITH MYOSURE;  Surgeon: Jerelyn Charles, MD;  Location: [facility];  Service: Gynecology;  Laterality: N/A;  polyp removal  . IR FLUORO GUIDE PORT INSERTION RIGHT  11/10/2016  . IR REMOVAL TUN ACCESS W/ PORT W/O FL MOD SED  03/11/2017  . IR US GUIDE VASC ACCESS RIGHT  11/10/2016  . LYMPH NODE BIOPSY N/A 09/01/2016   Procedure: SENTINEL LYMPH NODE BIOPSY;  Surgeon: Everitt Amber, MD;  Location: [facility];  Service: Gynecology;  Laterality: N/A;  . ROBOTIC ASSISTED TOTAL HYSTERECTOMY WITH BILATERAL SALPINGO OOPHERECTOMY Bilateral 09/01/2016   Procedure: ROBOTIC ASSISTED TOTAL HYSTERECTOMY WITH BILATERAL SALPINGO  OOPHORECTOMY;  Surgeon: Everitt Amber, MD;  [facility];  Service: Gynecology;  Laterality: Bilateral;  . WISDOM TOOTH EXTRACTION      There were no vitals filed for this visit.  Subjective Assessment - 04/14/17 1519    Subjective  My exercises are going well. I think the standing exercises without the band is very helpful.     Pertinent History  ovarian cancer with complete hysterectomy 09/01/16 and SLNB, completed chemotherapy on 02/23/17    Patient Stated Goals  to get stronger    Currently in Pain?  Yes    Pain Score  4     Pain Location  Knee    Pain Orientation  Left    Pain Descriptors / Indicators  Aching    Pain Score  3    Pain Location  Foot    Pain Orientation  Right;Left                      [facility] Adult PT Treatment/Exercise - 04/14/17 0001      Knee/Hip Exercises: Standing   Hip Abduction  AROM;Both;1 set;10 reps;Knee straight 1 lb weight    Hip Extension  AROM;Both;1 set;10 reps;Knee straight 1 lb ankle weight    Other Standing Knee Exercises  hip adduction AROM knee straight x  10 reps, hip flexion AROM x 10 reps knee straight with 1 lb weight      Knee/Hip Exercises: Seated   Long Arc Quad  Strengthening;Right;Left;10 reps;Weights;2 sets 5 sec holds    Long Arc Quad Weight  1 lbs.    Marching  Strengthening;Right;Left;10 reps;Weights    Marching Weights  1 lbs.      Knee/Hip Exercises: Supine   Quad Sets  Both;1 set;10 reps;Strengthening with 5 sec holds, 1 lb ankle weights    Short Arc Quad Sets  Strengthening;Both;1 set;10 reps with 1 lb weight    Heel Slides  Strengthening;Both;1 set;10 reps with 1 lb ankle weights    Bridges  Strengthening;1 set;10 reps with 3 sec holds    Straight Leg Raises  Strengthening;Both;10 reps with 1 lb weight      Knee/Hip Exercises: Sidelying   Hip ABduction  Strengthening;Both;1 set;10 reps with 1 lb ankle weights    Hip ADduction  Strengthening;Right;Left;10 reps 3-4 sec holds, with 1 lb ankle weights                PT Short Term Goals - 04/12/17 1002      PT SHORT TERM GOAL #1   Title  Pt will be able to complete 9 sit to stands in 30 sec to decrease risk of falls    Baseline  7    Time  4    Period  Weeks    Status  On-going      PT SHORT TERM GOAL #2   Title  Pt will demonstrate 3+/5 hip flexor strength to decrease risk of falls when ambulating    Baseline  3/5    Time  4    Period  Weeks    Status  On-going      PT SHORT TERM GOAL #3   Title  Pt will report a 25% improvement in left knee pain to allow for improved mobility    Time  4    Period  Weeks        PT Long Term Goals - 04/12/17 1001      PT LONG TERM GOAL #1   Title  Pt will be independent in a home exercise program for continued strengthening and stretching    Time  8    Period  Weeks    Status  On-going      PT LONG TERM GOAL #2   Title  Pt will demonstrate 4/5 knee flexor strength to decrease risk of falls    Baseline  3/5    Time  8    Period  Weeks    Status  On-going      PT LONG TERM GOAL #3   Title  Pt will report 75% improvement in left knee pain to allow pt to complete her job using proper body mechanincs    Time  8    Period  Weeks    Status  On-going      PT LONG TERM GOAL #4   Title  Pt will demonstrate 4/5 bilateral ankle dorsiflexor strength to decrease risk of falls    Baseline  3/5    Time  8    Period  Weeks    Status  On-going      PT LONG TERM GOAL #5   Title  Pt will be able to complete 13 sit to stands in 30 seconds to decrease risk of falls    Baseline  7    Time  8    Period  Weeks    Status  On-going            Plan - 04/14/17 1654    Clinical Impression Statement  Added one lb ankle weight to all exercises today. Pt was able to do standing exercises with 1 lb weight. Pt has increased left knee discomfort while exercising. She was educated in an adductor stretch today. Will add more standing exercises at next session.     Rehab Potential  Good     Clinical Impairments Affecting Rehab Potential  pt with increased left knee and right shoulder pain    PT Frequency  2x / week    PT Duration  8 weeks    PT Treatment/Interventions  ADLs/Self Care Home Management;Electrical Stimulation;Neuromuscular re-education;Therapeutic exercise;Balance training;Manual techniques;Patient/family education;Therapeutic activities    PT Next Visit Plan  add more standing exercises, continue with supine and seated exercises with 1 lb weight, add bike    Consulted and Agree with Plan of Care  Patient       Patient will benefit from skilled therapeutic intervention in order to improve the following deficits and impairments:  Decreased balance, Decreased endurance, Decreased mobility, Difficulty walking, Impaired sensation, Decreased strength, Pain, Postural dysfunction  Visit Diagnosis: Muscle weakness (generalized)  Chronic pain of left knee  Difficulty in walking, not elsewhere classified     Problem List Patient Active Problem List   Diagnosis Date Noted  . Physical deconditioning 03/25/2017  . Obesity (BMI 30.0-34.9) 03/25/2017  . Drug-induced hyperglycemia 12/23/2016  . Port catheter in place 12/02/2016  . Dysuria 12/02/2016  . Peripheral neuropathy due to chemotherapy ([place]) 12/02/2016  . Acute rhinitis 11/04/2016  . Goals of care, counseling/discussion 11/04/2016  . 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 Endometrial cancer ([place]) 08/24/2016    Allyson Sabal [facility] 04/14/2017, 4:59 PM  [place], [place], [address] Phone: [phone]   Fax:  [phone]  Name: Danielle Guzman MRN: [id number] Date of Birth: Jul 16, 1965  Manus Gunning, PT 04/14/17 4:59 PM

## 2017-04-15 ENCOUNTER — Telehealth: Payer: Self-pay

## 2017-04-15 ENCOUNTER — Encounter: Payer: Self-pay | Admitting: Physical Therapy

## 2017-04-15 ENCOUNTER — Ambulatory Visit: Payer: BLUE CROSS/BLUE SHIELD | Admitting: Physical Therapy

## 2017-04-15 DIAGNOSIS — M6281 Muscle weakness (generalized): Secondary | ICD-10-CM | POA: Diagnosis not present

## 2017-04-15 DIAGNOSIS — R279 Unspecified lack of coordination: Secondary | ICD-10-CM

## 2017-04-15 NOTE — Telephone Encounter (Signed)
Called and left below message. Instructed to call for questions. Left message that the letter she is looking for for massage is out front with [employer], she can stop by and pick up letter.

## 2017-04-15 NOTE — Telephone Encounter (Signed)
Patient wanted to thank Dr. Alvy Bimler for ordering PT.  She is having challenges with her left knee. PT is going to work on it for a couple more weeks. If she needs a referral to Ortho would Dr. Alvy Bimler be able to to make that referral?

## 2017-04-15 NOTE — Therapy (Addendum)
[facility] [address] [place], [place], [address] Phone: [phone]   Fax:  [phone]  Physical Therapy Treatment  Patient Details  Name: Danielle Guzman MRN: [id number] Date of Birth: [birth year] Referring Provider: Dr. Heath Lark   Encounter Date: 04/15/2017  PT End of Session - 04/15/17 0858    Visit Number  10    Date for PT Re-Evaluation  06/03/17    Authorization Type  BCBS/ coinsurance 30 visit limit combine with PT/OT/Chiropractor    Authorization - Visit Number  10    Authorization - Number of Visits  30    PT Start Time  0800    PT Stop Time  4268    PT Time Calculation (min)  57 min    Activity Tolerance  Patient tolerated treatment well    Behavior During Therapy  [facility] for tasks assessed/performed       Past Medical History:  Diagnosis Date  . Cancer ([place]) 2018   endometrial   . Headache    hx migraines yrs ago  . Malaria as child  . Miscarriage    x 2  . Pneumonia yrs ago  . Seizures ([place])    petite mal seizure in high school x 1, none since    Past Surgical History:  Procedure Laterality Date  . DENTAL SURGERY     gum graft 20 yrs ago and again on 08/26/16  . DILATATION & CURETTAGE/HYSTEROSCOPY WITH MYOSURE N/A 08/11/2016   Procedure: DILATATION & CURETTAGE/HYSTEROSCOPY WITH MYOSURE;  Surgeon: Jerelyn Charles, MD;  Location: [facility];  Service: Gynecology;  Laterality: N/A;  polyp removal  . IR FLUORO GUIDE PORT INSERTION RIGHT  11/10/2016  . IR REMOVAL TUN ACCESS W/ PORT W/O FL MOD SED  03/11/2017  . IR US GUIDE VASC ACCESS RIGHT  11/10/2016  . LYMPH NODE BIOPSY N/A 09/01/2016   Procedure: SENTINEL LYMPH NODE BIOPSY;  Surgeon: Everitt Amber, MD;  [facility];  Service: Gynecology;  Laterality: N/A;  . ROBOTIC ASSISTED TOTAL HYSTERECTOMY WITH BILATERAL SALPINGO OOPHERECTOMY Bilateral 09/01/2016   Procedure: ROBOTIC ASSISTED TOTAL HYSTERECTOMY WITH BILATERAL SALPINGO OOPHORECTOMY;  Surgeon:  Everitt Amber, MD;  [facility]: [facility];  Service: Gynecology;  Laterality: Bilateral;  . WISDOM TOOTH EXTRACTION      There were no vitals filed for this visit.  Subjective Assessment - 04/15/17 0807    Subjective  My abdomen felt good.  I had to urinated alot.  My pants are loser due to decreased size of my abdomen.     Pertinent History  ovarian cancer with complete hysterectomy 09/01/16 and SLNB, completed chemotherapy on 02/23/17    Patient Stated Goals  to get stronger    Currently in Pain?  Yes    Pain Score  4     Pain Orientation  Left    Pain Descriptors / Indicators  Aching    Pain Type  Chronic pain    Pain Onset  More than a month ago    Pain Frequency  Constant    Aggravating Factors   not sure what makes it worse    Pain Relieving Factors  advil    Multiple Pain Sites  No                      [facility] Adult PT Treatment/Exercise - 04/15/17 0001      Manual Therapy   Manual Therapy  Soft tissue mobilization;Manual Lymphatic Drainage (MLD)  Manual therapy comments  MLD to abdoment with triangle shapes and above bil. groin; MLD to bilateral upper thigh.     Soft tissue mobilization  bilateral quads, hip adductors, hamstrings and pes anserine    Myofascial Release  right upper thigh               PT Short Term Goals - 04/15/17 0903      PT SHORT TERM GOAL #4   Title  Deep ache in pelvic area durining daily tasks decreased >/= 25% due to improve pelvic floor muscle tissue mobility    Time  8    Period  Weeks    Status  On-going      PT SHORT TERM GOAL #5   Title  ability to have penile penetration with her husband with pain decreased >/= 20% better     Time  8    Period  Weeks    Status  New      PT SHORT TERM GOAL #6   Title  urinary leakage decreased >/= 25% and is down to 2 light pad per day due to increased pelvic floor strength    Time  8    Period  Weeks    Status  New        PT Long Term Goals - 04/12/17 1001      PT LONG TERM  GOAL #1   Title  Pt will be independent in a home exercise program for continued strengthening and stretching    Time  8    Period  Weeks    Status  On-going      PT LONG TERM GOAL #2   Title  Pt will demonstrate 4/5 knee flexor strength to decrease risk of falls    Baseline  3/5    Time  8    Period  Weeks    Status  On-going      PT LONG TERM GOAL #3   Title  Pt will report 75% improvement in left knee pain to allow pt to complete her job using proper body mechanincs    Time  8    Period  Weeks    Status  On-going      PT LONG TERM GOAL #4   Title  Pt will demonstrate 4/5 bilateral ankle dorsiflexor strength to decrease risk of falls    Baseline  3/5    Time  8    Period  Weeks    Status  On-going      PT LONG TERM GOAL #5   Title  Pt will be able to complete 13 sit to stands in 30 seconds to decrease risk of falls    Baseline  7    Time  8    Period  Weeks    Status  On-going            Plan - 04/15/17 0859    Clinical Impression Statement  Patient had reduction of abdominal and upper thigh circumference after therapy due to her pants being loser after therapy. Patient had increased softness of her tissue in abdomen and upper thigh.  Patient was able to walk with with increased weightbear on the left and less knee pain.  Patient reported the pelvic floor feels more relaxed.  Patient will benefit from skilled therapy to reduce pain, improve tissue mobility and improve strength to reduce urinary leakage.     Rehab Potential  Good    Clinical Impairments Affecting Rehab Potential  pt with increased left knee and right shoulder pain    PT Frequency  2x / week    PT Duration  8 weeks    PT Treatment/Interventions  ADLs/Self Care Home Management;Electrical Stimulation;Neuromuscular re-education;Therapeutic exercise;Balance training;Manual techniques;Patient/family education;Therapeutic activities    PT Next Visit Plan  soft tissue work to perineal area and bilateral thighs  and hip stretches    PT Home Exercise Plan  progress as needed    Consulted and Agree with Plan of Care  Patient       Patient will benefit from skilled therapeutic intervention in order to improve the following deficits and impairments:  Decreased balance, Decreased endurance, Decreased mobility, Difficulty walking, Impaired sensation, Decreased strength, Pain, Postural dysfunction, Increased edema  Visit Diagnosis: Muscle weakness (generalized)  Unspecified lack of coordination     Problem List Patient Active Problem List   Diagnosis Date Noted  . Physical deconditioning 03/25/2017  . Obesity (BMI 30.0-34.9) 03/25/2017  . Drug-induced hyperglycemia 12/23/2016  . Port catheter in place 12/02/2016  . Dysuria 12/02/2016  . 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 Acute rhinitis 11/04/2016  . Goals of care, counseling/discussion 11/04/2016  . 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 Endometrial cancer ([place]) 08/24/2016    Earlie Counts, PT 04/15/17 9:08 AM   [facility] [address] [place], [place], [address] Phone: [phone]   Fax:  [phone]  Name: Danielle Guzman MRN: [id number] Date of Birth: 08-06-1965

## 2017-04-15 NOTE — Telephone Encounter (Signed)
Sure ok

## 2017-04-19 ENCOUNTER — Encounter: Payer: Self-pay | Admitting: Physical Therapy

## 2017-04-19 ENCOUNTER — Ambulatory Visit: Payer: BLUE CROSS/BLUE SHIELD | Attending: Hematology and Oncology | Admitting: Physical Therapy

## 2017-04-19 DIAGNOSIS — R293 Abnormal posture: Secondary | ICD-10-CM | POA: Insufficient documentation

## 2017-04-19 DIAGNOSIS — G8929 Other chronic pain: Secondary | ICD-10-CM | POA: Diagnosis present

## 2017-04-19 DIAGNOSIS — M25562 Pain in left knee: Secondary | ICD-10-CM | POA: Diagnosis present

## 2017-04-19 DIAGNOSIS — M25511 Pain in right shoulder: Secondary | ICD-10-CM | POA: Diagnosis present

## 2017-04-19 DIAGNOSIS — R262 Difficulty in walking, not elsewhere classified: Secondary | ICD-10-CM | POA: Insufficient documentation

## 2017-04-19 DIAGNOSIS — M6281 Muscle weakness (generalized): Secondary | ICD-10-CM | POA: Diagnosis not present

## 2017-04-19 NOTE — Therapy (Signed)
[place], [place], [address] Phone: [phone]   Fax:  [phone]  Physical Therapy Treatment  Patient Details  Name: Danielle Guzman MRN: [id number] Date of Birth: 08/16/1965 Referring Provider: Dr. Heath Lark   Encounter Date: 04/19/2017  PT End of Session - 04/19/17 0951    Visit Number  11    Number of Visits  30    Date for PT Re-Evaluation  05/24/17    Authorization Type  BCBS/ coinsurance 30 visit limit combine with PT/OT/Chiropractor    Authorization - Visit Number  11    Authorization - Number of Visits  30    PT Start Time  0848    PT Stop Time  0933    PT Time Calculation (min)  45 min    Activity Tolerance  Patient tolerated treatment well    Behavior During Therapy  [facility] for tasks assessed/performed       Past Medical History:  Diagnosis Date  . Cancer ([place]) 2018   endometrial   . Headache    hx migraines yrs ago  . Malaria as child  . Miscarriage    x 2  . Pneumonia yrs ago  . Seizures ([place])    petite mal seizure in high school x 1, none since    Past Surgical History:  Procedure Laterality Date  . DENTAL SURGERY     gum graft 20 yrs ago and again on 08/26/16  . DILATATION & CURETTAGE/HYSTEROSCOPY WITH MYOSURE N/A 08/11/2016   Procedure: DILATATION & CURETTAGE/HYSTEROSCOPY WITH MYOSURE;  Surgeon: Jerelyn Charles, MD;  Location: [facility];  Service: Gynecology;  Laterality: N/A;  polyp removal  . IR FLUORO GUIDE PORT INSERTION RIGHT  11/10/2016  . IR REMOVAL TUN ACCESS W/ PORT W/O FL MOD SED  03/11/2017  . IR US GUIDE VASC ACCESS RIGHT  11/10/2016  . LYMPH NODE BIOPSY N/A 09/01/2016   Procedure: SENTINEL LYMPH NODE BIOPSY;  Surgeon: Everitt Amber, MD;  [facility];  Service: Gynecology;  Laterality: N/A;  . ROBOTIC ASSISTED TOTAL HYSTERECTOMY WITH BILATERAL SALPINGO OOPHERECTOMY Bilateral 09/01/2016   Procedure: ROBOTIC ASSISTED TOTAL HYSTERECTOMY WITH BILATERAL SALPINGO  OOPHORECTOMY;  Surgeon: Everitt Amber, MD;  [facility];  Service: Gynecology;  Laterality: Bilateral;  . WISDOM TOOTH EXTRACTION      There were no vitals filed for this visit.  Subjective Assessment - 04/19/17 0852    Subjective  I feel good. I can tell they are helping. I am still having to rest in between exercises. The sidelying hip adduction is still hard for me. I am having longer times when my knee pain does not spike up to a 6 or 7.     Pertinent History  ovarian cancer with complete hysterectomy 09/01/16 and SLNB, completed chemotherapy on 02/23/17    Patient Stated Goals  to get stronger    Currently in Pain?  Yes    Pain Score  4     Pain Location  Knee    Pain Orientation  Left                      [facility] Adult PT Treatment/Exercise - 04/19/17 0001      High Level Balance   High Level Balance Activities  Side stepping;Braiding;Tandem walking heel/toe talking- stopped heel walkingdue to increasedkneep!      Knee/Hip Exercises: Standing   Other Standing Knee Exercises  runners stretch bilaterally with 30 sec holds  Knee/Hip Exercises: Seated   Long Arc Quad  Strengthening;Right;Left;10 reps;Weights;2 sets 5 sec holds    Long Arc Quad Weight  1 lbs.    Marching  Strengthening;Right;Left;10 reps;Weights    Marching Weights  1 lbs.      Knee/Hip Exercises: Supine   Quad Sets  Both;1 set;10 reps;Strengthening with 5 sec holds, 1 lb ankle weights    Short Arc Quad Sets  Strengthening;Both;1 set;10 reps with 1 lb weight    Heel Slides  Strengthening;Both;1 set;10 reps with 1 lb ankle weights    Bridges  Strengthening;1 set;10 reps with 3 sec holds    Straight Leg Raises  Strengthening;Both;10 reps with 1 lb weight               PT Short Term Goals - 04/19/17 0951      PT SHORT TERM GOAL #1   Title  Pt will be able to complete 9 sit to stands in 30 sec to decrease risk of falls    Baseline  7    Time  4    Period  Weeks    Status  On-going       PT SHORT TERM GOAL #2   Title  Pt will demonstrate 3+/5 hip flexor strength to decrease risk of falls when ambulating    Baseline  3/5    Time  4    Period  Weeks    Status  On-going      PT SHORT TERM GOAL #3   Title  Pt will report a 25% improvement in left knee pain to allow for improved mobility    Time  4    Period  Weeks    Status  On-going        PT Long Term Goals - 04/19/17 0950      PT LONG TERM GOAL #1   Title  Pt will be independent in a home exercise program for continued strengthening and stretching    Time  8    Period  Weeks    Status  On-going      PT LONG TERM GOAL #2   Title  Pt will demonstrate 4/5 knee flexor strength to decrease risk of falls    Baseline  3/5    Time  8    Period  Weeks    Status  On-going      PT LONG TERM GOAL #3   Title  Pt will report 75% improvement in left knee pain to allow pt to complete her job using proper body mechanincs    Time  8    Period  Weeks    Status  On-going      PT LONG TERM GOAL #4   Title  Pt will demonstrate 4/5 bilateral ankle dorsiflexor strength to decrease risk of falls    Baseline  3/5    Time  8    Period  Weeks    Status  On-going      PT LONG TERM GOAL #5   Title  Pt will be able to complete 13 sit to stands in 30 seconds to decrease risk of falls    Baseline  7    Time  8    Period  Weeks    Status  On-going            Plan - 04/19/17 1191    Clinical Impression Statement  Added standing balance exercises to HEP today. DId not do heel walking due to  increased knee pain. Overall pt states her knee pain is not spiking to a 6 as often as it used to. Continued seated and supine exercises with 1 lb ankle weights. Pt still feels weakness in her left knee with these.     Rehab Potential  Good    Clinical Impairments Affecting Rehab Potential  pt with increased left knee and right shoulder pain    PT Frequency  2x / week    PT Duration  8 weeks    PT Treatment/Interventions   ADLs/Self Care Home Management;Electrical Stimulation;Neuromuscular re-education;Therapeutic exercise;Balance training;Manual techniques;Patient/family education;Therapeutic activities    PT Next Visit Plan  continue standing, seated and supine exercises    Consulted and Agree with Plan of Care  Patient       Patient will benefit from skilled therapeutic intervention in order to improve the following deficits and impairments:  Decreased balance, Decreased endurance, Decreased mobility, Difficulty walking, Impaired sensation, Decreased strength, Pain, Postural dysfunction, Increased edema  Visit Diagnosis: Muscle weakness (generalized)  Chronic pain of left knee  Difficulty in walking, not elsewhere classified     Problem List Patient Active Problem List   Diagnosis Date Noted  . Physical deconditioning 03/25/2017  . Obesity (BMI 30.0-34.9) 03/25/2017  . Drug-induced hyperglycemia 12/23/2016  . Port catheter in place 12/02/2016  . Dysuria 12/02/2016  . 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 Acute rhinitis 11/04/2016  . Goals of care, counseling/discussion 11/04/2016  . 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 Endometrial cancer ([place]) 08/24/2016    Allyson Sabal [facility] 04/19/2017, 9:56 AM  [place], [place], [address] Phone: [phone]   Fax:  [phone]  Name: Danielle Guzman MRN: [id number] Date of Birth: 1965-10-06  Manus Gunning, PT 04/19/17 9:57 AM

## 2017-04-19 NOTE — Patient Instructions (Signed)
Standing balance exercises:  Heel/toe walking in a straight line  Walking on toes  Braiding- right foot crosses over left foot in front, then together, then behind- then repeat on left side  Side stepping with mini squat   Butterfly stretch in sitting

## 2017-04-20 ENCOUNTER — Encounter: Payer: Self-pay | Admitting: Physical Therapy

## 2017-04-20 ENCOUNTER — Ambulatory Visit: Payer: BLUE CROSS/BLUE SHIELD | Attending: Hematology and Oncology | Admitting: Physical Therapy

## 2017-04-20 DIAGNOSIS — M62838 Other muscle spasm: Secondary | ICD-10-CM | POA: Insufficient documentation

## 2017-04-20 DIAGNOSIS — R279 Unspecified lack of coordination: Secondary | ICD-10-CM | POA: Insufficient documentation

## 2017-04-20 DIAGNOSIS — M6281 Muscle weakness (generalized): Secondary | ICD-10-CM

## 2017-04-20 NOTE — Therapy (Signed)
[facility] [address] [place], [place], [address] Phone: [phone]   Fax:  [phone]  Physical Therapy Treatment  Patient Details  Name: Danielle Guzman MRN: [id number] Date of Birth: Jun 28, 1965 Referring Provider: Dr. Heath Lark   Encounter Date: 04/20/2017  PT End of Session - 04/20/17 1318    Visit Number  12    Date for PT Re-Evaluation  06/03/17    Authorization Type  BCBS/ coinsurance 30 visit limit combine with PT/OT/Chiropractor    Authorization - Visit Number  12    Authorization - Number of Visits  30    PT Start Time  1230    PT Stop Time  1315    PT Time Calculation (min)  45 min    Activity Tolerance  Patient tolerated treatment well    Behavior During Therapy  [place] for tasks assessed/performed       Past Medical History:  Diagnosis Date  . Cancer ([place]) 2018   endometrial   . Headache    hx migraines yrs ago  . Malaria as child  . Miscarriage    x 2  . Pneumonia yrs ago  . 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 DENTAL SURGERY     gum graft 20 yrs ago and again on 08/26/16  . DILATATION & CURETTAGE/HYSTEROSCOPY WITH MYOSURE N/A 08/11/2016   Procedure: DILATATION & CURETTAGE/HYSTEROSCOPY WITH MYOSURE;  Surgeon: Jerelyn Charles, MD;  Location: [facility];  Service: Gynecology;  Laterality: N/A;  polyp removal  . IR FLUORO GUIDE PORT INSERTION RIGHT  11/10/2016  . IR REMOVAL TUN ACCESS W/ PORT W/O FL MOD SED  03/11/2017  . IR US GUIDE VASC ACCESS RIGHT  11/10/2016  . LYMPH NODE BIOPSY N/A 09/01/2016   Procedure: SENTINEL LYMPH NODE BIOPSY;  Surgeon: Everitt Amber, MD;  Location: [facility];  Service: Gynecology;  Laterality: N/A;  . ROBOTIC ASSISTED TOTAL HYSTERECTOMY WITH BILATERAL SALPINGO OOPHERECTOMY Bilateral 09/01/2016   Procedure: ROBOTIC ASSISTED TOTAL HYSTERECTOMY WITH BILATERAL SALPINGO OOPHORECTOMY;  Surgeon:  Everitt Amber, MD;  Location: [facility];  Service: Gynecology;  Laterality: Bilateral;  . WISDOM TOOTH EXTRACTION      There were no vitals filed for this visit.  Subjective Assessment - 04/20/17 1232    Subjective  Pelvic floor is 2/10.  The pelvic floor pain gets less.  It has been a 1/10.  Has less deep ache in the pelvis and not as often.     Pertinent History  ovarian cancer with complete hysterectomy 09/01/16 and SLNB, completed chemotherapy on 02/23/17    Patient Stated Goals  to get stronger    Currently in Pain?  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 hip adductors, bil. levator ani, left pesancerine, right superficial transverse perineum               PT Short Term Goals - 04/19/17 0951      PT SHORT TERM GOAL #1   Title  Pt will be able to complete 9 sit to stands in 30 sec to decrease risk of falls    Baseline  7    Time  4    Period  Weeks    Status  On-going      PT SHORT TERM GOAL #2   Title  Pt will demonstrate 3+/5 hip flexor strength to decrease risk of falls when ambulating    Baseline  3/5    Time  4    Period  Weeks    Status  On-going      PT SHORT TERM GOAL #3   Title  Pt will report a 25% improvement in left knee pain to allow for improved mobility    Time  4    Period  Weeks    Status  On-going        PT Long Term  Goals - 04/19/17 0950      PT LONG TERM GOAL #1   Title  Pt will be independent in a home exercise program for continued strengthening and stretching    Time  8    Period  Weeks    Status  On-going      PT LONG TERM GOAL #2   Title  Pt will demonstrate 4/5 knee flexor strength to decrease risk of falls    Baseline  3/5    Time  8    Period  Weeks    Status  On-going      PT LONG TERM GOAL #3   Title  Pt will report 75% improvement in left knee pain to allow pt to complete her job using proper body mechanincs    Time  8    Period  Weeks    Status  On-going      PT LONG TERM GOAL #4   Title  Pt will demonstrate 4/5 bilateral ankle dorsiflexor strength to decrease risk of falls    Baseline  3/5    Time  8    Period  Weeks    Status  On-going      PT LONG TERM GOAL #5   Title  Pt will be able to complete 13 sit to stands in 30 seconds to decrease risk of falls    Baseline  7    Time  8    Period  Weeks    Status  On-going            Plan - 04/20/17 1318    Clinical Impression Statement  Patient has trigger points in her bil. hip adductors and levator ani. Patient reports decreased ache in her pelvis and not as intense. Patient was walking with a limp on the left. Patient reports less swelling in her abdomen and legs. Patient will benefit from skilled therapy to improve tissue mobility and improve strength to reduce urinary leakage.     Rehab Potential  Good    Clinical Impairments Affecting Rehab Potential  pt with increased left knee and right shoulder pain    PT Frequency  2x / week    PT Duration  8 weeks    PT Treatment/Interventions  ADLs/Self Care Home Management;Electrical Stimulation;Neuromuscular re-education;Therapeutic exercise;Balance training;Manual techniques;Patient/family education;Therapeutic activities    PT Next Visit Plan  soft tissue work around the perineum and intermanlly    PT Home Exercise Plan  progress as needed    Consulted and Agree with  Plan of Care  Patient       Patient will benefit from skilled therapeutic intervention in order to improve the following deficits and impairments:  Decreased balance, Decreased endurance, Decreased mobility, Difficulty walking, Impaired sensation, Decreased strength, Pain, Postural dysfunction, Increased edema  Visit Diagnosis: Muscle weakness (generalized)  Unspecified lack of coordination  Other muscle spasm     Problem List Patient Active Problem List   Diagnosis Date Noted  . Physical deconditioning 03/25/2017  . Obesity (BMI 30.0-34.9) 03/25/2017  . Drug-induced hyperglycemia 12/23/2016  . Port catheter in place 12/02/2016  . Dysuria 12/02/2016  . Peripheral neuropathy due to chemotherapy ([place]) 12/02/2016  . Acute rhinitis 11/04/2016  . Goals of care, counseling/discussion 11/04/2016  . 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 Endometrial cancer ([place]) 08/24/2016    Earlie Counts, PT 04/20/17 1:24 PM   [facility] [address], [place], [place], [address] Phone: [phone]   Fax:  [phone]  Name: Danielle Guzman MRN: [id number] Date of Birth: January 19, 1966

## 2017-04-21 ENCOUNTER — Ambulatory Visit: Payer: BLUE CROSS/BLUE SHIELD

## 2017-04-21 DIAGNOSIS — R262 Difficulty in walking, not elsewhere classified: Secondary | ICD-10-CM

## 2017-04-21 DIAGNOSIS — M25562 Pain in left knee: Secondary | ICD-10-CM

## 2017-04-21 DIAGNOSIS — G8929 Other chronic pain: Secondary | ICD-10-CM

## 2017-04-21 DIAGNOSIS — M6281 Muscle weakness (generalized): Secondary | ICD-10-CM | POA: Diagnosis not present

## 2017-04-21 NOTE — Therapy (Signed)
[place], [place], [address] Phone: [phone]   Fax:  [phone]  Physical Therapy Treatment  Patient Details  Name: Danielle Guzman MRN: [id number] Date of Birth: 1965-07-25 Referring Provider: Dr. Heath Lark   Encounter Date: 04/21/2017  PT End of Session - 04/21/17 1012    Visit Number  13    Number of Visits  30    Date for PT Re-Evaluation  06/03/17    Authorization Type  BCBS/ coinsurance 30 visit limit combine with PT/OT/Chiropractor    Authorization - Visit Number  13    Authorization - Number of Visits  30    PT Start Time  [id number]    PT Stop Time  0931    PT Time Calculation (min)  39 min    Activity Tolerance  Patient tolerated treatment well    Behavior During Therapy  [facility] for tasks assessed/performed       Past Medical History:  Diagnosis Date  . Cancer ([place]) 2018   endometrial   . Headache    hx migraines yrs ago  . Malaria as child  . Miscarriage    x 2  . Pneumonia yrs ago  . Seizures ([place])    petite mal seizure in high school x 1, none since    Past Surgical History:  Procedure Laterality Date  . DENTAL SURGERY     gum graft 20 yrs ago and again on 08/26/16  . DILATATION & CURETTAGE/HYSTEROSCOPY WITH MYOSURE N/A 08/11/2016   Procedure: DILATATION & CURETTAGE/HYSTEROSCOPY WITH MYOSURE;  Surgeon: Jerelyn Charles, MD;  Location: [facility];  Service: Gynecology;  Laterality: N/A;  polyp removal  . IR FLUORO GUIDE PORT INSERTION RIGHT  11/10/2016  . IR REMOVAL TUN ACCESS W/ PORT W/O FL MOD SED  03/11/2017  . IR US GUIDE VASC ACCESS RIGHT  11/10/2016  . LYMPH NODE BIOPSY N/A 09/01/2016   Procedure: SENTINEL LYMPH NODE BIOPSY;  Surgeon: Everitt Amber, MD;  Location: [facility];  Service: Gynecology;  Laterality: N/A;  . ROBOTIC ASSISTED TOTAL HYSTERECTOMY WITH BILATERAL SALPINGO OOPHERECTOMY Bilateral 09/01/2016   Procedure: ROBOTIC ASSISTED TOTAL HYSTERECTOMY WITH BILATERAL SALPINGO  OOPHORECTOMY;  Surgeon: Everitt Amber, MD;  Location: [facility];  Service: Gynecology;  Laterality: Bilateral;  . WISDOM TOOTH EXTRACTION      There were no vitals filed for this visit.  Subjective Assessment - 04/21/17 0900    Subjective  I'm getting better I can tell. My knee, though it does still hurt, is getting stronger. Stairs are getting a little easier as well, though still with pain, it has decreased.     Pertinent History  ovarian cancer with complete hysterectomy 09/01/16 and SLNB, completed chemotherapy on 02/23/17    Patient Stated Goals  to get stronger    Currently in Pain?  Yes    Pain Score  4  Joint pain in genereal 2/10    Pain Location  Knee    Pain Orientation  Left    Pain Descriptors / Indicators  Aching    Pain Type  Chronic pain    Pain Onset  More than a month ago    Pain Frequency  Constant    Aggravating Factors   doing alot of stairs; turning wrong    Pain Relieving Factors  advil                      [facility] Adult PT Treatment/Exercise - 04/21/17 0001  High Level Balance   High Level Balance Activities  Braiding;Tandem walking;Marching forwards    High Level Balance Comments  In hallway and with just SBA. Pt had no pain with any of these today, pt did very well and could note improvement.      Knee/Hip Exercises: Aerobic   Recumbent Bike  Level 1, 5 minutes      Knee/Hip Exercises: Standing   Hip Flexion  Stengthening;Right;Left;10 reps;Knee straight 1 lb each ankle on Airex at bike for +1 HHA    Hip Abduction  Stengthening;Right;Left;10 reps 1 lb each ankle on Airex at back of bike for +1 HHA    Hip Extension  AROM;Both;1 set;10 reps;Knee straight 1 lb each ankle on Airex at back of bike for +1 HHA      Knee/Hip Exercises: Seated   Long Arc Quad  Strengthening;Right;Left;10 reps;Weights;2 sets    [employer] Weight  1 lbs.    Marching  Strengthening;Right;Left;10 reps;Weights    Marching Limitations  1               PT  Short Term Goals - 04/19/17 0951      PT SHORT TERM GOAL #1   Title  Pt will be able to complete 9 sit to stands in 30 sec to decrease risk of falls    Baseline  7    Time  4    Period  Weeks    Status  On-going      PT SHORT TERM GOAL #2   Title  Pt will demonstrate 3+/5 hip flexor strength to decrease risk of falls when ambulating    Baseline  3/5    Time  4    Period  Weeks    Status  On-going      PT SHORT TERM GOAL #3   Title  Pt will report a 25% improvement in left knee pain to allow for improved mobility    Time  4    Period  Weeks    Status  On-going        PT Long Term Goals - 04/19/17 0950      PT LONG TERM GOAL #1   Title  Pt will be independent in a home exercise program for continued strengthening and stretching    Time  8    Period  Weeks    Status  On-going      PT LONG TERM GOAL #2   Title  Pt will demonstrate 4/5 knee flexor strength to decrease risk of falls    Baseline  3/5    Time  8    Period  Weeks    Status  On-going      PT LONG TERM GOAL #3   Title  Pt will report 75% improvement in left knee pain to allow pt to complete her job using proper body mechanincs    Time  8    Period  Weeks    Status  On-going      PT LONG TERM GOAL #4   Title  Pt will demonstrate 4/5 bilateral ankle dorsiflexor strength to decrease risk of falls    Baseline  3/5    Time  8    Period  Weeks    Status  On-going      PT LONG TERM GOAL #5   Title  Pt will be able to complete 13 sit to stands in 30 seconds to decrease risk of falls    Baseline  7    Time  8    Period  Weeks    Status  On-going            Plan - 04/21/17 1249    Clinical Impression Statement  Pt had noticeable improvement with therapy today. Progressed pt to include recumbent bike and standing hip strengtheing with 1 lb ankle weights and on Airex. Pt tolerated these very well and was able to tolerate tandem walking without increased knee pain today as well. Overall pt felt she coudl  see good improvement today with her knee pain being less limiting during session.    Rehab Potential  Good    Clinical Impairments Affecting Rehab Potential  pt with increased left knee and right shoulder pain    PT Frequency  2x / week    PT Duration  8 weeks    PT Treatment/Interventions  ADLs/Self Care Home Management;Electrical Stimulation;Neuromuscular re-education;Therapeutic exercise;Balance training;Manual techniques;Patient/family education;Therapeutic activities    PT Next Visit Plan  Continue balance and bil LE strength as Lt knee pain allows in sitting, standing, and supine.    Consulted and Agree with Plan of Care  Patient       Patient will benefit from skilled therapeutic intervention in order to improve the following deficits and impairments:     Visit Diagnosis: Muscle weakness (generalized)  Chronic pain of left knee  Difficulty in walking, not elsewhere classified     Problem List Patient Active Problem List   Diagnosis Date Noted  . Physical deconditioning 03/25/2017  . Obesity (BMI 30.0-34.9) 03/25/2017  . Drug-induced hyperglycemia 12/23/2016  . Port catheter in place 12/02/2016  . Dysuria 12/02/2016  . Peripheral neuropathy due to chemotherapy ([place]) 12/02/2016  . Acute rhinitis 11/04/2016  . Goals of care, counseling/discussion 11/04/2016  . 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 Endometrial cancer ([place]) 08/24/2016    Otelia Limes, PTA 04/21/2017, 12:56 PM  [place], [place], [address] Phone: [phone]   Fax:  [phone]  Name: Danielle Guzman MRN: [id number] Date of Birth: 1965/06/06

## 2017-04-26 ENCOUNTER — Ambulatory Visit: Payer: BLUE CROSS/BLUE SHIELD | Admitting: Physical Therapy

## 2017-04-26 ENCOUNTER — Encounter: Payer: Self-pay | Admitting: Physical Therapy

## 2017-04-26 DIAGNOSIS — M6281 Muscle weakness (generalized): Secondary | ICD-10-CM | POA: Diagnosis not present

## 2017-04-26 DIAGNOSIS — G8929 Other chronic pain: Secondary | ICD-10-CM

## 2017-04-26 DIAGNOSIS — M25562 Pain in left knee: Secondary | ICD-10-CM

## 2017-04-26 DIAGNOSIS — R262 Difficulty in walking, not elsewhere classified: Secondary | ICD-10-CM

## 2017-04-26 NOTE — Therapy (Signed)
[place], [place], [address] Phone: [phone]   Fax:  [phone]  Physical Therapy Treatment  Patient Details  Name: Danielle Guzman MRN: [id number] Date of Birth: [birth year] Referring Provider: Dr. Heath Lark   Encounter Date: 04/26/2017  PT End of Session - 04/26/17 0924    Visit Number  14    Number of Visits  30    Date for PT Re-Evaluation  05/24/17    Authorization Type  BCBS/ coinsurance 30 visit limit combine with PT/OT/Chiropractor    Authorization - Visit Number  14    Authorization - Number of Visits  30    PT Start Time  [phone] pt arrived late    PT Stop Time  0937    PT Time Calculation (min)  44 min    Activity Tolerance  Patient tolerated treatment well    Behavior During Therapy  [facility] for tasks assessed/performed       Past Medical History:  Diagnosis Date  . Cancer ([place]) 2018   endometrial   . Headache    hx migraines yrs ago  . Malaria as child  . Miscarriage    x 2  . Pneumonia yrs ago  . Seizures ([place])    petite mal seizure in high school x 1, none since    Past Surgical History:  Procedure Laterality Date  . DENTAL SURGERY     gum graft 20 yrs ago and again on 08/26/16  . DILATATION & CURETTAGE/HYSTEROSCOPY WITH MYOSURE N/A 08/11/2016   Procedure: DILATATION & CURETTAGE/HYSTEROSCOPY WITH MYOSURE;  Surgeon: Jerelyn Charles, MD;  Location: [facility];  Service: Gynecology;  Laterality: N/A;  polyp removal  . IR FLUORO GUIDE PORT INSERTION RIGHT  11/10/2016  . IR REMOVAL TUN ACCESS W/ PORT W/O FL MOD SED  03/11/2017  . IR US GUIDE VASC ACCESS RIGHT  11/10/2016  . LYMPH NODE BIOPSY N/A 09/01/2016   Procedure: SENTINEL LYMPH NODE BIOPSY;  Surgeon: Everitt Amber, MD;  Location: [facility];  Service: Gynecology;  Laterality: N/A;  . ROBOTIC ASSISTED TOTAL HYSTERECTOMY WITH BILATERAL SALPINGO OOPHERECTOMY Bilateral 09/01/2016   Procedure: ROBOTIC ASSISTED TOTAL HYSTERECTOMY WITH BILATERAL  SALPINGO OOPHORECTOMY;  Surgeon: Everitt Amber, MD;  [facility];  Service: Gynecology;  Laterality: Bilateral;  . WISDOM TOOTH EXTRACTION      There were no vitals filed for this visit.  Subjective Assessment - 04/26/17 0854    Subjective  I am feeling a little stronger. My knee is like a 3 now instead of a 4 or 5. Joint pain is more of a constant 1 instead of a constant 2.     Pertinent History  ovarian cancer with complete hysterectomy 09/01/16 and SLNB, completed chemotherapy on 02/23/17    Patient Stated Goals  to get stronger    Currently in Pain?  Yes    Pain Score  3     Pain Location  Knee    Pain Orientation  Left                      [facility] Adult PT Treatment/Exercise - 04/26/17 0001      Knee/Hip Exercises: Aerobic   Recumbent Bike  Level 2, 7 minutes      Knee/Hip Exercises: Standing   Hip Flexion  Stengthening;Right;Left;10 reps;Knee straight 2 lb each ankle on Airex at bike for +1 HHA    Hip Abduction  Stengthening;Right;Left;10 reps 2 lb each ankle on Airex at  back of bike for +1 HHA    Hip Extension  AROM;Both;1 set;10 reps;Knee straight 2 lb each ankle on Airex at back of bike for +1 HHA      Knee/Hip Exercises: Seated   Long Arc Quad  Strengthening;Right;Left;10 reps;Weights;2 sets    [employer] Weight  2 lbs.    Marching  Strengthening;Right;Left;10 reps;Weights    Marching Limitations  2      Knee/Hip Exercises: Supine   Short Arc Quad Sets  Strengthening;Both;1 set;10 reps with 2 lb weight    Straight Leg Raises  Strengthening;Both;10 reps with 2 lb weight               PT Short Term Goals - 04/19/17 0951      PT SHORT TERM GOAL #1   Title  Pt will be able to complete 9 sit to stands in 30 sec to decrease risk of falls    Baseline  7    Time  4    Period  Weeks    Status  On-going      PT SHORT TERM GOAL #2   Title  Pt will demonstrate 3+/5 hip flexor strength to decrease risk of falls when ambulating    Baseline  3/5     Time  4    Period  Weeks    Status  On-going      PT SHORT TERM GOAL #3   Title  Pt will report a 25% improvement in left knee pain to allow for improved mobility    Time  4    Period  Weeks    Status  On-going        PT Long Term Goals - 04/19/17 0950      PT LONG TERM GOAL #1   Title  Pt will be independent in a home exercise program for continued strengthening and stretching    Time  8    Period  Weeks    Status  On-going      PT LONG TERM GOAL #2   Title  Pt will demonstrate 4/5 knee flexor strength to decrease risk of falls    Baseline  3/5    Time  8    Period  Weeks    Status  On-going      PT LONG TERM GOAL #3   Title  Pt will report 75% improvement in left knee pain to allow pt to complete her job using proper body mechanincs    Time  8    Period  Weeks    Status  On-going      PT LONG TERM GOAL #4   Title  Pt will demonstrate 4/5 bilateral ankle dorsiflexor strength to decrease risk of falls    Baseline  3/5    Time  8    Period  Weeks    Status  On-going      PT LONG TERM GOAL #5   Title  Pt will be able to complete 13 sit to stands in 30 seconds to decrease risk of falls    Baseline  7    Time  8    Period  Weeks    Status  On-going            Plan - 04/26/17 6010    Clinical Impression Statement  Pt was able to tolerate increased weight with all exercises today. Progressed from 1 lb and 2 lb ankle weights in seated, supine and standing. Pt also  tolerated increased resisitance and time on the bike. She is having decreased knee and joint pain since starting physical therapy.     Rehab Potential  Good    Clinical Impairments Affecting Rehab Potential  pt with increased left knee and right shoulder pain    PT Frequency  2x / week    PT Duration  8 weeks    PT Treatment/Interventions  ADLs/Self Care Home Management;Electrical Stimulation;Neuromuscular re-education;Therapeutic exercise;Balance training;Manual techniques;Patient/family  education;Therapeutic activities    PT Next Visit Plan  Continue balance and bil LE strength as Lt knee pain allows in sitting, standing, and supine.    PT Home Exercise Plan  progress as needed    Consulted and Agree with Plan of Care  Patient       Patient will benefit from skilled therapeutic intervention in order to improve the following deficits and impairments:  Decreased balance, Decreased endurance, Decreased mobility, Difficulty walking, Impaired sensation, Decreased strength, Pain, Postural dysfunction, Increased edema  Visit Diagnosis: Muscle weakness (generalized)  Chronic pain of left knee  Difficulty in walking, not elsewhere classified     Problem List Patient Active Problem List   Diagnosis Date Noted  . Physical deconditioning 03/25/2017  . Obesity (BMI 30.0-34.9) 03/25/2017  . Drug-induced hyperglycemia 12/23/2016  . Port catheter in place 12/02/2016  . Dysuria 12/02/2016  . 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 Acute rhinitis 11/04/2016  . Goals of care, counseling/discussion 11/04/2016  . 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 Endometrial cancer ([place]) 08/24/2016    Allyson Sabal [facility] 04/26/2017, 12:26 PM  [place], [place], [address] Phone: [phone]   Fax:  [phone]  Name: Danielle Guzman MRN: [id number] Date of Birth: Mar 25, 1965  Manus Gunning, PT 04/26/17 12:26 PM

## 2017-04-27 ENCOUNTER — Ambulatory Visit: Payer: BLUE CROSS/BLUE SHIELD | Admitting: Physical Therapy

## 2017-04-27 ENCOUNTER — Encounter: Payer: Self-pay | Admitting: Physical Therapy

## 2017-04-27 DIAGNOSIS — R279 Unspecified lack of coordination: Secondary | ICD-10-CM

## 2017-04-27 DIAGNOSIS — M6281 Muscle weakness (generalized): Secondary | ICD-10-CM

## 2017-04-27 DIAGNOSIS — M62838 Other muscle spasm: Secondary | ICD-10-CM

## 2017-04-27 NOTE — Therapy (Signed)
[facility] [address], [place], [place], [address] Phone: [phone]   Fax:  [phone]  Physical Therapy Treatment  Patient Details  Name: Danielle Guzman MRN: [id number] Date of Birth: 26-Feb-1966 Referring Provider: Dr. Heath Lark   Encounter Date: 04/27/2017  PT End of Session - 04/27/17 1412    Visit Number  15    Number of Visits  30    Date for PT Re-Evaluation  05/24/17    Authorization Type  BCBS/ coinsurance 30 visit limit combine with PT/OT/Chiropractor    Authorization - Visit Number  15    Authorization - Number of Visits  30    PT Start Time  1400    PT Stop Time  2542    PT Time Calculation (min)  42 min    Activity Tolerance  Patient tolerated treatment well    Behavior During Therapy  [facility] for tasks assessed/performed       Past Medical History:  Diagnosis Date  . Cancer ([place]) 2018   endometrial   . Headache    hx migraines yrs ago  . Malaria as child  . Miscarriage    x 2  . Pneumonia yrs ago  . Seizures ([address] [place] [address])    petite mal seizure in high school x 1, none since    Past Surgical History:  Procedure Laterality Date  . DENTAL SURGERY     gum graft 20 yrs ago and again on 08/26/16  . DILATATION & CURETTAGE/HYSTEROSCOPY WITH MYOSURE N/A 08/11/2016   Procedure: DILATATION & CURETTAGE/HYSTEROSCOPY WITH MYOSURE;  Surgeon: Jerelyn Charles, MD;  Location: [facility];  Guzman: Gynecology;  Laterality: N/A;  polyp removal  . IR FLUORO GUIDE PORT INSERTION RIGHT  11/10/2016  . IR REMOVAL TUN ACCESS W/ PORT W/O FL MOD SED  03/11/2017  . IR US GUIDE VASC ACCESS RIGHT  11/10/2016  . LYMPH NODE BIOPSY N/A 09/01/2016   Procedure: SENTINEL LYMPH NODE BIOPSY;  Surgeon: Everitt Amber, MD;  Location: [facility];  Guzman: Gynecology;  Laterality: N/A;  . ROBOTIC ASSISTED TOTAL HYSTERECTOMY WITH BILATERAL SALPINGO OOPHERECTOMY Bilateral 09/01/2016   Procedure: ROBOTIC ASSISTED TOTAL HYSTERECTOMY WITH BILATERAL SALPINGO  OOPHORECTOMY;  Surgeon: Everitt Amber, MD;  Location: [facility];  Guzman: Gynecology;  Laterality: Bilateral;  . WISDOM TOOTH EXTRACTION      There were no vitals filed for this visit.  Subjective Assessment - 04/27/17 1408    Subjective  I have not had intercourse my husband was out of town. My left knee is feeling better. I still have an ache in the pelvic floor but not as high and down to 1-2/10.     Pertinent History  ovarian cancer with complete hysterectomy 09/01/16 and SLNB, completed chemotherapy on 02/23/17    Patient Stated Goals  to get stronger    Currently in Pain?  Yes    Pain Score  2     Pain Location  Knee    Pain Orientation  Left    Pain Descriptors / Indicators  Aching    Pain Type  Chronic pain    Pain Onset  More than a month ago    Pain Frequency  Constant    Aggravating Factors   doing stairs and walking on an angle to the left    Pain Relieving Factors  advil    Multiple Pain Sites  Yes    Pain Score  1    Pain Location  Pelvis    Pain  Orientation  Mid    Pain Descriptors / Indicators  Aching    Pain Type  Chronic pain    Pain Onset  More than a month ago    Pain Frequency  Constant    Aggravating Factors   not sure    Pain Relieving Factors  hot tub                   Pelvic Floor Special Questions - 04/27/17 0001    Pelvic Floor Internal Exam  Patient confirms indentification and approves PT to assess pelvic floor integrity and treatment    Exam Type  Vaginal    Palpation  dryness    Strength  fair squeeze, definite lift        [facility] Adult PT Treatment/Exercise - 04/27/17 0001      Manual Therapy   Manual Therapy  Soft tissue mobilization;Myofascial release;Internal Pelvic Floor    Soft tissue mobilization  ischiocavernosus and right hip adductor    Myofascial Release  around the superficial transverse perineum and transverse perineum going through planes of fascia    Internal Pelvic Floor  on finger internally and one externally to  release the right bulbocavernsosu and superficial transverse perineum and perineal body             PT Education - 04/27/17 1440    Education provided  Yes    Education Details  information on vaginal moisturizers    Person(s) Educated  Patient    Methods  Explanation;Handout    Comprehension  Verbalized understanding       PT Short Term Goals - 04/27/17 1448      PT SHORT TERM GOAL #4   Title  Deep ache in pelvic area durining daily tasks decreased >/= 25% due to improve pelvic floor muscle tissue mobility    Time  8    Period  Weeks    Status  Achieved        PT Long Term Goals - 04/19/17 0950      PT LONG TERM GOAL #1   Title  Pt will be independent in a home exercise program for continued strengthening and stretching    Time  8    Period  Weeks    Status  On-going      PT LONG TERM GOAL #2   Title  Pt will demonstrate 4/5 knee flexor strength to decrease risk of falls    Baseline  3/5    Time  8    Period  Weeks    Status  On-going      PT LONG TERM GOAL #3   Title  Pt will report 75% improvement in left knee pain to allow pt to complete her job using proper body mechanincs    Time  8    Period  Weeks    Status  On-going      PT LONG TERM GOAL #4   Title  Pt will demonstrate 4/5 bilateral ankle dorsiflexor strength to decrease risk of falls    Baseline  3/5    Time  8    Period  Weeks    Status  On-going      PT LONG TERM GOAL #5   Title  Pt will be able to complete 13 sit to stands in 30 seconds to decrease risk of falls    Baseline  7    Time  8    Period  Weeks    Status  On-going  Plan - 04/27/17 1445    Clinical Impression Statement  Patient has improve tissue mobility after soft tissue work.  Patient reports the pelvic ache is not as strong and more isolated.  Patient still leaks urine with movement.  Patient understands what vaginal moisturizers are and how it will help her dryness.  Patient has not had intercourse yet but  will try next week.  Patient has less pain in left knee.  Patient will benefit from skilled therapy to reduce pain and improve tissue mobitliy to improve pelvic floor strength and function.     Rehab Potential  Excellent    Clinical Impairments Affecting Rehab Potential  pt with increased left knee and right shoulder pain    PT Frequency  2x / week    PT Duration  8 weeks    PT Treatment/Interventions  ADLs/Self Care Home Management;Electrical Stimulation;Neuromuscular re-education;Therapeutic exercise;Balance training;Manual techniques;Patient/family education;Therapeutic activities    PT Next Visit Plan  work on left side of pelvic floor muscles    PT Home Exercise Plan  progress as needed    Consulted and Agree with Plan of Care  Patient       Patient will benefit from skilled therapeutic intervention in order to improve the following deficits and impairments:  Decreased balance, Decreased endurance, Decreased mobility, Difficulty walking, Impaired sensation, Decreased strength, Pain, Postural dysfunction, Increased edema, Increased muscle spasms, Increased fascial restricitons  Visit Diagnosis: Muscle weakness (generalized)  Unspecified lack of coordination  Other muscle spasm     Problem List Patient Active Problem List   Diagnosis Date Noted  . Physical deconditioning 03/25/2017  . Obesity (BMI 30.0-34.9) 03/25/2017  . Drug-induced hyperglycemia 12/23/2016  . Port catheter in place 12/02/2016  . Dysuria 12/02/2016  . 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 Acute rhinitis 11/04/2016  . Goals of care, counseling/discussion 11/04/2016  . 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 Endometrial cancer ([place]) 08/24/2016    Earlie Counts, PT 04/27/17 2:49 PM   [facility] [address] [place], [place], [address] Phone: [phone]   Fax:  [phone]  Name: Danielle Guzman MRN: [id number] Date of Birth: 31-Dec-1965

## 2017-04-27 NOTE — Patient Instructions (Signed)
Moisturizers . They are used in the vagina to hydrate the mucous membrane that make up the vaginal canal. . Designed to keep a more normal acid balance (ph) . Once placed in the vagina, it will last between two to three days.  . Use 2-3 times per week at bedtime and last longer than 60 min. . Ingredients to avoid is glycerin and fragrance, can increase chance of infection . Should not be used just before sex due to causing irritation . Most are gels administered either in a tampon-shaped applicator or as a vaginal suppository. They are non-hormonal.   Types of Moisturizers . Samul Dada- drug store . Vitamin E vaginal suppositories- Whole foods, Amazon . Moist Again . Coconut oil- can break down condoms . Michail Jewels . Yes moisturizer- amazon . NeuEve Silk , NeuEve Silver for menopausal or over 65 (if have severe vaginal atrophy or cancer treatments use NeuEve Silk for  1 month than move to [employer])- [employer], MapleFlower.dk . Olive and Bee intimate cream- www.oliveandbee.com.au  Creams to use externally on the Vulva area  Albertson's (good for for cancer patients that had radiation to the area)- [place] or [employer].FlyingBasics.com.br  V-magic cream - amazon  Julva-amazon  Vital "V Wild Yam salve ( help moisturize and help with thinning vulvar area, does have [place]   Things to avoid in the vaginal area . Do not use things to irritate the vulvar area . No lotions just specialized creams for the vulva area- Neogyn, V-magic, No soaps; can use Aveeno or Calendula cleanser if needed. Must be gentle . No deodorants . No douches . Good to sleep without underwear to let the vaginal area to air out . No scrubbing: spread the lips to let warm water rinse over labias and pat dry [facility] [address], [place], [place] [address] Phone # [phone] Fax [phone]

## 2017-04-28 ENCOUNTER — Ambulatory Visit: Payer: BLUE CROSS/BLUE SHIELD | Admitting: Physical Therapy

## 2017-04-28 ENCOUNTER — Encounter: Payer: Self-pay | Admitting: Physical Therapy

## 2017-04-28 DIAGNOSIS — M6281 Muscle weakness (generalized): Secondary | ICD-10-CM

## 2017-04-28 DIAGNOSIS — G8929 Other chronic pain: Secondary | ICD-10-CM

## 2017-04-28 DIAGNOSIS — R262 Difficulty in walking, not elsewhere classified: Secondary | ICD-10-CM

## 2017-04-28 DIAGNOSIS — M25562 Pain in left knee: Secondary | ICD-10-CM

## 2017-04-28 NOTE — Therapy (Signed)
[place], [place], [address] Phone: [phone]   Fax:  [phone]  Physical Therapy Treatment  Patient Details  Name: Danielle Guzman MRN: [id number] Date of Birth: [birth year] Referring Provider: Dr. Heath Lark   Encounter Date: 04/28/2017  PT End of Session - 04/28/17 1213    Visit Number  16    Number of Visits  30    Date for PT Re-Evaluation  05/24/17    Authorization Type  BCBS/ coinsurance 30 visit limit combine with PT/OT/Chiropractor    Authorization - Visit Number  16    Authorization - Number of Visits  30    PT Start Time  0850    PT Stop Time  0932    PT Time Calculation (min)  42 min    Activity Tolerance  Patient tolerated treatment well    Behavior During Therapy  [facility] for tasks assessed/performed       Past Medical History:  Diagnosis Date  . Cancer ([place]) 2018   endometrial   . Headache    hx migraines yrs ago  . Malaria as child  . Miscarriage    x 2  . Pneumonia yrs ago  . Seizures ([place])    petite mal seizure in high school x 1, none since    Past Surgical History:  Procedure Laterality Date  . DENTAL SURGERY     gum graft 20 yrs ago and again on 08/26/16  . DILATATION & CURETTAGE/HYSTEROSCOPY WITH MYOSURE N/A 08/11/2016   Procedure: DILATATION & CURETTAGE/HYSTEROSCOPY WITH MYOSURE;  Surgeon: Jerelyn Charles, MD;  Location: [facility];  Service: Gynecology;  Laterality: N/A;  polyp removal  . IR FLUORO GUIDE PORT INSERTION RIGHT  11/10/2016  . IR REMOVAL TUN ACCESS W/ PORT W/O FL MOD SED  03/11/2017  . IR US GUIDE VASC ACCESS RIGHT  11/10/2016  . LYMPH NODE BIOPSY N/A 09/01/2016   Procedure: SENTINEL LYMPH NODE BIOPSY;  Surgeon: Everitt Amber, MD;  Location: [facility];  Service: Gynecology;  Laterality: N/A;  . ROBOTIC ASSISTED TOTAL HYSTERECTOMY WITH BILATERAL SALPINGO OOPHERECTOMY Bilateral 09/01/2016   Procedure: ROBOTIC ASSISTED TOTAL HYSTERECTOMY WITH BILATERAL SALPINGO  OOPHORECTOMY;  Surgeon: Everitt Amber, MD;  Location: [facility];  Service: Gynecology;  Laterality: Bilateral;  . WISDOM TOOTH EXTRACTION      There were no vitals filed for this visit.  Subjective Assessment - 04/28/17 0851    Subjective  I can really tell a difference when using the 2lb weight. The 2lb weight challenging.     Pertinent History  ovarian cancer with complete hysterectomy 09/01/16 and SLNB, completed chemotherapy on 02/23/17    Patient Stated Goals  to get stronger    Currently in Pain?  Yes    Pain Score  2     Pain Location  Knee    Pain Orientation  Left                      [facility] Adult PT Treatment/Exercise - 04/28/17 0001      Knee/Hip Exercises: Aerobic   Recumbent Bike  Level 2, 8 minutes - Rating of perceived exertion 7/10      Knee/Hip Exercises: Standing   Hip Flexion  Stengthening;Right;Left;10 reps;Knee straight 2 lb each ankle on Airex at bike for +1 HHA    Hip Abduction  Stengthening;Right;Left;10 reps 2 lb each ankle on Airex at back of bike for +1 HHA    Hip Extension  Both;1 set;10  reps;Knee straight;Stengthening 2 lb each ankle on Airex at back of bike for +1 HHA      Knee/Hip Exercises: Seated   Long Arc Quad  Strengthening;Right;Left;Weights;2 sets;20 reps    Long Arc Quad Weight  2 lbs.    Ball Squeeze  20x with 5 sec holds with bolster    Marching  Strengthening;Right;Left;Weights;20 reps    Marching Limitations  2             PT Education - 04/27/17 1440    Education provided  Yes    Education Details  information on vaginal moisturizers    Person(s) Educated  Patient    Methods  Explanation;Handout    Comprehension  Verbalized understanding       PT Short Term Goals - 04/28/17 0854      PT SHORT TERM GOAL #1   Title  Pt will be able to complete 9 sit to stands in 30 sec to decrease risk of falls    Baseline  7, 04/28/17- 9    Time  4    Period  Weeks    Status  Achieved      PT SHORT TERM GOAL #2   Title  Pt  will demonstrate 3+/5 hip flexor strength to decrease risk of falls when ambulating    Baseline  3/5, 04/28/17- 3/5    Time  4    Period  Weeks    Status  On-going      PT SHORT TERM GOAL #3   Title  Pt will report a 25% improvement in left knee pain to allow for improved mobility    Baseline  04/28/17- 50% improvement    Time  4    Period  Weeks    Status  Achieved        PT Long Term Goals - 04/28/17 8413      PT LONG TERM GOAL #1   Title  Pt will be independent in a home exercise program for continued strengthening and stretching    Time  8    Period  Weeks    Status  On-going      PT LONG TERM GOAL #2   Title  Pt will demonstrate 4/5 knee flexor strength to decrease risk of falls    Baseline  3/5, 04/28/17- 3/5    Time  8    Period  Weeks    Status  On-going      PT LONG TERM GOAL #3   Title  Pt will report 75% improvement in left knee pain to allow pt to complete her job using proper body mechanincs    Baseline  04/28/17- 50% improvement    Time  8    Period  Weeks    Status  On-going      PT LONG TERM GOAL #4   Title  Pt will demonstrate 4/5 bilateral ankle dorsiflexor strength to decrease risk of falls    Baseline  3/5, 04/28/17- R 4/5, L 3/5    Period  Weeks    Status  On-going      PT LONG TERM GOAL #5   Title  Pt will be able to complete 13 sit to stands in 30 seconds to decrease risk of falls    Baseline  7, 04/28/17- 9     Time  8    Period  Weeks    Status  On-going            Plan - 04/28/17 2440  Clinical Impression Statement  Pt is making excellent progress in therapy. She has increased her home exercise program to include 2 lb weights as well. She stated yesterday was the first time she was able to get out of the car without having to lift her legs over the threshold. She still becomes fatigued easily and had to take several recovery periods in between exercises today and her rating of perceived exertion after using the bike was 7/10 after 8  minutes.     Rehab Potential  Excellent    Clinical Impairments Affecting Rehab Potential  pt with increased left knee and right shoulder pain    PT Frequency  2x / week    PT Duration  8 weeks    PT Treatment/Interventions  ADLs/Self Care Home Management;Electrical Stimulation;Neuromuscular re-education;Therapeutic exercise;Balance training;Manual techniques;Patient/family education;Therapeutic activities    PT Next Visit Plan  continue LE strengthening     Consulted and Agree with Plan of Care  Patient       Patient will benefit from skilled therapeutic intervention in order to improve the following deficits and impairments:  Decreased balance, Decreased endurance, Decreased mobility, Difficulty walking, Impaired sensation, Decreased strength, Pain, Postural dysfunction, Increased edema, Increased muscle spasms, Increased fascial restricitons  Visit Diagnosis: Muscle weakness (generalized)  Chronic pain of left knee  Difficulty in walking, not elsewhere classified     Problem List Patient Active Problem List   Diagnosis Date Noted  . Physical deconditioning 03/25/2017  . Obesity (BMI 30.0-34.9) 03/25/2017  . Drug-induced hyperglycemia 12/23/2016  . Port catheter in place 12/02/2016  . Dysuria 12/02/2016  . 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 Acute rhinitis 11/04/2016  . Goals of care, counseling/discussion 11/04/2016  . 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 Endometrial cancer ([place]) 08/24/2016    [facility] 04/28/2017, 12:14 PM  [place], [place], [address] Phone: [phone]   Fax:  [phone]  Name: AOLANI PIGGOTT MRN: [id number] Date of Birth: 04/08/1965  Manus Gunning, PT 04/28/17 12:14 PM

## 2017-04-29 ENCOUNTER — Ambulatory Visit: Payer: BLUE CROSS/BLUE SHIELD | Admitting: Physical Therapy

## 2017-04-29 ENCOUNTER — Encounter: Payer: Self-pay | Admitting: Physical Therapy

## 2017-04-29 DIAGNOSIS — R279 Unspecified lack of coordination: Secondary | ICD-10-CM

## 2017-04-29 DIAGNOSIS — M6281 Muscle weakness (generalized): Secondary | ICD-10-CM | POA: Diagnosis not present

## 2017-04-29 DIAGNOSIS — M62838 Other muscle spasm: Secondary | ICD-10-CM

## 2017-04-29 NOTE — Therapy (Signed)
[facility] [address] [place], [place], [address] Phone: [phone]   Fax:  [phone]  Physical Therapy Treatment  Patient Details  Name: Danielle Guzman MRN: [id number] Date of Birth: March 24, 1965 Referring Provider: Dr. Heath Lark   Encounter Date: 04/29/2017  PT End of Session - 04/29/17 0931    Visit Number  17    Number of Visits  30    Date for PT Re-Evaluation  05/24/17    Authorization Type  BCBS/ coinsurance 30 visit limit combine with PT/OT/Chiropractor    Authorization - Visit Number  17    Authorization - Number of Visits  30    PT Start Time  0845    PT Stop Time  0928    PT Time Calculation (min)  43 min    Activity Tolerance  Patient tolerated treatment well    Behavior During Therapy  [facility] for tasks assessed/performed       Past Medical History:  Diagnosis Date  . Cancer ([place]) 2018   endometrial   . Headache    hx migraines yrs ago  . Malaria as child  . Miscarriage    x 2  . Pneumonia yrs ago  . 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 DENTAL SURGERY     gum graft 20 yrs ago and again on 08/26/16  . DILATATION & CURETTAGE/HYSTEROSCOPY WITH MYOSURE N/A 08/11/2016   Procedure: DILATATION & CURETTAGE/HYSTEROSCOPY WITH MYOSURE;  Surgeon: Jerelyn Charles, MD;  Location: [facility];  Service: Gynecology;  Laterality: N/A;  polyp removal  . IR FLUORO GUIDE PORT INSERTION RIGHT  11/10/2016  . IR REMOVAL TUN ACCESS W/ PORT W/O FL MOD SED  03/11/2017  . IR US GUIDE VASC ACCESS RIGHT  11/10/2016  . LYMPH NODE BIOPSY N/A 09/01/2016   Procedure: SENTINEL LYMPH NODE BIOPSY;  Surgeon: Everitt Amber, MD;  Location: [facility];  Service: Gynecology;  Laterality: N/A;  . ROBOTIC ASSISTED TOTAL HYSTERECTOMY WITH BILATERAL SALPINGO OOPHERECTOMY Bilateral 09/01/2016   Procedure: ROBOTIC ASSISTED TOTAL HYSTERECTOMY WITH BILATERAL SALPINGO  OOPHORECTOMY;  Surgeon: Everitt Amber, MD;  Location: [facility];  Service: Gynecology;  Laterality: Bilateral;  . WISDOM TOOTH EXTRACTION      There were no vitals filed for this visit.  Subjective Assessment - 04/29/17 0846    Subjective  My  pelvic ache is a solid 1/10 Three times it went to a 3/10.  After last session for the pelvis, patient is able to get in and out to the car with significant decrease in pain.     Pertinent History  ovarian cancer with complete hysterectomy 09/01/16 and SLNB, completed chemotherapy on 02/23/17    Patient Stated Goals  to get stronger    Currently in Pain?  Yes    Pain Score  1     Pain Location  Vagina    Pain Orientation  Mid    Pain Descriptors / Indicators  Aching    Pain Type  Chronic pain    Pain Onset  More than a month ago    Pain Frequency  Constant    Aggravating Factors   getting in and out of car, squats, turning in bed,     Pain Relieving Factors  rest    Multiple Pain Sites  No  Pelvic Floor Special Questions - 04/29/17 0001    Pelvic Floor Internal Exam  Patient confirms indentification and approves PT to assess pelvic floor integrity and treatment    Exam Type  Vaginal    Strength  fair squeeze, definite lift        [facility] Adult PT Treatment/Exercise - 04/29/17 0001      Manual Therapy   Manual Therapy  Soft tissue mobilization;Myofascial release;Internal Pelvic Floor    Soft tissue mobilization  left hip adductor, left gracilis, left ishciocavernsous, along suprapubic where the abdominals attatch    Myofascial Release  right lower abdominal by inguinal canal    Internal Pelvic Floor  on finger internally and other externally to release the buoblcavernosus, obturator internist, puborectalis and urethra sphincter on left               PT Short Term Goals - 04/28/17 0854      PT SHORT TERM GOAL #1   Title  Pt will be able to complete 9 sit to stands in 30 sec to decrease risk of falls     Baseline  7, 04/28/17- 9    Time  4    Period  Weeks    Status  Achieved      PT SHORT TERM GOAL #2   Title  Pt will demonstrate 3+/5 hip flexor strength to decrease risk of falls when ambulating    Baseline  3/5, 04/28/17- 3/5    Time  4    Period  Weeks    Status  On-going      PT SHORT TERM GOAL #3   Title  Pt will report a 25% improvement in left knee pain to allow for improved mobility    Baseline  04/28/17- 50% improvement    Time  4    Period  Weeks    Status  Achieved        PT Long Term Goals - 04/28/17 0240      PT LONG TERM GOAL #1   Title  Pt will be independent in a home exercise program for continued strengthening and stretching    Time  8    Period  Weeks    Status  On-going      PT LONG TERM GOAL #2   Title  Pt will demonstrate 4/5 knee flexor strength to decrease risk of falls    Baseline  3/5, 04/28/17- 3/5    Time  8    Period  Weeks    Status  On-going      PT LONG TERM GOAL #3   Title  Pt will report 75% improvement in left knee pain to allow pt to complete her job using proper body mechanincs    Baseline  04/28/17- 50% improvement    Time  8    Period  Weeks    Status  On-going      PT LONG TERM GOAL #4   Title  Pt will demonstrate 4/5 bilateral ankle dorsiflexor strength to decrease risk of falls    Baseline  3/5, 04/28/17- R 4/5, L 3/5    Period  Weeks    Status  On-going      PT LONG TERM GOAL #5   Title  Pt will be able to complete 13 sit to stands in 30 seconds to decrease risk of falls    Baseline  7, 04/28/17- 9     Time  8    Period  Weeks  Status  On-going            Plan - 04/29/17 0849    Clinical Impression Statement  Patient is able to lift right  leg into the car without assisting it and less pain for first time.  Patient reports consistent pain level at 1/10 and several times 3/10 instead of higher.  Pelvic floor strength is 3/10 with circular contraction.  Patient will benefit from skilled therapy to reduce pelvic pain  by increase tissue mobility and improve strength.     Rehab Potential  Excellent    Clinical Impairments Affecting Rehab Potential  pt with increased left knee and right shoulder pain    PT Frequency  2x / week    PT Duration  8 weeks    PT Treatment/Interventions  ADLs/Self Care Home Management;Electrical Stimulation;Neuromuscular re-education;Therapeutic exercise;Balance training;Manual techniques;Patient/family education;Therapeutic activities;Biofeedback    PT Next Visit Plan  soft tissue work suprapubically, hip adductors and internally    PT Home Exercise Plan  progress as needed    Consulted and Agree with Plan of Care  Patient       Patient will benefit from skilled therapeutic intervention in order to improve the following deficits and impairments:  Decreased balance, Decreased endurance, Decreased mobility, Difficulty walking, Impaired sensation, Decreased strength, Pain, Postural dysfunction, Increased edema, Increased muscle spasms, Increased fascial restricitons  Visit Diagnosis: Muscle weakness (generalized)  Unspecified lack of coordination  Other muscle spasm     Problem List Patient Active Problem List   Diagnosis Date Noted  . Physical deconditioning 03/25/2017  . Obesity (BMI 30.0-34.9) 03/25/2017  . Drug-induced hyperglycemia 12/23/2016  . Port catheter in place 12/02/2016  . Dysuria 12/02/2016  . Peripheral neuropathy due to chemotherapy ([place]) 12/02/2016  . Acute rhinitis 11/04/2016  . Goals of care, counseling/discussion 11/04/2016  . 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 Endometrial cancer ([place]) 08/24/2016    Earlie Counts, PT 04/29/17 9:35 AM    [facility] [address], [place], [place], [address] Phone: [phone]   Fax:  [phone]  Name: Danielle Guzman MRN: [id number] Date of Birth: 1965-06-08

## 2017-05-03 ENCOUNTER — Encounter: Payer: Self-pay | Admitting: Physical Therapy

## 2017-05-03 ENCOUNTER — Ambulatory Visit: Payer: BLUE CROSS/BLUE SHIELD | Admitting: Physical Therapy

## 2017-05-03 DIAGNOSIS — M25562 Pain in left knee: Secondary | ICD-10-CM

## 2017-05-03 DIAGNOSIS — G8929 Other chronic pain: Secondary | ICD-10-CM

## 2017-05-03 DIAGNOSIS — M6281 Muscle weakness (generalized): Secondary | ICD-10-CM | POA: Diagnosis not present

## 2017-05-03 DIAGNOSIS — R293 Abnormal posture: Secondary | ICD-10-CM

## 2017-05-03 DIAGNOSIS — R262 Difficulty in walking, not elsewhere classified: Secondary | ICD-10-CM

## 2017-05-03 DIAGNOSIS — M25511 Pain in right shoulder: Secondary | ICD-10-CM

## 2017-05-03 NOTE — Therapy (Signed)
[place], [place], [address] Phone: [phone]   Fax:  [phone]  Physical Therapy Treatment  Patient Details  Name: Danielle Guzman MRN: [id number] Date of Birth: January 11, 1966 Referring Provider: Dr. Heath Lark   Encounter Date: 05/03/2017  PT End of Session - 05/03/17 0929    Visit Number  18    Number of Visits  30    Date for PT Re-Evaluation  05/24/17    Authorization Type  BCBS/ coinsurance 30 visit limit combine with PT/OT/Chiropractor    Authorization - Visit Number  18    Authorization - Number of Visits  30    PT Start Time  0850    PT Stop Time  0934    PT Time Calculation (min)  44 min    Activity Tolerance  Patient tolerated treatment well    Behavior During Therapy  [facility] for tasks assessed/performed       Past Medical History:  Diagnosis Date  . Cancer ([place]) 2018   endometrial   . Headache    hx migraines yrs ago  . Malaria as child  . Miscarriage    x 2  . Pneumonia yrs ago  . Seizures ([place])    petite mal seizure in high school x 1, none since    Past Surgical History:  Procedure Laterality Date  . DENTAL SURGERY     gum graft 20 yrs ago and again on 08/26/16  . DILATATION & CURETTAGE/HYSTEROSCOPY WITH MYOSURE N/A 08/11/2016   Procedure: DILATATION & CURETTAGE/HYSTEROSCOPY WITH MYOSURE;  Surgeon: Jerelyn Charles, MD;  Location: [facility];  Service: Gynecology;  Laterality: N/A;  polyp removal  . IR FLUORO GUIDE PORT INSERTION RIGHT  11/10/2016  . IR REMOVAL TUN ACCESS W/ PORT W/O FL MOD SED  03/11/2017  . IR US GUIDE VASC ACCESS RIGHT  11/10/2016  . LYMPH NODE BIOPSY N/A 09/01/2016   Procedure: SENTINEL LYMPH NODE BIOPSY;  Surgeon: Everitt Amber, MD;  Location: [facility];  Service: Gynecology;  Laterality: N/A;  . ROBOTIC ASSISTED TOTAL HYSTERECTOMY WITH BILATERAL SALPINGO OOPHERECTOMY Bilateral 09/01/2016   Procedure: ROBOTIC ASSISTED TOTAL HYSTERECTOMY WITH BILATERAL SALPINGO  OOPHORECTOMY;  Surgeon: Everitt Amber, MD;  Location: [facility];  Service: Gynecology;  Laterality: Bilateral;  . WISDOM TOOTH EXTRACTION      There were no vitals filed for this visit.  Subjective Assessment - 05/03/17 0852    Subjective  My knee is a steady 2. It was a 3 yesterday. My feet are worse.     Pertinent History  ovarian cancer with complete hysterectomy 09/01/16 and SLNB, completed chemotherapy on 02/23/17    Patient Stated Goals  to get stronger    Currently in Pain?  Yes    Pain Score  2     Pain Location  Knee    Pain Orientation  Left    Pain Descriptors / Indicators  Aching    Pain Score  1    Pain Location  -- joints in general                      [facility] Adult PT Treatment/Exercise - 05/03/17 0001      Neuro Re-ed    Neuro Re-ed Details   standing on Airex and marching x 10 reps, mini squats on airex keeping even weight through entire foot with verbal cues not to pull toes up, standing on 1 leg on airex x 15 sec bilaterally, standing 3  way hip with no added resistance on airex x 10 reps each      Knee/Hip Exercises: Aerobic   Recumbent Bike  Level 2, 8 minutes - Rating of perceived exertion 7/10      Knee/Hip Exercises: Seated   Long Arc Quad  Strengthening;Right;Left;Weights;1 set;10 reps    Long Arc Quad Weight  3 lbs.      Shoulder Exercises: Standing   Other Standing Exercises  3 way shoulder while standing on AirEx using 2lb weights x 10 reps               PT Short Term Goals - 04/28/17 0854      PT SHORT TERM GOAL #1   Title  Pt will be able to complete 9 sit to stands in 30 sec to decrease risk of falls    Baseline  7, 04/28/17- 9    Time  4    Period  Weeks    Status  Achieved      PT SHORT TERM GOAL #2   Title  Pt will demonstrate 3+/5 hip flexor strength to decrease risk of falls when ambulating    Baseline  3/5, 04/28/17- 3/5    Time  4    Period  Weeks    Status  On-going      PT SHORT TERM GOAL #3   Title  Pt will  report a 25% improvement in left knee pain to allow for improved mobility    Baseline  04/28/17- 50% improvement    Time  4    Period  Weeks    Status  Achieved        PT Long Term Goals - 04/28/17 7253      PT LONG TERM GOAL #1   Title  Pt will be independent in a home exercise program for continued strengthening and stretching    Time  8    Period  Weeks    Status  On-going      PT LONG TERM GOAL #2   Title  Pt will demonstrate 4/5 knee flexor strength to decrease risk of falls    Baseline  3/5, 04/28/17- 3/5    Time  8    Period  Weeks    Status  On-going      PT LONG TERM GOAL #3   Title  Pt will report 75% improvement in left knee pain to allow pt to complete her job using proper body mechanincs    Baseline  04/28/17- 50% improvement    Time  8    Period  Weeks    Status  On-going      PT LONG TERM GOAL #4   Title  Pt will demonstrate 4/5 bilateral ankle dorsiflexor strength to decrease risk of falls    Baseline  3/5, 04/28/17- R 4/5, L 3/5    Period  Weeks    Status  On-going      PT LONG TERM GOAL #5   Title  Pt will be able to complete 13 sit to stands in 30 seconds to decrease risk of falls    Baseline  7, 04/28/17- 9     Time  8    Period  Weeks    Status  On-going            Plan - 05/03/17 0930    Clinical Impression Statement  Focused on improving balance and stability while standing. Had pt perform high level balance exercises on airex foam today.  She became off balance at times but was able to self correct. Increased seated weights to 3 lbs but did have some knee discomfort.    Rehab Potential  Excellent    Clinical Impairments Affecting Rehab Potential  pt with increased left knee and right shoulder pain    PT Frequency  2x / week    PT Duration  8 weeks    PT Treatment/Interventions  ADLs/Self Care Home Management;Electrical Stimulation;Neuromuscular re-education;Therapeutic exercise;Balance training;Manual techniques;Patient/family  education;Therapeutic activities;Biofeedback    PT Next Visit Plan  continue balance exercises and LE exercises, give 3 way shoulder for pt to do at home    Consulted and Agree with Plan of Care  Patient       Patient will benefit from skilled therapeutic intervention in order to improve the following deficits and impairments:  Decreased balance, Decreased endurance, Decreased mobility, Difficulty walking, Impaired sensation, Decreased strength, Pain, Postural dysfunction, Increased edema, Increased muscle spasms, Increased fascial restricitons  Visit Diagnosis: Muscle weakness (generalized)  Chronic pain of left knee  Difficulty in walking, not elsewhere classified  Chronic right shoulder pain  Abnormal posture     Problem List Patient Active Problem List   Diagnosis Date Noted  . Physical deconditioning 03/25/2017  . Obesity (BMI 30.0-34.9) 03/25/2017  . Drug-induced hyperglycemia 12/23/2016  . Port catheter in place 12/02/2016  . Dysuria 12/02/2016  . 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 Acute rhinitis 11/04/2016  . Goals of care, counseling/discussion 11/04/2016  . 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 Endometrial cancer ([place]) 08/24/2016    Allyson Sabal [facility] 05/03/2017, 9:36 AM  [place], [place], [address] Phone: [phone]   Fax:  [phone]  Name: LETETIA ROMANELLO MRN: [id number] Date of Birth: Dec 13, 1965  Manus Gunning, PT 05/03/17 9:36 AM

## 2017-05-04 ENCOUNTER — Encounter: Payer: Self-pay | Admitting: Physical Therapy

## 2017-05-04 ENCOUNTER — Ambulatory Visit: Payer: BLUE CROSS/BLUE SHIELD | Admitting: Physical Therapy

## 2017-05-04 DIAGNOSIS — M62838 Other muscle spasm: Secondary | ICD-10-CM

## 2017-05-04 DIAGNOSIS — R279 Unspecified lack of coordination: Secondary | ICD-10-CM

## 2017-05-04 DIAGNOSIS — M6281 Muscle weakness (generalized): Secondary | ICD-10-CM | POA: Diagnosis not present

## 2017-05-04 NOTE — Therapy (Signed)
[facility] [address] [place], [place], [address] Phone: [phone]   Fax:  [phone]  Physical Therapy Treatment  Patient Details  Name: Danielle Guzman MRN: [id number] Date of Birth: January 21, 1966 Referring Provider: Dr. Heath Lark   Encounter Date: 05/04/2017  PT End of Session - 05/04/17 0852    Visit Number  19    Number of Visits  30    Date for PT Re-Evaluation  05/24/17    Authorization Type  BCBS/ coinsurance 30 visit limit combine with PT/OT/Chiropractor    Authorization - Visit Number  19    Authorization - Number of Visits  30    PT Start Time  0845    PT Stop Time  0930    PT Time Calculation (min)  45 min    Activity Tolerance  Patient tolerated treatment well    Behavior During Therapy  [facility] for tasks assessed/performed       Past Medical History:  Diagnosis Date  . Cancer ([place]) 2018   endometrial   . Headache    hx migraines yrs ago  . Malaria as child  . Miscarriage    x 2  . Pneumonia yrs ago  . Seizures ([place])    petite mal seizure in high school x 1, none since    Past Surgical History:  Procedure Laterality Date  . DENTAL SURGERY     gum graft 20 yrs ago and again on 08/26/16  . DILATATION & CURETTAGE/HYSTEROSCOPY WITH MYOSURE N/A 08/11/2016   Procedure: DILATATION & CURETTAGE/HYSTEROSCOPY WITH MYOSURE;  Surgeon: Jerelyn Charles, MD;  Location: [facility];  Service: Gynecology;  Laterality: N/A;  polyp removal  . IR FLUORO GUIDE PORT INSERTION RIGHT  11/10/2016  . IR REMOVAL TUN ACCESS W/ PORT W/O FL MOD SED  03/11/2017  . IR US GUIDE VASC ACCESS RIGHT  11/10/2016  . LYMPH NODE BIOPSY N/A 09/01/2016   Procedure: SENTINEL LYMPH NODE BIOPSY;  Surgeon: Everitt Amber, MD;  [facility]: [facility];  Service: Gynecology;  Laterality: N/A;  . ROBOTIC ASSISTED TOTAL HYSTERECTOMY WITH BILATERAL SALPINGO OOPHERECTOMY Bilateral 09/01/2016   Procedure: ROBOTIC ASSISTED TOTAL HYSTERECTOMY WITH BILATERAL SALPINGO  OOPHORECTOMY;  Surgeon: Everitt Amber, MD;  [facility];  Service: Gynecology;  Laterality: Bilateral;  . WISDOM TOOTH EXTRACTION      There were no vitals filed for this visit.  Subjective Assessment - 05/04/17 0848    Subjective  Pain with intercourse by 10%. The spiking pain in abdomen is 50% better. Only get it 2-3 times per day now.  The ache stays at a 2/10 more consistently. No change in urinary leakage.     Pertinent History  ovarian cancer with complete hysterectomy 09/01/16 and SLNB, completed chemotherapy on 02/23/17    Patient Stated Goals  to get stronger    Currently in Pain?  Yes    Pain Score  2     Pain Location  Pelvis    Pain Orientation  Mid    Pain Descriptors / Indicators  Aching    Pain Type  Chronic pain    Pain Onset  More than a month ago    Pain Frequency  Constant    Aggravating Factors   not sure what aggravates the lower abdominal and pelvic pain    Pain Relieving Factors  heat, rest    Multiple Pain Sites  No  Pelvic Floor Special Questions - 05/04/17 0001    Pelvic Floor Internal Exam  Patient confirms indentification and approves PT to assess pelvic floor integrity and treatment    Exam Type  Vaginal    Strength  fair squeeze, definite lift        [facility] Adult PT Treatment/Exercise - 05/04/17 0001      Manual Therapy   Manual Therapy  Soft tissue mobilization;Internal Pelvic Floor    Soft tissue mobilization  right hip adductors, right hamstring    Internal Pelvic Floor  right ishiocavernosus, right purborectalis, right              PT Education - 05/04/17 0932    Education provided  Yes    Education Details  pelvic floor exercise in [employer]) Educated  Patient    Methods  Explanation;Demonstration;Handout    Comprehension  Returned demonstration;Verbalized understanding       PT Short Term Goals - 05/04/17 0936      PT SHORT TERM GOAL #3   Title  Pt will report a 25% improvement in left  knee pain to allow for improved mobility    Baseline  04/28/17- 50% improvement    Time  4    Period  Weeks    Status  Achieved      PT SHORT TERM GOAL #4   Title  Deep ache in pelvic area durining daily tasks decreased >/= 25% due to improve pelvic floor muscle tissue mobility    Time  8    Period  Weeks    Status  Achieved      PT SHORT TERM GOAL #5   Title  ability to have penile penetration with her husband with pain decreased >/= 20% better     Baseline  10% better    Time  8    Period  Weeks    Status  On-going      PT SHORT TERM GOAL #6   Title  urinary leakage decreased >/= 25% and is down to 2 light pad per day due to increased pelvic floor strength    Baseline  no change yet    Time  8    Period  Weeks    Status  On-going        PT Long Term Goals - 04/28/17 0853      PT LONG TERM GOAL #1   Title  Pt will be independent in a home exercise program for continued strengthening and stretching    Time  8    Period  Weeks    Status  On-going      PT LONG TERM GOAL #2   Title  Pt will demonstrate 4/5 knee flexor strength to decrease risk of falls    Baseline  3/5, 04/28/17- 3/5    Time  8    Period  Weeks    Status  On-going      PT LONG TERM GOAL #3   Title  Pt will report 75% improvement in left knee pain to allow pt to complete her job using proper body mechanincs    Baseline  04/28/17- 50% improvement    Time  8    Period  Weeks    Status  On-going      PT LONG TERM GOAL #4   Title  Pt will demonstrate 4/5 bilateral ankle dorsiflexor strength to decrease risk of falls    Baseline  3/5, 04/28/17- R 4/5, L 3/5  Period  Weeks    Status  On-going      PT LONG TERM GOAL #5   Title  Pt will be able to complete 13 sit to stands in 30 seconds to decrease risk of falls    Baseline  7, 04/28/17- 9     Time  8    Period  Weeks    Status  On-going            Plan - 05/04/17 0850    Clinical Impression Statement  Patient continues to have urinary  leakage.  Patient reports her achy feeling and sharp pains have decreased by 50%.  Pain with penile penetration has decreased by 10%.  Patient has many trigger points in the right hip adductors and right pelvic floor with heat and pulsating releases.  Patient felt more open up after soft tissue work.  Patient is ready for gently pelvic floor strengthening.  Patient will benefit from skilled therapy to improve pelvic floor strenth and reduce muscle trigger points and tension.      Rehab Potential  Excellent    Clinical Impairments Affecting Rehab Potential  pt with increased left knee and right shoulder pain    PT Frequency  2x / week    PT Duration  8 weeks    PT Treatment/Interventions  ADLs/Self Care Home Management;Electrical Stimulation;Neuromuscular re-education;Therapeutic exercise;Balance training;Manual techniques;Patient/family education;Therapeutic activities;Biofeedback    PT Next Visit Plan  soft tissue work to left perineal area, pelvic floor strength, check pelvic alignment    PT Home Exercise Plan  progress as needed    Consulted and Agree with Plan of Care  Patient       Patient will benefit from skilled therapeutic intervention in order to improve the following deficits and impairments:  Decreased balance, Decreased endurance, Decreased mobility, Difficulty walking, Impaired sensation, Decreased strength, Pain, Postural dysfunction, Increased edema, Increased muscle spasms, Increased fascial restricitons  Visit Diagnosis: Muscle weakness (generalized)  Unspecified lack of coordination  Other muscle spasm     Problem List Patient Active Problem List   Diagnosis Date Noted  . Physical deconditioning 03/25/2017  . Obesity (BMI 30.0-34.9) 03/25/2017  . Drug-induced hyperglycemia 12/23/2016  . Port catheter in place 12/02/2016  . Dysuria 12/02/2016  . 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 Acute rhinitis 11/04/2016  . Goals of care,  counseling/discussion 11/04/2016  . 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 Endometrial cancer ([place]) 08/24/2016    Earlie Counts, PT 05/04/17 9:38 AM   [facility] [address] [place], [place], [address] Phone: [phone]   Fax:  [phone]  Name: BURNA ATLAS MRN: [id number] Date of Birth: Aug 02, 1965

## 2017-05-04 NOTE — Patient Instructions (Addendum)
Quick Contraction: Gravity Eliminated (Hook-Lying)    Lie with hips and knees bent. Quickly squeeze then fully relax pelvic floor. Perform __1_ sets of _5__. Rest for _1__ seconds between sets. Do __3_ times a day. Contract 50%  Copyright  VHI. All rights reserved.  Slow Contraction: Gravity Eliminated (Hook-Lying)    Lie with hips and knees bent. Slowly squeeze pelvic floor for __5_ seconds. Rest for _5__ seconds. Repeat _10__ times. Do __3_ times a day.  Contract 50%.  Copyright  VHI. All rights reserved.  [place] [address] [place], [place] [address] Phone # [phone] Fax [phone]

## 2017-05-05 ENCOUNTER — Ambulatory Visit: Payer: BLUE CROSS/BLUE SHIELD | Admitting: Physical Therapy

## 2017-05-05 ENCOUNTER — Encounter: Payer: Self-pay | Admitting: Physical Therapy

## 2017-05-05 DIAGNOSIS — M25511 Pain in right shoulder: Secondary | ICD-10-CM

## 2017-05-05 DIAGNOSIS — R262 Difficulty in walking, not elsewhere classified: Secondary | ICD-10-CM

## 2017-05-05 DIAGNOSIS — M25562 Pain in left knee: Secondary | ICD-10-CM

## 2017-05-05 DIAGNOSIS — M6281 Muscle weakness (generalized): Secondary | ICD-10-CM

## 2017-05-05 DIAGNOSIS — R293 Abnormal posture: Secondary | ICD-10-CM

## 2017-05-05 DIAGNOSIS — G8929 Other chronic pain: Secondary | ICD-10-CM

## 2017-05-05 NOTE — Therapy (Signed)
[place], [place], [address] Phone: [phone]   Fax:  [phone]  Physical Therapy Treatment  Patient Details  Name: Danielle Guzman MRN: [id number] Date of Birth: [birth year] Referring Provider: Dr. Heath Lark   Encounter Date: 05/05/2017  PT End of Session - 05/05/17 1212    Visit Number  20    Number of Visits  30    Date for PT Re-Evaluation  05/24/17    Authorization Type  BCBS/ coinsurance 30 visit limit combine with PT/OT/Chiropractor    Authorization - Visit Number  20    Authorization - Number of Visits  30    PT Start Time  [phone] pt arrived late    PT Stop Time  0933    PT Time Calculation (min)  40 min    Activity Tolerance  Patient tolerated treatment well    Behavior During Therapy  [facility] for tasks assessed/performed       Past Medical History:  Diagnosis Date  . Cancer ([place]) 2018   endometrial   . Headache    hx migraines yrs ago  . Malaria as child  . Miscarriage    x 2  . Pneumonia yrs ago  . 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 DENTAL SURGERY     gum graft 20 yrs ago and again on 08/26/16  . DILATATION & CURETTAGE/HYSTEROSCOPY WITH MYOSURE N/A 08/11/2016   Procedure: DILATATION & CURETTAGE/HYSTEROSCOPY WITH MYOSURE;  Surgeon: Jerelyn Charles, MD;  Location: [facility];  Service: Gynecology;  Laterality: N/A;  polyp removal  . IR FLUORO GUIDE PORT INSERTION RIGHT  11/10/2016  . IR REMOVAL TUN ACCESS W/ PORT W/O FL MOD SED  03/11/2017  . IR US GUIDE VASC ACCESS RIGHT  11/10/2016  . LYMPH NODE BIOPSY N/A 09/01/2016   Procedure: SENTINEL LYMPH NODE BIOPSY;  Surgeon: Everitt Amber, MD;  Location: [facility];  Service: Gynecology;  Laterality: N/A;  . ROBOTIC ASSISTED TOTAL HYSTERECTOMY WITH BILATERAL SALPINGO OOPHERECTOMY Bilateral 09/01/2016   Procedure: ROBOTIC ASSISTED TOTAL HYSTERECTOMY WITH BILATERAL  SALPINGO OOPHORECTOMY;  Surgeon: Everitt Amber, MD;  Location[facility];  Service: Gynecology;  Laterality: Bilateral;  . WISDOM TOOTH EXTRACTION      There were no vitals filed for this visit.  Subjective Assessment - 05/05/17 0854    Subjective  Joint pain is only a 1-2 lb. I did some exercises with the 3lb weight.     Pertinent History  ovarian cancer with complete hysterectomy 09/01/16 and SLNB, completed chemotherapy on 02/23/17    Patient Stated Goals  to get stronger    Currently in Pain?  Yes    Pain Score  3     Pain Location  Knee    Pain Orientation  Left                      [facility] Adult PT Treatment/Exercise - 05/05/17 0001      Neuro Re-ed    Neuro Re-ed Details   standing on Airex and marching x 10 reps, mini squats on airex keeping even weight through entire foot with verbal cues not to pull toes up, standing on 1 leg on airex x 20 sec bilaterally, standing 3 way hip with no added resistance on airex x 10 reps each      Knee/Hip Exercises: Aerobic   Recumbent  Bike  Level 2, 8 minutes - Rating of perceived exertion 7/10      Knee/Hip Exercises: Seated   Marching  Strengthening;Both;20 reps    Marching Weights  3 lbs.      Shoulder Exercises: Standing   Other Standing Exercises  3 way shoulder while standing on AirEx using 2lb weights x 10 reps             PT Education - 05/04/17 0932    Education provided  Yes    Education Details  pelvic floor exercise in [employer]) Educated  Patient    Methods  Explanation;Demonstration;Handout    Comprehension  Returned demonstration;Verbalized understanding       PT Short Term Goals - 05/05/17 0930      PT SHORT TERM GOAL #1   Title  Pt will be able to complete 9 sit to stands in 30 sec to decrease risk of falls    Baseline  7, 04/28/17- 9    Time  4    Period  Weeks    Status  Achieved      PT SHORT TERM GOAL #2   Title  Pt will demonstrate 3+/5 hip flexor strength to decrease risk of  falls when ambulating    Baseline  3/5, 04/28/17- 3/5    Time  4    Period  Weeks    Status  On-going      PT SHORT TERM GOAL #3   Title  Pt will report a 25% improvement in left knee pain to allow for improved mobility    Baseline  04/28/17- 50% improvement    Time  4    Period  Weeks    Status  Achieved        PT Long Term Goals - 04/28/17 6063      PT LONG TERM GOAL #1   Title  Pt will be independent in a home exercise program for continued strengthening and stretching    Time  8    Period  Weeks    Status  On-going      PT LONG TERM GOAL #2   Title  Pt will demonstrate 4/5 knee flexor strength to decrease risk of falls    Baseline  3/5, 04/28/17- 3/5    Time  8    Period  Weeks    Status  On-going      PT LONG TERM GOAL #3   Title  Pt will report 75% improvement in left knee pain to allow pt to complete her job using proper body mechanincs    Baseline  04/28/17- 50% improvement    Time  8    Period  Weeks    Status  On-going      PT LONG TERM GOAL #4   Title  Pt will demonstrate 4/5 bilateral ankle dorsiflexor strength to decrease risk of falls    Baseline  3/5, 04/28/17- R 4/5, L 3/5    Period  Weeks    Status  On-going      PT LONG TERM GOAL #5   Title  Pt will be able to complete 13 sit to stands in 30 seconds to decrease risk of falls    Baseline  7, 04/28/17- 9     Time  8    Period  Weeks    Status  On-going            Plan - 05/05/17 1213    Clinical Impression Statement  Pt is having pain with end range left knee extension in area of pes anserine which may be caused by inflammation of the tendons in this area. Did not do seated knee extension with weights today to avoid further irritation. Continued with focus on improving dynamic standing balance on airex foam.     Rehab Potential  Excellent    Clinical Impairments Affecting Rehab Potential  pt with increased left knee and right shoulder pain    PT Frequency  2x / week    PT Duration  8 weeks     PT Treatment/Interventions  ADLs/Self Care Home Management;Electrical Stimulation;Neuromuscular re-education;Therapeutic exercise;Balance training;Manual techniques;Patient/family education;Therapeutic activities    PT Next Visit Plan  continue work on dynamic balance, LE and UE strengthening    Consulted and Agree with Plan of Care  Patient       Patient will benefit from skilled therapeutic intervention in order to improve the following deficits and impairments:  Decreased balance, Decreased endurance, Decreased mobility, Difficulty walking, Impaired sensation, Decreased strength, Pain, Postural dysfunction, Increased edema, Increased muscle spasms, Increased fascial restricitons  Visit Diagnosis: Muscle weakness (generalized)  Chronic pain of left knee  Difficulty in walking, not elsewhere classified  Chronic right shoulder pain  Abnormal posture     Problem List Patient Active Problem List   Diagnosis Date Noted  . Physical deconditioning 03/25/2017  . Obesity (BMI 30.0-34.9) 03/25/2017  . Drug-induced hyperglycemia 12/23/2016  . Port catheter in place 12/02/2016  . Dysuria 12/02/2016  . 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 Acute rhinitis 11/04/2016  . Goals of care, counseling/discussion 11/04/2016  . 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 Endometrial cancer ([place]) 08/24/2016    Allyson Sabal [facility] 05/05/2017, 12:17 PM  [place], [place], [address] Phone: [phone]   Fax:  [phone]  Name: Danielle Guzman MRN: [id number] Date of Birth: 06-May-1965  Manus Gunning, PT 05/05/17 12:18 PM

## 2017-05-05 NOTE — Patient Instructions (Signed)
3 Way Raises:      Starting Position:  Leaning against wall, walk feet a few inches away from the wall and make tummy tight (tuck hips underneath you) Press back/shoulders/head against wall as much as possible. Keep thumbs up to ceiling, elbows straight and shoulders relaxed/down throughout.  1. Lift arms in front to shoulder height 2. Lift arms a little wider into a "V" to shoulder height 3. Lift arms out to sides in a "T" to shoulder height  Perform 10 times in each direction. Hold 2 lbs to start with and work up to 2-3 sets of 10/day. Perform 3-4 times/week. Increase weight as able, decreasing sets of 10 each time you increase weights, then slowly working your way back up to 2-3 sets each time.    Cancer Rehab [phone]

## 2017-05-06 ENCOUNTER — Encounter: Payer: Self-pay | Admitting: Physical Therapy

## 2017-05-06 ENCOUNTER — Ambulatory Visit: Payer: BLUE CROSS/BLUE SHIELD | Admitting: Physical Therapy

## 2017-05-06 DIAGNOSIS — R279 Unspecified lack of coordination: Secondary | ICD-10-CM

## 2017-05-06 DIAGNOSIS — M62838 Other muscle spasm: Secondary | ICD-10-CM

## 2017-05-06 DIAGNOSIS — M6281 Muscle weakness (generalized): Secondary | ICD-10-CM | POA: Diagnosis not present

## 2017-05-06 NOTE — Patient Instructions (Addendum)
Pelvic Floor Drop.m2ts   [facility] and [place]  y  you tube   [facility] [address] [place], [place] [address] Phone # [phone] Fax [phone]

## 2017-05-06 NOTE — Therapy (Signed)
[facility] [address], [place], [place], [address] Phone: [phone]   Fax:  [phone]  Physical Therapy Treatment  Patient Details  Name: Danielle Guzman MRN: [id number] Date of Birth: Nov 07, 1965 Referring Provider: Dr. Heath Lark   Encounter Date: 05/06/2017  PT End of Session - 05/06/17 0929    Visit Number  21    Number of Visits  30    Date for PT Re-Evaluation  05/24/17    Authorization Type  BCBS/ coinsurance 30 visit limit combine with PT/OT/Chiropractor    Authorization - Visit Number  21    Authorization - Number of Visits  30    PT Start Time  0845    PT Stop Time  0928    PT Time Calculation (min)  43 min    Activity Tolerance  Patient tolerated treatment well    Behavior During Therapy  [facility] for tasks assessed/performed       Past Medical History:  Diagnosis Date  . Cancer ([place]) 2018   endometrial   . Headache    hx migraines yrs ago  . Malaria as child  . Miscarriage    x 2  . Pneumonia yrs ago  . Seizures ([place])    petite mal seizure in high school x 1, none since    Past Surgical History:  Procedure Laterality Date  . DENTAL SURGERY     gum graft 20 yrs ago and again on 08/26/16  . DILATATION & CURETTAGE/HYSTEROSCOPY WITH MYOSURE N/A 08/11/2016   Procedure: DILATATION & CURETTAGE/HYSTEROSCOPY WITH MYOSURE;  Surgeon: Jerelyn Charles, MD;  Location: [facility];  Service: Gynecology;  Laterality: N/A;  polyp removal  . IR FLUORO GUIDE PORT INSERTION RIGHT  11/10/2016  . IR REMOVAL TUN ACCESS W/ PORT W/O FL MOD SED  03/11/2017  . IR US GUIDE VASC ACCESS RIGHT  11/10/2016  . LYMPH NODE BIOPSY N/A 09/01/2016   Procedure: SENTINEL LYMPH NODE BIOPSY;  Surgeon: Everitt Amber, MD;  Location: [facility];  Service: Gynecology;  Laterality: N/A;  . ROBOTIC ASSISTED TOTAL HYSTERECTOMY WITH BILATERAL SALPINGO OOPHERECTOMY Bilateral 09/01/2016   Procedure: ROBOTIC ASSISTED TOTAL HYSTERECTOMY WITH BILATERAL SALPINGO  OOPHORECTOMY;  Surgeon: Everitt Amber, MD;  Location: [facility];  Service: Gynecology;  Laterality: Bilateral;  . WISDOM TOOTH EXTRACTION      There were no vitals filed for this visit.  Subjective Assessment - 05/06/17 0847    Subjective  Last session was really helpful.  The constant ache in lower pelvic area continues to decrease in increments.  The ache is not distracting me as much.     Pertinent History  ovarian cancer with complete hysterectomy 09/01/16 and SLNB, completed chemotherapy on 02/23/17    Patient Stated Goals  to get stronger; reduce pain and improve urinary leakage    Currently in Pain?  Yes    Pain Score  3     Pain Orientation  Left    Pain Descriptors / Indicators  Aching    Pain Type  Chronic pain    Pain Onset  More than a month ago    Pain Frequency  Constant    Aggravating Factors   full left knee extension    Pain Relieving Factors  heat, rest    Multiple Pain Sites  No    Pain Score  2 can dip to a one    Pain Location  Pelvis    Pain Orientation  Mid    Pain Descriptors /  Indicators  Aching    Pain Type  Chronic pain    Pain Onset  More than a month ago    Pain Frequency  Constant    Aggravating Factors   intercourse    Pain Relieving Factors  hot tub                   Pelvic Floor Special Questions - 05/06/17 0001    Pelvic Floor Internal Exam  Patient confirms indentification and approves PT to assess pelvic floor integrity and treatment    Exam Type  Vaginal    Strength  fair squeeze, definite lift        [facility] Adult PT Treatment/Exercise - 05/06/17 0001      Neuro Re-ed    Neuro Re-ed Details   lay supine with feet on ball with contraction of hip muscles to relax the pelvic  floor muscles      Manual Therapy   Manual Therapy  Soft tissue mobilization;Internal Pelvic Floor    Soft tissue mobilization  left hip adductor and pes ancerine    Internal Pelvic Floor  left ischicavernosus, left obturator internist, puborectoalis while one  finger internal and external             PT Education - 05/06/17 0925    Education provided  Yes    Education Details  pelvic drop    Person(s) Educated  Patient    Methods  Explanation;Handout;Demonstration    Comprehension  Verbalized understanding;Returned demonstration       PT Short Term Goals - 05/05/17 0930      PT SHORT TERM GOAL #1   Title  Pt will be able to complete 9 sit to stands in 30 sec to decrease risk of falls    Baseline  7, 04/28/17- 9    Time  4    Period  Weeks    Status  Achieved      PT SHORT TERM GOAL #2   Title  Pt will demonstrate 3+/5 hip flexor strength to decrease risk of falls when ambulating    Baseline  3/5, 04/28/17- 3/5    Time  4    Period  Weeks    Status  On-going      PT SHORT TERM GOAL #3   Title  Pt will report a 25% improvement in left knee pain to allow for improved mobility    Baseline  04/28/17- 50% improvement    Time  4    Period  Weeks    Status  Achieved        PT Long Term Goals - 04/28/17 7062      PT LONG TERM GOAL #1   Title  Pt will be independent in a home exercise program for continued strengthening and stretching    Time  8    Period  Weeks    Status  On-going      PT LONG TERM GOAL #2   Title  Pt will demonstrate 4/5 knee flexor strength to decrease risk of falls    Baseline  3/5, 04/28/17- 3/5    Time  8    Period  Weeks    Status  On-going      PT LONG TERM GOAL #3   Title  Pt will report 75% improvement in left knee pain to allow pt to complete her job using proper body mechanincs    Baseline  04/28/17- 50% improvement    Time  8  Period  Weeks    Status  On-going      PT LONG TERM GOAL #4   Title  Pt will demonstrate 4/5 bilateral ankle dorsiflexor strength to decrease risk of falls    Baseline  3/5, 04/28/17- R 4/5, L 3/5    Period  Weeks    Status  On-going      PT LONG TERM GOAL #5   Title  Pt will be able to complete 13 sit to stands in 30 seconds to decrease risk of falls     Baseline  7, 04/28/17- 9     Time  8    Period  Weeks    Status  On-going            Plan - 05/06/17 0850    Clinical Impression Statement  Patient pelvic ache is reducing daily after treatment.  Patient reports the ache is not disturbing her day now.  Patient is noe working on pelvic strength.  Patient has increased softness of the pelvic floor. Patient will benefit from skilled therapy to reduce pelvic pain and improve strength.     Rehab Potential  Excellent    Clinical Impairments Affecting Rehab Potential  pt with increased left knee and right shoulder pain    PT Frequency  2x / week    PT Duration  8 weeks    PT Treatment/Interventions  ADLs/Self Care Home Management;Electrical Stimulation;Neuromuscular re-education;Therapeutic exercise;Balance training;Manual techniques;Patient/family education;Therapeutic activities    PT Next Visit Plan  work on right pelvic floor    PT Home Exercise Plan  progress as needed    Consulted and Agree with Plan of Care  Patient       Patient will benefit from skilled therapeutic intervention in order to improve the following deficits and impairments:  Decreased balance, Decreased endurance, Decreased mobility, Difficulty walking, Impaired sensation, Decreased strength, Pain, Postural dysfunction, Increased edema, Increased muscle spasms, Increased fascial restricitons  Visit Diagnosis: Muscle weakness (generalized)  Unspecified lack of coordination  Other muscle spasm     Problem List Patient Active Problem List   Diagnosis Date Noted  . Physical deconditioning 03/25/2017  . Obesity (BMI 30.0-34.9) 03/25/2017  . Drug-induced hyperglycemia 12/23/2016  . Port catheter in place 12/02/2016  . Dysuria 12/02/2016  . 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 Acute rhinitis 11/04/2016  . Goals of care, counseling/discussion 11/04/2016  . 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 Endometrial  cancer ([place]) 08/24/2016   Earlie Counts, PT 05/06/17 9:31 AM   [facility] [address] [place], [place], [address] Phone: [phone]   Fax:  [phone]  Name: LEVETTA BOGNAR MRN: [id number] Date of Birth: [birth year]

## 2017-05-10 ENCOUNTER — Ambulatory Visit: Payer: BLUE CROSS/BLUE SHIELD | Admitting: Physical Therapy

## 2017-05-10 ENCOUNTER — Encounter: Payer: Self-pay | Admitting: Physical Therapy

## 2017-05-10 DIAGNOSIS — G8929 Other chronic pain: Secondary | ICD-10-CM

## 2017-05-10 DIAGNOSIS — M6281 Muscle weakness (generalized): Secondary | ICD-10-CM | POA: Diagnosis not present

## 2017-05-10 DIAGNOSIS — R262 Difficulty in walking, not elsewhere classified: Secondary | ICD-10-CM

## 2017-05-10 DIAGNOSIS — M25562 Pain in left knee: Secondary | ICD-10-CM

## 2017-05-10 NOTE — Therapy (Signed)
[place], [place], [address] Phone: [phone]   Fax:  [phone]  Physical Therapy Treatment  Patient Details  Name: Danielle Guzman MRN: [id number] Date of Birth: [birth year] Referring Provider: Dr. Heath Lark   Encounter Date: 05/10/2017  PT End of Session - 05/10/17 0942    Visit Number  22    Number of Visits  30    Date for PT Re-Evaluation  05/24/17    Authorization Type  BCBS/ coinsurance 30 visit limit combine with PT/OT/Chiropractor    Authorization - Visit Number  98    Authorization - Number of Visits  30    PT Start Time  [phone]    PT Stop Time  0938    PT Time Calculation (min)  46 min    Activity Tolerance  Patient tolerated treatment well    Behavior During Therapy  [facility] for tasks assessed/performed       Past Medical History:  Diagnosis Date  . Cancer ([place]) 2018   endometrial   . Headache    hx migraines yrs ago  . Malaria as child  . Miscarriage    x 2  . Pneumonia yrs ago  . Seizures ([place])    petite mal seizure in high school x 1, none since    Past Surgical History:  Procedure Laterality Date  . DENTAL SURGERY     gum graft 20 yrs ago and again on 08/26/16  . DILATATION & CURETTAGE/HYSTEROSCOPY WITH MYOSURE N/A 08/11/2016   Procedure: DILATATION & CURETTAGE/HYSTEROSCOPY WITH MYOSURE;  Surgeon: Jerelyn Charles, MD;  Location: [facility];  Service: Gynecology;  Laterality: N/A;  polyp removal  . IR FLUORO GUIDE PORT INSERTION RIGHT  11/10/2016  . IR REMOVAL TUN ACCESS W/ PORT W/O FL MOD SED  03/11/2017  . IR US GUIDE VASC ACCESS RIGHT  11/10/2016  . LYMPH NODE BIOPSY N/A 09/01/2016   Procedure: SENTINEL LYMPH NODE BIOPSY;  Surgeon: Everitt Amber, MD;  Location: [facility];  Service: Gynecology;  Laterality: N/A;  . ROBOTIC ASSISTED TOTAL HYSTERECTOMY WITH BILATERAL SALPINGO OOPHERECTOMY Bilateral 09/01/2016   Procedure: ROBOTIC ASSISTED TOTAL HYSTERECTOMY WITH BILATERAL SALPINGO  OOPHORECTOMY;  Surgeon: Everitt Amber, MD;  [facility];  Service: Gynecology;  Laterality: Bilateral;  . WISDOM TOOTH EXTRACTION      There were no vitals filed for this visit.  Subjective Assessment - 05/10/17 0854    Subjective  My exercises are good. I am in more pain today for some reason. I wonder if it is weather releated. Both of my knees hurt worse and my foot pain has gone up a lot.     Pertinent History  ovarian cancer with complete hysterectomy 09/01/16 and SLNB, completed chemotherapy on 02/23/17    Patient Stated Goals  to get stronger; reduce pain and improve urinary leakage    Currently in Pain?  Yes    Pain Score  4     Pain Location  Knee    Pain Orientation  Left                      [facility] Adult PT Treatment/Exercise - 05/10/17 0001      Knee/Hip Exercises: Aerobic   Recumbent Bike  Level 3, 7 minutes RPE- 8/10      Knee/Hip Exercises: Seated   Long Arc Quad  Strengthening;20 reps    Long Arc Quad Weight  3 lbs.    Marching  Strengthening;Both;20 reps  Marching Weights  3 lbs.      Ankle Exercises: Seated   Other Seated Ankle Exercises  seated ankle DF, PF, Eversion and Inversion all with red theraband x 20 reps each bilaterally               PT Short Term Goals - 05/10/17 0912      PT SHORT TERM GOAL #1   Title  Pt will be able to complete 9 sit to stands in 30 sec to decrease risk of falls    Baseline  7, 04/28/17- 9    Time  4    Period  Weeks    Status  Achieved      PT SHORT TERM GOAL #2   Title  Pt will demonstrate 3+/5 hip flexor strength to decrease risk of falls when ambulating    Baseline  3/5, 04/28/17- 3/5, 05/10/17- 3+/5 bilaterally    Time  4    Period  Weeks    Status  Achieved      PT SHORT TERM GOAL #3   Title  Pt will report a 25% improvement in left knee pain to allow for improved mobility    Baseline  04/28/17- 50% improvement    Time  4    Period  Weeks    Status  Achieved        PT Long Term  Goals - 05/10/17 0914      PT LONG TERM GOAL #1   Title  Pt will be independent in a home exercise program for continued strengthening and stretching    Time  8    Period  Weeks    Status  On-going      PT LONG TERM GOAL #2   Title  Pt will demonstrate 4/5 knee flexor strength to decrease risk of falls    Baseline  3/5, 04/28/17- 3/5, 05/10/17- R 4/5, L 3+/5    Time  8    Period  Weeks    Status  On-going      PT LONG TERM GOAL #3   Title  Pt will report 75% improvement in left knee pain to allow pt to complete her job using proper body mechanincs    Baseline  04/28/17- 50% improvement, 05/10/17- 50-60% improvement    Time  8    Period  Weeks    Status  On-going      PT LONG TERM GOAL #4   Title  Pt will demonstrate 4/5 bilateral ankle dorsiflexor strength to decrease risk of falls    Baseline  3/5, 04/28/17- R 4/5, L 3/5, 05/10/17- 3/5    Time  8    Period  Weeks    Status  On-going      PT LONG TERM GOAL #5   Title  Pt will be able to complete 13 sit to stands in 30 seconds to decrease risk of falls    Baseline  7, 04/28/17- 9, 05/10/17- 10     Time  8    Period  Weeks    Status  On-going            Plan - 05/10/17 0943    Clinical Impression Statement  Assessed pt's progress towards goals in therapy. She has now met all short term goals and has made progress towards all long term goals except for ankle strength. Her bilateral ankle dorsiflexion remains 3/5. Issued ankle strengthening exercises with theraband for pt to add to her home exercise program. She has  been having more knee pain the past few days but this could be attributed to the weather.     Clinical Impairments Affecting Rehab Potential  pt with increased left knee and right shoulder pain    PT Frequency  2x / week    PT Duration  8 weeks    PT Treatment/Interventions  ADLs/Self Care Home Management;Electrical Stimulation;Neuromuscular re-education;Therapeutic exercise;Balance training;Manual  techniques;Patient/family education;Therapeutic activities    PT Next Visit Plan  continue LE, UE strengthening and balance     Consulted and Agree with Plan of Care  Patient       Patient will benefit from skilled therapeutic intervention in order to improve the following deficits and impairments:  Decreased balance, Decreased endurance, Decreased mobility, Difficulty walking, Impaired sensation, Decreased strength, Pain, Postural dysfunction, Increased edema, Increased muscle spasms, Increased fascial restricitons  Visit Diagnosis: Muscle weakness (generalized)  Difficulty in walking, not elsewhere classified  Chronic pain of left knee     Problem List Patient Active Problem List   Diagnosis Date Noted  . Physical deconditioning 03/25/2017  . Obesity (BMI 30.0-34.9) 03/25/2017  . Drug-induced hyperglycemia 12/23/2016  . Port catheter in place 12/02/2016  . Dysuria 12/02/2016  . 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 Acute rhinitis 11/04/2016  . Goals of care, counseling/discussion 11/04/2016  . 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 Endometrial cancer ([place]) 08/24/2016    Allyson Sabal [facility] 05/10/2017, 9:45 AM  [place], [place], [address] Phone: [phone]   Fax:  [phone]  Name: Danielle Guzman MRN: [id number] Date of Birth: 1965-12-27  Manus Gunning, PT 05/10/17 9:45 AM

## 2017-05-11 ENCOUNTER — Ambulatory Visit: Payer: BLUE CROSS/BLUE SHIELD | Admitting: Physical Therapy

## 2017-05-11 ENCOUNTER — Encounter: Payer: Self-pay | Admitting: Physical Therapy

## 2017-05-11 DIAGNOSIS — M62838 Other muscle spasm: Secondary | ICD-10-CM

## 2017-05-11 DIAGNOSIS — M6281 Muscle weakness (generalized): Secondary | ICD-10-CM | POA: Diagnosis not present

## 2017-05-11 DIAGNOSIS — R279 Unspecified lack of coordination: Secondary | ICD-10-CM

## 2017-05-11 NOTE — Therapy (Signed)
[facility] [address], [place], [place], [address] Phone: [phone]   Fax:  [phone]  Physical Therapy Treatment  Patient Details  Name: Danielle Guzman MRN: [id number] Date of Birth: 1965/07/24 Referring Provider: Dr. Heath Lark   Encounter Date: 05/11/2017  PT End of Session - 05/11/17 0933    Visit Number  23    Number of Visits  30    Date for PT Re-Evaluation  05/24/17    Authorization Type  BCBS/ coinsurance 30 visit limit combine with PT/OT/Chiropractor    Authorization - Visit Number  23    Authorization - Number of Visits  30    PT Start Time  [phone]    PT Stop Time  1013    PT Time Calculation (min)  39 min    Activity Tolerance  Patient tolerated treatment well    Behavior During Therapy  [facility] for tasks assessed/performed       Past Medical History:  Diagnosis Date  . Cancer ([place]) 2018   endometrial   . Headache    hx migraines yrs ago  . Malaria as child  . Miscarriage    x 2  . Pneumonia yrs ago  . 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 DENTAL SURGERY     gum graft 20 yrs ago and again on 08/26/16  . DILATATION & CURETTAGE/HYSTEROSCOPY WITH MYOSURE N/A 08/11/2016   Procedure: DILATATION & CURETTAGE/HYSTEROSCOPY WITH MYOSURE;  Surgeon: Jerelyn Charles, MD;  Location: [facility];  Service: Gynecology;  Laterality: N/A;  polyp removal  . IR FLUORO GUIDE PORT INSERTION RIGHT  11/10/2016  . IR REMOVAL TUN ACCESS W/ PORT W/O FL MOD SED  03/11/2017  . IR US GUIDE VASC ACCESS RIGHT  11/10/2016  . LYMPH NODE BIOPSY N/A 09/01/2016   Procedure: SENTINEL LYMPH NODE BIOPSY;  Surgeon: Everitt Amber, MD;  Location: [facility];  Service: Gynecology;  Laterality: N/A;  . ROBOTIC ASSISTED TOTAL HYSTERECTOMY WITH BILATERAL SALPINGO OOPHERECTOMY Bilateral 09/01/2016   Procedure: ROBOTIC ASSISTED TOTAL HYSTERECTOMY WITH BILATERAL SALPINGO  OOPHORECTOMY;  Surgeon: Everitt Amber, MD;  Location: [facility];  Service: Gynecology;  Laterality: Bilateral;  . WISDOM TOOTH EXTRACTION      There were no vitals filed for this visit.  Subjective Assessment - 05/11/17 0934    Subjective  My knees flared up this weekend. I have been doing my exercises.  No change in urinary leakage. Spiking pain of the pelvis is less and happens 2-3 times per day and will go up to 6-7/10 instead of 10/10.     Pertinent History  ovarian cancer with complete hysterectomy 09/01/16 and SLNB, completed chemotherapy on 02/23/17    Patient Stated Goals  to get stronger; reduce pain and improve urinary leakage    Currently in Pain?  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    Multiple Pain Sites  Yes    Pain Score  2    Pain Location  Pelvis    Pain Orientation  Mid    Pain Descriptors / Indicators  Aching    Pain Type  Chronic pain    Pain Onset  More than a month ago    Pain Frequency  Constant    Aggravating Factors   intercourse    Pain Relieving Factors  hot tub                   Pelvic Floor Special Questions - 05/11/17 0001    Pelvic Floor Internal Exam  Patient confirms indentification and approves PT to assess pelvic floor integrity and treatment    Exam Type  Vaginal    Strength  good squeeze, good lift, able to hold agaisnt strong resistance circular contraction with lift    Strength # of seconds  3 discomfort in left buttocks to left coccyx        [facility] Adult PT Treatment/Exercise - 05/11/17 0001      Neuro Re-ed    Neuro Re-ed  Details   pelvic floor contraction wiht index finger palpating the muscles to give tactile cues for contraction in supine       Knee/Hip Exercises: Supine   Other Supine Knee/Hip Exercises  feet on ball with knees outward and  and resist at knees with hip flexion and ER hold 5 sec 10 x to elongate the pelvic floor      Manual Therapy   Manual Therapy  Soft tissue mobilization;Internal Pelvic Floor    Soft tissue mobilization  right hip adductors    Internal Pelvic Floor  righ tbulbocavernosus, ischiocavernous, urethra sphincter, obturator internist, puborectalis               PT Short Term Goals - 05/10/17 0912      PT SHORT TERM GOAL #1   Title  Pt will be able to complete 9 sit to stands in 30 sec to decrease risk of falls    Baseline  7, 04/28/17- 9    Time  4    Period  Weeks    Status  Achieved      PT SHORT TERM GOAL #2   Title  Pt will demonstrate 3+/5 hip flexor strength to decrease risk of falls when ambulating    Baseline  3/5, 04/28/17- 3/5, 05/10/17- 3+/5 bilaterally    Time  4    Period  Weeks    Status  Achieved      PT SHORT TERM GOAL #3   Title  Pt will report a 25% improvement in left knee pain to allow for improved mobility    Baseline  04/28/17- 50% improvement    Time  4    Period  Weeks    Status  Achieved        PT Long Term Goals - 05/10/17 0914      PT LONG TERM GOAL #1   Title  Pt will be independent in a home exercise program for continued strengthening and stretching    Time  8    Period  Weeks    Status  On-going      PT LONG TERM GOAL #2   Title  Pt will demonstrate 4/5 knee flexor strength to decrease risk of falls    Baseline  3/5, 04/28/17- 3/5, 05/10/17- R 4/5, L 3+/5  Time  8    Period  Weeks    Status  On-going      PT LONG TERM GOAL #3   Title  Pt will report 75% improvement in left knee pain to allow pt to complete her job using proper body mechanincs    Baseline  04/28/17- 50% improvement, 05/10/17- 50-60% improvement     Time  8    Period  Weeks    Status  On-going      PT LONG TERM GOAL #4   Title  Pt will demonstrate 4/5 bilateral ankle dorsiflexor strength to decrease risk of falls    Baseline  3/5, 04/28/17- R 4/5, L 3/5, 05/10/17- 3/5    Time  8    Period  Weeks    Status  On-going      PT LONG TERM GOAL #5   Title  Pt will be able to complete 13 sit to stands in 30 seconds to decrease risk of falls    Baseline  7, 04/28/17- 9, 05/10/17- 10     Time  8    Period  Weeks    Status  On-going            Plan - 05/11/17 1009    Clinical Impression Statement  Patient pelvic floor strength has increased to 4/5 holding for 3 seconds and is circular for first time.  After therapy, patient had no pelvic pain.  When patient would contract the pelvic floor she would feel discomfort in the left pelvic floor to the coccyx.  Patient has no change in the urinary leakage.  Patient will benefit from skilled therapy to improve pelvic floor strength and tissue mobility.     Rehab Potential  Excellent    Clinical Impairments Affecting Rehab Potential  pt with increased left knee and right shoulder pain    PT Frequency  2x / week    PT Duration  8 weeks    PT Treatment/Interventions  ADLs/Self Care Home Management;Electrical Stimulation;Neuromuscular re-education;Therapeutic exercise;Balance training;Manual techniques;Patient/family education;Therapeutic activities    PT Next Visit Plan  work on left pelvic floor with pelvic floor strength    PT Home Exercise Plan  progress as needed    Consulted and Agree with Plan of Care  Patient       Patient will benefit from skilled therapeutic intervention in order to improve the following deficits and impairments:  Decreased balance, Decreased endurance, Decreased mobility, Difficulty walking, Impaired sensation, Decreased strength, Pain, Postural dysfunction, Increased edema, Increased muscle spasms, Increased fascial restricitons  Visit Diagnosis: Muscle weakness  (generalized)  Unspecified lack of coordination  Other muscle spasm     Problem List Patient Active Problem List   Diagnosis Date Noted  . Physical deconditioning 03/25/2017  . Obesity (BMI 30.0-34.9) 03/25/2017  . Drug-induced hyperglycemia 12/23/2016  . Port catheter in place 12/02/2016  . Dysuria 12/02/2016  . 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 Acute rhinitis 11/04/2016  . Goals of care, counseling/discussion 11/04/2016  . 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 Endometrial cancer ([place]) 08/24/2016    Earlie Counts, PT 05/11/17 10:12 AM   [facility] [address], [place], [place], [address] Phone: [phone]   Fax:  [phone]  Name: Danielle Guzman MRN: [id number] Date of Birth: [birth year]

## 2017-05-12 ENCOUNTER — Encounter: Payer: Self-pay | Admitting: Physical Therapy

## 2017-05-12 ENCOUNTER — Ambulatory Visit: Payer: BLUE CROSS/BLUE SHIELD | Admitting: Physical Therapy

## 2017-05-12 DIAGNOSIS — M25562 Pain in left knee: Secondary | ICD-10-CM

## 2017-05-12 DIAGNOSIS — M6281 Muscle weakness (generalized): Secondary | ICD-10-CM | POA: Diagnosis not present

## 2017-05-12 DIAGNOSIS — G8929 Other chronic pain: Secondary | ICD-10-CM

## 2017-05-12 DIAGNOSIS — R262 Difficulty in walking, not elsewhere classified: Secondary | ICD-10-CM

## 2017-05-13 ENCOUNTER — Ambulatory Visit: Payer: BLUE CROSS/BLUE SHIELD | Admitting: Physical Therapy

## 2017-05-13 ENCOUNTER — Encounter: Payer: Self-pay | Admitting: Physical Therapy

## 2017-05-13 DIAGNOSIS — M62838 Other muscle spasm: Secondary | ICD-10-CM

## 2017-05-13 DIAGNOSIS — R279 Unspecified lack of coordination: Secondary | ICD-10-CM

## 2017-05-13 DIAGNOSIS — M6281 Muscle weakness (generalized): Secondary | ICD-10-CM

## 2017-05-13 NOTE — Patient Instructions (Addendum)
http://www.maynard.net/  Use the smallest egg while going from sitting to standing.  Sit to Stand: Phase 2    Sitting, squeeze pelvic floor and hold. Lean trunk forward and push up on arms. Relax. Repeat __10_ times. Do _1_ times a day. Use the smallest egg Copyright  VHI. All rights reserved.   Double void is to standup from commode then sit down and see if you still need to urinate.    Knack- everytime you sneeze, cough, or laugh contract pelvic floor    While lying on your side with your knees bent, draw up the top knee while keeping contact of your feet together. Contract pelvic floor with exercise.   Do not let your pelvis roll back during the lifting movement.    15 times each side 1 time per day.  [facility] [address], [place], [place] [address] Phone # [phone] Fax [phone]

## 2017-05-13 NOTE — Therapy (Signed)
[facility] [address] [place], [place], [address] Phone: [phone]   Fax:  [phone]  Physical Therapy Treatment  Patient Details  Name: Danielle Guzman MRN: [id number] Date of Birth: 1965/07/17 Referring Provider: Dr. Heath Lark   Encounter Date: 05/13/2017  PT End of Session - 05/13/17 1026    Visit Number  24    Number of Visits  30    Date for PT Re-Evaluation  05/24/17    Authorization Type  BCBS/ coinsurance 30 visit limit combine with PT/OT/Chiropractor    Authorization - Visit Number  24    Authorization - Number of Visits  30    PT Start Time  0930    PT Stop Time  1011    PT Time Calculation (min)  41 min    Activity Tolerance  Patient tolerated treatment well    Behavior During Therapy  [facility] for tasks assessed/performed       Past Medical History:  Diagnosis Date  . Cancer ([place]) 2018   endometrial   . Headache    hx migraines yrs ago  . Malaria as child  . Miscarriage    x 2  . Pneumonia yrs ago  . Seizures ([place])    petite mal seizure in high school x 1, none since    Past Surgical History:  Procedure Laterality Date  . DENTAL SURGERY     gum graft 20 yrs ago and again on 08/26/16  . DILATATION & CURETTAGE/HYSTEROSCOPY WITH MYOSURE N/A 08/11/2016   Procedure: DILATATION & CURETTAGE/HYSTEROSCOPY WITH MYOSURE;  Surgeon: Jerelyn Charles, MD;  Location: [facility];  Service: Gynecology;  Laterality: N/A;  polyp removal  . IR FLUORO GUIDE PORT INSERTION RIGHT  11/10/2016  . IR REMOVAL TUN ACCESS W/ PORT W/O FL MOD SED  03/11/2017  . IR US GUIDE VASC ACCESS RIGHT  11/10/2016  . LYMPH NODE BIOPSY N/A 09/01/2016   Procedure: SENTINEL LYMPH NODE BIOPSY;  Surgeon: Everitt Amber, MD;  Location: [facility];  Service: Gynecology;  Laterality: N/A;  . ROBOTIC ASSISTED TOTAL HYSTERECTOMY WITH BILATERAL SALPINGO OOPHERECTOMY Bilateral 09/01/2016   Procedure: ROBOTIC ASSISTED TOTAL HYSTERECTOMY WITH BILATERAL SALPINGO  OOPHORECTOMY;  Surgeon: Everitt Amber, MD;  Location: [facility];  Service: Gynecology;  Laterality: Bilateral;  . WISDOM TOOTH EXTRACTION      There were no vitals filed for this visit.  Subjective Assessment - 05/13/17 0928    Subjective  Less ache in pelvis.  I am doing my exercises. I am not seeing improvement with urinary leakage. When I laugh I do not leak as much. After I urinated then get up I feel a bubble of urine leak up and get into the labia. Decrease leakage with bending forward. Going from sit to stand leak urine.     Pertinent History  ovarian cancer with complete hysterectomy 09/01/16 and SLNB, completed chemotherapy on 02/23/17    Patient Stated Goals  to get stronger; reduce pain and improve urinary leakage    Currently in Pain?  Yes    Pain Score  4     Pain Location  Knee    Pain Orientation  Right;Left    Pain Descriptors / Indicators  Aching    Pain Type  Chronic pain    Pain Onset  More than a month ago    Pain Frequency  Constant    Aggravating Factors   full left knee extension    Pain Relieving Factors  heat, rest  Pain Score  2 worse 4/10    Pain Location  Pelvis suprapubic    Pain Orientation  Mid;Lower    Pain Descriptors / Indicators  Aching    Pain Type  Chronic pain    Pain Onset  More than a month ago    Pain Frequency  Constant    Aggravating Factors   intercourse, daily activities    Pain Relieving Factors  hot tub                   Pelvic Floor Special Questions - 05/13/17 0001    Pelvic Floor Internal Exam  Patient confirms indentification and approves PT to assess pelvic floor integrity and treatment    Exam Type  Vaginal        [facility] Adult PT Treatment/Exercise - 05/13/17 0001      Manual Therapy   Manual Therapy  Soft tissue mobilization    Soft tissue mobilization  left hip adductor, left upper quad, left iliacus, left psoas, and along the left ITB             PT Education - 05/13/17 0950    Education provided  Yes     Education Details  pelvic floor contraction with exercise    Person(s) Educated  Patient    Methods  Explanation;Demonstration;Verbal cues;Handout    Comprehension  Returned demonstration;Verbalized understanding       PT Short Term Goals - 05/10/17 0912      PT SHORT TERM GOAL #1   Title  Pt will be able to complete 9 sit to stands in 30 sec to decrease risk of falls    Baseline  7, 04/28/17- 9    Time  4    Period  Weeks    Status  Achieved      PT SHORT TERM GOAL #2   Title  Pt will demonstrate 3+/5 hip flexor strength to decrease risk of falls when ambulating    Baseline  3/5, 04/28/17- 3/5, 05/10/17- 3+/5 bilaterally    Time  4    Period  Weeks    Status  Achieved      PT SHORT TERM GOAL #3   Title  Pt will report a 25% improvement in left knee pain to allow for improved mobility    Baseline  04/28/17- 50% improvement    Time  4    Period  Weeks    Status  Achieved        PT Long Term Goals - 05/10/17 0914      PT LONG TERM GOAL #1   Title  Pt will be independent in a home exercise program for continued strengthening and stretching    Time  8    Period  Weeks    Status  On-going      PT LONG TERM GOAL #2   Title  Pt will demonstrate 4/5 knee flexor strength to decrease risk of falls    Baseline  3/5, 04/28/17- 3/5, 05/10/17- R 4/5, L 3+/5    Time  8    Period  Weeks    Status  On-going      PT LONG TERM GOAL #3   Title  Pt will report 75% improvement in left knee pain to allow pt to complete her job using proper body mechanincs    Baseline  04/28/17- 50% improvement, 05/10/17- 50-60% improvement    Time  8    Period  Weeks    Status  On-going  PT LONG TERM GOAL #4   Title  Pt will demonstrate 4/5 bilateral ankle dorsiflexor strength to decrease risk of falls    Baseline  3/5, 04/28/17- R 4/5, L 3/5, 05/10/17- 3/5    Time  8    Period  Weeks    Status  On-going      PT LONG TERM GOAL #5   Title  Pt will be able to complete 13 sit to stands in 30 seconds  to decrease risk of falls    Baseline  7, 04/28/17- 9, 05/10/17- 10     Time  8    Period  Weeks    Status  On-going            Plan - 05/13/17 1028    Clinical Impression Statement  Patient pain is more suprapubic area than dispersed along the lower abdomen. Patient has less leakage with coughing and laughing. Trigger points in the left hip adductors, iliopsoas. Patient will benefit from skilled therapy to improve strength and reduce pain to improve function.     Rehab Potential  Excellent    Clinical Impairments Affecting Rehab Potential  pt with increased left knee and right shoulder pain    PT Frequency  2x / week    PT Duration  8 weeks    PT Next Visit Plan  work on right pelvic floor with pelvic floor strength with hip exercise    PT Home Exercise Plan  progress as needed    Consulted and Agree with Plan of Care  Patient       Patient will benefit from skilled therapeutic intervention in order to improve the following deficits and impairments:  Decreased balance, Decreased endurance, Decreased mobility, Difficulty walking, Impaired sensation, Decreased strength, Pain, Postural dysfunction, Increased edema, Increased muscle spasms, Increased fascial restricitons  Visit Diagnosis: Muscle weakness (generalized)  Unspecified lack of coordination  Other muscle spasm     Problem List Patient Active Problem List   Diagnosis Date Noted  . Physical deconditioning 03/25/2017  . Obesity (BMI 30.0-34.9) 03/25/2017  . Drug-induced hyperglycemia 12/23/2016  . Port catheter in place 12/02/2016  . Dysuria 12/02/2016  . 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 Acute rhinitis 11/04/2016  . Goals of care, counseling/discussion 11/04/2016  . 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 Endometrial cancer ([place]) 08/24/2016    Earlie Counts, PT 05/13/17 10:37 AM   [facility] [address], [place], [place], [address] Phone: [phone]   Fax:  [phone]  Name: Danielle Guzman MRN: [id number] Date of Birth: 07/19/1965

## 2017-05-14 NOTE — Therapy (Signed)
[place], [place], [address] Phone: [phone]   Fax:  [phone]  Physical Therapy Treatment  Patient Details  Name: Danielle Guzman MRN: [id number] Date of Birth: [birth year] Referring Provider: Dr. Heath Lark   Encounter Date: 05/12/2017  PT End of Session - 05/13/17 1026    Visit Number  24    Number of Visits  30    Date for PT Re-Evaluation  05/24/17    Authorization Type  BCBS/ coinsurance 30 visit limit combine with PT/OT/Chiropractor    Authorization - Visit Number  24    Authorization - Number of Visits  30    PT Start Time  0930    PT Stop Time  1011    PT Time Calculation (min)  41 min    Activity Tolerance  Patient tolerated treatment well    Behavior During Therapy  [facility] for tasks assessed/performed       Past Medical History:  Diagnosis Date  . Cancer ([place]) 2018   endometrial   . Headache    hx migraines yrs ago  . Malaria as child  . Miscarriage    x 2  . Pneumonia yrs ago  . Seizures ([place])    petite mal seizure in high school x 1, none since    Past Surgical History:  Procedure Laterality Date  . DENTAL SURGERY     gum graft 20 yrs ago and again on 08/26/16  . DILATATION & CURETTAGE/HYSTEROSCOPY WITH MYOSURE N/A 08/11/2016   Procedure: DILATATION & CURETTAGE/HYSTEROSCOPY WITH MYOSURE;  Surgeon: Jerelyn Charles, MD;  Location: [facility];  Service: Gynecology;  Laterality: N/A;  polyp removal  . IR FLUORO GUIDE PORT INSERTION RIGHT  11/10/2016  . IR REMOVAL TUN ACCESS W/ PORT W/O FL MOD SED  03/11/2017  . IR US GUIDE VASC ACCESS RIGHT  11/10/2016  . LYMPH NODE BIOPSY N/A 09/01/2016   Procedure: SENTINEL LYMPH NODE BIOPSY;  Surgeon: Everitt Amber, MD;  Location: [facility];  Service: Gynecology;  Laterality: N/A;  . ROBOTIC ASSISTED TOTAL HYSTERECTOMY WITH BILATERAL SALPINGO OOPHERECTOMY Bilateral 09/01/2016   Procedure: ROBOTIC ASSISTED TOTAL HYSTERECTOMY WITH BILATERAL SALPINGO  OOPHORECTOMY;  Surgeon: Everitt Amber, MD;  Location: [facility];  Service: Gynecology;  Laterality: Bilateral;  . WISDOM TOOTH EXTRACTION      There were no vitals filed for this visit.  Subjective Assessment - 05/13/17 0928    Subjective  Less ache in pelvis.  I am doing my exercises. I am not seeing improvement with urinary leakage. When I laugh I do not leak as much. After I urinated then get up I feel a bubble of urine leak up and get into the labia. Decrease leakage with bending forward. Going from sit to stand leak urine.     Pertinent History  ovarian cancer with complete hysterectomy 09/01/16 and SLNB, completed chemotherapy on 02/23/17    Patient Stated Goals  to get stronger; reduce pain and improve urinary leakage    Currently in Pain?  Yes    Pain Score  4     Pain Location  Knee    Pain Orientation  Right;Left    Pain Descriptors / Indicators  Aching    Pain Type  Chronic pain    Pain Onset  More than a month ago    Pain Frequency  Constant    Aggravating Factors   full left knee extension    Pain Relieving Factors  heat, rest  Pain Score  2 worse 4/10    Pain Location  Pelvis suprapubic    Pain Orientation  Mid;Lower    Pain Descriptors / Indicators  Aching    Pain Type  Chronic pain    Pain Onset  More than a month ago    Pain Frequency  Constant    Aggravating Factors   intercourse, daily activities    Pain Relieving Factors  hot tub                      [facility] Adult PT Treatment/Exercise - 05/14/17 0001      Neuro Re-ed    Neuro Re-ed Details   standing on Airex and marching x 10 reps with 1 lb ankle weights, mini squats on airex keeping even weight through entire foot with verbal cues not to pull toes up, standing on 1 leg on airex x 11 and 15 sec bilaterally, standing 3 way hip with 1 lb ankle weights on airex x 10 reps each      Knee/Hip Exercises: Aerobic   Recumbent Bike  Level 3, 8 minutes RPE- 7/10      Knee/Hip Exercises: Seated   Long Arc  Quad  Strengthening;10 reps;Weights    Long Arc Quad Weight  4 lbs.      Ankle Exercises: Seated   Other Seated Ankle Exercises  seated ankle DF, PF, Eversion and Inversion all with green theraband x 20 reps each bilaterally             PT Education - 05/13/17 0950    Education provided  Yes    Education Details  pelvic floor contraction with exercise    Person(s) Educated  Patient    Methods  Explanation;Demonstration;Verbal cues;Handout    Comprehension  Returned demonstration;Verbalized understanding       PT Short Term Goals - 05/10/17 0912      PT SHORT TERM GOAL #1   Title  Pt will be able to complete 9 sit to stands in 30 sec to decrease risk of falls    Baseline  7, 04/28/17- 9    Time  4    Period  Weeks    Status  Achieved      PT SHORT TERM GOAL #2   Title  Pt will demonstrate 3+/5 hip flexor strength to decrease risk of falls when ambulating    Baseline  3/5, 04/28/17- 3/5, 05/10/17- 3+/5 bilaterally    Time  4    Period  Weeks    Status  Achieved      PT SHORT TERM GOAL #3   Title  Pt will report a 25% improvement in left knee pain to allow for improved mobility    Baseline  04/28/17- 50% improvement    Time  4    Period  Weeks    Status  Achieved        PT Long Term Goals - 05/10/17 0914      PT LONG TERM GOAL #1   Title  Pt will be independent in a home exercise program for continued strengthening and stretching    Time  8    Period  Weeks    Status  On-going      PT LONG TERM GOAL #2   Title  Pt will demonstrate 4/5 knee flexor strength to decrease risk of falls    Baseline  3/5, 04/28/17- 3/5, 05/10/17- R 4/5, L 3+/5    Time  8  Period  Weeks    Status  On-going      PT LONG TERM GOAL #3   Title  Pt will report 75% improvement in left knee pain to allow pt to complete her job using proper body mechanincs    Baseline  04/28/17- 50% improvement, 05/10/17- 50-60% improvement    Time  8    Period  Weeks    Status  On-going      PT LONG  TERM GOAL #4   Title  Pt will demonstrate 4/5 bilateral ankle dorsiflexor strength to decrease risk of falls    Baseline  3/5, 04/28/17- R 4/5, L 3/5, 05/10/17- 3/5    Time  8    Period  Weeks    Status  On-going      PT LONG TERM GOAL #5   Title  Pt will be able to complete 13 sit to stands in 30 seconds to decrease risk of falls    Baseline  7, 04/28/17- 9, 05/10/17- 10     Time  8    Period  Weeks    Status  On-going            Plan - 05/13/17 1028    Clinical Impression Statement  Patient pain is more suprapubic area than dispersed along the lower abdomen. Patient has less leakage with coughing and laughing. Trigger points in the left hip adductors, iliopsoas. Patient will benefit from skilled therapy to improve strength and reduce pain to improve function.     Rehab Potential  Excellent    Clinical Impairments Affecting Rehab Potential  pt with increased left knee and right shoulder pain    PT Frequency  2x / week    PT Duration  8 weeks    PT Next Visit Plan  work on right pelvic floor with pelvic floor strength with hip exercise    PT Home Exercise Plan  progress as needed    Consulted and Agree with Plan of Care  Patient       Patient will benefit from skilled therapeutic intervention in order to improve the following deficits and impairments:  Decreased balance, Decreased endurance, Decreased mobility, Difficulty walking, Impaired sensation, Decreased strength, Pain, Postural dysfunction, Increased edema, Increased muscle spasms, Increased fascial restricitons  Visit Diagnosis: Muscle weakness (generalized)  Difficulty in walking, not elsewhere classified  Chronic pain of left knee     Problem List Patient Active Problem List   Diagnosis Date Noted  . Physical deconditioning 03/25/2017  . Obesity (BMI 30.0-34.9) 03/25/2017  . Drug-induced hyperglycemia 12/23/2016  . Port catheter in place 12/02/2016  . Dysuria 12/02/2016  . Peripheral neuropathy due to  chemotherapy ([place]) 12/02/2016  . Acute rhinitis 11/04/2016  . Goals of care, counseling/discussion 11/04/2016  . 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 Endometrial cancer ([place]) 08/24/2016    Allyson Sabal [facility] 05/14/2017, 12:03 PM  [place], [place], [address] Phone: [phone]   Fax:  [phone]  Name: ELLISON LEISURE MRN: [id number] Date of Birth: 03-02-1966  Manus Gunning, PT 05/14/17 12:03 PM

## 2017-05-17 ENCOUNTER — Encounter: Payer: Self-pay | Admitting: Physical Therapy

## 2017-05-17 ENCOUNTER — Ambulatory Visit: Payer: BLUE CROSS/BLUE SHIELD | Attending: Hematology and Oncology | Admitting: Physical Therapy

## 2017-05-17 DIAGNOSIS — G8929 Other chronic pain: Secondary | ICD-10-CM | POA: Diagnosis present

## 2017-05-17 DIAGNOSIS — M25562 Pain in left knee: Secondary | ICD-10-CM | POA: Diagnosis present

## 2017-05-17 DIAGNOSIS — M6281 Muscle weakness (generalized): Secondary | ICD-10-CM | POA: Diagnosis present

## 2017-05-17 DIAGNOSIS — R262 Difficulty in walking, not elsewhere classified: Secondary | ICD-10-CM | POA: Diagnosis present

## 2017-05-17 NOTE — Therapy (Signed)
[place], [place], [address] Phone: [phone]   Fax:  [phone]  Physical Therapy Treatment  Patient Details  Name: Danielle Guzman MRN: [id number] Date of Birth: 06-May-1965 Referring Provider: Dr. Heath Lark   Encounter Date: 05/17/2017  PT End of Session - 05/17/17 0946    Visit Number  26    Number of Visits  30    Date for PT Re-Evaluation  05/24/17    Authorization Type  BCBS/ coinsurance 30 visit limit combine with PT/OT/Chiropractor    Authorization - Visit Number  26    Authorization - Number of Visits  30    PT Start Time  6283    PT Stop Time  0945    PT Time Calculation (min)  56 min    Activity Tolerance  Patient tolerated treatment well    Behavior During Therapy  [facility] for tasks assessed/performed       Past Medical History:  Diagnosis Date  . Cancer ([place]) 2018   endometrial   . Headache    hx migraines yrs ago  . Malaria as child  . Miscarriage    x 2  . Pneumonia yrs ago  . Seizures ([place])    petite mal seizure in high school x 1, none since    Past Surgical History:  Procedure Laterality Date  . DENTAL SURGERY     gum graft 20 yrs ago and again on 08/26/16  . DILATATION & CURETTAGE/HYSTEROSCOPY WITH MYOSURE N/A 08/11/2016   Procedure: DILATATION & CURETTAGE/HYSTEROSCOPY WITH MYOSURE;  Surgeon: Jerelyn Charles, MD;  Location: [facility];  Service: Gynecology;  Laterality: N/A;  polyp removal  . IR FLUORO GUIDE PORT INSERTION RIGHT  11/10/2016  . IR REMOVAL TUN ACCESS W/ PORT W/O FL MOD SED  03/11/2017  . IR US GUIDE VASC ACCESS RIGHT  11/10/2016  . LYMPH NODE BIOPSY N/A 09/01/2016   Procedure: SENTINEL LYMPH NODE BIOPSY;  Surgeon: Everitt Amber, MD;  Location: [facility];  Service: Gynecology;  Laterality: N/A;  . ROBOTIC ASSISTED TOTAL HYSTERECTOMY WITH BILATERAL SALPINGO OOPHERECTOMY Bilateral 09/01/2016   Procedure: ROBOTIC ASSISTED TOTAL HYSTERECTOMY WITH BILATERAL SALPINGO  OOPHORECTOMY;  Surgeon: Everitt Amber, MD;  Location: [facility];  Service: Gynecology;  Laterality: Bilateral;  . WISDOM TOOTH EXTRACTION      There were no vitals filed for this visit.  Subjective Assessment - 05/17/17 0850    Subjective  My knee pain is better today. It is hard for me to get up and down from a chair. It is down to a 2 or 3 but yesterday morning it was a 4 or 5. I think it was from increasing the weights.     Pertinent History  ovarian cancer with complete hysterectomy 09/01/16 and SLNB, completed chemotherapy on 02/23/17    Patient Stated Goals  to improve LE strength    Currently in Pain?  Yes    Pain Score  3     Pain Location  Knee    Pain Orientation  Left;Right    Pain Descriptors / Indicators  Aching                      [facility] Adult PT Treatment/Exercise - 05/17/17 0001      Neuro Re-ed    Neuro Re-ed Details   standing on Airex and marching x 10 reps with 1 lb ankle weights, mini squats on airex keeping even weight through entire foot with verbal  cues not to pull toes up, standing on 1 leg on airex x 20 and 30 sec bilaterally, standing 3 way hip with 1 lb ankle weights on airex x 10 reps each      Knee/Hip Exercises: Aerobic   Recumbent Bike  Level 3, 8 minutes RPE- 7-8/10      Knee/Hip Exercises: Seated   Abduction/Adduction   Strengthening;Both;2 sets;10 reps with green theraband focusing on diaphragmatic breathing     Sit to Sand  1 set;10 reps;without UE support with green theraband around knees and pt pushing outwards      Ankle Exercises: Seated   Other Seated Ankle Exercises  seated ankle DF, PF, Eversion and Inversion all with blue theraband x 20 reps each bilaterally               PT Short Term Goals - 05/10/17 0912      PT SHORT TERM GOAL #1   Title  Pt will be able to complete 9 sit to stands in 30 sec to decrease risk of falls    Baseline  7, 04/28/17- 9    Time  4    Period  Weeks    Status  Achieved      PT SHORT TERM  GOAL #2   Title  Pt will demonstrate 3+/5 hip flexor strength to decrease risk of falls when ambulating    Baseline  3/5, 04/28/17- 3/5, 05/10/17- 3+/5 bilaterally    Time  4    Period  Weeks    Status  Achieved      PT SHORT TERM GOAL #3   Title  Pt will report a 25% improvement in left knee pain to allow for improved mobility    Baseline  04/28/17- 50% improvement    Time  4    Period  Weeks    Status  Achieved        PT Long Term Goals - 05/10/17 0914      PT LONG TERM GOAL #1   Title  Pt will be independent in a home exercise program for continued strengthening and stretching    Time  8    Period  Weeks    Status  On-going      PT LONG TERM GOAL #2   Title  Pt will demonstrate 4/5 knee flexor strength to decrease risk of falls    Baseline  3/5, 04/28/17- 3/5, 05/10/17- R 4/5, L 3+/5    Time  8    Period  Weeks    Status  On-going      PT LONG TERM GOAL #3   Title  Pt will report 75% improvement in left knee pain to allow pt to complete her job using proper body mechanincs    Baseline  04/28/17- 50% improvement, 05/10/17- 50-60% improvement    Time  8    Period  Weeks    Status  On-going      PT LONG TERM GOAL #4   Title  Pt will demonstrate 4/5 bilateral ankle dorsiflexor strength to decrease risk of falls    Baseline  3/5, 04/28/17- R 4/5, L 3/5, 05/10/17- 3/5    Time  8    Period  Weeks    Status  On-going      PT LONG TERM GOAL #5   Title  Pt will be able to complete 13 sit to stands in 30 seconds to decrease risk of falls    Baseline  7, 04/28/17- 9, 05/10/17- 10  Time  8    Period  Weeks    Status  On-going            Plan - 05/17/17 0948    Clinical Impression Statement  Patient continues to progress in therapy. She is still having ongoing knee pain when standing from a chair. Observed pt standing from chair and she demonstrated genu valgum at her knees with increased pronation. Educated to stand from chair while pushing outwards on theraband to decrease  valgus. Pt felt some decrease in pain with this. Also educated pt to apply equal weight through entire foot when standing. Issued seated hip abduction exercise today as well as sit to stand with theraband around knees. Increased resistance to blue theraband for ankle strengthening exercise. Will assess goals at next session.     Rehab Potential  Excellent    Clinical Impairments Affecting Rehab Potential  pt with increased left knee and right shoulder pain    PT Frequency  2x / week    PT Duration  8 weeks    PT Treatment/Interventions  ADLs/Self Care Home Management;Electrical Stimulation;Neuromuscular re-education;Therapeutic exercise;Balance training;Manual techniques;Patient/family education;Therapeutic activities    PT Next Visit Plan  continue LE strengthening and work on decreasing knee pain, see if pt contacted [employer] program    Consulted and Agree with Plan of Care  Patient       Patient will benefit from skilled therapeutic intervention in order to improve the following deficits and impairments:  Decreased balance, Decreased endurance, Decreased mobility, Difficulty walking, Impaired sensation, Decreased strength, Pain, Postural dysfunction, Increased edema, Increased muscle spasms, Increased fascial restricitons  Visit Diagnosis: Muscle weakness (generalized)  Difficulty in walking, not elsewhere classified  Chronic pain of left knee     Problem List Patient Active Problem List   Diagnosis Date Noted  . Physical deconditioning 03/25/2017  . Obesity (BMI 30.0-34.9) 03/25/2017  . Drug-induced hyperglycemia 12/23/2016  . Port catheter in place 12/02/2016  . Dysuria 12/02/2016  . Peripheral neuropathy due to chemotherapy ([place]) 12/02/2016  . Acute rhinitis 11/04/2016  . Goals of care, counseling/discussion 11/04/2016  . 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 Endometrial cancer ([place]) 08/24/2016    Allyson Sabal [facility] 05/17/2017, 9:55  AM  [place], [place], [address] Phone: [phone]   Fax:  [phone]  Name: Danielle Guzman MRN: [id number] Date of Birth: 16-Jul-1965  Manus Gunning, PT 05/17/17 9:55 AM

## 2017-05-18 ENCOUNTER — Encounter: Payer: Self-pay | Admitting: Physical Therapy

## 2017-05-18 ENCOUNTER — Ambulatory Visit: Payer: BLUE CROSS/BLUE SHIELD | Attending: Hematology and Oncology | Admitting: Physical Therapy

## 2017-05-18 DIAGNOSIS — R279 Unspecified lack of coordination: Secondary | ICD-10-CM | POA: Diagnosis present

## 2017-05-18 DIAGNOSIS — M6281 Muscle weakness (generalized): Secondary | ICD-10-CM | POA: Diagnosis not present

## 2017-05-18 DIAGNOSIS — M62838 Other muscle spasm: Secondary | ICD-10-CM | POA: Insufficient documentation

## 2017-05-18 NOTE — Therapy (Signed)
[facility] [address], [place], [place], [address] Phone: [phone]   Fax:  [phone]  Physical Therapy Treatment  Patient Details  Name: Danielle Guzman MRN: [id number] Date of Birth: 04/12/1965 Referring Provider: Dr. Heath Lark   Encounter Date: 05/18/2017  PT End of Session - 05/18/17 0947    Visit Number  27    Number of Visits  30    Date for PT Re-Evaluation  05/24/17    Authorization Type  BCBS/ coinsurance 30 visit limit combine with PT/OT/Chiropractor    Authorization - Visit Number  27    Authorization - Number of Visits  30    PT Start Time  0845    PT Stop Time  0945    PT Time Calculation (min)  60 min    Activity Tolerance  Patient tolerated treatment well    Behavior During Therapy  [facility] for tasks assessed/performed       Past Medical History:  Diagnosis Date  . Cancer ([place]) 2018   endometrial   . Headache    hx migraines yrs ago  . Malaria as child  . Miscarriage    x 2  . Pneumonia yrs ago  . Seizures ([place])    petite mal seizure in high school x 1, none since    Past Surgical History:  Procedure Laterality Date  . DENTAL SURGERY     gum graft 20 yrs ago and again on 08/26/16  . DILATATION & CURETTAGE/HYSTEROSCOPY WITH MYOSURE N/A 08/11/2016   Procedure: DILATATION & CURETTAGE/HYSTEROSCOPY WITH MYOSURE;  Surgeon: Jerelyn Charles, MD;  Location: [facility];  Service: Gynecology;  Laterality: N/A;  polyp removal  . IR FLUORO GUIDE PORT INSERTION RIGHT  11/10/2016  . IR REMOVAL TUN ACCESS W/ PORT W/O FL MOD SED  03/11/2017  . IR US GUIDE VASC ACCESS RIGHT  11/10/2016  . LYMPH NODE BIOPSY N/A 09/01/2016   Procedure: SENTINEL LYMPH NODE BIOPSY;  Surgeon: Everitt Amber, MD;  Location: [facility];  Service: Gynecology;  Laterality: N/A;  . ROBOTIC ASSISTED TOTAL HYSTERECTOMY WITH BILATERAL SALPINGO OOPHERECTOMY Bilateral 09/01/2016   Procedure: ROBOTIC ASSISTED TOTAL HYSTERECTOMY WITH BILATERAL SALPINGO  OOPHORECTOMY;  Surgeon: Everitt Amber, MD;  Location: [facility];  Service: Gynecology;  Laterality: Bilateral;  . WISDOM TOOTH EXTRACTION      There were no vitals filed for this visit.  Subjective Assessment - 05/18/17 0851    Subjective  I still have urinary leakage but not as often and decreased in leak. My pad is not as wet.     Pertinent History  ovarian cancer with complete hysterectomy 09/01/16 and SLNB, completed chemotherapy on 02/23/17    Patient Stated Goals  to improve LE strength    Currently in Pain?  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    Multiple Pain Sites  Yes    Pain Score  2 1-4/10  Pain Location  Abdomen    Pain Orientation  Lower;Mid    Pain Descriptors / Indicators  Aching    Pain Type  Chronic pain    Pain Onset  More than a month ago    Pain Frequency  Constant    Aggravating Factors   intercourse, daily activities    Pain Relieving Factors  hot tub                   Pelvic Floor Special Questions - 05/18/17 0001    Pelvic Floor Internal Exam  Patient confirms indentification and approves PT to assess pelvic floor integrity and treatment    Exam Type  Vaginal    Strength  fair squeeze, definite lift strength fluctuates between 3-4/5        [facility] Adult PT Treatment/Exercise - 05/18/17 0001      Self-Care   Self-Care  Other Self-Care Comments    Other Self-Care Comments   dicussed how to use the jade eggs, working on a progression      Manual Therapy   Manual Therapy  Soft tissue mobilization;Internal Pelvic Floor    Soft tissue  mobilization  right hip adductore , quada and hamstring    Internal Pelvic Floor  right obturator internist, urethra sphincter, bil. levator ani with pulsating of the tissue due to releases             PT Education - 05/18/17 0946    Education provided  Yes    Education Details  dicussed how to progress using the jade eggs for strength    Person(s) Educated  Patient    Methods  Explanation    Comprehension  Verbalized understanding       PT Short Term Goals - 05/18/17 0951      PT SHORT TERM GOAL #5   Title  ability to have penile penetration with her husband with pain decreased >/= 20% better     Baseline  10% better    Time  8    Period  Weeks    Status  On-going      PT SHORT TERM GOAL #6   Title  urinary leakage decreased >/= 25% and is down to 2 light pad per day due to increased pelvic floor strength    Time  8    Period  Weeks    Status  Achieved        PT Long Term Goals - 05/10/17 3267      PT LONG TERM GOAL #1   Title  Pt will be independent in a home exercise program for continued strengthening and stretching    Time  8    Period  Weeks    Status  On-going      PT LONG TERM GOAL #2   Title  Pt will demonstrate 4/5 knee flexor strength to decrease risk of falls    Baseline  3/5, 04/28/17- 3/5, 05/10/17- R 4/5, L 3+/5    Time  8    Period  Weeks    Status  On-going      PT LONG TERM GOAL #3   Title  Pt will report 75% improvement in left knee pain to allow pt to complete her job using proper body mechanincs    Baseline  04/28/17- 50% improvement, 05/10/17- 50-60% improvement    Time  8    Period  Weeks    Status  On-going      PT LONG TERM GOAL #  4   Title  Pt will demonstrate 4/5 bilateral ankle dorsiflexor strength to decrease risk of falls    Baseline  3/5, 04/28/17- R 4/5, L 3/5, 05/10/17- 3/5    Time  8    Period  Weeks    Status  On-going      PT LONG TERM GOAL #5   Title  Pt will be able to complete 13 sit to stands in 30 seconds to  decrease risk of falls    Baseline  7, 04/28/17- 9, 05/10/17- 10     Time  8    Period  Weeks    Status  On-going            Plan - 05/18/17 0948    Clinical Impression Statement  After manual work ,patient pain level decreased to 1/10.  Patient had increased trigger points in the hip adductor muscles especially brevis and pectineus.  Patient has less spikes of pain during the day.  Patient has less urinary leakage due to her pads being less wet.  Patient pelvic floor strength will fluctuated between 3-4/10 depending on the day.  Patient will benefit from skilled therapy to improve tissue mobility to improve function while reducing pain.     Rehab Potential  Excellent    Clinical Impairments Affecting Rehab Potential  pt with increased left knee and right shoulder pain    PT Frequency  2x / week    PT Duration  8 weeks    PT Treatment/Interventions  ADLs/Self Care Home Management;Electrical Stimulation;Neuromuscular re-education;Therapeutic exercise;Balance training;Manual techniques;Patient/family education;Therapeutic activities    PT Next Visit Plan  work on left side of pelvis, discuss how the vaginal jade eggs are working    PT Home Exercise Plan  progress as needed    Consulted and Agree with Plan of Care  Patient       Patient will benefit from skilled therapeutic intervention in order to improve the following deficits and impairments:  Decreased balance, Decreased endurance, Decreased mobility, Difficulty walking, Impaired sensation, Decreased strength, Pain, Postural dysfunction, Increased edema, Increased muscle spasms, Increased fascial restricitons  Visit Diagnosis: Muscle weakness (generalized)  Unspecified lack of coordination     Problem List Patient Active Problem List   Diagnosis Date Noted  . Physical deconditioning 03/25/2017  . Obesity (BMI 30.0-34.9) 03/25/2017  . Drug-induced hyperglycemia 12/23/2016  . Port catheter in place 12/02/2016  . Dysuria  12/02/2016  . Peripheral neuropathy due to chemotherapy ([place]) 12/02/2016  . Acute rhinitis 11/04/2016  . Goals of care, counseling/discussion 11/04/2016  . 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 Endometrial cancer ([place]) 08/24/2016    Earlie Counts, PT 05/18/17 9:52 AM   [facility] [address] [place], [place], [address] Phone: [phone]   Fax:  [phone]  Name: Danielle Guzman MRN: [id number] Date of Birth: 1965-11-29

## 2017-05-19 ENCOUNTER — Ambulatory Visit: Payer: BLUE CROSS/BLUE SHIELD | Admitting: Physical Therapy

## 2017-05-19 ENCOUNTER — Encounter: Payer: Self-pay | Admitting: Physical Therapy

## 2017-05-19 DIAGNOSIS — M6281 Muscle weakness (generalized): Secondary | ICD-10-CM

## 2017-05-19 DIAGNOSIS — R262 Difficulty in walking, not elsewhere classified: Secondary | ICD-10-CM

## 2017-05-19 DIAGNOSIS — G8929 Other chronic pain: Secondary | ICD-10-CM

## 2017-05-19 DIAGNOSIS — M25562 Pain in left knee: Secondary | ICD-10-CM

## 2017-05-19 NOTE — Therapy (Signed)
[place], [place], [address] Phone: [phone]   Fax:  [phone]  Physical Therapy Treatment  Patient Details  Name: Danielle Guzman MRN: [id number] Date of Birth: [birth year] Referring Provider: Dr. Heath Lark   Encounter Date: 05/19/2017  PT End of Session - 05/19/17 1023    Visit Number  28    Number of Visits  30    Date for PT Re-Evaluation  05/24/17    Authorization Type  BCBS/ coinsurance 30 visit limit combine with PT/OT/Chiropractor    Authorization - Visit Number  28    Authorization - Number of Visits  30    PT Start Time  [phone]    PT Stop Time  0933    PT Time Calculation (min)  42 min    Activity Tolerance  Patient tolerated treatment well    Behavior During Therapy  [facility] for tasks assessed/performed       Past Medical History:  Diagnosis Date  . Cancer ([place]) 2018   endometrial   . Headache    hx migraines yrs ago  . Malaria as child  . Miscarriage    x 2  . Pneumonia yrs ago  . Seizures ([place])    petite mal seizure in high school x 1, none since    Past Surgical History:  Procedure Laterality Date  . DENTAL SURGERY     gum graft 20 yrs ago and again on 08/26/16  . DILATATION & CURETTAGE/HYSTEROSCOPY WITH MYOSURE N/A 08/11/2016   Procedure: DILATATION & CURETTAGE/HYSTEROSCOPY WITH MYOSURE;  Surgeon: Jerelyn Charles, MD;  Location: [facility];  Service: Gynecology;  Laterality: N/A;  polyp removal  . IR FLUORO GUIDE PORT INSERTION RIGHT  11/10/2016  . IR REMOVAL TUN ACCESS W/ PORT W/O FL MOD SED  03/11/2017  . IR US GUIDE VASC ACCESS RIGHT  11/10/2016  . LYMPH NODE BIOPSY N/A 09/01/2016   Procedure: SENTINEL LYMPH NODE BIOPSY;  Surgeon: Everitt Amber, MD;  Location: [facility];  Service: Gynecology;  Laterality: N/A;  . ROBOTIC ASSISTED TOTAL HYSTERECTOMY WITH BILATERAL SALPINGO OOPHERECTOMY Bilateral 09/01/2016   Procedure: ROBOTIC ASSISTED TOTAL HYSTERECTOMY WITH BILATERAL SALPINGO  OOPHORECTOMY;  Surgeon: Everitt Amber, MD;  Location: [facility];  Service: Gynecology;  Laterality: Bilateral;  . WISDOM TOOTH EXTRACTION      There were no vitals filed for this visit.  Subjective Assessment - 05/19/17 0853    Subjective  I left a message with Dr. Alvy Bimler nurse about my knee. I could not get in touch with [employer] yet.     Pertinent History  ovarian cancer with complete hysterectomy 09/01/16 and SLNB, completed chemotherapy on 02/23/17    Patient Stated Goals  to improve LE strength    Currently in Pain?  Yes    Pain Score  2     Pain Location  Knee    Pain Orientation  Right;Left    Pain Descriptors / Indicators  Aching    Pain Type  Chronic pain                      [facility] Adult PT Treatment/Exercise - 05/19/17 0001      Neuro Re-ed    Neuro Re-ed Details   standing on Airex and marching x 10 reps with 2 lb ankle weights, mini squats on airex keeping even weight through entire foot with verbal cues not to pull toes up, standing on 1 leg on airex x 30 sec and  42 sec bilaterally, standing 3 way hip with 2 lb ankle weights on airex x 10 reps each      Knee/Hip Exercises: Aerobic   Recumbent Bike  Level 3, 10 minutes RPE- 8/10      Knee/Hip Exercises: Seated   Abduction/Adduction   Strengthening;Both;2 sets;10 reps with blue theraband focusing on diaphragmatic breathing              PT Education - 05/18/17 0946    Education provided  Yes    Education Details  dicussed how to progress using the jade eggs for strength    Person(s) Educated  Patient    Methods  Explanation    Comprehension  Verbalized understanding       PT Short Term Goals - 05/19/17 0855      PT SHORT TERM GOAL #1   Title  Pt will be able to complete 9 sit to stands in 30 sec to decrease risk of falls    Baseline  7, 04/28/17- 9    Time  4    Period  Weeks    Status  Achieved      PT SHORT TERM GOAL #2   Title  Pt will demonstrate 3+/5 hip flexor strength to decrease  risk of falls when ambulating    Baseline  3/5, 04/28/17- 3/5, 05/10/17- 3+/5 bilaterally    Time  4    Period  Weeks    Status  Achieved      PT SHORT TERM GOAL #3   Title  Pt will report a 25% improvement in left knee pain to allow for improved mobility    Baseline  04/28/17- 50% improvement    Time  4    Period  Weeks    Status  Achieved        PT Long Term Goals - 05/19/17 0855      PT LONG TERM GOAL #1   Title  Pt will be independent in a home exercise program for continued strengthening and stretching    Time  8    Period  Weeks    Status  On-going      PT LONG TERM GOAL #2   Title  Pt will demonstrate 4/5 knee flexor strength to decrease risk of falls    Baseline  3/5, 04/28/17- 3/5, 05/10/17- R 4/5, L 3+/5, 05/19/17- R 4/5, L 4/5    Time  8    Period  Weeks    Status  Achieved      PT LONG TERM GOAL #3   Title  Pt will report 75% improvement in left knee pain to allow pt to complete her job using proper body mechanincs    Baseline  04/28/17- 50% improvement, 05/10/17- 50-60% improvement, 05/19/17- 75% improvement    Time  8    Period  Weeks    Status  Achieved      PT LONG TERM GOAL #4   Title  Pt will demonstrate 4/5 bilateral ankle dorsiflexor strength to decrease risk of falls    Baseline  3/5, 04/28/17- R 4/5, L 3/5, 05/10/17- 3/5, 05/19/17- R 4+/5, L 4/5    Time  8    Period  Weeks    Status  Achieved      PT LONG TERM GOAL #5   Title  Pt will be able to complete 13 sit to stands in 30 seconds to decrease risk of falls    Baseline  7, 04/28/17- 9, 05/10/17- 10, 05/19/17- 11  Time  8    Period  Weeks    Status  On-going            Plan - 05/19/17 1023    Clinical Impression Statement  Assessed pt's progress towards goals in therapy. Pt has made tremendous progress towards goals. She has now met all of her long term strength goals. She has made progress towards her 30 sec sit to stand goal. Pt is progressing towards discharge from therapy. She will be ready once  she has met the rest of her goals and has [employer] set up to transition to after discharge. She will be following up to sign up for [employer]. Pt was able to add 2 min to the bike today at level 3 though she was very fatigued at the end.     Rehab Potential  Excellent    Clinical Impairments Affecting Rehab Potential  pt with increased left knee and right shoulder pain    PT Frequency  2x / week    PT Duration  8 weeks    PT Treatment/Interventions  ADLs/Self Care Home Management;Electrical Stimulation;Neuromuscular re-education;Therapeutic exercise;Balance training;Manual techniques;Patient/family education;Therapeutic activities    PT Next Visit Plan  continue LE strengthening, sit to stands, balance    Consulted and Agree with Plan of Care  Patient       Patient will benefit from skilled therapeutic intervention in order to improve the following deficits and impairments:  Decreased balance, Decreased endurance, Decreased mobility, Difficulty walking, Impaired sensation, Decreased strength, Pain, Postural dysfunction, Increased edema, Increased muscle spasms, Increased fascial restricitons  Visit Diagnosis: Muscle weakness (generalized)  Difficulty in walking, not elsewhere classified  Chronic pain of left knee     Problem List Patient Active Problem List   Diagnosis Date Noted  . Physical deconditioning 03/25/2017  . Obesity (BMI 30.0-34.9) 03/25/2017  . Drug-induced hyperglycemia 12/23/2016  . Port catheter in place 12/02/2016  . Dysuria 12/02/2016  . 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 Acute rhinitis 11/04/2016  . Goals of care, counseling/discussion 11/04/2016  . 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 Endometrial cancer ([place]) 08/24/2016    Allyson Sabal [facility] 05/19/2017, 10:26 AM  [place], [place], [address] Phone: [phone]    Fax:  [phone]  Name: Danielle Guzman MRN: [id number] Date of Birth: February 04, 1966  Manus Gunning, PT 05/19/17 10:26 AM

## 2017-05-20 ENCOUNTER — Encounter: Payer: Self-pay | Admitting: Physical Therapy

## 2017-05-20 ENCOUNTER — Ambulatory Visit: Payer: BLUE CROSS/BLUE SHIELD | Admitting: Physical Therapy

## 2017-05-20 DIAGNOSIS — M62838 Other muscle spasm: Secondary | ICD-10-CM

## 2017-05-20 DIAGNOSIS — R279 Unspecified lack of coordination: Secondary | ICD-10-CM

## 2017-05-20 DIAGNOSIS — M6281 Muscle weakness (generalized): Secondary | ICD-10-CM

## 2017-05-20 NOTE — Patient Instructions (Signed)
Elevator (Hook-Lying)    Lie with hips and knees bent. Imagine pelvic floor with 2 levels. "Going up", contract pelvic floor and Hold for __2_ seconds on each level. Then "going Down", contract pelvic floor and Hold for _2__ seconds on each level. Relax. Repeat __10_ times. Do __2_ times a day.   Copyright  VHI. All rights reserved.  [facility] [address] [place], [place] [address] Phone # [phone] Fax [phone]

## 2017-05-20 NOTE — Therapy (Signed)
[facility] [address], [place], [place], [address] Phone: [phone]   Fax:  [phone]  Physical Therapy Treatment  Patient Details  Name: Danielle Guzman MRN: [id number] Date of Birth: 10-08-1965 Referring Provider: Dr. Heath Lark   Encounter Date: 05/20/2017  PT End of Session - 05/20/17 1618    Visit Number  29    Number of Visits  30    Date for PT Re-Evaluation  05/24/17    Authorization Type  BCBS/ coinsurance 30 visit limit combine with PT/OT/Chiropractor    Authorization - Visit Number  29    Authorization - Number of Visits  30    PT Start Time  4801    PT Stop Time  1700    PT Time Calculation (min)  41 min    Activity Tolerance  Patient tolerated treatment well    Behavior During Therapy  [facility] for tasks assessed/performed       Past Medical History:  Diagnosis Date  . Cancer ([place]) 2018   endometrial   . Headache    hx migraines yrs ago  . Malaria as child  . Miscarriage    x 2  . Pneumonia yrs ago  . Seizures ([place])    petite mal seizure in high school x 1, none since    Past Surgical History:  Procedure Laterality Date  . DENTAL SURGERY     gum graft 20 yrs ago and again on 08/26/16  . DILATATION & CURETTAGE/HYSTEROSCOPY WITH MYOSURE N/A 08/11/2016   Procedure: DILATATION & CURETTAGE/HYSTEROSCOPY WITH MYOSURE;  Surgeon: Jerelyn Charles, MD;  Location: [facility];  Service: Gynecology;  Laterality: N/A;  polyp removal  . IR FLUORO GUIDE PORT INSERTION RIGHT  11/10/2016  . IR REMOVAL TUN ACCESS W/ PORT W/O FL MOD SED  03/11/2017  . IR US GUIDE VASC ACCESS RIGHT  11/10/2016  . LYMPH NODE BIOPSY N/A 09/01/2016   Procedure: SENTINEL LYMPH NODE BIOPSY;  Surgeon: Everitt Amber, MD;  Location: [facility];  Service: Gynecology;  Laterality: N/A;  . ROBOTIC ASSISTED TOTAL HYSTERECTOMY WITH BILATERAL SALPINGO OOPHERECTOMY Bilateral 09/01/2016   Procedure: ROBOTIC ASSISTED TOTAL HYSTERECTOMY WITH BILATERAL SALPINGO  OOPHORECTOMY;  Surgeon: Everitt Amber, MD;  Location: [facility];  Service: Gynecology;  Laterality: Bilateral;  . WISDOM TOOTH EXTRACTION      There were no vitals filed for this visit.  Subjective Assessment - 05/20/17 1619    Subjective  I can feel the tactile around the egg as before. leakage decreased by 25% since she has been using the egg. My left buttocks pain is gone and do not have it with bridges.     Pertinent History  ovarian cancer with complete hysterectomy 09/01/16 and SLNB, completed chemotherapy on 02/23/17    Patient Stated Goals  to improve LE strength    Currently in Pain?  Yes    Pain Score  2     Pain Location  Knee    Pain Orientation  Right;Left    Pain Descriptors / Indicators  Aching    Pain Type  Chronic pain    Pain Onset  More than a month ago    Pain Frequency  Constant    Aggravating Factors   going up and down stairs, standing     Pain Relieving Factors  heat, rest    Multiple Pain Sites  Yes    Pain Score  1 pain more of 1 50% of the time, than a 2/10  Pain Location  Abdomen    Pain Orientation  Lower;Mid    Pain Descriptors / Indicators  Aching    Pain Type  Chronic pain    Pain Onset  More than a month ago    Pain Frequency  Constant    Aggravating Factors   intercourse, daily activities    Pain Relieving Factors  hot tub    Effect of Pain on Daily Activities  limits mobility                   Pelvic Floor Special Questions - 05/20/17 0001    Pelvic Floor Internal Exam  Patient confirms indentification and approves PT to assess pelvic floor integrity and treatment    Exam Type  Vaginal    Strength  good squeeze, good lift, able to hold agaisnt strong resistance        [facility] Adult PT Treatment/Exercise - 05/20/17 0001      Neuro Re-ed    Neuro Re-ed Details   pelvic floor contraction hold 5 sec and elevator contraction with index finger in the vaginal canal to give tactile cues      Manual Therapy   Manual Therapy  Internal  Pelvic Floor    Internal Pelvic Floor  left levator ani and obturator internist, bil. ischiocavernosus             PT Education - 05/20/17 1654    Education provided  Yes    Education Details  elevator with pelvic floor contraction    Person(s) Educated  Patient    Methods  Explanation;Demonstration;Verbal cues;Handout    Comprehension  Returned demonstration;Verbalized understanding       PT Short Term Goals - 05/19/17 0855      PT SHORT TERM GOAL #1   Title  Pt will be able to complete 9 sit to stands in 30 sec to decrease risk of falls    Baseline  7, 04/28/17- 9    Time  4    Period  Weeks    Status  Achieved      PT SHORT TERM GOAL #2   Title  Pt will demonstrate 3+/5 hip flexor strength to decrease risk of falls when ambulating    Baseline  3/5, 04/28/17- 3/5, 05/10/17- 3+/5 bilaterally    Time  4    Period  Weeks    Status  Achieved      PT SHORT TERM GOAL #3   Title  Pt will report a 25% improvement in left knee pain to allow for improved mobility    Baseline  04/28/17- 50% improvement    Time  4    Period  Weeks    Status  Achieved        PT Long Term Goals - 05/19/17 4098      PT LONG TERM GOAL #1   Title  Pt will be independent in a home exercise program for continued strengthening and stretching    Time  8    Period  Weeks    Status  On-going      PT LONG TERM GOAL #2   Title  Pt will demonstrate 4/5 knee flexor strength to decrease risk of falls    Baseline  3/5, 04/28/17- 3/5, 05/10/17- R 4/5, L 3+/5, 05/19/17- R 4/5, L 4/5    Time  8    Period  Weeks    Status  Achieved      PT LONG TERM GOAL #3   Title  Pt will report 75% improvement in left knee pain to allow pt to complete her job using proper body mechanincs    Baseline  04/28/17- 50% improvement, 05/10/17- 50-60% improvement, 05/19/17- 75% improvement    Time  8    Period  Weeks    Status  Achieved      PT LONG TERM GOAL #4   Title  Pt will demonstrate 4/5 bilateral ankle dorsiflexor  strength to decrease risk of falls    Baseline  3/5, 04/28/17- R 4/5, L 3/5, 05/10/17- 3/5, 05/19/17- R 4+/5, L 4/5    Time  8    Period  Weeks    Status  Achieved      PT LONG TERM GOAL #5   Title  Pt will be able to complete 13 sit to stands in 30 seconds to decrease risk of falls    Baseline  7, 04/28/17- 9, 05/10/17- 10, 05/19/17- 11    Time  8    Period  Weeks    Status  On-going            Plan - 05/20/17 1626    Clinical Impression Statement  Patient able to perform a circular contraction after therapy and her strength went from 3/5 to 4/5 in one session.  Patient had increased softness of the pelvic floor muscles after therapy.  Patient was able to contract and pull up her pelvic floor with laughing.  Patient reports her urinary leakage is 25% better.  Patient will benefit from skilled therapy to improve strength and tissue mobility.     Rehab Potential  Excellent    Clinical Impairments Affecting Rehab Potential  pt with increased left knee and right shoulder pain    PT Frequency  2x / week    PT Duration  8 weeks    PT Treatment/Interventions  ADLs/Self Care Home Management;Electrical Stimulation;Neuromuscular re-education;Therapeutic exercise;Balance training;Manual techniques;Patient/family education;Therapeutic activities    PT Next Visit Plan  soft tissue work externally and internally, pelvic floor strength    PT Home Exercise Plan  progress as needed    Consulted and Agree with Plan of Care  Patient       Patient will benefit from skilled therapeutic intervention in order to improve the following deficits and impairments:  Decreased balance, Decreased endurance, Decreased mobility, Difficulty walking, Impaired sensation, Decreased strength, Pain, Postural dysfunction, Increased edema, Increased muscle spasms, Increased fascial restricitons  Visit Diagnosis: Muscle weakness (generalized)  Unspecified lack of coordination  Other muscle spasm     Problem  List Patient Active Problem List   Diagnosis Date Noted  . Physical deconditioning 03/25/2017  . Obesity (BMI 30.0-34.9) 03/25/2017  . Drug-induced hyperglycemia 12/23/2016  . Port catheter in place 12/02/2016  . Dysuria 12/02/2016  . 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 Acute rhinitis 11/04/2016  . Goals of care, counseling/discussion 11/04/2016  . 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 Endometrial cancer ([place]) 08/24/2016    Earlie Counts, PT 05/20/17 5:00 PM   [facility] [address] [place], [place], [address] Phone: [phone]   Fax:  [phone]  Name: Danielle Guzman MRN: [id number] Date of Birth: 09-28-1965

## 2017-05-24 ENCOUNTER — Encounter: Payer: Self-pay | Admitting: Physical Therapy

## 2017-05-24 ENCOUNTER — Ambulatory Visit: Payer: BLUE CROSS/BLUE SHIELD | Admitting: Physical Therapy

## 2017-05-24 DIAGNOSIS — R279 Unspecified lack of coordination: Secondary | ICD-10-CM

## 2017-05-24 DIAGNOSIS — M6281 Muscle weakness (generalized): Secondary | ICD-10-CM | POA: Diagnosis not present

## 2017-05-24 DIAGNOSIS — M62838 Other muscle spasm: Secondary | ICD-10-CM

## 2017-05-24 NOTE — Patient Instructions (Addendum)
Adduction: Hip - Knees Together With Pelvic Floor (Hook-Lying)    Lie with hips and knees bent, towel roll between knees. Squeeze pelvic floor while pushing knees together. Hold for _5__ seconds. Rest for _5__ seconds. Repeat _10__ times. Do __1_ times a day.   Copyright  VHI. All rights reserved.   External Rotation: Hip - Knees Apart With Pelvic Floor (Hook-Lying)    Lie with hips and knees bent, band tied just above knees. Squeeze pelvic floor while pulling knees apart. Hold for __5_ seconds. Rest for __5_ seconds. Repeat _10__ times. Do _1__ times a day.   Copyright  VHI. All rights reserved.  [facility] [address] [place], [place] [address] Phone # [phone] Fax [phone] Laugh: Phase 4 (Sitting)    Squeeze pelvic floor and hold. Inhale. Lean shoulders forward. Laugh repeatedly. Relax. Repeat _10__ times. Do __1_ times a day.  Copyright  VHI. All rights reserved.

## 2017-05-24 NOTE — Therapy (Signed)
[facility] [address], [place], [place], [address] Phone: [phone]   Fax:  [phone]  Physical Therapy Treatment  Patient Details  Name: Danielle Guzman MRN: [id number] Date of Birth: Sep 08, 1965 Referring Provider: Dr. Alvy Bimler   Encounter Date: 05/24/2017  PT End of Session - 05/24/17 1057    Visit Number  30    Number of Visits  30    Date for PT Re-Evaluation  07/19/17    Authorization Type  BCBS/ coinsurance 30 visit limit combine with PT/OT/Chiropractor    Authorization Time Period  speaking with insurance company    Authorization - Visit Number  30    Authorization - Number of Visits  30    PT Start Time  5573    PT Stop Time  1100    PT Time Calculation (min)  45 min    Activity Tolerance  Patient tolerated treatment well    Behavior During Therapy  [facility] for tasks assessed/performed       Past Medical History:  Diagnosis Date  . Cancer ([place]) 2018   endometrial   . Headache    hx migraines yrs ago  . Malaria as child  . Miscarriage    x 2  . Pneumonia yrs ago  . Seizures ([place])    petite mal seizure in high school x 1, none since    Past Surgical History:  Procedure Laterality Date  . DENTAL SURGERY     gum graft 20 yrs ago and again on 08/26/16  . DILATATION & CURETTAGE/HYSTEROSCOPY WITH MYOSURE N/A 08/11/2016   Procedure: DILATATION & CURETTAGE/HYSTEROSCOPY WITH MYOSURE;  Surgeon: Jerelyn Charles, MD;  Location: [facility];  Service: Gynecology;  Laterality: N/A;  polyp removal  . IR FLUORO GUIDE PORT INSERTION RIGHT  11/10/2016  . IR REMOVAL TUN ACCESS W/ PORT W/O FL MOD SED  03/11/2017  . IR US GUIDE VASC ACCESS RIGHT  11/10/2016  . LYMPH NODE BIOPSY N/A 09/01/2016   Procedure: SENTINEL LYMPH NODE BIOPSY;  Surgeon: Everitt Amber, MD;  Location: [facility];  Service: Gynecology;  Laterality: N/A;  . ROBOTIC ASSISTED TOTAL HYSTERECTOMY WITH BILATERAL SALPINGO OOPHERECTOMY Bilateral 09/01/2016   Procedure:  ROBOTIC ASSISTED TOTAL HYSTERECTOMY WITH BILATERAL SALPINGO OOPHORECTOMY;  Surgeon: Everitt Amber, MD;  Location: [facility];  Service: Gynecology;  Laterality: Bilateral;  . WISDOM TOOTH EXTRACTION      There were no vitals filed for this visit.  Subjective Assessment - 05/24/17 1017    Subjective  I can hold a pelvic floor contraction when I have urgency.  I have less leakage, 35% better.  I am able to contract around the [place] egg now. I leak when I get up and down, laughing. Patient has shooting pains 1 time per day.  Patient is at more of a 1/10 pain.     Pertinent History  ovarian cancer with complete hysterectomy 09/01/16 and SLNB, completed chemotherapy on 02/23/17    Patient Stated Goals  to improve LE strength    Currently in Pain?  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 Alvy Bimler    Onset Date/Surgical Date  09/01/17    Prior Therapy  no pelvic floor therapy      Precautions   Precautions  Other (comment)    Precaution Comments  at risk for LE  lymphedema      Restrictions   Weight Bearing Restrictions  No      Balance Screen   Has the patient fallen in the past 6 months  No    Has the patient had a decrease in activity level because of a fear of falling?   No    Is the patient reluctant to leave their home because of a fear of falling?   No      Home Film/video editor residence    Living Arrangements  Spouse/significant other    Available Help at Discharge  Family    Type of [place] to enter    Entrance Stairs-Number of Steps  6      Cognition   Overall Cognitive Status  Within Functional Limits for tasks assessed      Posture/Postural Control   Posture/Postural Control  No significant limitations      PROM   Overall PROM   Within functional limits for tasks performed      Strength   Right Hip Flexion  4/5    Right Hip ABduction  3+/5    Right Hip ADduction  3/5    Left Hip Flexion  4/5    Left Hip ABduction  3+/5    Left Hip ADduction  3/5               Pelvic Floor Special Questions - 05/24/17 0001    Strength  good squeeze, good lift, able to hold agaisnt strong resistance        [facility] Adult PT Treatment/Exercise - 05/24/17 0001      Neuro Re-ed    Neuro Re-ed Details   pelvic floor contraction with laugher to reduce increase abdominal pressure      Lumbar Exercises: Seated   Other Seated Lumbar Exercises  contract pelvic floor with pulling the ischial tuberosities into tthe bed then pull prior to laugh    Other Seated Lumbar Exercises  seated with pressure on the pelvic floor muscles with trunk rotation, pelvic sway, arms up and down to massage the pelvic floor muscles      Lumbar Exercises: Supine   Clam  10 reps;5 seconds with pelvic contraction and yellow band    Other  Supine Lumbar Exercises  hip adduction hold 5 sec 10 times, pelvic floor contraction      Knee/Hip Exercises: Seated   Sit to Sand  10 reps;with UE support while  therapist paces pressure on the coccygeus       Knee/Hip Exercises: Sidelying   Hip ABduction  Strengthening;Right;Left;1 set;15 reps contracting pelvic floor     Hip ADduction  Strengthening;Right;Left;1 set;15 reps with pelvic floor contraction             PT Education - 05/24/17 1052    Education provided  Yes    Education Details  pelvic floor contraction with hip adduction and ER, laugh with pelvic  floor contraction    Person(s) Educated  Patient    Methods  Explanation;Demonstration;Verbal cues;Handout    Comprehension  Verbalized understanding;Returned demonstration       PT Short Term Goals - 05/24/17 1054      PT SHORT TERM GOAL #5   Title  ability to have penile penetration with her husband with pain decreased >/= 20% better     Time  8    Period  Weeks    Status  Achieved        PT Long Term Goals - 05/24/17 1055      PT LONG TERM GOAL #1   Title  Pt will be independent in a home exercise program for continued strengthening and stretching    Baseline  still learning    Time  8    Period  Weeks    Status  On-going      PT LONG TERM GOAL #2   Title  Pt will demonstrate 4/5 knee flexor strength to decrease risk of falls    Baseline  3/5, 04/28/17- 3/5, 05/10/17- R 4/5, L 3+/5, 05/19/17- R 4/5, L 4/5    Time  8    Period  Weeks    Status  Achieved      PT LONG TERM GOAL #3   Title  Pt will report 75% improvement in left knee pain to allow pt to complete her job using proper body mechanincs    Baseline  04/28/17- 50% improvement, 05/10/17- 50-60% improvement, 05/19/17- 75% improvement    Time  8    Period  Weeks    Status  Achieved      PT LONG TERM GOAL #4   Title  Pt will demonstrate 4/5 bilateral ankle dorsiflexor strength to decrease risk of falls    Baseline  3/5, 04/28/17- R 4/5, L 3/5, 05/10/17- 3/5, 05/19/17- R 4+/5, L 4/5    Time  8    Period  Weeks    Status  Achieved      PT LONG TERM GOAL #5   Title  Pt will be able to complete 13 sit to stands  in 30 seconds to decrease risk of falls    Baseline  7, 04/28/17- 9, 05/10/17- 10, 05/19/17- 11    Time  8    Period  Weeks    Status  On-going      PT LONG TERM GOAL #6   Title  urinary leakage decreased >/= 75% so she is using 1 light pad per day due to increased pelvic floor strength    Baseline  30% better    Time  8    Period  Weeks    Status  On-going      PT LONG TERM GOAL #7   Title  able to have penile penetration with >/=  50% decreased in pain    Baseline  20% better    Time  8    Period  Weeks    Status  On-going      PT LONG TERM GOAL #8   Title  Deep ache in the pelvis has decreased >/= 75% due to improved tissue mobility    Baseline  15% better    Time  8    Period  Weeks    Status  On-going            Plan - 05/24/17 1058    Clinical Impression Statement  Patient deep ach in the pelvic is 20% better.  Her constant pelvic pain is more than 1/10 and only 1 sharp 5/10 compared to 8/10 and 1 time per day.. Pelvic floor strenght is 3-4/5 with slight ache in right gluteal. Patient will leak urine with sit to stand, laughing and sometimes with the urge to urinate.  Patient continues to have trigger points in the pelvic floor muscles and hip adductors.  Hip strength for adduction is 3/5, hip abduction is 3+/5 and flexion is 4/5 bilaterally.  Patient will benefit from skilled therapy to improve pelvic floor strength and reduce muscle pain to reduce urinary leakage.     Clinical Impairments Affecting Rehab Potential  pt with increased left knee and right shoulder pain    PT Frequency  1x / week    PT Duration  8 weeks    PT Treatment/Interventions  ADLs/Self Care Home Management;Electrical Stimulation;Neuromuscular re-education;Therapeutic exercise;Balance training;Manual techniques;Patient/family education;Therapeutic activities;Dry needling    PT Next Visit Plan  soft tissue work externally and internally, pelvic floor strength    PT Home Exercise Plan  progress as needed     Consulted and Agree with Plan of Care  Patient       Patient will benefit from skilled therapeutic intervention in order to improve the following deficits and impairments:  Decreased balance, Decreased endurance, Decreased mobility, Difficulty walking, Impaired sensation, Decreased strength, Pain, Postural dysfunction, Increased edema, Increased muscle spasms, Increased fascial restricitons  Visit Diagnosis: Muscle weakness (generalized) - Plan: PT plan of care cert/re-cert  Unspecified lack of coordination - Plan: PT plan of care cert/re-cert  Other muscle spasm - Plan: PT plan of care cert/re-cert     Problem List Patient Active Problem List   Diagnosis Date Noted  . Physical deconditioning 03/25/2017  . Obesity (BMI 30.0-34.9) 03/25/2017  . Drug-induced hyperglycemia 12/23/2016  . Port catheter in place 12/02/2016  . Dysuria 12/02/2016  . Peripheral neuropathy due to chemotherapy ([place]) 12/02/2016  . Acute rhinitis 11/04/2016  . Goals of care, counseling/discussion 11/04/2016  . 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 Endometrial cancer ([place]) 08/24/2016    Earlie Counts, PT 05/24/17 11:29 AM   [facility] [address], [place], [place], [address] Phone: [phone]   Fax:  [phone]  Name: Danielle Guzman MRN: [id number] Date of Birth: 25-Oct-1965

## 2017-05-25 ENCOUNTER — Ambulatory Visit: Payer: BLUE CROSS/BLUE SHIELD

## 2017-05-25 DIAGNOSIS — G8929 Other chronic pain: Secondary | ICD-10-CM

## 2017-05-25 DIAGNOSIS — M25562 Pain in left knee: Secondary | ICD-10-CM

## 2017-05-25 DIAGNOSIS — M6281 Muscle weakness (generalized): Secondary | ICD-10-CM

## 2017-05-25 DIAGNOSIS — R262 Difficulty in walking, not elsewhere classified: Secondary | ICD-10-CM

## 2017-05-25 NOTE — Therapy (Signed)
[place], [place], [address] Phone: [phone]   Fax:  [phone]  Physical Therapy Treatment  Patient Details  Name: Danielle Guzman MRN: [id number] Date of Birth: 27-Sep-1965 Referring Provider: Dr. Alvy Bimler   Encounter Date: 05/25/2017  PT End of Session - 05/25/17 0932    Visit Number  31    Number of Visits  38    Date for PT Re-Evaluation  06/22/17 [facility] rehab    Authorization Type  BCBS/ coinsurance 30 visit limit combine with PT/OT/Chiropractor    Authorization Time Period  speaking with insurance company but pt willing to pay out of pocket to cont    Authorization - Visit Number  30    Authorization - Number of Visits  30    PT Start Time  0845    PT Stop Time  0931    PT Time Calculation (min)  46 min    Activity Tolerance  Patient tolerated treatment well    Behavior During Therapy  [facility] for tasks assessed/performed       Past Medical History:  Diagnosis Date  . Cancer ([place]) 2018   endometrial   . Headache    hx migraines yrs ago  . Malaria as child  . Miscarriage    x 2  . Pneumonia yrs ago  . Seizures ([place])    petite mal seizure in high school x 1, none since    Past Surgical History:  Procedure Laterality Date  . DENTAL SURGERY     gum graft 20 yrs ago and again on 08/26/16  . DILATATION & CURETTAGE/HYSTEROSCOPY WITH MYOSURE N/A 08/11/2016   Procedure: DILATATION & CURETTAGE/HYSTEROSCOPY WITH MYOSURE;  Surgeon: Jerelyn Charles, MD;  Location: [facility];  Service: Gynecology;  Laterality: N/A;  polyp removal  . IR FLUORO GUIDE PORT INSERTION RIGHT  11/10/2016  . IR REMOVAL TUN ACCESS W/ PORT W/O FL MOD SED  03/11/2017  . IR US GUIDE VASC ACCESS RIGHT  11/10/2016  . LYMPH NODE BIOPSY N/A 09/01/2016   Procedure: SENTINEL LYMPH NODE BIOPSY;  Surgeon: Everitt Amber, MD;  Location: [facility];  Service: Gynecology;  Laterality: N/A;  . ROBOTIC ASSISTED TOTAL HYSTERECTOMY WITH BILATERAL SALPINGO  OOPHERECTOMY Bilateral 09/01/2016   Procedure: ROBOTIC ASSISTED TOTAL HYSTERECTOMY WITH BILATERAL SALPINGO OOPHORECTOMY;  Surgeon: Everitt Amber, MD;  Location: [facility];  Service: Gynecology;  Laterality: Bilateral;  . WISDOM TOOTH EXTRACTION      There were no vitals filed for this visit.  Subjective Assessment - 05/25/17 0857    Subjective  [employer] called me and left a message about [employer] so I'm planning on calling them back today to see when that starts. Overall I'm doing well and Lt knee is slowly doing better. I had an acupuncture treatment yesterday to my Lt knee that seemed to help, it already feels better today. Both have actually been sore lately though I think from increasing working out.    Pertinent History  ovarian cancer with complete hysterectomy 09/01/16 and SLNB, completed chemotherapy on 02/23/17    Patient Stated Goals  to improve LE strength    Currently in Pain?  Yes    Pain Score  3     Pain Location  Knee    Pain Orientation  Right;Left    Pain Descriptors / Indicators  Aching    Pain Type  Chronic pain    Pain Onset  More than a month ago    Aggravating Factors  getting up/down; kneeling    Pain Relieving Factors  heat, rest                      [facility] Adult PT Treatment/Exercise - 05/25/17 0001      Neuro Re-ed    Neuro Re-ed Details   Standing on Airex with 2 lb ankle weights: Marching 2x10 each; mini squats with VCs for equal weight bearing; bil SLS 15 sec each; 3 way bil hip raises 10 times each with VCs for pelvic stability and erect posture throughout      Knee/Hip Exercises: Aerobic   Recumbent Bike  Level 3, 12 mins RPE- 7      Knee/Hip Exercises: Standing   Forward Step Up  Right;Left;10 reps;Hand Hold: 0;Step Height: 4" Airex on step with 3 sec holds each time    Other Standing Knee Exercises  Sit to stand 10x in 42 sec, knee pain increasing to 4-5/10.             PT Education - 05/24/17 1052    Education provided   Yes    Education Details  pelvic floor contraction with hip adduction and ER, laugh with pelvic  floor contraction    Person(s) Educated  Patient    Methods  Explanation;Demonstration;Verbal cues;Handout    Comprehension  Verbalized understanding;Returned demonstration       PT Short Term Goals - 05/25/17 1149      PT SHORT TERM GOAL #1   Title  Pt will be able to complete 9 sit to stands in 30 sec to decrease risk of falls    Baseline  7, 04/28/17- 9    Status  Achieved      PT SHORT TERM GOAL #2   Title  Pt will demonstrate 3+/5 hip flexor strength to decrease risk of falls when ambulating    Baseline  3/5, 04/28/17- 3/5, 05/10/17- 3+/5 bilaterally    Status  Achieved      PT SHORT TERM GOAL #3   Title  Pt will report a 25% improvement in left knee pain to allow for improved mobility    Baseline  04/28/17- 50% improvement    Status  Achieved        PT Long Term Goals - 05/25/17 1150      PT LONG TERM GOAL #1   Title  Pt will be independent in a home exercise program for continued strengthening and stretching    Status  On-going      PT LONG TERM GOAL #2   Title  Pt will demonstrate 4/5 knee flexor strength to decrease risk of falls    Baseline  3/5, 04/28/17- 3/5, 05/10/17- R 4/5, L 3+/5, 05/19/17- R 4/5, L 4/5    Status  Achieved      PT LONG TERM GOAL #3   Title  Pt will report 75% improvement in left knee pain to allow pt to complete her job using proper body mechanincs    Baseline  04/28/17- 50% improvement, 05/10/17- 50-60% improvement, 05/19/17- 75% improvement    Status  Achieved      PT LONG TERM GOAL #4   Title  Pt will demonstrate 4/5 bilateral ankle dorsiflexor strength to decrease risk of falls    Baseline  3/5, 04/28/17- R 4/5, L 3/5, 05/10/17- 3/5, 05/19/17- R 4+/5, L 4/5    Status  Achieved      PT LONG TERM GOAL #5   Title  Pt will be able to  complete 13 sit to stands in 30 seconds to decrease risk of falls    Baseline  7, 04/28/17- 9, 05/10/17- 10, 05/19/17- 11; 10  in 42 sec today (pt reported having some increased knee pain with this today) - 05/25/17    Status  On-going            Plan - 05/25/17 0934    Clinical Impression Statement  Pt continues to show great improvement with functional strength. She is working to F/U to see an orthopedist (plans on Dr. Wynelle Link) for chronic Lt knee pain and also plans to return call to [facility] regarding [employer]. Pt would benefit from continuing therapy until it she begins [employer] to allow for smooth transition and pt not to loose any ground in her recovery with stength and endurance.     Rehab Potential  Excellent    Clinical Impairments Affecting Rehab Potential  pt with increased left knee and right shoulder pain    PT Frequency  2x / week    PT Duration  4 weeks    PT Treatment/Interventions  ADLs/Self Care Home Management;Electrical Stimulation;Neuromuscular re-education;Therapeutic exercise;Balance training;Manual techniques;Patient/family education;Therapeutic activities    PT Next Visit Plan  Renewal done today for CA rehab. Cont LE strength, sit to stands, and balance    Consulted and Agree with Plan of Care  Patient       Patient will benefit from skilled therapeutic intervention in order to improve the following deficits and impairments:  Decreased balance, Decreased endurance, Decreased mobility, Difficulty walking, Impaired sensation, Decreased strength, Pain, Postural dysfunction, Increased edema, Increased muscle spasms, Increased fascial restricitons  Visit Diagnosis: Muscle weakness (generalized)  Difficulty in walking, not elsewhere classified  Chronic pain of left knee     Problem List Patient Active Problem List   Diagnosis Date Noted  . Physical deconditioning 03/25/2017  . Obesity (BMI 30.0-34.9) 03/25/2017  . Drug-induced hyperglycemia 12/23/2016  . Port catheter in place 12/02/2016  . Dysuria 12/02/2016  . Peripheral neuropathy due to chemotherapy ([place])  12/02/2016  . Acute rhinitis 11/04/2016  . Goals of care, counseling/discussion 11/04/2016  . 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 Endometrial cancer ([place]) 08/24/2016    Otelia Limes, PTA 05/25/2017, 11:56 AM  [place], [place], [address] Phone: [phone]   Fax:  [phone]  Name: DAMAYA CHANNING MRN: [id number] Date of Birth: [birth year]

## 2017-05-26 ENCOUNTER — Telehealth: Payer: Self-pay | Admitting: *Deleted

## 2017-05-26 NOTE — Telephone Encounter (Signed)
Pt is requesting a referral to Dr Elmyra Ricks orthopedics.

## 2017-05-27 NOTE — Telephone Encounter (Signed)
I think she needs PCP to refer

## 2017-05-31 ENCOUNTER — Ambulatory Visit: Payer: BLUE CROSS/BLUE SHIELD | Admitting: Physical Therapy

## 2017-05-31 ENCOUNTER — Encounter: Payer: Self-pay | Admitting: Physical Therapy

## 2017-05-31 DIAGNOSIS — G8929 Other chronic pain: Secondary | ICD-10-CM

## 2017-05-31 DIAGNOSIS — M25562 Pain in left knee: Secondary | ICD-10-CM

## 2017-05-31 DIAGNOSIS — M6281 Muscle weakness (generalized): Secondary | ICD-10-CM | POA: Diagnosis not present

## 2017-05-31 DIAGNOSIS — R262 Difficulty in walking, not elsewhere classified: Secondary | ICD-10-CM

## 2017-05-31 NOTE — Therapy (Signed)
[place], [place], [address] Phone: [phone]   Fax:  [phone]  Physical Therapy Treatment  Patient Details  Name: Danielle Guzman MRN: [id number] Date of Birth: 1965/09/12 Referring Provider: Dr. Alvy Bimler   Encounter Date: 05/31/2017  PT End of Session - 05/31/17 1222    Visit Number  32    Number of Visits  54    Date for PT Re-Evaluation  07/19/17    Authorization Type  BCBS/ coinsurance 30 visit limit combine with PT/OT/Chiropractor    Authorization Time Period  speaking with insurance company but pt willing to pay out of pocket to cont    Authorization - Visit Number  31    Authorization - Number of Visits  30    PT Start Time  [phone]    PT Stop Time  0933    PT Time Calculation (min)  35 min    Activity Tolerance  Patient tolerated treatment well    Behavior During Therapy  [facility] for tasks assessed/performed       Past Medical History:  Diagnosis Date  . Cancer ([place]) 2018   endometrial   . Headache    hx migraines yrs ago  . Malaria as child  . Miscarriage    x 2  . Pneumonia yrs ago  . 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 DENTAL SURGERY     gum graft 20 yrs ago and again on 08/26/16  . DILATATION & CURETTAGE/HYSTEROSCOPY WITH MYOSURE N/A 08/11/2016   Procedure: DILATATION & CURETTAGE/HYSTEROSCOPY WITH MYOSURE;  Surgeon: Jerelyn Charles, MD;  Location: [facility];  Service: Gynecology;  Laterality: N/A;  polyp removal  . IR FLUORO GUIDE PORT INSERTION RIGHT  11/10/2016  . IR REMOVAL TUN ACCESS W/ PORT W/O FL MOD SED  03/11/2017  . IR US GUIDE VASC ACCESS RIGHT  11/10/2016  . LYMPH NODE BIOPSY N/A 09/01/2016   Procedure: SENTINEL LYMPH NODE BIOPSY;  Surgeon: Everitt Amber, MD;  Location: [facility];  Service: Gynecology;  Laterality: N/A;  . ROBOTIC ASSISTED TOTAL HYSTERECTOMY WITH BILATERAL SALPINGO OOPHERECTOMY  Bilateral 09/01/2016   Procedure: ROBOTIC ASSISTED TOTAL HYSTERECTOMY WITH BILATERAL SALPINGO OOPHORECTOMY;  Surgeon: Everitt Amber, MD;  Location: [facility];  Service: Gynecology;  Laterality: Bilateral;  . WISDOM TOOTH EXTRACTION      There were no vitals filed for this visit.  Subjective Assessment - 05/31/17 0859    Subjective  The nurse from Dr. Alvy Bimler office is supposed to call me about getting in to the orthopedic doctor. I am on the email list for a late summer start for [employer].     Pertinent History  ovarian cancer with complete hysterectomy 09/01/16 and SLNB, completed chemotherapy on 02/23/17    Patient Stated Goals  to improve LE strength    Currently in Pain?  Yes    Pain Score  2     Pain Location  Knee    Pain Orientation  Right;Left    Pain Descriptors / Indicators  Aching    Pain Type  Chronic pain                      [facility] Adult PT Treatment/Exercise - 05/31/17 0001      Neuro Re-ed    Neuro Re-ed Details   Standing on Airex with 2 lb ankle weights: Marching 2x10  each; mini squats with VCs for equal weight bearing; bil SLS 30 sec each; 3 way bil hip raises 10 times each with VCs for pelvic stability and erect posture throughout      Knee/Hip Exercises: Aerobic   Recumbent Bike  Level 3, 12 mins RPE- 7               PT Short Term Goals - 05/25/17 1149      PT SHORT TERM GOAL #1   Title  Pt will be able to complete 9 sit to stands in 30 sec to decrease risk of falls    Baseline  7, 04/28/17- 9    Status  Achieved      PT SHORT TERM GOAL #2   Title  Pt will demonstrate 3+/5 hip flexor strength to decrease risk of falls when ambulating    Baseline  3/5, 04/28/17- 3/5, 05/10/17- 3+/5 bilaterally    Status  Achieved      PT SHORT TERM GOAL #3   Title  Pt will report a 25% improvement in left knee pain to allow for improved mobility    Baseline  04/28/17- 50% improvement    Status  Achieved        PT Long Term Goals - 05/31/17 1225       PT LONG TERM GOAL #1   Title  Pt will be independent in a home exercise program for continued strengthening and stretching    Period  Weeks    Status  Achieved      PT LONG TERM GOAL #2   Title  Pt will demonstrate 4/5 knee flexor strength to decrease risk of falls    Baseline  3/5, 04/28/17- 3/5, 05/10/17- R 4/5, L 3+/5, 05/19/17- R 4/5, L 4/5    Time  8    Period  Weeks    Status  Achieved      PT LONG TERM GOAL #3   Title  Pt will report 75% improvement in left knee pain to allow pt to complete her job using proper body mechanincs    Baseline  04/28/17- 50% improvement, 05/10/17- 50-60% improvement, 05/19/17- 75% improvement    Time  8    Period  Weeks    Status  Achieved      PT LONG TERM GOAL #4   Title  Pt will demonstrate 4/5 bilateral ankle dorsiflexor strength to decrease risk of falls    Baseline  3/5, 04/28/17- R 4/5, L 3/5, 05/10/17- 3/5, 05/19/17- R 4+/5, L 4/5    Time  8    Period  Weeks    Status  Achieved      PT LONG TERM GOAL #5   Title  Pt will be able to complete 13 sit to stands in 30 seconds to decrease risk of falls    Baseline  7, 04/28/17- 9, 05/10/17- 10, 05/19/17- 11; 10 in 42 sec today (pt reported having some increased knee pain with this today) - 05/25/17    Time  8    Period  Weeks    Status  Partially Met            Plan - 05/31/17 1223    Clinical Impression Statement  Pt is now ready for discharge from skilled PT services. She has met her goals for therapy. She did not quite meet her sit to stand goal because of her knee. She is following up with an orthopedic doctor for her knee pain though it has improved  since beginning therapy. Pt will begin [employer] this summer and is on the mailing list. She will be discharged from skilled PT services at this time.     Rehab Potential  Excellent    Clinical Impairments Affecting Rehab Potential  pt with increased left knee and right shoulder pain    PT Frequency  2x / week    PT Duration  4 weeks    PT  Treatment/Interventions  ADLs/Self Care Home Management;Electrical Stimulation;Neuromuscular re-education;Therapeutic exercise;Balance training;Manual techniques;Patient/family education;Therapeutic activities    PT Next Visit Plan  dc today    PT Home Exercise Plan  progress as needed    Consulted and Agree with Plan of Care  Patient       Patient will benefit from skilled therapeutic intervention in order to improve the following deficits and impairments:  Decreased balance, Decreased endurance, Decreased mobility, Difficulty walking, Impaired sensation, Decreased strength, Pain, Postural dysfunction, Increased edema, Increased muscle spasms, Increased fascial restricitons  Visit Diagnosis: Muscle weakness (generalized)  Difficulty in walking, not elsewhere classified  Chronic pain of left knee     Problem List Patient Active Problem List   Diagnosis Date Noted  . Physical deconditioning 03/25/2017  . Obesity (BMI 30.0-34.9) 03/25/2017  . Drug-induced hyperglycemia 12/23/2016  . Port catheter in place 12/02/2016  . Dysuria 12/02/2016  . 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 Acute rhinitis 11/04/2016  . Goals of care, counseling/discussion 11/04/2016  . 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 Endometrial cancer ([place]) 08/24/2016    Allyson Sabal [facility] 05/31/2017, 12:26 PM  [place], [place], [address] Phone: [phone]   Fax:  [phone]  Name: CHIQUETTA LANGNER MRN: [id number] Date of Birth: Mar 23, 1965  PHYSICAL THERAPY DISCHARGE SUMMARY  Visits from Start of Care: 32  Current functional level related to goals / functional outcomes: See above   Remaining deficits: See above   Education / Equipment: HEP Plan: Patient agrees to discharge.  Patient goals were met. Patient is being discharged due to meeting the stated rehab goals.   ?????    Allyson Sabal [place], [place] 05/31/17 12:27 PM

## 2017-06-01 ENCOUNTER — Ambulatory Visit: Payer: BLUE CROSS/BLUE SHIELD | Admitting: Physical Therapy

## 2017-06-01 ENCOUNTER — Encounter: Payer: Self-pay | Admitting: Physical Therapy

## 2017-06-01 DIAGNOSIS — R279 Unspecified lack of coordination: Secondary | ICD-10-CM

## 2017-06-01 DIAGNOSIS — M62838 Other muscle spasm: Secondary | ICD-10-CM

## 2017-06-01 DIAGNOSIS — M6281 Muscle weakness (generalized): Secondary | ICD-10-CM | POA: Diagnosis not present

## 2017-06-01 NOTE — Therapy (Signed)
[facility] [address], [place], [place], [address] Phone: [phone]   Fax:  [phone]  Physical Therapy Treatment  Patient Details  Name: Danielle Guzman MRN: [id number] Date of Birth: [birth year] Referring Provider: Dr. Alvy Bimler   Encounter Date: 06/01/2017  PT End of Session - 06/01/17 1448    Visit Number  33    Number of Visits  54    Date for PT Re-Evaluation  07/19/17    Authorization Type  BCBS/ coinsurance 30 visit limit combine with PT/OT/Chiropractor    Authorization Time Period  speaking with insurance company but pt willing to pay out of pocket to cont    Authorization - Visit Number  32    Authorization - Number of Visits  30    PT Start Time  1449    PT Stop Time  1530    PT Time Calculation (min)  41 min    Activity Tolerance  Patient tolerated treatment well    Behavior During Therapy  [facility] for tasks assessed/performed       Past Medical History:  Diagnosis Date  . Cancer ([place]) 2018   endometrial   . Headache    hx migraines yrs ago  . Malaria as child  . Miscarriage    x 2  . Pneumonia yrs ago  . Seizures ([place])    petite mal seizure in high school x 1, none since    Past Surgical History:  Procedure Laterality Date  . DENTAL SURGERY     gum graft 20 yrs ago and again on 08/26/16  . DILATATION & CURETTAGE/HYSTEROSCOPY WITH MYOSURE N/A 08/11/2016   Procedure: DILATATION & CURETTAGE/HYSTEROSCOPY WITH MYOSURE;  Surgeon: Jerelyn Charles, MD;  Location: [facility];  Service: Gynecology;  Laterality: N/A;  polyp removal  . IR FLUORO GUIDE PORT INSERTION RIGHT  11/10/2016  . IR REMOVAL TUN ACCESS W/ PORT W/O FL MOD SED  03/11/2017  . IR US GUIDE VASC ACCESS RIGHT  11/10/2016  . LYMPH NODE BIOPSY N/A 09/01/2016   Procedure: SENTINEL LYMPH NODE BIOPSY;  Surgeon: Everitt Amber, MD;  Location: [facility];  Service: Gynecology;  Laterality: N/A;  . ROBOTIC ASSISTED TOTAL HYSTERECTOMY WITH BILATERAL SALPINGO  OOPHERECTOMY Bilateral 09/01/2016   Procedure: ROBOTIC ASSISTED TOTAL HYSTERECTOMY WITH BILATERAL SALPINGO OOPHORECTOMY;  Surgeon: Everitt Amber, MD;  Location: [facility];  Service: Gynecology;  Laterality: Bilateral;  . WISDOM TOOTH EXTRACTION      There were no vitals filed for this visit.  Subjective Assessment - 06/01/17 1451    Subjective  The urinary leakage is better by 50% better.  When I get up from the commode some urine will come out but is better than it used to. I pull up the pelvic floor when I laugh and blow nose. Intercourse is 50% better. Patient is not holding on in fear of the pain.      Pertinent History  ovarian cancer with complete hysterectomy 09/01/16 and SLNB, completed chemotherapy on 02/23/17    Patient Stated Goals  to improve LE strength    Currently in Pain?  Yes    Pain Score  2     Pain Location  Abdomen    Pain Orientation  Lower    Pain Descriptors / Indicators  Aching    Pain Type  Chronic pain    Pain Onset  More than a month ago    Pain Frequency  Constant    Aggravating Factors   not sure  what aggarvates pain,     Pain Relieving Factors  heat, rest    Multiple Pain Sites  No                   Pelvic Floor Special Questions - 06/01/17 0001    Pelvic Floor Internal Exam  Patient confirms indentification and approves PT to assess pelvic floor integrity and treatment    Exam Type  Vaginal    Strength  good squeeze, good lift, able to hold agaisnt strong resistance        [facility] Adult PT Treatment/Exercise - 06/01/17 0001      Manual Therapy   Manual Therapy  Soft tissue mobilization;Myofascial release    Soft tissue mobilization  perineal body, ischiocavernosus, bulbocavernosus    Myofascial Release  introitus bil. sides    Internal Pelvic Floor  along the introitus, sphincter       Trigger Point Dry Needling - 06/01/17 1525    Consent Given?  Yes    Education Handout Provided  Yes    Muscles Treated Upper Body  -- bil.  ishiocavernosus, bulbocavernosus, perineal body    Muscles Treated Lower Body  -- twitch and elongation of muscle           PT Education - 06/01/17 1528    Education provided  Yes    Education Details  information on dry needling    Person(s) Educated  Patient    Methods  Explanation;Handout    Comprehension  Verbalized understanding       PT Short Term Goals - 05/25/17 1149      PT SHORT TERM GOAL #1   Title  Pt will be able to complete 9 sit to stands in 30 sec to decrease risk of falls    Baseline  7, 04/28/17- 9    Status  Achieved      PT SHORT TERM GOAL #2   Title  Pt will demonstrate 3+/5 hip flexor strength to decrease risk of falls when ambulating    Baseline  3/5, 04/28/17- 3/5, 05/10/17- 3+/5 bilaterally    Status  Achieved      PT SHORT TERM GOAL #3   Title  Pt will report a 25% improvement in left knee pain to allow for improved mobility    Baseline  04/28/17- 50% improvement    Status  Achieved        PT Long Term Goals - 05/31/17 1225      PT LONG TERM GOAL #1   Title  Pt will be independent in a home exercise program for continued strengthening and stretching    Period  Weeks    Status  Achieved      PT LONG TERM GOAL #2   Title  Pt will demonstrate 4/5 knee flexor strength to decrease risk of falls    Baseline  3/5, 04/28/17- 3/5, 05/10/17- R 4/5, L 3+/5, 05/19/17- R 4/5, L 4/5    Time  8    Period  Weeks    Status  Achieved      PT LONG TERM GOAL #3   Title  Pt will report 75% improvement in left knee pain to allow pt to complete her job using proper body mechanincs    Baseline  04/28/17- 50% improvement, 05/10/17- 50-60% improvement, 05/19/17- 75% improvement    Time  8    Period  Weeks    Status  Achieved      PT LONG TERM GOAL #4  Title  Pt will demonstrate 4/5 bilateral ankle dorsiflexor strength to decrease risk of falls    Baseline  3/5, 04/28/17- R 4/5, L 3/5, 05/10/17- 3/5, 05/19/17- R 4+/5, L 4/5    Time  8    Period  Weeks    Status   Achieved      PT LONG TERM GOAL #5   Title  Pt will be able to complete 13 sit to stands in 30 seconds to decrease risk of falls    Baseline  7, 04/28/17- 9, 05/10/17- 10, 05/19/17- 11; 10 in 42 sec today (pt reported having some increased knee pain with this today) - 05/25/17    Time  8    Period  Weeks    Status  Partially Met            Plan - 06/01/17 1448    Clinical Impression Statement  Patient reports urinary leakage is 50% better.  Pain with intercourse is 50% better especially the initial penetration.  Patient had increased elongation of the pelvic floor muscles after the dry needling. Patient was able to close the vaginal opening into a smaller diameter.  Patient will benefit from skilled therapy to improve tissue elongation and improve strength to reduce urinary leakage and pain.     Rehab Potential  Excellent    Clinical Impairments Affecting Rehab Potential  pt with increased left knee and right shoulder pain    PT Frequency  2x / week    PT Duration  4 weeks    PT Treatment/Interventions  ADLs/Self Care Home Management;Electrical Stimulation;Neuromuscular re-education;Therapeutic exercise;Balance training;Manual techniques;Patient/family education;Therapeutic activities;Dry needling    PT Next Visit Plan  work on the pelvic floor to improve tissue mobility and reduce urinary leakage; see if dry needling helped    PT Home Exercise Plan  progress as needed    Consulted and Agree with Plan of Care  Patient       Patient will benefit from skilled therapeutic intervention in order to improve the following deficits and impairments:  Decreased balance, Decreased endurance, Decreased mobility, Difficulty walking, Impaired sensation, Decreased strength, Pain, Postural dysfunction, Increased edema, Increased muscle spasms, Increased fascial restricitons  Visit Diagnosis: Other muscle spasm  Unspecified lack of coordination     Problem List Patient Active Problem List    Diagnosis Date Noted  . Physical deconditioning 03/25/2017  . Obesity (BMI 30.0-34.9) 03/25/2017  . Drug-induced hyperglycemia 12/23/2016  . Port catheter in place 12/02/2016  . Dysuria 12/02/2016  . 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 Acute rhinitis 11/04/2016  . Goals of care, counseling/discussion 11/04/2016  . 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 Endometrial cancer ([place]) 08/24/2016    Synthia Fairbank 06/01/2017, 3:32 PM  [facility] [address] [place], [place], [address] Phone: [phone]   Fax:  [phone]  Name: EVIE CROSTON MRN: [id number] Date of Birth: Aug 06, 1965

## 2017-06-01 NOTE — Patient Instructions (Signed)

## 2017-06-22 ENCOUNTER — Encounter: Payer: Self-pay | Admitting: Physical Therapy

## 2017-06-22 ENCOUNTER — Ambulatory Visit: Payer: BLUE CROSS/BLUE SHIELD | Attending: Hematology and Oncology | Admitting: Physical Therapy

## 2017-06-22 DIAGNOSIS — R279 Unspecified lack of coordination: Secondary | ICD-10-CM | POA: Diagnosis present

## 2017-06-22 DIAGNOSIS — M6281 Muscle weakness (generalized): Secondary | ICD-10-CM | POA: Diagnosis present

## 2017-06-22 DIAGNOSIS — M62838 Other muscle spasm: Secondary | ICD-10-CM | POA: Insufficient documentation

## 2017-06-22 NOTE — Therapy (Signed)
[facility] [address] [place], [place], [address] Phone: [phone]   Fax:  [phone]  Physical Therapy Treatment  Patient Details  Name: Danielle Guzman MRN: [id number] Date of Birth: February 20, 1966 Referring Provider: Dr. Alvy Bimler   Encounter Date: 06/22/2017  PT End of Session - 06/22/17 0816    Visit Number  34    Date for PT Re-Evaluation  07/19/17    Authorization Type  BCBS/ coinsurance 30 visit limit combine with PT/OT/Chiropractor    Authorization Time Period  speaking with insurance company but pt willing to pay out of pocket to cont    Authorization - Visit Number  34    Authorization - Number of Visits  30    PT Start Time  0807    PT Stop Time  0845    PT Time Calculation (min)  38 min    Activity Tolerance  Patient tolerated treatment well    Behavior During Therapy  [facility] for tasks assessed/performed       Past Medical History:  Diagnosis Date  . Cancer ([place]) 2018   endometrial   . Headache    hx migraines yrs ago  . Malaria as child  . Miscarriage    x 2  . Pneumonia yrs ago  . Seizures ([place])    petite mal seizure in high school x 1, none since    Past Surgical History:  Procedure Laterality Date  . DENTAL SURGERY     gum graft 20 yrs ago and again on 08/26/16  . DILATATION & CURETTAGE/HYSTEROSCOPY WITH MYOSURE N/A 08/11/2016   Procedure: DILATATION & CURETTAGE/HYSTEROSCOPY WITH MYOSURE;  Surgeon: Jerelyn Charles, MD;  Location: [facility];  Service: Gynecology;  Laterality: N/A;  polyp removal  . IR FLUORO GUIDE PORT INSERTION RIGHT  11/10/2016  . IR REMOVAL TUN ACCESS W/ PORT W/O FL MOD SED  03/11/2017  . IR US GUIDE VASC ACCESS RIGHT  11/10/2016  . LYMPH NODE BIOPSY N/A 09/01/2016   Procedure: SENTINEL LYMPH NODE BIOPSY;  Surgeon: Everitt Amber, MD;  Location: [facility];  Service: Gynecology;  Laterality: N/A;  . ROBOTIC ASSISTED TOTAL HYSTERECTOMY WITH BILATERAL SALPINGO OOPHERECTOMY Bilateral  09/01/2016   Procedure: ROBOTIC ASSISTED TOTAL HYSTERECTOMY WITH BILATERAL SALPINGO OOPHORECTOMY;  Surgeon: Everitt Amber, MD;  Location: [facility];  Service: Gynecology;  Laterality: Bilateral;  . WISDOM TOOTH EXTRACTION      There were no vitals filed for this visit.  Subjective Assessment - 06/22/17 0812    Subjective  I am on the medium size egg with my kegels. Pain is level 1 most of the time.  Changing her pad 2 time per day compared to 3-4 times per day. I leak with sit to stand and cough.  I am able to walk futher, 1 mile with decreased shortness of breath.     Pertinent History  ovarian cancer with complete hysterectomy 09/01/16 and SLNB, completed chemotherapy on 02/23/17    Patient Stated Goals  to improve LE strength    Currently in Pain?  Yes    Pain Score  1     Pain Location  Abdomen    Pain Orientation  Lower    Pain Descriptors / Indicators  Aching    Pain Type  Chronic pain    Pain Onset  More than a month ago    Pain Frequency  Intermittent    Aggravating Factors   not sure what aggravates pain.     Pain Relieving  Factors  heat, rest    Multiple Pain Sites  Yes    Pain Score  1    Pain Location  Knee    Pain Orientation  Right;Left    Pain Descriptors / Indicators  Aching    Pain Type  Chronic pain    Pain Onset  More than a month ago    Pain Frequency  Intermittent    Aggravating Factors   steps    Pain Relieving Factors  rest                    Pelvic Floor Special Questions - 06/22/17 0001    Pelvic Floor Internal Exam  Patient confirms indentification and approves PT to assess pelvic floor integrity and treatment    Exam Type  Vaginal    Biofeedback  supine resting level is 1.5 uv, supine able to able to contract above 10 uv for 8 sec, sit to stand contract to 20 uv, stand contract 2 sec to 8 uv and quick flicks is 11 uv    Biofeedback sensor type  Surface vaginal        [facility] Adult PT Treatment/Exercise - 06/22/17 0001      Manual Therapy    Manual Therapy  Soft tissue mobilization    Soft tissue mobilization  perineal body, ischiocavernosus, bulbocavernosus       Trigger Point Dry Needling - 06/22/17 0818    Consent Given?  Yes    Muscles Treated Upper Body  -- ischiocavernosus, bulbocavernosus, perineal body    Muscles Treated Lower Body  -- twithch and elongation of muscle             PT Short Term Goals - 05/25/17 1149      PT SHORT TERM GOAL #1   Title  Pt will be able to complete 9 sit to stands in 30 sec to decrease risk of falls    Baseline  7, 04/28/17- 9    Status  Achieved      PT SHORT TERM GOAL #2   Title  Pt will demonstrate 3+/5 hip flexor strength to decrease risk of falls when ambulating    Baseline  3/5, 04/28/17- 3/5, 05/10/17- 3+/5 bilaterally    Status  Achieved      PT SHORT TERM GOAL #3   Title  Pt will report a 25% improvement in left knee pain to allow for improved mobility    Baseline  04/28/17- 50% improvement    Status  Achieved        PT Long Term Goals - 05/31/17 1225      PT LONG TERM GOAL #1   Title  Pt will be independent in a home exercise program for continued strengthening and stretching    Period  Weeks    Status  Achieved      PT LONG TERM GOAL #2   Title  Pt will demonstrate 4/5 knee flexor strength to decrease risk of falls    Baseline  3/5, 04/28/17- 3/5, 05/10/17- R 4/5, L 3+/5, 05/19/17- R 4/5, L 4/5    Time  8    Period  Weeks    Status  Achieved      PT LONG TERM GOAL #3   Title  Pt will report 75% improvement in left knee pain to allow pt to complete her job using proper body mechanincs    Baseline  04/28/17- 50% improvement, 05/10/17- 50-60% improvement, 05/19/17- 75% improvement    Time  8    Period  Weeks    Status  Achieved      PT LONG TERM GOAL #4   Title  Pt will demonstrate 4/5 bilateral ankle dorsiflexor strength to decrease risk of falls    Baseline  3/5, 04/28/17- R 4/5, L 3/5, 05/10/17- 3/5, 05/19/17- R 4+/5, L 4/5    Time  8    Period  Weeks     Status  Achieved      PT LONG TERM GOAL #5   Title  Pt will be able to complete 13 sit to stands in 30 seconds to decrease risk of falls    Baseline  7, 04/28/17- 9, 05/10/17- 10, 05/19/17- 11; 10 in 42 sec today (pt reported having some increased knee pain with this today) - 05/25/17    Time  8    Period  Weeks    Status  Partially Met            Plan - 06/22/17 0816    Clinical Impression Statement  Pateint is down to 2 pads per day compared to 4 pads. Patient pain level majority of time is 1/10. Patient has thickness around the perineal body. Patient resting level is 1.5 uv.  Patient able to contract to 10 uv  in supine for 8 seconds.  Patient contracts to 8 uv in sitting. Contract to 20 uv sit to stand.  Patient reports most leakage in sit to stand and coughing. Patient  will benefit from skilled therapy to improve tissue elongation and improve strength to reduce urinary leakage and pain with intercourse.     Rehab Potential  Excellent    Clinical Impairments Affecting Rehab Potential  pt with increased left knee and right shoulder pain    PT Frequency  2x / week    PT Duration  4 weeks    PT Treatment/Interventions  ADLs/Self Care Home Management;Electrical Stimulation;Neuromuscular re-education;Therapeutic exercise;Balance training;Manual techniques;Patient/family education;Therapeutic activities;Dry needling    PT Next Visit Plan  work on the pelvic floor to improve tissue mobility and reduce urinary leakage; see if dry needling helped    PT Home Exercise Plan  progress as needed    Consulted and Agree with Plan of Care  Patient       Patient will benefit from skilled therapeutic intervention in order to improve the following deficits and impairments:  Decreased balance, Decreased endurance, Decreased mobility, Difficulty walking, Impaired sensation, Decreased strength, Pain, Postural dysfunction, Increased edema, Increased muscle spasms, Increased fascial restricitons  Visit  Diagnosis: Other muscle spasm  Unspecified lack of coordination  Muscle weakness (generalized)     Problem List Patient Active Problem List   Diagnosis Date Noted  . Physical deconditioning 03/25/2017  . Obesity (BMI 30.0-34.9) 03/25/2017  . Drug-induced hyperglycemia 12/23/2016  . Port catheter in place 12/02/2016  . Dysuria 12/02/2016  . Peripheral neuropathy due to chemotherapy ([place]) 12/02/2016  . Acute rhinitis 11/04/2016  . Goals of care, counseling/discussion 11/04/2016  . 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 Endometrial cancer ([place]) 08/24/2016    Danielle Guzman, PT 06/22/17 8:59 AM   [facility] [address], [place], [place], [address] Phone: [phone]   Fax:  [phone]  Name: Danielle Guzman MRN: [id number] Date of Birth: 21-Aug-1965

## 2017-06-23 ENCOUNTER — Encounter: Payer: BLUE CROSS/BLUE SHIELD | Admitting: Physical Therapy

## 2017-06-23 ENCOUNTER — Encounter: Payer: Self-pay | Admitting: Gynecologic Oncology

## 2017-06-23 ENCOUNTER — Inpatient Hospital Stay: Payer: BLUE CROSS/BLUE SHIELD | Attending: Hematology and Oncology | Admitting: Gynecologic Oncology

## 2017-06-23 ENCOUNTER — Ambulatory Visit: Payer: BLUE CROSS/BLUE SHIELD | Admitting: Gynecologic Oncology

## 2017-06-23 VITALS — BP 129/73 | HR 56 | Temp 97.9°F | Resp 18 | Ht 66.0 in | Wt 207.0 lb

## 2017-06-23 DIAGNOSIS — Z8542 Personal history of malignant neoplasm of other parts of uterus: Secondary | ICD-10-CM | POA: Insufficient documentation

## 2017-06-23 DIAGNOSIS — R600 Localized edema: Secondary | ICD-10-CM | POA: Insufficient documentation

## 2017-06-23 DIAGNOSIS — Z90710 Acquired absence of both cervix and uterus: Secondary | ICD-10-CM | POA: Diagnosis not present

## 2017-06-23 DIAGNOSIS — Z87891 Personal history of nicotine dependence: Secondary | ICD-10-CM | POA: Diagnosis not present

## 2017-06-23 DIAGNOSIS — Z9221 Personal history of antineoplastic chemotherapy: Secondary | ICD-10-CM

## 2017-06-23 DIAGNOSIS — Z90722 Acquired absence of ovaries, bilateral: Secondary | ICD-10-CM | POA: Diagnosis not present

## 2017-06-23 DIAGNOSIS — C541 Malignant neoplasm of endometrium: Secondary | ICD-10-CM

## 2017-06-23 MED ORDER — FUROSEMIDE 20 MG PO TABS: 20.0000 mg | ORAL_TABLET | Freq: Every day | ORAL | 3 refills | Status: DC | PRN

## 2017-06-23 MED FILL — FUROSEMIDE 20 MG TABS: 20 | 30 days supply | Qty: 30 | Fill #0

## 2017-06-23 NOTE — Progress Notes (Signed)
Consult Note: Gyn-Onc  Consult was requested by Dr. Loletta Specter and Dr Stann Mainland for the evaluation of Danielle Guzman 52 y.o. female  CC:  Chief Complaint  Patient presents with  . Endometrial cancer [facility])    Assessment/Plan:  Danielle Guzman  is a 52 y.o.  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  She is s/p staging surgery on 09/01/16. And s/p adjuvat carboplatin and paclitaxel x 6 cycles between 11/12/2016 - 02/23/17.   She has some generalized LE edema (not consistent with lymphedema). Will try 20 lasix prn daily.  No evidence of recurrence on exam. Continue 3 monthly surveillance visits until December, 2020.  HPI: Danielle Guzman is a 52 year old [id number] who is seen in consultation at the request of Dr Stann Mainland and Dr Jerelyn Charles for endometrial cancer.  The patient reports early menopause at age 89 (all of her female relatives have had early menopause). She began experiencing postmenopausal bleeding in March 2018. She was seen by Dr Stann Mainland on 06/02/16 and a TVUS showed an endometrium of 1.8cm with a 3cm hypervascular polyp.  She was taken to the OR on 08/11/16 for a hysteroscopy/D&C with Dr Jerelyn Charles and the specimen showed endometrial adenocarcinoma with the majority of the specimen revealing grade 2 endometrioid endometrial cancer with a small focus (5%) of clear cell carcinoma and a few foci suspicious for serous carcinoma.  She is otherwise very healthy. She has had no prior surgeries or vaginal deliveries.  Her mother has a history of breast cancer x 3, she has a sister with a history of cervical cancer. Her maternal aunt has a history of breast cancer. A different maternal aunt had uterine and colon cancer. Her maternal cousin had uterine cancer. Her paternal grandmother had a diagnosis of colon cancer and her paternal cousins had hysterectomies for "abnromal cells" but no cancer.  Interval Hx: On 09/01/16 she underwent robotic  assisted total hysterectomy, BSO, SLN biopsy. Surgery was uncomplicated. The final pathology revealed a 1.8cm grade 2 endometrioid endometrial cancer ( with rare foci of squamous differentiation and a rare microscopic focus of serous differentiation). There was luminal foci of carcinoma within the right and left fallopian tubes and the right fallopian tube lumen involvement showed a focus of similar appearing chondroid stroma with adjacent focus of serous carcinoma.  The lymph nodes and cervix were negative for carcinoma. The uterine tumor showed no myometrial invasion and no LVSI.  Postoperatively she completed 6 cycles of carboplatin and paclitaxel between 11/12/17-12/11/9 after experiencing some trepidation regarding toxicity overwhelming benefits.   She has some persistent foot neuropathy post-chemotherapy and has some bilateral lower extremity edema. She see PT for neuropathy and pelvic pains.   Current Meds:  Outpatient Encounter Medications as of 06/23/2017  Medication Sig  . Ascorbic Acid (VITAMIN C PO) Take 1 tablet by mouth 3 (three) times daily as needed (for immune health support or cold symptoms.).  Marland Kitchen b complex vitamins tablet Take 1 tablet by mouth daily.  . Omega-3 Fatty Acids (FISH OIL) 1000 MG CAPS Take by mouth.  . Probiotic Product (PROBIOTIC DAILY PO) Take 1 capsule by mouth daily.  . Vitamin D, Ergocalciferol, (DRISDOL) 50000 units CAPS capsule Take 50,000 Units by mouth every 7 (seven) days.  . furosemide (LASIX) 20 MG tablet Take 1 tablet (20 mg total) by mouth daily as needed.  . [DISCONTINUED] morphine (MSIR) 15 MG tablet Take 1 tablet (15 mg  total) by mouth every 4 (four) hours as needed for severe pain. (Patient not taking: Reported on 03/29/2017)  . [DISCONTINUED] PREBIOTIC PRODUCT PO Take 1 capsule by mouth daily.   No facility-administered encounter medications on file as of 06/23/2017.     Allergy:  Allergies  Allergen Reactions  . Latex     Long exposure  causes a rash  . Tamiflu  [Oseltamivir Phosphate] Nausea Only    Social Hx:   Social History   Socioeconomic History  . Marital status: Married    Spouse name: [name]  . Number of children: 0  . Years of education: Not on file  . Highest education level: Not on file  Occupational History  . Occupation: massage therapist  Social Needs  . Financial resource strain: Not on file  . Food insecurity:    Worry: Not on file    Inability: Not on file  . Transportation needs:    Medical: Not on file    Non-medical: Not on file  Tobacco Use  . Smoking status: Former Smoker    Packs/day: 1.50    Years: 8.00    Pack years: 12.00    Types: Cigarettes  . Smokeless tobacco: Never Used  . Tobacco comment: quit smoking at age 75  Substance and Sexual Activity  . Alcohol use: Yes    Alcohol/week: 2.4 oz    Types: 4 Glasses of wine per week    Comment: wine occ  . Drug use: No  . Sexual activity: Yes    Birth control/protection: Post-menopausal  Lifestyle  . Physical activity:    Days per week: Not on file    Minutes per session: Not on file  . Stress: Not on file  Relationships  . Social connections:    Talks on phone: Not on file    Gets together: Not on file    Attends religious service: Not on file    Active member of club or organization: Not on file    Attends meetings of clubs or organizations: Not on file    Relationship status: Not on file  . Intimate partner violence:    Fear of current or ex partner: Not on file    Emotionally abused: Not on file    Physically abused: Not on file    Forced sexual activity: Not on file  Other Topics Concern  . Not on file  Social History Narrative  . Not on file    Past Surgical Hx:  Past Surgical History:  Procedure Laterality Date  . DENTAL SURGERY     gum graft 20 yrs ago and again on 08/26/16  . DILATATION & CURETTAGE/HYSTEROSCOPY WITH MYOSURE N/A 08/11/2016   Procedure: DILATATION & CURETTAGE/HYSTEROSCOPY WITH  MYOSURE;  Surgeon: Jerelyn Charles, MD;  Location: [facility];  Service: Gynecology;  Laterality: N/A;  polyp removal  . IR FLUORO GUIDE PORT INSERTION RIGHT  11/10/2016  . IR REMOVAL TUN ACCESS W/ PORT W/O FL MOD SED  03/11/2017  . IR US GUIDE VASC ACCESS RIGHT  11/10/2016  . LYMPH NODE BIOPSY N/A 09/01/2016   Procedure: SENTINEL LYMPH NODE BIOPSY;  Surgeon: Everitt Amber, MD;  Location: [facility];  Service: Gynecology;  Laterality: N/A;  . ROBOTIC ASSISTED TOTAL HYSTERECTOMY WITH BILATERAL SALPINGO OOPHERECTOMY Bilateral 09/01/2016   Procedure: ROBOTIC ASSISTED TOTAL HYSTERECTOMY WITH BILATERAL SALPINGO OOPHORECTOMY;  Surgeon: Everitt Amber, MD;  Location: [facility];  Service: Gynecology;  Laterality: Bilateral;  . WISDOM TOOTH EXTRACTION      Past  Medical Hx:  Past Medical History:  Diagnosis Date  . Cancer ([place]) 2018   endometrial   . Headache    hx migraines yrs ago  . Malaria as child  . Miscarriage    x 2  . Pneumonia yrs ago  . Seizures ([place])    petite mal seizure in high school x 1, none since    Past Gynecological History:  Premature menopause No LMP recorded. Patient has had a hysterectomy.  Family Hx:  Family History  Problem Relation Age of Onset  . Cancer Mother   . Cancer Sister        Cervix  . Cancer Maternal Aunt        Breast, uterine  . Cancer Paternal Grandmother        Colon    Review of Systems:  Constitutional  Feels well,    ENT Normal appearing ears and nares bilaterally Skin/Breast  No rash, sores, jaundice, itching, dryness Cardiovascular  No chest pain, shortness of breath, or edema  Pulmonary  No cough or wheeze.  Gastro Intestinal  No nausea, vomitting, or diarrhoea. No bright red blood per rectum, no abdominal pain, change in bowel movement, or constipation.  Genito Urinary  No frequency, urgency, dysuria, no bleeding Musculo Skeletal  + bilateral LE edema Neurologic  + foot neuropathy bilaterally  Psychology  No depression, anxiety, insomnia.    Vitals:  Blood pressure 129/73, pulse (!) 56, temperature 97.9 F (36.6 C), temperature source Oral, resp. rate 18, height 5\' 6"  (1.676 m), weight 207 lb (93.9 kg), SpO2 100 %.  Physical Exam: WD in NAD Neck  Supple NROM, without any enlargements.  Lymph Node Survey No cervical supraclavicular or inguinal adenopathy Cardiovascular  Pulse normal rate, regularity and rhythm. S1 and S2 normal.  Lungs  Clear to auscultation bilateraly, without wheezes/crackles/rhonchi. Good air movement.  Skin  No rash/lesions/breakdown  Psychiatry  Alert and oriented to person, place, and time  Abdomen  Normoactive bowel sounds, abdomen soft, non-tender and obese without evidence of hernia. Soft incisions.  Back No CVA tenderness Genito Urinary  : vaginal cuff with 2 small fronds of granulation tissue. No other lesions or masses.  Rectal  deferred Extremities  No bilateral cyanosis, clubbing or edema.  Thereasa Solo, MD  06/23/2017, 12:10 PM

## 2017-06-23 NOTE — Patient Instructions (Signed)
Please return to see Dr Denman George in July, 2019. Dr Denman George has prescribed Lasix to take once daily as needed for swelling. Stop if you become dizzy or lightheaded with standing.  Please notify Dr Denman George at phone number [phone] if you notice vaginal bleeding, new pelvic or abdominal pains, bloating, feeling full easy, or a change in bladder or bowel function.

## 2017-07-01 ENCOUNTER — Encounter: Payer: Self-pay | Admitting: Physical Therapy

## 2017-07-01 ENCOUNTER — Ambulatory Visit: Payer: BLUE CROSS/BLUE SHIELD | Admitting: Physical Therapy

## 2017-07-01 DIAGNOSIS — M62838 Other muscle spasm: Secondary | ICD-10-CM

## 2017-07-01 DIAGNOSIS — R279 Unspecified lack of coordination: Secondary | ICD-10-CM

## 2017-07-01 DIAGNOSIS — M6281 Muscle weakness (generalized): Secondary | ICD-10-CM

## 2017-07-01 NOTE — Patient Instructions (Signed)
Access Code: [email or web address]  URL: [email or web address]  Date: 07/01/2017  Prepared by: [place] with Pelvic Floor Contraction - 10 reps - 1 sets - 1x daily - 7x weekly Standing Hip Hinge - 10 reps - 1 sets - 1x daily - 7x wee [facility] [address] [place], [place] [address] Phone # [phone] Fax [phone]

## 2017-07-01 NOTE — Therapy (Signed)
[facility] [address] [place], [place], [address] Phone: [phone]   Fax:  [phone]  Physical Therapy Treatment  Patient Details  Name: Danielle Guzman MRN: [id number] Date of Birth: 1965/09/13 Referring Provider: Dr. Alvy Bimler   Encounter Date: 07/01/2017  PT End of Session - 07/01/17 0928    Visit Number  [id number]    Date for PT Re-Evaluation  07/19/17    Authorization Type  BCBS/ coinsurance 30 visit limit combine with PT/OT/Chiropractor    Authorization Time Period  speaking with insurance company but pt willing to pay out of pocket to cont    PT Start Time  0845    PT Stop Time  0928    PT Time Calculation (min)  43 min    Activity Tolerance  Patient tolerated treatment well    Behavior During Therapy  [facility] for tasks assessed/performed       Past Medical History:  Diagnosis Date  . Cancer ([place]) 2018   endometrial   . Headache    hx migraines yrs ago  . Malaria as child  . Miscarriage    x 2  . Pneumonia yrs ago  . 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 DENTAL SURGERY     gum graft 20 yrs ago and again on 08/26/16  . DILATATION & CURETTAGE/HYSTEROSCOPY WITH MYOSURE N/A 08/11/2016   Procedure: DILATATION & CURETTAGE/HYSTEROSCOPY WITH MYOSURE;  Surgeon: Jerelyn Charles, MD;  Location: [facility];  Service: Gynecology;  Laterality: N/A;  polyp removal  . IR FLUORO GUIDE PORT INSERTION RIGHT  11/10/2016  . IR REMOVAL TUN ACCESS W/ PORT W/O FL MOD SED  03/11/2017  . IR US GUIDE VASC ACCESS RIGHT  11/10/2016  . LYMPH NODE BIOPSY N/A 09/01/2016   Procedure: SENTINEL LYMPH NODE BIOPSY;  Surgeon: Everitt Amber, MD;  Location: [facility];  Service: Gynecology;  Laterality: N/A;  . ROBOTIC ASSISTED TOTAL HYSTERECTOMY WITH BILATERAL SALPINGO OOPHERECTOMY Bilateral 09/01/2016   Procedure: ROBOTIC ASSISTED TOTAL HYSTERECTOMY WITH BILATERAL SALPINGO  OOPHORECTOMY;  Surgeon: Everitt Amber, MD;  Location: [facility];  Service: Gynecology;  Laterality: Bilateral;  . WISDOM TOOTH EXTRACTION      There were no vitals filed for this visit.  Subjective Assessment - 07/01/17 0849    Subjective  The strength is getting better. Has the leakage with sit to stand and back and leaning over to pick items up.  I am still using the medium egg. Walk 1 mile then get out of breath. I went dancing and my knee hurt and I was out of breath.     Pertinent History  ovarian cancer with complete hysterectomy 09/01/16 and SLNB, completed chemotherapy on 02/23/17    Patient Stated Goals  to improve LE strength    Currently in Pain?  Yes    Pain Score  1     Pain Location  Abdomen    Pain Orientation  Lower    Pain Descriptors / Indicators  Aching    Pain Type  Chronic pain    Pain Onset  More than a month ago    Pain Frequency  Intermittent    Aggravating Factors   not sure what aggravates pain    Pain Relieving Factors  heat, rest    Multiple Pain Sites  No  Pelvic Floor Special Questions - 07/01/17 0001    Pelvic Floor Internal Exam  Patient confirms indentification and approves PT to assess pelvic floor integrity and treatment    Exam Type  Vaginal    Palpation  patient will bear down with coughing; tenderness located in bil. ischiocavernosus and bulbocavernosus; tightness along the introitus     Strength  good squeeze, good lift, able to hold agaisnt strong resistance after soft tissue work        [facility] Adult PT Treatment/Exercise - 07/01/17 0001      Neuro Re-ed    Neuro Re-ed Details   tapping of the pelvic floor muscles to improve circular contraction; sit to stand midway with pelvic floor contraction working on eccentric contraction of pelvic floor; coughing with pelvic floor contraction and abdominal bracing;       Manual Therapy   Manual Therapy  Internal Pelvic Floor    Internal Pelvic Floor  along the introitus,  sphincter; puborectalis             PT Education - 07/01/17 0927    Education provided  Yes    Education Details  Access Code: [email or web address]    Person(s) Educated  Patient    Methods  Explanation;Demonstration;Verbal cues;Handout    Comprehension  Verbalized understanding;Returned demonstration       PT Short Term Goals - 05/25/17 1149      PT SHORT TERM GOAL #1   Title  Pt will be able to complete 9 sit to stands in 30 sec to decrease risk of falls    Baseline  7, 04/28/17- 9    Status  Achieved      PT SHORT TERM GOAL #2   Title  Pt will demonstrate 3+/5 hip flexor strength to decrease risk of falls when ambulating    Baseline  3/5, 04/28/17- 3/5, 05/10/17- 3+/5 bilaterally    Status  Achieved      PT SHORT TERM GOAL #3   Title  Pt will report a 25% improvement in left knee pain to allow for improved mobility    Baseline  04/28/17- 50% improvement    Status  Achieved        PT Long Term Goals - 07/01/17 0932      PT LONG TERM GOAL #6   Title  urinary leakage decreased >/= 75% so she is using 1 light pad per day due to increased pelvic floor strength    Baseline  40% better    Time  8    Period  Weeks    Status  On-going      PT LONG TERM GOAL #7   Title  able to have penile penetration with >/= 50% decreased in pain    Baseline  20% better    Time  8    Status  On-going      PT LONG TERM GOAL #8   Title  Deep ache in the pelvis has decreased >/= 75% due to improved tissue mobility    Baseline  pain level more consistent with 1/10    Time  8    Period  Weeks    Status  On-going            Plan - 07/01/17 0856    Clinical Impression Statement  Patient will leak urine with sit to stand and back and bending over to pick up items. Patient has tightness in the ischicavernosis and sphincter muscle.  After tapping of the muscle and soft  tissue work able to improve the quality of the pelvic floor contraction.  Patient is still on the medium size egg with pelvic  floor strength. Patient is doing well with her HEP. Patient will benefit from skilled therapy to improve tissue elongation and improve strength to reduce urinary leakage and pain with intercourse.     Rehab Potential  Excellent    Clinical Impairments Affecting Rehab Potential  pt with increased left knee and right shoulder pain    PT Frequency  2x / week    PT Duration  4 weeks    PT Treatment/Interventions  ADLs/Self Care Home Management;Electrical Stimulation;Neuromuscular re-education;Therapeutic exercise;Balance training;Manual techniques;Patient/family education;Therapeutic activities;Dry needling    PT Next Visit Plan  work on the pelvic floor to improve tissue mobility and reduce urinary leakage    PT Home Exercise Plan  Access Code: [email or web address]    Consulted and Agree with Plan of Care  Patient       Patient will benefit from skilled therapeutic intervention in order to improve the following deficits and impairments:  Decreased balance, Decreased endurance, Decreased mobility, Difficulty walking, Impaired sensation, Decreased strength, Pain, Postural dysfunction, Increased edema, Increased muscle spasms, Increased fascial restricitons  Visit Diagnosis: Other muscle spasm  Unspecified lack of coordination  Muscle weakness (generalized)     Problem List Patient Active Problem List   Diagnosis Date Noted  . Physical deconditioning 03/25/2017  . Obesity (BMI 30.0-34.9) 03/25/2017  . Drug-induced hyperglycemia 12/23/2016  . Port catheter in place 12/02/2016  . Dysuria 12/02/2016  . 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 Acute rhinitis 11/04/2016  . Goals of care, counseling/discussion 11/04/2016  . 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 Endometrial cancer ([place]) 08/24/2016    Earlie Counts, PT 07/01/17 9:33 AM   [facility] [address], [place], [place],  [address] Phone: [phone]   Fax:  [phone]  Name: Danielle Guzman MRN: [id number] Date of Birth: 1965/06/20

## 2017-07-06 ENCOUNTER — Encounter: Payer: BLUE CROSS/BLUE SHIELD | Admitting: Physical Therapy

## 2017-07-07 ENCOUNTER — Ambulatory Visit: Payer: BLUE CROSS/BLUE SHIELD | Admitting: Physical Therapy

## 2017-07-07 ENCOUNTER — Encounter: Payer: Self-pay | Admitting: Physical Therapy

## 2017-07-07 ENCOUNTER — Encounter: Payer: BLUE CROSS/BLUE SHIELD | Admitting: Physical Therapy

## 2017-07-07 DIAGNOSIS — M62838 Other muscle spasm: Secondary | ICD-10-CM

## 2017-07-07 DIAGNOSIS — M6281 Muscle weakness (generalized): Secondary | ICD-10-CM

## 2017-07-07 DIAGNOSIS — R279 Unspecified lack of coordination: Secondary | ICD-10-CM

## 2017-07-07 NOTE — Therapy (Signed)
[facility] [address], [place], [place], [address] Phone: [phone]   Fax:  [phone]  Physical Therapy Treatment  Patient Details  Name: Danielle Guzman MRN: [id number] Date of Birth: [birth year] Referring Provider: Dr. Alvy Bimler   Encounter Date: 07/07/2017  PT End of Session - 07/07/17 0841    Visit Number  36    Date for PT Re-Evaluation  07/19/17    Authorization Type  BCBS/ coinsurance 30 visit limit combine with PT/OT/Chiropractor    Authorization Time Period  insurance will not pay for more visits. Patient is paying out of pocket    Authorization - Visit Number  35    Authorization - Number of Visits  30    PT Start Time  0800    PT Stop Time  [phone]    PT Time Calculation (min)  42 min    Activity Tolerance  Patient tolerated treatment well    Behavior During Therapy  WFL for tasks assessed/performed       Past Medical History:  Diagnosis Date  . Cancer ([place]) 2018   endometrial   . Headache    hx migraines yrs ago  . Malaria as child  . Miscarriage    x 2  . Pneumonia yrs ago  . Seizures ([place])    petite mal seizure in high school x 1, none since    Past Surgical History:  Procedure Laterality Date  . DENTAL SURGERY     gum graft 20 yrs ago and again on 08/26/16  . DILATATION & CURETTAGE/HYSTEROSCOPY WITH MYOSURE N/A 08/11/2016   Procedure: DILATATION & CURETTAGE/HYSTEROSCOPY WITH MYOSURE;  Surgeon: Jerelyn Charles, MD;  Location: [facility];  Service: Gynecology;  Laterality: N/A;  polyp removal  . IR FLUORO GUIDE PORT INSERTION RIGHT  11/10/2016  . IR REMOVAL TUN ACCESS W/ PORT W/O FL MOD SED  03/11/2017  . IR US GUIDE VASC ACCESS RIGHT  11/10/2016  . LYMPH NODE BIOPSY N/A 09/01/2016   Procedure: SENTINEL LYMPH NODE BIOPSY;  Surgeon: Everitt Amber, MD;  Location: [facility];  Service: Gynecology;  Laterality: N/A;  . ROBOTIC ASSISTED TOTAL HYSTERECTOMY WITH BILATERAL SALPINGO OOPHERECTOMY Bilateral 09/01/2016    Procedure: ROBOTIC ASSISTED TOTAL HYSTERECTOMY WITH BILATERAL SALPINGO OOPHORECTOMY;  Surgeon: Everitt Amber, MD;  Location: [facility];  Service: Gynecology;  Laterality: Bilateral;  . WISDOM TOOTH EXTRACTION      There were no vitals filed for this visit.  Subjective Assessment - 07/07/17 0806    Subjective  I have had this cough  that is making it difficult to sleep. I am at a steady 1/10.     Pertinent History  ovarian cancer with complete hysterectomy 09/01/16 and SLNB, completed chemotherapy on 02/23/17    Patient Stated Goals  to improve LE strength    Currently in Pain?  Yes    Pain Score  1     Pain Location  Abdomen    Pain Orientation  Lower    Pain Descriptors / Indicators  Aching    Pain Type  Chronic pain    Pain Onset  More than a month ago    Pain Frequency  Intermittent    Aggravating Factors   after intercourse    Pain Relieving Factors  heat, rest    Multiple Pain Sites  No                    Pelvic Floor Special Questions - 07/07/17 0001  Pelvic Floor Internal Exam  Patient confirms indentification and approves PT to assess pelvic floor integrity and treatment    Exam Type  Vaginal    Palpation  patient will bear down with coughing; tenderness located in bil. ischiocavernosus and bulbocavernosus; tightness along the introitus , post. levator ani    Strength  good squeeze, good lift, able to hold agaisnt strong resistance after soft tissue work        [facility] Adult PT Treatment/Exercise - 07/07/17 0001      Manual Therapy   Manual Therapy  Internal Pelvic Floor    Internal Pelvic Floor  along the introitus, sphincter; puborectalis, levator ani               PT Short Term Goals - 05/25/17 1149      PT SHORT TERM GOAL #1   Title  Pt will be able to complete 9 sit to stands in 30 sec to decrease risk of falls    Baseline  7, 04/28/17- 9    Status  Achieved      PT SHORT TERM GOAL #2   Title  Pt will demonstrate 3+/5 hip flexor strength  to decrease risk of falls when ambulating    Baseline  3/5, 04/28/17- 3/5, 05/10/17- 3+/5 bilaterally    Status  Achieved      PT SHORT TERM GOAL #3   Title  Pt will report a 25% improvement in left knee pain to allow for improved mobility    Baseline  04/28/17- 50% improvement    Status  Achieved        PT Long Term Goals - 07/07/17 1610      PT LONG TERM GOAL #1   Title  Pt will be independent in a home exercise program for continued strengthening and stretching    Time  8    Period  Weeks    Status  Achieved      PT LONG TERM GOAL #6   Title  urinary leakage decreased >/= 75% so she is using 1 light pad per day due to increased pelvic floor strength    Baseline  70% better; 2 per day compared to 5 per day    Time  8    Period  Weeks    Status  On-going      PT LONG TERM GOAL #7   Title  able to have penile penetration with >/= 50% decreased in pain    Baseline  50% better; pain entrance and post. and deeper    Time  8    Period  Weeks    Status  On-going      PT LONG TERM GOAL #8   Title  Deep ache in the pelvis has decreased >/= 75% due to improved tissue mobility    Baseline  pain level more consistent with 1/10 and 80% better    Time  8    Period  Weeks    Status  On-going            Plan - 07/07/17 9604    Clinical Impression Statement  After soft tissue work the achiness has decreased by 70%.  Patient pelvis floor strength is 4/5.  Patient has a cough that she will be seeing the doctor for and it is putting a strain on the pelvic floor and patient will bulge the perineum with a cough. Patient has trigger points in the posterior pelvic floor that will reproduce the achiness in her lower  abdomen.  Patient reports her urinary leakage decreased by 70% and is down to 2 pads but her coughing is making it difficult. During intercourse pain with entrance and posteriorly. Patient will benefit from skilled therapy to improve tissue elongation and imporve strength to reduce  urianry leakage and pain with intercours.     Rehab Potential  Excellent    Clinical Impairments Affecting Rehab Potential  pt with increased left knee and right shoulder pain    PT Frequency  2x / week    PT Duration  4 weeks    PT Treatment/Interventions  ADLs/Self Care Home Management;Electrical Stimulation;Neuromuscular re-education;Therapeutic exercise;Balance training;Manual techniques;Patient/family education;Therapeutic activities;Dry needling    PT Next Visit Plan  write renewal for every other week; internal soft tissue work to work on [place]  progress as needed    Consulted and Agree with Plan of Care  Patient       Patient will benefit from skilled therapeutic intervention in order to improve the following deficits and impairments:  Decreased balance, Decreased endurance, Decreased mobility, Difficulty walking, Impaired sensation, Decreased strength, Pain, Postural dysfunction, Increased edema, Increased muscle spasms, Increased fascial restricitons  Visit Diagnosis: Other muscle spasm  Unspecified lack of coordination  Muscle weakness (generalized)     Problem List Patient Active Problem List   Diagnosis Date Noted  . Physical deconditioning 03/25/2017  . Obesity (BMI 30.0-34.9) 03/25/2017  . Drug-induced hyperglycemia 12/23/2016  . Port catheter in place 12/02/2016  . Dysuria 12/02/2016  . 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 Acute rhinitis 11/04/2016  . Goals of care, counseling/discussion 11/04/2016  . 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 Endometrial cancer ([place]) 08/24/2016    Earlie Counts, PT 07/07/17 8:46 AM   [facility] [address] [place], [place], [address] Phone: [phone]   Fax:  [phone]  Name: Danielle Guzman MRN: [id number] Date of Birth: Nov 29, 1965

## 2017-07-13 ENCOUNTER — Ambulatory Visit: Payer: BLUE CROSS/BLUE SHIELD | Admitting: Physical Therapy

## 2017-07-13 ENCOUNTER — Encounter: Payer: Self-pay | Admitting: Physical Therapy

## 2017-07-13 DIAGNOSIS — R279 Unspecified lack of coordination: Secondary | ICD-10-CM

## 2017-07-13 DIAGNOSIS — M6281 Muscle weakness (generalized): Secondary | ICD-10-CM

## 2017-07-13 DIAGNOSIS — M62838 Other muscle spasm: Secondary | ICD-10-CM | POA: Diagnosis not present

## 2017-07-13 NOTE — Therapy (Signed)
[facility] [address], [place], [place], [address] Phone: [phone]   Fax:  [phone]  Physical Therapy Treatment  Patient Details  Name: Danielle Guzman MRN: [id number] Date of Birth: 02/04/1966 Referring Provider: Dr. Alvy Bimler   Encounter Date: 07/13/2017  PT End of Session - 07/13/17 0930    Visit Number  37    Number of Visits  54    Date for PT Re-Evaluation  10/11/17    Authorization Type  BCBS/ coinsurance 30 visit limit combine with PT/OT/Chiropractor    Authorization Time Period  insurance will not pay for more visits. Patient is paying out of pocket    Authorization - Visit Number  31    Authorization - Number of Visits  30    PT Start Time  0845    PT Stop Time  0930    PT Time Calculation (min)  45 min    Activity Tolerance  Patient tolerated treatment well    Behavior During Therapy  WFL for tasks assessed/performed       Past Medical History:  Diagnosis Date  . Cancer ([place]) 2018   endometrial   . Headache    hx migraines yrs ago  . Malaria as child  . Miscarriage    x 2  . Pneumonia yrs ago  . 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 DENTAL SURGERY     gum graft 20 yrs ago and again on 08/26/16  . DILATATION & CURETTAGE/HYSTEROSCOPY WITH MYOSURE N/A 08/11/2016   Procedure: DILATATION & CURETTAGE/HYSTEROSCOPY WITH MYOSURE;  Surgeon: Jerelyn Charles, MD;  Location: [facility];  Service: Gynecology;  Laterality: N/A;  polyp removal  . IR FLUORO GUIDE PORT INSERTION RIGHT  11/10/2016  . IR REMOVAL TUN ACCESS W/ PORT W/O FL MOD SED  03/11/2017  . IR US GUIDE VASC ACCESS RIGHT  11/10/2016  . LYMPH NODE BIOPSY N/A 09/01/2016   Procedure: SENTINEL LYMPH NODE BIOPSY;  Surgeon: Everitt Amber, MD;  Location: [facility];  Service: Gynecology;  Laterality: N/A;  . ROBOTIC ASSISTED TOTAL HYSTERECTOMY WITH BILATERAL SALPINGO  OOPHERECTOMY Bilateral 09/01/2016   Procedure: ROBOTIC ASSISTED TOTAL HYSTERECTOMY WITH BILATERAL SALPINGO OOPHORECTOMY;  Surgeon: Everitt Amber, MD;  Location: [facility];  Service: Gynecology;  Laterality: Bilateral;  . WISDOM TOOTH EXTRACTION      There were no vitals filed for this visit.  Subjective Assessment - 07/13/17 0851    Subjective  I have had alot of urinary leakage due to my coughing.  I have a infection and taking antibiotics.  I am having pain in the lower abdominal and have to bring my knee up and cough to reduce pain.  I have had a pain in the right lateral rib cage.  I had a fever for 3 days.      Pertinent History  ovarian cancer with complete hysterectomy 09/01/16 and SLNB, completed chemotherapy on 02/23/17    Patient Stated Goals  to improve LE strength    Currently in Pain?  Yes    Pain Score  1     Pain Location  Abdomen    Pain Orientation  Lower    Pain Descriptors / Indicators  Aching    Pain Type  Chronic pain    Pain Onset  More than a month ago    Pain Frequency  Intermittent    Aggravating  Factors   after intercourse    Pain Relieving Factors  heat, rest     Multiple Pain Sites  Yes    Pain Score  9    Pain Location  Abdomen    Pain Orientation  Left;Upper    Pain Descriptors / Indicators  Aching;Dull    Pain Type  Acute pain    Pain Onset  In the past 7 days    Pain Frequency  Constant    Aggravating Factors   coughing increases    Pain Relieving Factors  pull left knee up into chest         [facility] PT Assessment - 07/13/17 0001      Assessment   Medical Diagnosis  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 Alvy Bimler    Onset Date/Surgical Date  09/01/17      Precautions   Precautions  Other (comment)    Precaution Comments  at risk for LE lymphedema      Restrictions   Weight Bearing Restrictions  No      Balance Screen   Has the patient fallen in the past 6 months  No    Has the patient had a decrease in  activity level because of a fear of falling?   No    Is the patient reluctant to leave their home because of a fear of falling?   No      Home Film/video editor residence    Living Arrangements  Spouse/significant other    Available Help at Discharge  Family    Type of [place] to enter    Entrance Stairs-Number of Steps  6      Prior Function   Level of Independence  Independent      Cognition   Overall Cognitive Status  Within Functional Limits for tasks assessed      Posture/Postural Control   Posture/Postural Control  No significant limitations      Strength   Right Hip Flexion  4/5    Right Hip ABduction  3+/5    Right Hip ADduction  3/5    Left Hip Flexion  4/5    Left Hip ABduction  3+/5    Left Hip ADduction  3/5      Palpation   Palpation comment  tenderness located in the right upper rib cage with most tendernss located on the lateral aspect; ; decreased mobility of the right lateral rib cage;                 Pelvic Floor Special Questions - 07/13/17 0001    Urinary Leakage  Yes    Pad use  2 pads    Activities that cause leaking  Coughing;Sneezing    Palpation  patient will bear down with coughing; tenderness located in bil. ischiocavernosus and bulbocavernosus; tightness along the introitus , post. levator ani    Strength  good squeeze, good lift, able to hold agaisnt strong resistance after soft tissue work        [facility] Adult PT Treatment/Exercise - 07/13/17 0001      Manual Therapy   Manual Therapy  Soft tissue mobilization;Joint mobilization    Joint Mobilization  right rib cage mobilization to open up the rib cage and improve diaphgramatic breathing to improve the excursion of the pelvic floor    Soft tissue mobilization  diaphgram, right lateral  rib 3-6 cage, transverse abdominus, left psoas, left obique       Trigger Point Dry Needling - 07/13/17 6073    Consent Given?  Yes    Muscles  Treated Upper Body  Iliopsoas transvers abdominus, left obliques    Muscles Treated Lower Body  -- elongation of tissue, trigger point release             PT Short Term Goals - 05/25/17 1149      PT SHORT TERM GOAL #1   Title  Pt will be able to complete 9 sit to stands in 30 sec to decrease risk of falls    Baseline  7, 04/28/17- 9    Status  Achieved      PT SHORT TERM GOAL #2   Title  Pt will demonstrate 3+/5 hip flexor strength to decrease risk of falls when ambulating    Baseline  3/5, 04/28/17- 3/5, 05/10/17- 3+/5 bilaterally    Status  Achieved      PT SHORT TERM GOAL #3   Title  Pt will report a 25% improvement in left knee pain to allow for improved mobility    Baseline  04/28/17- 50% improvement    Status  Achieved        PT Long Term Goals - 07/13/17 1359      PT LONG TERM GOAL #6   Title  urinary leakage decreased >/= 75% so she is using 1 light pad per day due to increased pelvic floor strength    Baseline  has been worse due to having an upper respiratory infection and coughing alot     Time  8    Period  Weeks    Status  On-going      PT LONG TERM GOAL #7   Title  able to have penile penetration with >/= 50% decreased in pain    Baseline  50% better; pain entrance and post. and deeper; has not had intercourse recently due to being sick    Time  8    Period  Weeks    Status  On-going      PT LONG TERM GOAL #8   Title  Deep ache in the pelvis has decreased >/= 75% due to improved tissue mobility    Baseline  pain level more consistent with 1/10 and 80% better    Period  Weeks    Status  On-going            Plan - 07/13/17 0931    Clinical Impression Statement  Patient pain level decreased to 2/10 after manual skills.  Patient has been coughing due to respiratory infections making her have to bring left knee to ches when coughing due ot abdominals and daiaphgram going into spasm. Patient had decreased right rib mobility, decrreased left diaphgram  and abdominal muscle excursions with trigger points. Patient has had increased in urinary leakage due to the deep coughs.  Patient will benefiit from skilled therapy to therapy to improve tissue elongation and improve strength to reduce urinary leakage and pain with intercourse.     Rehab Potential  Excellent    Clinical Impairments Affecting Rehab Potential  pt with increased left knee and right shoulder pain    PT Frequency  2x / week    PT Duration  4 weeks    PT Treatment/Interventions  ADLs/Self Care Home Management;Electrical Stimulation;Neuromuscular re-education;Therapeutic exercise;Balance training;Manual techniques;Patient/family education;Therapeutic activities;Dry needling    PT Next Visit Plan   internal soft tissue  work to work on [employer]; abdominal massage; pelvic floor strength with movement    PT Home Exercise Plan  progress as needed    Consulted and Agree with Plan of Care  Patient       Patient will benefit from skilled therapeutic intervention in order to improve the following deficits and impairments:  Decreased balance, Decreased endurance, Decreased mobility, Difficulty walking, Impaired sensation, Decreased strength, Pain, Postural dysfunction, Increased edema, Increased muscle spasms, Increased fascial restricitons  Visit Diagnosis: Other muscle spasm - Plan: PT plan of care cert/re-cert  Unspecified lack of coordination - Plan: PT plan of care cert/re-cert  Muscle weakness (generalized) - Plan: PT plan of care cert/re-cert     Problem List Patient Active Problem List   Diagnosis Date Noted  . Physical deconditioning 03/25/2017  . Obesity (BMI 30.0-34.9) 03/25/2017  . Drug-induced hyperglycemia 12/23/2016  . Port catheter in place 12/02/2016  . Dysuria 12/02/2016  . 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 Acute rhinitis 11/04/2016  . Goals of care, counseling/discussion 11/04/2016  . Secondary malignant neoplasm of fallopian tube  ([place]) 09/30/2016  . Endometrial cancer ([place]) 08/24/2016    Earlie Counts, PT 07/13/17 2:03 PM   [facility] [address], [place], [place], [address] Phone: [phone]   Fax:  [phone]  Name: Danielle Guzman MRN: [id number] Date of Birth: 25-Oct-1965

## 2017-07-14 ENCOUNTER — Encounter: Payer: BLUE CROSS/BLUE SHIELD | Admitting: Physical Therapy

## 2017-07-27 ENCOUNTER — Encounter: Payer: Self-pay | Admitting: Physical Therapy

## 2017-07-27 ENCOUNTER — Ambulatory Visit: Payer: BLUE CROSS/BLUE SHIELD | Attending: Hematology and Oncology | Admitting: Physical Therapy

## 2017-07-27 DIAGNOSIS — M6281 Muscle weakness (generalized): Secondary | ICD-10-CM | POA: Insufficient documentation

## 2017-07-27 DIAGNOSIS — M62838 Other muscle spasm: Secondary | ICD-10-CM | POA: Insufficient documentation

## 2017-07-27 DIAGNOSIS — R279 Unspecified lack of coordination: Secondary | ICD-10-CM | POA: Diagnosis present

## 2017-07-27 NOTE — Therapy (Signed)
[facility] [address] [place], [place], [address] Phone: [phone]   Fax:  [phone]  Physical Therapy Treatment  Patient Details  Name: Danielle Guzman MRN: [id number] Date of Birth: Nov 27, 1965 Referring Provider: Dr. Alvy Bimler   Encounter Date: 07/27/2017  PT End of Session - 07/27/17 0929    Visit Number  50    Date for PT Re-Evaluation  10/11/17    Authorization Type  BCBS/ coinsurance 30 visit limit combine with PT/OT/Chiropractor    Authorization Time Period  insurance will not pay for more visits. Patient is paying out of pocket    Authorization - Visit Number  38    Authorization - Number of Visits  30    PT Start Time  0845    PT Stop Time  0925    PT Time Calculation (min)  40 min    Activity Tolerance  Patient tolerated treatment well    Behavior During Therapy  [facility] for tasks assessed/performed       Past Medical History:  Diagnosis Date  . Cancer ([place]) 2018   endometrial   . Headache    hx migraines yrs ago  . Malaria as child  . Miscarriage    x 2  . Pneumonia yrs ago  . Seizures ([place])    petite mal seizure in high school x 1, none since    Past Surgical History:  Procedure Laterality Date  . DENTAL SURGERY     gum graft 20 yrs ago and again on 08/26/16  . DILATATION & CURETTAGE/HYSTEROSCOPY WITH MYOSURE N/A 08/11/2016   Procedure: DILATATION & CURETTAGE/HYSTEROSCOPY WITH MYOSURE;  Surgeon: Jerelyn Charles, MD;  Location: [facility];  Service: Gynecology;  Laterality: N/A;  polyp removal  . IR FLUORO GUIDE PORT INSERTION RIGHT  11/10/2016  . IR REMOVAL TUN ACCESS W/ PORT W/O FL MOD SED  03/11/2017  . IR US GUIDE VASC ACCESS RIGHT  11/10/2016  . LYMPH NODE BIOPSY N/A 09/01/2016   Procedure: SENTINEL LYMPH NODE BIOPSY;  Surgeon: Everitt Amber, MD;  Location: [facility];  Service: Gynecology;  Laterality: N/A;  . ROBOTIC ASSISTED TOTAL HYSTERECTOMY WITH BILATERAL SALPINGO OOPHERECTOMY Bilateral 09/01/2016    Procedure: ROBOTIC ASSISTED TOTAL HYSTERECTOMY WITH BILATERAL SALPINGO OOPHORECTOMY;  Surgeon: Everitt Amber, MD;  Location: [facility];  Service: Gynecology;  Laterality: Bilateral;  . WISDOM TOOTH EXTRACTION      There were no vitals filed for this visit.  Subjective Assessment - 07/27/17 0849    Subjective  I am not coughing as much.  I did not doing my pelvic floor exercises since I was coughing so much.  Pelvic pain constant ache at 1/10.  Leakage happens mostly with coughing and getting up and down from a chair. It is just a small leakage.     Pertinent History  ovarian cancer with complete hysterectomy 09/01/16 and SLNB, completed chemotherapy on 02/23/17    Patient Stated Goals  to improve LE strength    Currently in Pain?  Yes    Pain Score  1     Pain Orientation  Lower    Pain Descriptors / Indicators  Aching    Pain Type  Chronic pain    Pain Onset  More than a month ago    Pain Frequency  Constant    Aggravating Factors   after intercourse    Pain Relieving Factors  heat, rest    Multiple Pain Sites  No  Pelvic Floor Special Questions - 07/27/17 0001    Pelvic Floor Internal Exam  Patient confirms indentification and approves PT to assess pelvic floor integrity and treatment    Exam Type  Vaginal;Rectal both at same time to release between anal and vaginal canal        [facility] Adult PT Treatment/Exercise - 07/27/17 0001      Self-Care   Self-Care  Other Self-Care Comments    Other Self-Care Comments   discussed with intercourse and how deep penetration is paiful and feels like something is hard and blocking her.       Manual Therapy   Manual Therapy  Internal Pelvic Floor    Internal Pelvic Floor  one finger in the introitus and rectum to release the tissue between the anus and vaginal area, along the pelvic wall anteriorly and posteriorly in right sidely             PT Education - 07/27/17 0926    Education provided  Yes     Education Details  information how to use her largest dilator, information on products to prevent deep penetration with intercourse    Person(s) Educated  Patient    Methods  Explanation;Demonstration;Verbal cues;Handout    Comprehension  Verbalized understanding;Returned demonstration       PT Short Term Goals - 05/24/17 1054      PT SHORT TERM GOAL #5   Title  ability to have penile penetration with her husband with pain decreased >/= 20% better     Time  8    Period  Weeks    Status  Achieved        PT Long Term Goals - 07/27/17 0936      PT LONG TERM GOAL #6   Title  urinary leakage decreased >/= 75% so she is using 1 light pad per day due to increased pelvic floor strength    Baseline  2 pads    Time  8    Period  Weeks    Status  On-going      PT LONG TERM GOAL #7   Title  able to have penile penetration with >/= 50% decreased in pain    Baseline  trouble with deep penetration    Time  8    Period  Weeks    Status  On-going      PT LONG TERM GOAL #8   Title  Deep ache in the pelvis has decreased >/= 75% due to improved tissue mobility    Baseline  pain level more consistent with 1/10 and 80% better    Time  8    Period  Weeks    Status  Achieved            Plan - 07/27/17 0932    Clinical Impression Statement  Patient lower abdominal pain remains a 1/10 and is constant. Patient has pain with deep penetration and feels like it is riping.  Patient has leakage with sit to stand and back and coughing.  Patient leaks are small amounts.  Patient had releasing of pelvic floor tissue during the internal soft tissue work.  Patient will benefit from skilled therapy to work on the urinary leakage and deep penetrative pain.     Rehab Potential  Excellent    Clinical Impairments Affecting Rehab Potential  pt with increased left knee and right shoulder pain    PT Frequency  2x / week    PT Duration  4 weeks  PT Treatment/Interventions  ADLs/Self Care Home  Management;Electrical Stimulation;Neuromuscular re-education;Therapeutic exercise;Balance training;Manual techniques;Patient/family education;Therapeutic activities;Dry needling    PT Next Visit Plan   internal soft tissue work to work on achiness;  pelvic floor strength with movement; sitting with fingers on the perineaum and coccy going from sit to stand and back    PT Home Exercise Plan  progress as needed    Recommended Other Services  renewal on 4/30 /2019 is signed by the MD    Consulted and Agree with Plan of Care  Patient       Patient will benefit from skilled therapeutic intervention in order to improve the following deficits and impairments:  Decreased balance, Decreased endurance, Decreased mobility, Difficulty walking, Impaired sensation, Decreased strength, Pain, Postural dysfunction, Increased edema, Increased muscle spasms, Increased fascial restricitons  Visit Diagnosis: Other muscle spasm  Unspecified lack of coordination  Muscle weakness (generalized)     Problem List Patient Active Problem List   Diagnosis Date Noted  . Physical deconditioning 03/25/2017  . Obesity (BMI 30.0-34.9) 03/25/2017  . Drug-induced hyperglycemia 12/23/2016  . Port catheter in place 12/02/2016  . Dysuria 12/02/2016  . 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 Acute rhinitis 11/04/2016  . Goals of care, counseling/discussion 11/04/2016  . Secondary malignant neoplasm of fallopian tube ([place]) 09/30/2016  . Endometrial cancer ([place]) 08/24/2016    Earlie Counts, PT 07/27/17 9:38 AM   [facility] [address], [place], [place], [address] Phone: [phone]   Fax:  [phone]  Name: Danielle Guzman MRN: [id number] Date of Birth: 08/05/1965

## 2017-07-27 NOTE — Patient Instructions (Addendum)
PROTOCOL FOR DILATORS   1. Wash dilator with soap and water prior to insertion.    2. Lay on your back reclined. Knees are to be up and apart while on your bed or in the bathtub with warm water.   3. Lubricate the end of the dilator with a water-soluble lubricant.  4. Separate the labia.   5. Tense the pelvic floor muscles than relax; while relaxing, slide lubricated dilator into the vagina.    6. Tense muscles again while holding the dilator so it does not get pushed out; relax and slide it in a little further.   7. Try blowing out as if filling a balloon; this may relax the muscles and allow penetration.  Repeat blowing out to insert dilator further.  8. Keep dilator in for 10 minutes if tolerate, with the pelvic floor muscles relaxed to further stretch the canal.   9. Never force the dilator into the canal.  10. 1-2 times per day  [place] for Deep Dyspareunia   [employer] Protector Ring - Collision Dyspareunia   [email or web address] [facility] [address] [place],  [address] Phone # [phone] Fax [phone]

## 2017-08-10 ENCOUNTER — Encounter: Payer: BLUE CROSS/BLUE SHIELD | Admitting: Physical Therapy

## 2017-08-13 ENCOUNTER — Ambulatory Visit: Payer: BLUE CROSS/BLUE SHIELD | Admitting: Physical Therapy

## 2017-08-13 ENCOUNTER — Encounter: Payer: Self-pay | Admitting: Physical Therapy

## 2017-08-13 DIAGNOSIS — M62838 Other muscle spasm: Secondary | ICD-10-CM

## 2017-08-13 DIAGNOSIS — R279 Unspecified lack of coordination: Secondary | ICD-10-CM

## 2017-08-13 DIAGNOSIS — M6281 Muscle weakness (generalized): Secondary | ICD-10-CM

## 2017-08-13 NOTE — Therapy (Signed)
[facility] [address] [place], [place], [address] Phone: [phone]   Fax:  [phone]  Physical Therapy Treatment  Patient Details  Name: Danielle Guzman MRN: [id number] Date of Birth: 1965/04/04 Referring Provider: Dr. Alvy Bimler   Encounter Date: 08/13/2017  PT End of Session - 08/13/17 0935    Visit Number  32    Date for PT Re-Evaluation  10/11/17    Authorization Type  BCBS/ coinsurance 30 visit limit combine with PT/OT/Chiropractor    Authorization Time Period  insurance will not pay for more visits. Patient is paying out of pocket    Authorization - Visit Number  39    Authorization - Number of Visits  30    PT Start Time  0935    PT Stop Time  1015    PT Time Calculation (min)  40 min    Activity Tolerance  Patient tolerated treatment well    Behavior During Therapy  W[facility] for tasks assessed/performed       Past Medical History:  Diagnosis Date  . Cancer ([place]) 2018   endometrial   . Headache    hx migraines yrs ago  . Malaria as child  . Miscarriage    x 2  . Pneumonia yrs ago  . 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 DENTAL SURGERY     gum graft 20 yrs ago and again on 08/26/16  . DILATATION & CURETTAGE/HYSTEROSCOPY WITH MYOSURE N/A 08/11/2016   Procedure: DILATATION & CURETTAGE/HYSTEROSCOPY WITH MYOSURE;  Surgeon: Jerelyn Charles, MD;  Location: [facility];  Service: Gynecology;  Laterality: N/A;  polyp removal  . IR FLUORO GUIDE PORT INSERTION RIGHT  11/10/2016  . IR REMOVAL TUN ACCESS W/ PORT W/O FL MOD SED  03/11/2017  . IR US GUIDE VASC ACCESS RIGHT  11/10/2016  . LYMPH NODE BIOPSY N/A 09/01/2016   Procedure: SENTINEL LYMPH NODE BIOPSY;  Surgeon: Everitt Amber, MD;  Location: [facility];  Service: Gynecology;  Laterality: N/A;  . ROBOTIC ASSISTED TOTAL HYSTERECTOMY WITH BILATERAL SALPINGO OOPHERECTOMY Bilateral 09/01/2016    Procedure: ROBOTIC ASSISTED TOTAL HYSTERECTOMY WITH BILATERAL SALPINGO OOPHORECTOMY;  Surgeon: Everitt Amber, MD;  Location: [facility];  Service: Gynecology;  Laterality: Bilateral;  . WISDOM TOOTH EXTRACTION      There were no vitals filed for this visit.  Subjective Assessment - 08/13/17 0936    Subjective  I still have my cough but not have a cold.  I am traveling again.  I stil am dribbling and it continously doing better.  I am still using the egg for exercise.  The intercourse is stil painful.  I am still using my therawand.  Pain is at entrance and deeper and husband not able to go in deeper.  My lubrication is better.     Pertinent History  ovarian cancer with complete hysterectomy 09/01/16 and SLNB, completed chemotherapy on 02/23/17    Patient Stated Goals  to improve LE strength    Currently in Pain?  Yes    Pain Score  3     Pain Location  Abdomen    Pain Orientation  Lower    Pain Descriptors / Indicators  Aching    Pain Type  Chronic pain    Pain Onset  More than a month ago    Pain Frequency  Intermittent    Aggravating Factors  intercourse    Pain Relieving Factors  no intercourse    Multiple Pain Sites  Yes    Pain Score  5    Pain Location  Vagina    Pain Orientation  Mid    Pain Descriptors / Indicators  Aching;Shooting    Pain Type  Chronic pain    Pain Onset  More than a month ago    Pain Frequency  Intermittent    Aggravating Factors   intercourse    Pain Relieving Factors  no intercourse                    Pelvic Floor Special Questions - 08/13/17 0001    Pelvic Floor Internal Exam  Patient confirms indentification and approves PT to assess pelvic floor integrity and treatment    Exam Type  Vaginal;Rectal both at same time to release between anal and vaginal canal    Strength  good squeeze, good lift, able to hold agaisnt strong resistance but troule isolating the vaginal from the anal contraction        [facility] Adult PT Treatment/Exercise -  08/13/17 0001      Manual Therapy   Manual Therapy  Internal Pelvic Floor    Internal Pelvic Floor  one finger in the introitus and rectum to release the tissue between the anus and vaginal area, along the pelvic wall anteriorly and posteriorly in left sidely             PT Education - 08/13/17 1013    Education provided  Yes    Education Details  information on using dilator and self stretch    Person(s) Educated  Patient    Methods  Explanation;Demonstration;Verbal cues;Handout    Comprehension  Verbalized understanding;Returned demonstration       PT Short Term Goals - 05/24/17 1054      PT SHORT TERM GOAL #5   Title  ability to have penile penetration with her husband with pain decreased >/= 20% better     Time  8    Period  Weeks    Status  Achieved        PT Long Term Goals - 08/13/17 1021      PT LONG TERM GOAL #6   Title  urinary leakage decreased >/= 75% so she is using 1 light pad per day due to increased pelvic floor strength    Baseline  2 pads    Time  8    Period  Weeks    Status  On-going      PT LONG TERM GOAL #7   Title  able to have penile penetration with >/= 50% decreased in pain    Baseline  trouble with deep penetration    Time  8    Period  Weeks    Status  On-going      PT LONG TERM GOAL #8   Title  Deep ache in the pelvis has decreased >/= 75% due to improved tissue mobility    Baseline  pain level more consistent with 1/10 and 80% better    Time  8    Period  Weeks    Status  Achieved            Plan - 08/13/17 1017    Clinical Impression Statement  Patient has pain with intercourse at the entrance and deeper.  Patient husband is only able to get his penis in the vaginal canal halfway. Patient understand how to use  a device to stretch the vaginal  canal.  Patient is no on 2 pads instead of 8 pads.  Patient is not able to isolate the vaginal contraction from the anal contraction. Patient is will benefit from skilled therapy to  work on the urinary leakage and deep penetrative pain.     Rehab Potential  Excellent    Clinical Impairments Affecting Rehab Potential  pt with increased left knee and right shoulder pain    PT Frequency  2x / week    PT Duration  4 weeks    PT Treatment/Interventions  ADLs/Self Care Home Management;Electrical Stimulation;Neuromuscular re-education;Therapeutic exercise;Balance training;Manual techniques;Patient/family education;Therapeutic activities;Dry needling    PT Next Visit Plan   internal soft tissue work to work on achiness;  pelvic floor strength with movement; sitting with fingers on the perineaum and coccy going from sit to stand and back    PT Home Exercise Plan  progress as needed    Consulted and Agree with Plan of Care  Patient       Patient will benefit from skilled therapeutic intervention in order to improve the following deficits and impairments:  Decreased balance, Decreased endurance, Decreased mobility, Difficulty walking, Impaired sensation, Decreased strength, Pain, Postural dysfunction, Increased edema, Increased muscle spasms, Increased fascial restricitons  Visit Diagnosis: Other muscle spasm  Unspecified lack of coordination  Muscle weakness (generalized)     Problem List Patient Active Problem List   Diagnosis Date Noted  . Physical deconditioning 03/25/2017  . Obesity (BMI 30.0-34.9) 03/25/2017  . Drug-induced hyperglycemia 12/23/2016  . Port catheter in place 12/02/2016  . Dysuria 12/02/2016  . 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 Acute rhinitis 11/04/2016  . Goals of care, counseling/discussion 11/04/2016  . 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 Endometrial cancer ([place]) 08/24/2016    Earlie Counts, PT 08/13/17 10:23 AM   [facility] [address], [place], [place], [address] Phone: [phone]   Fax:  [phone]  Name:  MARLEY PAKULA MRN: [id number] Date of Birth: 1965-10-20

## 2017-08-13 NOTE — Patient Instructions (Addendum)
PROTOCOL FOR DILATORS   1. Wash dilator with soap and water prior to insertion.    2. Lay on your back reclined. Knees are to be up and apart while on your bed or in the bathtub with warm water.   3. Lubricate the end of the dilator with a water-soluble lubricant.  4. Separate the labia.   5. Tense the pelvic floor muscles than relax; while relaxing, slide lubricated dilator into the vagina.    6. Tense muscles again while holding the dilator so it does not get pushed out; relax and slide it in a little further.   7. Try blowing out as if filling a balloon; this may relax the muscles and allow penetration.  Repeat blowing out to insert dilator further.  8. Keep dilator in for 10 minutes if tolerate, with the pelvic floor muscles relaxed to further stretch the canal.   9. Never force the dilator into the canal.  10. 1-2 times per day  STRETCHING THE PELVIC FLOOR MUSCLES NO DILATOR  Supplies . Vaginal lubricant . Mirror (optional) . Gloves (optional) Positioning . Start in a semi-reclined position with your head propped up. Bend your knees and place your thumb or finger at the vaginal opening. Procedure . Apply a moderate amount of lubricant on the outer skin of your vagina, the labia minora.  Apply additional lubricant to your finger. Marland Kitchen Spread the skin away from the vaginal opening. Place the end of your finger at the opening. . Do a maximum contraction of the pelvic floor muscles. Tighten the vagina and the anus maximally and relax. . When you know they are relaxed, gently and slowly insert your finger into your vagina, directing your finger slightly downward, for 2-3 inches of insertion. . Relax and stretch the 6 o'clock position . Hold each stretch for _2 min__ and repeat __1_ time with rest breaks of _1__ seconds between each stretch. . Repeat the stretching in the 4 o'clock and 8 o'clock positions. . Total time should be _6__ minutes, _1__ x per day.  Note the amount of  theme your were able to achieve and your tolerance to your finger in your vagina. . Once you have accomplished the techniques you may try them in standing with one foot resting on the tub, or in other positions.  This is a good stretch to do in the shower if you don't need to use lubricant.  [facility] [address], [place], [place] [address] Phone # [phone] Fax [phone]

## 2017-08-14 DIAGNOSIS — J329 Chronic sinusitis, unspecified: Secondary | ICD-10-CM

## 2017-08-14 HISTORY — DX: Chronic sinusitis, unspecified: J32.9

## 2017-08-16 ENCOUNTER — Encounter: Payer: BLUE CROSS/BLUE SHIELD | Admitting: Physical Therapy

## 2017-09-01 ENCOUNTER — Encounter: Payer: BLUE CROSS/BLUE SHIELD | Admitting: Physical Therapy

## 2017-09-02 ENCOUNTER — Encounter: Payer: BLUE CROSS/BLUE SHIELD | Admitting: Physical Therapy

## 2017-09-09 ENCOUNTER — Encounter: Payer: Self-pay | Admitting: Physical Therapy

## 2017-09-09 ENCOUNTER — Ambulatory Visit: Payer: BLUE CROSS/BLUE SHIELD | Attending: Hematology and Oncology | Admitting: Physical Therapy

## 2017-09-09 DIAGNOSIS — M6281 Muscle weakness (generalized): Secondary | ICD-10-CM | POA: Diagnosis present

## 2017-09-09 DIAGNOSIS — R279 Unspecified lack of coordination: Secondary | ICD-10-CM | POA: Insufficient documentation

## 2017-09-09 DIAGNOSIS — M62838 Other muscle spasm: Secondary | ICD-10-CM | POA: Diagnosis present

## 2017-09-09 NOTE — Therapy (Signed)
[facility] [address], [place], [place], [address] Phone: [phone]   Fax:  [phone]  Physical Therapy Treatment  Patient Details  Name: Danielle Guzman MRN: [id number] Date of Birth: [birth year] Referring Provider: Dr. Alvy Bimler   Encounter Date: 09/09/2017  PT End of Session - 09/09/17 1648    Visit Number  40    Date for PT Re-Evaluation  10/11/17    Authorization Type  BCBS/ coinsurance 30 visit limit combine with PT/OT/Chiropractor    Authorization Time Period  insurance will not pay for more visits. Patient is paying out of pocket    Authorization - Visit Number  40    Authorization - Number of Visits  30    PT Start Time  6962    PT Stop Time  1230    PT Time Calculation (min)  45 min    Activity Tolerance  Patient tolerated treatment well    Behavior During Therapy  WFL for tasks assessed/performed       Past Medical History:  Diagnosis Date  . Cancer ([place]) 2018   endometrial   . Headache    hx migraines yrs ago  . Malaria as child  . Miscarriage    x 2  . Pneumonia yrs ago  . Seizures ([place])    petite mal seizure in high school x 1, none since    Past Surgical History:  Procedure Laterality Date  . DENTAL SURGERY     gum graft 20 yrs ago and again on 08/26/16  . DILATATION & CURETTAGE/HYSTEROSCOPY WITH MYOSURE N/A 08/11/2016   Procedure: DILATATION & CURETTAGE/HYSTEROSCOPY WITH MYOSURE;  Surgeon: Jerelyn Charles, MD;  Location: [facility];  Service: Gynecology;  Laterality: N/A;  polyp removal  . IR FLUORO GUIDE PORT INSERTION RIGHT  11/10/2016  . IR REMOVAL TUN ACCESS W/ PORT W/O FL MOD SED  03/11/2017  . IR US GUIDE VASC ACCESS RIGHT  11/10/2016  . LYMPH NODE BIOPSY N/A 09/01/2016   Procedure: SENTINEL LYMPH NODE BIOPSY;  Surgeon: Everitt Amber, MD;  Location: [facility];  Service: Gynecology;  Laterality: N/A;  . ROBOTIC ASSISTED TOTAL HYSTERECTOMY WITH BILATERAL SALPINGO OOPHERECTOMY Bilateral 09/01/2016    Procedure: ROBOTIC ASSISTED TOTAL HYSTERECTOMY WITH BILATERAL SALPINGO OOPHORECTOMY;  Surgeon: Everitt Amber, MD;  Location: [facility];  Service: Gynecology;  Laterality: Bilateral;  . WISDOM TOOTH EXTRACTION      There were no vitals filed for this visit.  Subjective Assessment - 09/09/17 1150    Subjective  Pelvic pain is 1/10 or below. I still have pain with intercourse.  I still use 2 pads per day. I do not fill up the pads completely. I am still using the eggs for pelvic floor contraction. I broke my toe while in [place] and messed up my left toe.     Pertinent History  ovarian cancer with complete hysterectomy 09/01/16 and SLNB, completed chemotherapy on 02/23/17    Patient Stated Goals  to improve LE strength    Currently in Pain?  Yes    Pain Score  1     Pain Location  Abdomen    Pain Orientation  Lower    Pain Descriptors / Indicators  Aching    Pain Type  Chronic pain    Pain Onset  More than a month ago    Pain Frequency  Intermittent    Aggravating Factors   intercourse after husband is one inch into the vagina    Pain Relieving Factors  no intercourse    Multiple Pain Sites  No                    Pelvic Floor Special Questions - 09/09/17 0001    Pelvic Floor Internal Exam  Patient confirms indentification and approves PT to assess pelvic floor integrity and treatment    Exam Type  Vaginal    Strength  fair squeeze, definite lift 4/5 sometimes        [facility] Adult PT Treatment/Exercise - 09/09/17 0001      Neuro Re-ed    Neuro Re-ed Details   pelvic floor contraction with guidance from tactile cues to nod the clitoris      Manual Therapy   Manual Therapy  Internal Pelvic Floor    Internal Pelvic Floor  bil. levator ani, obturator internist, spincter muscles while working on trigger points               PT Short Term Goals - 05/24/17 1054      PT SHORT TERM GOAL #5   Title  ability to have penile penetration with her husband with pain decreased  >/= 20% better     Time  8    Period  Weeks    Status  Achieved        PT Long Term Goals - 08/13/17 1021      PT LONG TERM GOAL #6   Title  urinary leakage decreased >/= 75% so she is using 1 light pad per day due to increased pelvic floor strength    Baseline  2 pads    Time  8    Period  Weeks    Status  On-going      PT LONG TERM GOAL #7   Title  able to have penile penetration with >/= 50% decreased in pain    Baseline  trouble with deep penetration    Time  8    Period  Weeks    Status  On-going      PT LONG TERM GOAL #8   Title  Deep ache in the pelvis has decreased >/= 75% due to improved tissue mobility    Baseline  pain level more consistent with 1/10 and 80% better    Time  8    Period  Weeks    Status  Achieved            Plan - 09/09/17 1649    Clinical Impression Statement  Patient wears 2 pads per day that are not wet.  Patient is leaking less leakage.  Patient still has pain with intercourse and penis is not able to enter 1 inch. Patient has trouble with anterior contraction and needs assistance to cue the body. Patient will benefit from skilled therapy to work on the urinary leakage and deep penetrative pain.     Rehab Potential  Excellent    Clinical Impairments Affecting Rehab Potential  pt with increased left knee and right shoulder pain    PT Frequency  2x / week    PT Duration  4 weeks    PT Treatment/Interventions  ADLs/Self Care Home Management;Electrical Stimulation;Neuromuscular re-education;Therapeutic exercise;Balance training;Manual techniques;Patient/family education;Therapeutic activities;Dry needling    PT Next Visit Plan   internal soft tissue work to work on achiness;  pelvic floor strength with movement; sitting with fingers on the perineaum and coccy going from sit to stand and back    PT Home Exercise Plan  progress as needed  Consulted and Agree with Plan of Care  Patient       Patient will benefit from skilled therapeutic  intervention in order to improve the following deficits and impairments:  Decreased balance, Decreased endurance, Decreased mobility, Difficulty walking, Impaired sensation, Decreased strength, Pain, Postural dysfunction, Increased edema, Increased muscle spasms, Increased fascial restricitons  Visit Diagnosis: Other muscle spasm  Unspecified lack of coordination  Muscle weakness (generalized)     Problem List Patient Active Problem List   Diagnosis Date Noted  . Physical deconditioning 03/25/2017  . Obesity (BMI 30.0-34.9) 03/25/2017  . Drug-induced hyperglycemia 12/23/2016  . Port catheter in place 12/02/2016  . Dysuria 12/02/2016  . 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 Acute rhinitis 11/04/2016  . Goals of care, counseling/discussion 11/04/2016  . 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 Endometrial cancer ([place]) 08/24/2016   Earlie Counts, PT 09/09/17 4:55 PM   [facility] [address] [place], [place], [address] Phone: [phone]   Fax:  [phone]  Name: ROSALEEN MAZER MRN: [id number] Date of Birth: 1966-02-24

## 2017-09-13 ENCOUNTER — Encounter: Payer: BLUE CROSS/BLUE SHIELD | Admitting: Physical Therapy

## 2017-09-22 ENCOUNTER — Telehealth: Payer: Self-pay | Admitting: Oncology

## 2017-09-22 ENCOUNTER — Encounter: Payer: Self-pay | Admitting: Gynecologic Oncology

## 2017-09-22 ENCOUNTER — Inpatient Hospital Stay: Payer: BLUE CROSS/BLUE SHIELD | Attending: Hematology and Oncology | Admitting: Gynecologic Oncology

## 2017-09-22 ENCOUNTER — Encounter: Payer: Self-pay | Admitting: Physical Therapy

## 2017-09-22 ENCOUNTER — Ambulatory Visit: Payer: BLUE CROSS/BLUE SHIELD | Attending: Hematology and Oncology | Admitting: Physical Therapy

## 2017-09-22 VITALS — BP 129/82 | HR 67 | Temp 98.4°F | Resp 20 | Ht 66.0 in | Wt 209.4 lb

## 2017-09-22 DIAGNOSIS — Z90710 Acquired absence of both cervix and uterus: Secondary | ICD-10-CM

## 2017-09-22 DIAGNOSIS — R279 Unspecified lack of coordination: Secondary | ICD-10-CM

## 2017-09-22 DIAGNOSIS — M6281 Muscle weakness (generalized): Secondary | ICD-10-CM | POA: Insufficient documentation

## 2017-09-22 DIAGNOSIS — C541 Malignant neoplasm of endometrium: Secondary | ICD-10-CM

## 2017-09-22 DIAGNOSIS — Z8542 Personal history of malignant neoplasm of other parts of uterus: Secondary | ICD-10-CM | POA: Insufficient documentation

## 2017-09-22 DIAGNOSIS — Z9221 Personal history of antineoplastic chemotherapy: Secondary | ICD-10-CM | POA: Insufficient documentation

## 2017-09-22 DIAGNOSIS — Z90722 Acquired absence of ovaries, bilateral: Secondary | ICD-10-CM | POA: Insufficient documentation

## 2017-09-22 DIAGNOSIS — Z87891 Personal history of nicotine dependence: Secondary | ICD-10-CM

## 2017-09-22 DIAGNOSIS — M62838 Other muscle spasm: Secondary | ICD-10-CM | POA: Diagnosis not present

## 2017-09-22 MED FILL — FUROSEMIDE 20 MG TABS: 20 | 30 days supply | Qty: 30 | Fill #1

## 2017-09-22 NOTE — Telephone Encounter (Signed)
Left a message for patient regarding genetic testing.  Requested a return call.

## 2017-09-22 NOTE — Patient Instructions (Signed)
Please notify Dr Denman George at phone number [phone] if you notice vaginal bleeding, new pelvic or abdominal pains, bloating, feeling full easy, or a change in bladder or bowel function.   Please notify Dr Serita Grit office if you feel that you would like a CT scan to be ordered.  Please return to see Dr Denman George in 3 months as scheduled.

## 2017-09-22 NOTE — Progress Notes (Signed)
Follow-up Note: Gyn-Onc  Consult was requested by Dr. Loletta Specter and Dr Stann Mainland for the evaluation of Danielle Guzman 52 y.o. female  CC:  Chief Complaint  Patient presents with  . Endometrial cancer [facility])    Assessment/Plan:  Danielle Guzman  is a 52 y.o.  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 MSI unstable.   She is s/p staging surgery on 09/01/16. And s/p adjuvat carboplatin and paclitaxel x 6 cycles between 11/12/2016 - 02/23/17.   She has some generalized LE edema (not consistent with lymphedema). Will try 20 lasix prn daily.  No evidence of recurrence on exam. Continue 3 monthly surveillance visits until December, 2020. Patient has concerns regarding recurrence - will order a CT abd/pelvis if these escalate.   Will continue to revisit genetics consultation given loss of MSH6 on IHC.   HPI: Danielle Guzman is a 53 year old [id number] who is seen in consultation at the request of Dr Stann Mainland and Dr Jerelyn Charles for endometrial cancer.  The patient reports early menopause at age 32 (all of her female relatives have had early menopause). She began experiencing postmenopausal bleeding in March 2018. She was seen by Dr Stann Mainland on 06/02/16 and a TVUS showed an endometrium of 1.8cm with a 3cm hypervascular polyp.  She was taken to the OR on 08/11/16 for a hysteroscopy/D&C with Dr Jerelyn Charles and the specimen showed endometrial adenocarcinoma with the majority of the specimen revealing grade 2 endometrioid endometrial cancer with a small focus (5%) of clear cell carcinoma and a few foci suspicious for serous carcinoma.  She is otherwise very healthy. She has had no prior surgeries or vaginal deliveries.  Her mother has a history of breast cancer x 3, she has a sister with a history of cervical cancer. Her maternal aunt has a history of breast cancer. A different maternal aunt had uterine and colon cancer. Her maternal cousin had uterine cancer. Her  paternal grandmother had a diagnosis of colon cancer and her paternal cousins had hysterectomies for "abnromal cells" but no cancer.  Interval Hx: On 09/01/16 she underwent robotic assisted total hysterectomy, BSO, SLN biopsy. Surgery was uncomplicated. The final pathology revealed a 1.8cm grade 2 endometrioid endometrial cancer ( with rare foci of squamous differentiation and a rare microscopic focus of serous differentiation). There was luminal foci of carcinoma within the right and left fallopian tubes and the right fallopian tube lumen involvement showed a focus of similar appearing chondroid stroma with adjacent focus of serous carcinoma.  The lymph nodes and cervix were negative for carcinoma. The uterine tumor showed no myometrial invasion and no LVSI. Final pathology revealed loss of MSH6 on IHC.   Declined genetics consultation.  Postoperatively she completed 6 cycles of carboplatin and paclitaxel between 11/12/17-12/11/9 after experiencing some trepidation regarding toxicity overwhelming benefits.   She has some persistent foot neuropathy post-chemotherapy and has some bilateral lower extremity edema. She see PT for neuropathy and pelvic pains.  Her friend recently died from advanced uterine cancer.  Current Meds:  Outpatient Encounter Medications as of 09/22/2017  Medication Sig  . Ascorbic Acid (VITAMIN C PO) Take 1 tablet by mouth 3 (three) times daily as needed (for immune health support or cold symptoms.).  Marland Kitchen b complex vitamins tablet Take 1 tablet by mouth daily.  . furosemide (LASIX) 20 MG tablet Take 1 tablet (20 mg total) by mouth daily as needed.  . Omega-3 Fatty Acids (FISH OIL)  1000 MG CAPS Take by mouth.  . Probiotic Product (PROBIOTIC DAILY PO) Take 1 capsule by mouth daily.  . Vitamin D, Ergocalciferol, (DRISDOL) 50000 units CAPS capsule Take 50,000 Units by mouth every 7 (seven) days.  . [DISCONTINUED] doxycycline (VIBRA-TABS) 100 MG tablet Take 100 mg by mouth 2  (two) times daily.   No facility-administered encounter medications on file as of 09/22/2017.     Allergy:  Allergies  Allergen Reactions  . Latex     Long exposure causes a rash  . Tamiflu  [Oseltamivir Phosphate] Nausea Only    Social Hx:   Social History   Socioeconomic History  . Marital status: Married    Spouse name: [name]  . Number of children: 0  . Years of education: Not on file  . Highest education level: Not on file  Occupational History  . Occupation: massage therapist  Social Needs  . Financial resource strain: Not on file  . Food insecurity:    Worry: Not on file    Inability: Not on file  . Transportation needs:    Medical: Not on file    Non-medical: Not on file  Tobacco Use  . Smoking status: Former Smoker    Packs/day: 1.50    Years: 8.00    Pack years: 12.00    Types: Cigarettes  . Smokeless tobacco: Never Used  . Tobacco comment: quit smoking at age 32  Substance and Sexual Activity  . Alcohol use: Yes    Alcohol/week: 2.4 oz    Types: 4 Glasses of wine per week    Comment: wine occ  . Drug use: No  . Sexual activity: Yes    Birth control/protection: Post-menopausal  Lifestyle  . Physical activity:    Days per week: Not on file    Minutes per session: Not on file  . Stress: Not on file  Relationships  . Social connections:    Talks on phone: Not on file    Gets together: Not on file    Attends religious service: Not on file    Active member of club or organization: Not on file    Attends meetings of clubs or organizations: Not on file    Relationship status: Not on file  . Intimate partner violence:    Fear of current or ex partner: Not on file    Emotionally abused: Not on file    Physically abused: Not on file    Forced sexual activity: Not on file  Other Topics Concern  . Not on file  Social History Narrative  . Not on file    Past Surgical Hx:  Past Surgical History:  Procedure Laterality Date  . DENTAL  SURGERY     gum graft 20 yrs ago and again on 08/26/16  . DILATATION & CURETTAGE/HYSTEROSCOPY WITH MYOSURE N/A 08/11/2016   Procedure: DILATATION & CURETTAGE/HYSTEROSCOPY WITH MYOSURE;  Surgeon: Jerelyn Charles, MD;  Location: [facility];  Service: Gynecology;  Laterality: N/A;  polyp removal  . IR FLUORO GUIDE PORT INSERTION RIGHT  11/10/2016  . IR REMOVAL TUN ACCESS W/ PORT W/O FL MOD SED  03/11/2017  . IR US GUIDE VASC ACCESS RIGHT  11/10/2016  . LYMPH NODE BIOPSY N/A 09/01/2016   Procedure: SENTINEL LYMPH NODE BIOPSY;  Surgeon: Everitt Amber, MD;  Location: [facility];  Service: Gynecology;  Laterality: N/A;  . ROBOTIC ASSISTED TOTAL HYSTERECTOMY WITH BILATERAL SALPINGO OOPHERECTOMY Bilateral 09/01/2016   Procedure: ROBOTIC ASSISTED TOTAL HYSTERECTOMY WITH BILATERAL SALPINGO OOPHORECTOMY;  Surgeon: Everitt Amber, MD;  Location: [facility];  Service: Gynecology;  Laterality: Bilateral;  . WISDOM TOOTH EXTRACTION      Past Medical Hx:  Past Medical History:  Diagnosis Date  . Cancer ([place]) 2018   endometrial   . Headache    hx migraines yrs ago  . Malaria as child  . Miscarriage    x 2  . Pneumonia yrs ago  . Seizures ([place])    petite mal seizure in high school x 1, none since    Past Gynecological History:  Premature menopause No LMP recorded. Patient has had a hysterectomy.  Family Hx:  Family History  Problem Relation Age of Onset  . Cancer Mother   . Cancer Sister        Cervix  . Cancer Maternal Aunt        Breast, uterine  . Cancer Paternal Grandmother        Colon    Review of Systems:  Constitutional  Feels well,    ENT Normal appearing ears and nares bilaterally Skin/Breast  No rash, sores, jaundice, itching, dryness Cardiovascular  No chest pain, shortness of breath, or edema  Pulmonary  No cough or wheeze.  Gastro Intestinal  No nausea, vomitting, or diarrhoea. No bright red blood per rectum, no abdominal pain, change in bowel movement, or constipation.  Genito Urinary   No frequency, urgency, dysuria, no bleeding Musculo Skeletal  + bilateral LE edema Neurologic  + foot neuropathy bilaterally  Psychology  No depression, anxiety, insomnia.   Vitals:  Blood pressure 129/82, pulse 67, temperature 98.4 F (36.9 C), temperature source Oral, resp. rate 20, height _0  (1.676 m), weight 209 lb 6.4 oz (95 kg), SpO2 100 %.  Physical Exam: WD in NAD Neck  Supple NROM, without any enlargements.  Lymph Node Survey No cervical supraclavicular or inguinal adenopathy Cardiovascular  Pulse normal rate, regularity and rhythm. S1 and S2 normal.  Lungs  Clear to auscultation bilateraly, without wheezes/crackles/rhonchi. Good air movement.  Skin  No rash/lesions/breakdown  Psychiatry  Alert and oriented to person, place, and time  Abdomen  Normoactive bowel sounds, abdomen soft, non-tender and obese without evidence of hernia. Soft incisions.  Back No CVA tenderness Genito Urinary  : vaginal cuff with 2 small fronds of granulation tissue. No other lesions or masses.  Rectal  deferred Extremities  No bilateral cyanosis, clubbing or edema.  Thereasa Solo, MD  09/22/2017, 2:21 PM

## 2017-09-22 NOTE — Therapy (Signed)
[facility] [address], [place], [place], [address] Phone: [phone]   Fax:  [phone]  Physical Therapy Treatment  Patient Details  Name: Danielle Guzman MRN: [id number] Date of Birth: 1965/11/17 Referring Provider: Dr. Alvy Bimler   Encounter Date: 09/22/2017  PT End of Session - 09/22/17 1542    Visit Number  41    Date for PT Re-Evaluation  10/11/17    Authorization Type  BCBS/ coinsurance 30 visit limit combine with PT/OT/Chiropractor    Authorization Time Period  insurance will not pay for more visits. Patient is paying out of pocket    Authorization - Visit Number  40    Authorization - Number of Visits  30    PT Start Time  4166    PT Stop Time  1525    PT Time Calculation (min)  40 min    Activity Tolerance  Patient tolerated treatment well    Behavior During Therapy  WFL for tasks assessed/performed       Past Medical History:  Diagnosis Date  . Cancer ([place]) 2018   endometrial   . Headache    hx migraines yrs ago  . Malaria as child  . Miscarriage    x 2  . Pneumonia yrs ago  . Seizures ([place])    petite mal seizure in high school x 1, none since    Past Surgical History:  Procedure Laterality Date  . DENTAL SURGERY     gum graft 20 yrs ago and again on 08/26/16  . DILATATION & CURETTAGE/HYSTEROSCOPY WITH MYOSURE N/A 08/11/2016   Procedure: DILATATION & CURETTAGE/HYSTEROSCOPY WITH MYOSURE;  Surgeon: Jerelyn Charles, MD;  Location: [facility];  Service: Gynecology;  Laterality: N/A;  polyp removal  . IR FLUORO GUIDE PORT INSERTION RIGHT  11/10/2016  . IR REMOVAL TUN ACCESS W/ PORT W/O FL MOD SED  03/11/2017  . IR US GUIDE VASC ACCESS RIGHT  11/10/2016  . LYMPH NODE BIOPSY N/A 09/01/2016   Procedure: SENTINEL LYMPH NODE BIOPSY;  Surgeon: Everitt Amber, MD;  [facility]: [facility];  Service: Gynecology;  Laterality: N/A;  . ROBOTIC ASSISTED TOTAL HYSTERECTOMY WITH BILATERAL SALPINGO OOPHERECTOMY Bilateral 09/01/2016    Procedure: ROBOTIC ASSISTED TOTAL HYSTERECTOMY WITH BILATERAL SALPINGO OOPHORECTOMY;  Surgeon: Everitt Amber, MD;  Location: [facility];  Service: Gynecology;  Laterality: Bilateral;  . WISDOM TOOTH EXTRACTION      There were no vitals filed for this visit.  Subjective Assessment - 09/22/17 1536    Subjective  Patient saw her oncologist and they said everything is okay.  They talked about ordering a CAT scan. I have a gut feeling that something may be going on. The leakage is better. The pads are 35% dryer from last visit. I noticed the leakage especially when I am up and down.     Pertinent History  ovarian cancer with complete hysterectomy 09/01/16 and SLNB, completed chemotherapy on 02/23/17    Patient Stated Goals  to improve LE strength    Currently in Pain?  Yes    Pain Score  1     Pain Location  Abdomen    Pain Orientation  Lower    Pain Descriptors / Indicators  Aching    Pain Type  Chronic pain    Pain Onset  More than a month ago    Pain Frequency  Intermittent    Aggravating Factors   intercourse after husband is one inch into the vagina    Pain Relieving  Factors  no intercourse    Multiple Pain Sites  No                       [facility] Adult PT Treatment/Exercise - 09/22/17 0001      Neuro Re-ed    Neuro Re-ed Details   sit to stand and back with tactile cues to pelvic floor and abdominals      Knee/Hip Exercises: Standing   Other Standing Knee Exercises  contract pelvic floor going from sit to stand; lean over a mat with isolating pelvic floor muscles with contraction      Manual Therapy   Manual Therapy  Soft tissue mobilization;Internal Pelvic Floor    Soft tissue mobilization  sit to stand with trigger point work to coccygeus and levator ani to improve pelvic floor contraction    Internal Pelvic Floor  along bilatearl bulbocavernsus, left side of urethra spincter               PT Short Term Goals - 05/24/17 1054      PT SHORT TERM GOAL #5    Title  ability to have penile penetration with her husband with pain decreased >/= 20% better     Time  8    Period  Weeks    Status  Achieved        PT Long Term Goals - 08/13/17 1021      PT LONG TERM GOAL #6   Title  urinary leakage decreased >/= 75% so she is using 1 light pad per day due to increased pelvic floor strength    Baseline  2 pads    Time  8    Period  Weeks    Status  On-going      PT LONG TERM GOAL #7   Title  able to have penile penetration with >/= 50% decreased in pain    Baseline  trouble with deep penetration    Time  8    Period  Weeks    Status  On-going      PT LONG TERM GOAL #8   Title  Deep ache in the pelvis has decreased >/= 75% due to improved tissue mobility    Baseline  pain level more consistent with 1/10 and 80% better    Time  8    Period  Weeks    Status  Achieved            Plan - 09/22/17 1542    Clinical Impression Statement  Patient reports her pads are 35% dryer from last visit.  Patient will leak urine going from sit to stand and back. Patient has pain in left urethra shincte area.  Patient has not had intercourse to see if better.  Patient able to contract better with tactile work to the coccygeus with sit to stand. Patient will benefit will benefit from skilled therapy to work on the urinary leakage and deep penetrative pain.     Rehab Potential  Excellent    Clinical Impairments Affecting Rehab Potential  pt with increased left knee and right shoulder pain    PT Frequency  2x / week    PT Duration  4 weeks    PT Treatment/Interventions  ADLs/Self Care Home Management;Electrical Stimulation;Neuromuscular re-education;Therapeutic exercise;Balance training;Manual techniques;Patient/family education;Therapeutic activities;Dry needling    PT Next Visit Plan   internal soft tissue work to work on achiness;  pelvic floor strength with movement; sitting with fingers on the perineaum and  coccy going from sit to stand and back ;dry  needling to bulbocavernosus; soft tissue work to left urethra sphincter are    PT Home Exercise Plan  progress as needed    Consulted and Agree with Plan of Care  Patient       Patient will benefit from skilled therapeutic intervention in order to improve the following deficits and impairments:  Decreased balance, Decreased endurance, Decreased mobility, Difficulty walking, Impaired sensation, Decreased strength, Pain, Postural dysfunction, Increased edema, Increased muscle spasms, Increased fascial restricitons  Visit Diagnosis: Other muscle spasm  Unspecified lack of coordination  Muscle weakness (generalized)     Problem List Patient Active Problem List   Diagnosis Date Noted  . Physical deconditioning 03/25/2017  . Obesity (BMI 30.0-34.9) 03/25/2017  . Drug-induced hyperglycemia 12/23/2016  . Port catheter in place 12/02/2016  . Dysuria 12/02/2016  . 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 Acute rhinitis 11/04/2016  . Goals of care, counseling/discussion 11/04/2016  . 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 Endometrial cancer ([place]) 08/24/2016    Earlie Counts, PT 09/22/17 4:15 PM   [facility] [address], [place], [place], [address] Phone: [phone]   Fax:  [phone]  Name: Danielle Guzman MRN: [id number] Date of Birth: [birth year]

## 2017-09-23 NOTE — Telephone Encounter (Signed)
Danielle Guzman called back and said she does not want genetic testing at this time.  She said she her mother's twin sister had genetic testing and it was abnormal (she does not remember what was abnormal) so she does not see the benefit in having the testing done for herself.  Discussed why genetic testing is done and discussed potential benefits.  She said for now she does not want the testing but will call back if she changes her mind.

## 2017-09-24 ENCOUNTER — Ambulatory Visit: Payer: BLUE CROSS/BLUE SHIELD | Admitting: Physical Therapy

## 2017-09-28 ENCOUNTER — Telehealth: Payer: Self-pay | Admitting: *Deleted

## 2017-09-28 NOTE — Telephone Encounter (Signed)
Attempted to return the patient's call. Left a message for the patient to call the office back

## 2017-10-11 ENCOUNTER — Ambulatory Visit: Payer: BLUE CROSS/BLUE SHIELD | Admitting: Physical Therapy

## 2017-10-11 ENCOUNTER — Encounter: Payer: Self-pay | Admitting: Physical Therapy

## 2017-10-11 DIAGNOSIS — R279 Unspecified lack of coordination: Secondary | ICD-10-CM

## 2017-10-11 DIAGNOSIS — M62838 Other muscle spasm: Secondary | ICD-10-CM

## 2017-10-11 DIAGNOSIS — M6281 Muscle weakness (generalized): Secondary | ICD-10-CM

## 2017-10-11 NOTE — Therapy (Signed)
[facility] [address] [place], [place], [address] Phone: [phone]   Fax:  [phone]  Physical Therapy Treatment  Patient Details  Name: Danielle Guzman MRN: [id number] Date of Birth: October 14, 1965 Referring Provider: Dr. Alvy Bimler   Encounter Date: 10/11/2017  PT End of Session - 10/11/17 0813    Visit Number  42    Date for PT Re-Evaluation  02/11/18    Authorization Type  --    PT Start Time  0810 came late    PT Stop Time  0845    PT Time Calculation (min)  35 min    Activity Tolerance  Patient tolerated treatment well    Behavior During Therapy  [facility] for tasks assessed/performed       Past Medical History:  Diagnosis Date  . Cancer ([place]) 2018   endometrial   . Headache    hx migraines yrs ago  . Malaria as child  . Miscarriage    x 2  . Pneumonia yrs ago  . Seizures ([place])    petite mal seizure in high school x 1, none since    Past Surgical History:  Procedure Laterality Date  . DENTAL SURGERY     gum graft 20 yrs ago and again on 08/26/16  . DILATATION & CURETTAGE/HYSTEROSCOPY WITH MYOSURE N/A 08/11/2016   Procedure: DILATATION & CURETTAGE/HYSTEROSCOPY WITH MYOSURE;  Surgeon: Jerelyn Charles, MD;  Location: [facility];  Service: Gynecology;  Laterality: N/A;  polyp removal  . IR FLUORO GUIDE PORT INSERTION RIGHT  11/10/2016  . IR REMOVAL TUN ACCESS W/ PORT W/O FL MOD SED  03/11/2017  . IR US GUIDE VASC ACCESS RIGHT  11/10/2016  . LYMPH NODE BIOPSY N/A 09/01/2016   Procedure: SENTINEL LYMPH NODE BIOPSY;  Surgeon: Everitt Amber, MD;  Location: [facility];  Service: Gynecology;  Laterality: N/A;  . ROBOTIC ASSISTED TOTAL HYSTERECTOMY WITH BILATERAL SALPINGO OOPHERECTOMY Bilateral 09/01/2016   Procedure: ROBOTIC ASSISTED TOTAL HYSTERECTOMY WITH BILATERAL SALPINGO OOPHORECTOMY;  Surgeon: Everitt Amber, MD;  Location: [facility];  Service: Gynecology;  Laterality: Bilateral;  . WISDOM TOOTH EXTRACTION      There were no  vitals filed for this visit.  Subjective Assessment - 10/11/17 0810    Subjective  leakage is doing better. 1 pad during the day and night. Pad are 1/2 to 3/4 wet compared to fully wet. Sex is painful but able to go in further. Pain with intercourse in the posterior portion.  Almost able to get th ehead of the penis in.     Pertinent History  ovarian cancer with complete hysterectomy 09/01/16 and SLNB, completed chemotherapy on 02/23/17    Patient Stated Goals  to improve LE strength    Currently in Pain?  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 Alvy Bimler    Onset Date/Surgical Date  09/01/17      Precautions   Precautions  Other (comment)    Precaution Comments  at risk for LE lymphedema      Restrictions   Weight Bearing Restrictions  No      [place] residence    Living Arrangements  Spouse/significant other    Available Help at Discharge  Family    Type of [place] to enter    Entrance Stairs-Number of Steps  6      Prior Function   Level of Independence  Independent      Cognition   Overall Cognitive Status  Within Functional Limits for tasks assessed      Posture/Postural Control   Posture/Postural Control  No significant limitations      Strength   Right Hip Flexion  4/5    Right Hip ABduction  3+/5    Right Hip ADduction  3/5    Left Hip Flexion  4/5    Left Hip ABduction  3+/5    Left Hip ADduction  3/5                 Pelvic Floor Special Questions - 10/11/17 0001    Currently Sexually Active  Yes    Marinoff Scale  pain prevents any attempts at intercourse    Urinary Leakage  Yes    Pad use  2    Activities that cause leaking  -- sit to stand,    Pelvic Floor Internal Exam  Patient confirms indentification and approves PT to assess pelvic floor integrity and treatment    Exam Type  Vaginal    Palpation  burning when q-tip plaed on vestibule and increase pain    Strength  fair squeeze, definite lift pain in left buttocks    Tone  increased         [facility] Adult PT Treatment/Exercise - 10/11/17 0001      Self-Care   Self-Care  Other Self-Care Comments information on hormones and how they can affect the pelvic       Manual Therapy   Manual Therapy  Soft tissue mobilization;Internal Pelvic Floor    Soft tissue mobilization  separting the clitioral hood from the clitoris    Internal Pelvic Floor  perineal body, bulbocavernosus, levator ani       Trigger Point Dry Needling - 10/11/17 0846    Consent Given?  Yes    Muscles Treated Upper Body  -- perineal body, bil. bulboc    Muscles Treated Lower Body  -- trigger point response and elongation of tissue           PT Education - 10/11/17 1141    Education provided  Yes    Education Details  information on pod cast for hormones and pelvic floor, how to separte the clitorus from the clitoral hood    Person(s) Educated  Patient    Methods  Explanation    Comprehension  Verbalized understanding       PT Short Term Goals - 05/24/17 1054      PT SHORT TERM GOAL #5   Title  ability to have penile penetration with her husband with pain decreased >/= 20% better     Time  8    Period  Weeks    Status  Achieved        PT  Long Term Goals - 10/11/17 1145      PT LONG TERM GOAL #6   Title  urinary leakage decreased >/= 75% so she is using 1 light pad per day due to increased pelvic floor strength    Baseline  2 pads,  1 during the day and 1 at night that is 1/2-3/4 full    Time  8    Period  Weeks    Status  On-going      PT LONG TERM GOAL #7   Title  able to have penile penetration with >/= 50% decreased in pain    Baseline  unable to tolerate the full penile head into the vaginal introitus    Time  8    Period  Weeks    Status  On-going      PT LONG TERM GOAL #8   Title  Deep ache in the pelvis has decreased >/= 75% due to improved tissue mobility    Baseline  pain level more consistent with 1/10 and 80% better    Time  8    Period  Weeks    Status  Achieved            Plan - 10/11/17 0272    Clinical Impression Statement  Patient is using 2 pads, 1 at night and 1 during the day. The pads are 1/2-3/4 full.  Patient will leak urine with sit to stand and back.  Patient has burning and increased pain when q-tip is placed on the vestibule.  Patient is not able to tolerate the full head of the penis in the introitus due to pain, 8/10.  Patient clitorus is attached to the clitoral head.  Pelvic floor strength is 3/5 for 4 times.  Patient will have left buttocks pain when contracting the pelvic floor.  Deep ache in the pelvis has decreased by 80%.  Patient wil benefit from skilled therapy to work on urinary leakage and deep penetrative pain.     Rehab Potential  Excellent    Clinical Impairments Affecting Rehab Potential  pt with increased left knee and right shoulder pain    PT Frequency  Biweekly    PT Duration  Other (comment) 4 months    PT Treatment/Interventions  ADLs/Self Care Home Management;Electrical Stimulation;Neuromuscular re-education;Therapeutic exercise;Balance training;Manual techniques;Patient/family education;Therapeutic activities;Dry needling    PT Next Visit Plan   internal soft tissue work to work on achiness;  pelvic floor strength with movement; sitting with fingers on the perineaum and coccy going from sit to stand and back ;dry needling to bulbocavernosus; soft tissue work to  left urethra sphincter are    PT Home Exercise Plan  progress as needed    Recommended Other Services  renewal not sent on 10/11/2017    Consulted and Agree with Plan of Care  Patient       Patient will benefit from skilled therapeutic intervention in order to improve the following deficits and impairments:  Decreased balance, Decreased endurance, Decreased mobility, Difficulty walking, Impaired sensation, Decreased strength, Pain, Postural dysfunction, Increased edema, Increased muscle spasms, Increased fascial restricitons  Visit Diagnosis: Other muscle spasm - Plan: PT plan of care cert/re-cert  Unspecified lack of coordination - Plan: PT plan of care cert/re-cert  Muscle weakness (generalized) - Plan: PT plan of care cert/re-cert     Problem List Patient Active Problem List   Diagnosis Date Noted  . Physical deconditioning 03/25/2017  . Obesity (BMI 30.0-34.9) 03/25/2017  . Drug-induced hyperglycemia 12/23/2016  .  Port catheter in place 12/02/2016  . Dysuria 12/02/2016  . 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 Acute rhinitis 11/04/2016  . Goals of care, counseling/discussion 11/04/2016  . 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 Endometrial cancer ([place]) 08/24/2016    Earlie Counts, PT 10/11/17 11:55 AM   [facility] [address] [place], [place], [address] Phone: [phone]   Fax:  [phone]  Name: Danielle Guzman MRN: [id number] Date of Birth: 07-Jun-1965

## 2017-10-27 ENCOUNTER — Encounter: Payer: Self-pay | Admitting: Physical Therapy

## 2017-10-27 ENCOUNTER — Encounter

## 2017-10-27 ENCOUNTER — Ambulatory Visit: Payer: BLUE CROSS/BLUE SHIELD | Attending: Hematology and Oncology | Admitting: Physical Therapy

## 2017-10-27 DIAGNOSIS — M6281 Muscle weakness (generalized): Secondary | ICD-10-CM | POA: Diagnosis present

## 2017-10-27 DIAGNOSIS — M62838 Other muscle spasm: Secondary | ICD-10-CM | POA: Diagnosis present

## 2017-10-27 DIAGNOSIS — R279 Unspecified lack of coordination: Secondary | ICD-10-CM | POA: Insufficient documentation

## 2017-10-27 NOTE — Therapy (Signed)
[facility] [address] [place], [place], [address] Phone: [phone]   Fax:  [phone]  Physical Therapy Treatment  Patient Details  Name: Danielle Guzman MRN: [id number] Date of Birth: 02-22-1966 Referring Provider: Dr. Alvy Bimler   Encounter Date: 10/27/2017  PT End of Session - 10/27/17 1232    Visit Number  43    Date for PT Re-Evaluation  02/11/18    Authorization Type  self pay due to no more insurance coverage    PT Start Time  1100    PT Stop Time  1145    PT Time Calculation (min)  45 min    Activity Tolerance  Patient tolerated treatment well    Behavior During Therapy  [facility] for tasks assessed/performed       Past Medical History:  Diagnosis Date  . Cancer ([place]) 2018   endometrial   . Headache    hx migraines yrs ago  . Malaria as child  . Miscarriage    x 2  . Pneumonia yrs ago  . Seizures ([place])    petite mal seizure in high school x 1, none since    Past Surgical History:  Procedure Laterality Date  . DENTAL SURGERY     gum graft 20 yrs ago and again on 08/26/16  . DILATATION & CURETTAGE/HYSTEROSCOPY WITH MYOSURE N/A 08/11/2016   Procedure: DILATATION & CURETTAGE/HYSTEROSCOPY WITH MYOSURE;  Surgeon: Jerelyn Charles, MD;  Location: [facility];  Service: Gynecology;  Laterality: N/A;  polyp removal  . IR FLUORO GUIDE PORT INSERTION RIGHT  11/10/2016  . IR REMOVAL TUN ACCESS W/ PORT W/O FL MOD SED  03/11/2017  . IR US GUIDE VASC ACCESS RIGHT  11/10/2016  . LYMPH NODE BIOPSY N/A 09/01/2016   Procedure: SENTINEL LYMPH NODE BIOPSY;  Surgeon: Everitt Amber, MD;  Location: [facility];  Service: Gynecology;  Laterality: N/A;  . ROBOTIC ASSISTED TOTAL HYSTERECTOMY WITH BILATERAL SALPINGO OOPHERECTOMY Bilateral 09/01/2016   Procedure: ROBOTIC ASSISTED TOTAL HYSTERECTOMY WITH BILATERAL SALPINGO OOPHORECTOMY;  Surgeon: Everitt Amber, MD;  Location: [facility];  Service: Gynecology;  Laterality: Bilateral;  . WISDOM TOOTH  EXTRACTION      There were no vitals filed for this visit.  Subjective Assessment - 10/27/17 1106    Subjective  I do not feel the tearing as much.  Leakage happens more with going from sitting to standing.  When I bend in standing I have to pull up the pelvic floor. I have less pain in the clitoral hood since I have been opening the area.     Pertinent History  ovarian cancer with complete hysterectomy 09/01/16 and SLNB, completed chemotherapy on 02/23/17    Patient Stated Goals  reduce leakage    Currently in Pain?  Yes    Pain Score  6    pelvic floor 2/10   Pain Location  Vagina   clitoris   Pain Orientation  Posterior    Pain Descriptors / Indicators  Sharp    Pain Onset  More than a month ago    Pain Frequency  Intermittent    Aggravating Factors   intercourse after husband is placing penis in introitus    Pain Relieving Factors  no intercourse    Multiple Pain Sites  No                    Pelvic Floor Special Questions - 10/27/17 0001    Pelvic Floor Internal Exam  Patient confirms indentification  and approves PT to assess pelvic floor integrity and treatment    Exam Type  Vaginal    Palpation  redness at the vestibule    Strength  fair squeeze, definite lift        [facility] Adult PT Treatment/Exercise - 10/27/17 0001      Manual Therapy   Manual Therapy  Myofascial release;Internal Pelvic Floor;Soft tissue mobilization    Soft tissue mobilization  separting the clitioral hood from the clitoris    Myofascial Release  around the clitorus, along the bulbocavernosus    Internal Pelvic Floor  along the levator ani more anteriorly, along the superior introitus with 2 fingers to release the tissue,                PT Short Term Goals - 05/24/17 1054      PT SHORT TERM GOAL #5   Title  ability to have penile penetration with her husband with pain decreased >/= 20% better     Time  8    Period  Weeks    Status  Achieved        PT Long Term Goals -  10/27/17 1237      PT LONG TERM GOAL #1   Title  Pt will be independent in a home exercise program for continued strengthening and stretching    Baseline  still learning    Time  8    Period  Weeks    Status  Achieved      PT LONG TERM GOAL #6   Title  urinary leakage decreased >/= 75% so she is using 1 light pad per day due to increased pelvic floor strength    Baseline  2 pads, 1 during the day and 1 at night that is 1/2-3/4 full    Time  8    Period  Weeks    Status  On-going      PT LONG TERM GOAL #7   Title  able to have penile penetration with >/= 50% decreased in pain    Baseline  unable to tolerate the full penile head into the vaginal introitus    Time  8    Period  Weeks    Status  On-going      PT LONG TERM GOAL #8   Title  Deep ache in the pelvis has decreased >/= 75% due to improved tissue mobility    Baseline  pain level more consistent with 1/10 and 80% better    Time  8    Period  Weeks    Status  Achieved            Plan - 10/27/17 1112    Clinical Impression Statement  Patient will leak urine when she goes from sit to stand.  Patient has to be concious of pelvic floor contraction when standing and leaning over. Patient has less pain over the clitoral area since she has been performing soft tissue work to the area. Patient continues to have less pain with intercourse.  Patient vestule has erythema.  During therapy patient had increased tissue release making it softer.  Patient will benefit from skilled therapy to work on urinary leakage and deep penetrative pain.      Rehab Potential  Excellent    Clinical Impairments Affecting Rehab Potential  pt with increased left knee and right shoulder pain    PT Frequency  Biweekly    PT Duration  Other (comment)   4 months   PT  Treatment/Interventions  ADLs/Self Care Home Management;Electrical Stimulation;Neuromuscular re-education;Therapeutic exercise;Balance training;Manual techniques;Patient/family  education;Therapeutic activities;Dry needling    PT Next Visit Plan  continues with soft tissue work to the vaginal area, work on pelvic floor contraction with sit to stand    PT Home Exercise Plan  progress as needed    Recommended Other Services  MD signed all notes    Consulted and Agree with Plan of Care  Patient       Patient will benefit from skilled therapeutic intervention in order to improve the following deficits and impairments:  Decreased balance, Decreased endurance, Decreased mobility, Difficulty walking, Impaired sensation, Decreased strength, Pain, Postural dysfunction, Increased edema, Increased muscle spasms, Increased fascial restricitons  Visit Diagnosis: Other muscle spasm  Unspecified lack of coordination  Muscle weakness (generalized)     Problem List Patient Active Problem List   Diagnosis Date Noted  . Physical deconditioning 03/25/2017  . Obesity (BMI 30.0-34.9) 03/25/2017  . Drug-induced hyperglycemia 12/23/2016  . Port catheter in place 12/02/2016  . Dysuria 12/02/2016  . 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 Acute rhinitis 11/04/2016  . Goals of care, counseling/discussion 11/04/2016  . 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 Endometrial cancer ([place]) 08/24/2016    Earlie Counts, PT 10/27/17 12:39 PM   [facility] [address] [place], [place], [address] Phone: [phone]   Fax:  [phone]  Name: Danielle Guzman MRN: [id number] Date of Birth: [birth year]

## 2017-11-10 ENCOUNTER — Encounter: Payer: BLUE CROSS/BLUE SHIELD | Admitting: Physical Therapy

## 2017-11-12 ENCOUNTER — Encounter: Payer: Self-pay | Admitting: Physical Therapy

## 2017-11-12 ENCOUNTER — Ambulatory Visit: Payer: BLUE CROSS/BLUE SHIELD | Admitting: Physical Therapy

## 2017-11-12 DIAGNOSIS — M62838 Other muscle spasm: Secondary | ICD-10-CM | POA: Diagnosis not present

## 2017-11-12 DIAGNOSIS — R279 Unspecified lack of coordination: Secondary | ICD-10-CM

## 2017-11-12 DIAGNOSIS — M6281 Muscle weakness (generalized): Secondary | ICD-10-CM

## 2017-11-12 NOTE — Therapy (Signed)
[facility] [address] [place], [place], [address] Phone: [phone]   Fax:  [phone]  Physical Therapy Treatment  Patient Details  Name: Danielle Guzman MRN: [id number] Date of Birth: 12-Oct-1965 Referring Provider: Dr. Alvy Bimler   Encounter Date: 11/12/2017  PT End of Session - 11/12/17 1014    Visit Number  37    Date for PT Re-Evaluation  02/11/18    Authorization Type  self pay due to no more insurance coverage    PT Start Time  0930    PT Stop Time  1013    PT Time Calculation (min)  43 min    Activity Tolerance  Patient tolerated treatment well    Behavior During Therapy  [facility] for tasks assessed/performed       Past Medical History:  Diagnosis Date  . Cancer ([place]) 2018   endometrial   . Headache    hx migraines yrs ago  . Malaria as child  . Miscarriage    x 2  . Pneumonia yrs ago  . Seizures ([place])    petite mal seizure in high school x 1, none since    Past Surgical History:  Procedure Laterality Date  . DENTAL SURGERY     gum graft 20 yrs ago and again on 08/26/16  . DILATATION & CURETTAGE/HYSTEROSCOPY WITH MYOSURE N/A 08/11/2016   Procedure: DILATATION & CURETTAGE/HYSTEROSCOPY WITH MYOSURE;  Surgeon: Jerelyn Charles, MD;  Location: [facility];  Service: Gynecology;  Laterality: N/A;  polyp removal  . IR FLUORO GUIDE PORT INSERTION RIGHT  11/10/2016  . IR REMOVAL TUN ACCESS W/ PORT W/O FL MOD SED  03/11/2017  . IR US GUIDE VASC ACCESS RIGHT  11/10/2016  . LYMPH NODE BIOPSY N/A 09/01/2016   Procedure: SENTINEL LYMPH NODE BIOPSY;  Surgeon: Everitt Amber, MD;  Location: [facility];  Service: Gynecology;  Laterality: N/A;  . ROBOTIC ASSISTED TOTAL HYSTERECTOMY WITH BILATERAL SALPINGO OOPHERECTOMY Bilateral 09/01/2016   Procedure: ROBOTIC ASSISTED TOTAL HYSTERECTOMY WITH BILATERAL SALPINGO OOPHORECTOMY;  Surgeon: Everitt Amber, MD;  Location: [facility];  Service: Gynecology;  Laterality: Bilateral;  . WISDOM TOOTH  EXTRACTION      There were no vitals filed for this visit.  Subjective Assessment - 11/12/17 0932    Subjective  The last 3 times of manual work I was able to place larger head into the vagina. I have brought in what I am using at home. The urinary leakage is the same. I am still using the egg and doing squats. Hard to keep the small egg in. We can have the penis go deeper.     Pertinent History  ovarian cancer with complete hysterectomy 09/01/16 and SLNB, completed chemotherapy on 02/23/17    Patient Stated Goals  reduce leakage    Currently in Pain?  Yes    Pain Score  6     Pain Location  Vagina    Pain Orientation  Posterior    Pain Descriptors / Indicators  Sharp    Pain Type  Chronic pain    Pain Onset  More than a month ago    Pain Frequency  Intermittent    Aggravating Factors   intercourse after husband is placing penis in introitus    Pain Relieving Factors  no intercourse    Multiple Pain Sites  No                    Pelvic Floor Special Questions - 11/12/17 0001  Pelvic Floor Internal Exam  Patient confirms indentification and approves PT to assess pelvic floor integrity and treatment    Exam Type  Vaginal        [facility] Adult PT Treatment/Exercise - 11/12/17 0001      Self-Care   Self-Care  Other Self-Care Comments    Other Self-Care Comments   how to work with the dilator, how to progress pelvic floor strength with eggs      Manual Therapy   Manual Therapy  Myofascial release;Internal Pelvic Floor;Soft tissue mobilization    Soft tissue mobilization  separting the clitioral hood from the clitoris    Myofascial Release  around the clitorus, along the bulbocavernosus; deep posteriorly in th evaginal area reducing a tight band, a;ong the puborectalis and obturator internist.     Internal Pelvic Floor  along the levator ani more anteriorly, along the superior introitus with 2 fingers to release the tissue,              PT Education - 11/12/17  1014    Education provided  Yes    Education Details  how to progress herself with her dilator and vaginal eggs    Person(s) Educated  Patient    Methods  Explanation    Comprehension  Verbalized understanding       PT Short Term Goals - 05/24/17 1054      PT SHORT TERM GOAL #5   Title  ability to have penile penetration with her husband with pain decreased >/= 20% better     Time  8    Period  Weeks    Status  Achieved        PT Long Term Goals - 11/12/17 1018      PT LONG TERM GOAL #1   Title  Pt will be independent in a home exercise program for continued strengthening and stretching    Baseline  still learning    Time  8    Period  Weeks    Status  Achieved      PT LONG TERM GOAL #6   Title  urinary leakage decreased >/= 75% so she is using 1 light pad per day due to increased pelvic floor strength    Baseline  2 pads, 1 during the day and 1 at night that is 1/2-3/4 full    Time  8    Period  Weeks    Status  On-going      PT LONG TERM GOAL #7   Title  able to have penile penetration with >/= 50% decreased in pain    Baseline  unable to tolerate the full penile head into the vaginal introitus    Time  8    Period  Weeks    Status  On-going      PT LONG TERM GOAL #8   Title  Deep ache in the pelvis has decreased >/= 75% due to improved tissue mobility    Baseline  pain level more consistent with 1/10 and 80% better    Time  8    Status  Achieved            Plan - 11/12/17 1015    Clinical Impression Statement  No change in urinary leakage this time but whe will start the smaller vaginal egg now.  Patient is able to get her dilator in the vaginal canal 1 inch now but is block by the pelvic floor tissue.  During soft tissue work there is  a band internally that was resolved after manual work.  Patient has most tightness in the pelvic floor along the pubic rami to the ishcial tuberosity. After manual work the patient was able to walk differently.  Patient  reports her clitorus is 50% less sensitive due to the soft tissue work. Patient will benefit from skilled therapy to work on urinary leakage and deep penetrative pain.     Clinical Impairments Affecting Rehab Potential  pt with increased left knee and right shoulder pain    PT Frequency  Biweekly    PT Duration  Other (comment)   4 months   PT Treatment/Interventions  ADLs/Self Care Home Management;Electrical Stimulation;Neuromuscular re-education;Therapeutic exercise;Balance training;Manual techniques;Patient/family education;Therapeutic activities;Dry needling    PT Next Visit Plan  continues with soft tissue work to the vaginal area, work on pelvic floor contraction with sit to stand    PT Home Exercise Plan  progress as needed    Consulted and Agree with Plan of Care  Patient       Patient will benefit from skilled therapeutic intervention in order to improve the following deficits and impairments:  Decreased balance, Decreased endurance, Decreased mobility, Difficulty walking, Impaired sensation, Decreased strength, Pain, Postural dysfunction, Increased edema, Increased muscle spasms, Increased fascial restricitons  Visit Diagnosis: Other muscle spasm  Unspecified lack of coordination  Muscle weakness (generalized)     Problem List Patient Active Problem List   Diagnosis Date Noted  . Physical deconditioning 03/25/2017  . Obesity (BMI 30.0-34.9) 03/25/2017  . Drug-induced hyperglycemia 12/23/2016  . Port catheter in place 12/02/2016  . Dysuria 12/02/2016  . 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 Acute rhinitis 11/04/2016  . Goals of care, counseling/discussion 11/04/2016  . 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 Endometrial cancer ([place]) 08/24/2016    Earlie Counts, PT 11/12/17 10:19 AM   [facility] [address], [place], [place], [address] Phone:  [phone]   Fax:  [phone]  Name: Danielle Guzman MRN: [id number] Date of Birth: Aug 14, 1965

## 2017-11-24 ENCOUNTER — Ambulatory Visit: Payer: BLUE CROSS/BLUE SHIELD | Admitting: Physical Therapy

## 2017-12-01 ENCOUNTER — Ambulatory Visit: Payer: BLUE CROSS/BLUE SHIELD | Attending: Hematology and Oncology | Admitting: Physical Therapy

## 2017-12-01 ENCOUNTER — Encounter: Payer: Self-pay | Admitting: Physical Therapy

## 2017-12-01 ENCOUNTER — Encounter

## 2017-12-01 DIAGNOSIS — M62838 Other muscle spasm: Secondary | ICD-10-CM | POA: Diagnosis present

## 2017-12-01 DIAGNOSIS — R279 Unspecified lack of coordination: Secondary | ICD-10-CM | POA: Diagnosis present

## 2017-12-01 NOTE — Therapy (Signed)
[facility] [address], [place], [place], [address] Phone: [phone]   Fax:  [phone]  Physical Therapy Treatment  Patient Details  Name: Danielle Guzman MRN: [id number] Date of Birth: 11-05-1965 Referring Provider: Dr. Alvy Bimler   Encounter Date: 12/01/2017  PT End of Session - 12/01/17 1541    Visit Number  45    Date for PT Re-Evaluation  02/11/18    Authorization Type  self pay due to no more insurance coverage    Authorization Time Period  insurance will not pay for more visits. Patient is paying out of pocket    PT Start Time  1535    PT Stop Time  1613    PT Time Calculation (min)  38 min    Activity Tolerance  Patient tolerated treatment well    Behavior During Therapy  [facility] for tasks assessed/performed       Past Medical History:  Diagnosis Date  . Cancer ([place]) 2018   endometrial   . Headache    hx migraines yrs ago  . Malaria as child  . Miscarriage    x 2  . Pneumonia yrs ago  . 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 DENTAL SURGERY     gum graft 20 yrs ago and again on 08/26/16  . DILATATION & CURETTAGE/HYSTEROSCOPY WITH MYOSURE N/A 08/11/2016   Procedure: DILATATION & CURETTAGE/HYSTEROSCOPY WITH MYOSURE;  Surgeon: Jerelyn Charles, MD;  Location: [facility];  Service: Gynecology;  Laterality: N/A;  polyp removal  . IR FLUORO GUIDE PORT INSERTION RIGHT  11/10/2016  . IR REMOVAL TUN ACCESS W/ PORT W/O FL MOD SED  03/11/2017  . IR US GUIDE VASC ACCESS RIGHT  11/10/2016  . LYMPH NODE BIOPSY N/A 09/01/2016   Procedure: SENTINEL LYMPH NODE BIOPSY;  Surgeon: Everitt Amber, MD;  Location: [facility];  Service: Gynecology;  Laterality: N/A;  . ROBOTIC ASSISTED TOTAL HYSTERECTOMY WITH BILATERAL SALPINGO OOPHERECTOMY Bilateral 09/01/2016   Procedure: ROBOTIC ASSISTED TOTAL HYSTERECTOMY WITH BILATERAL SALPINGO OOPHORECTOMY;  Surgeon:  Everitt Amber, MD;  Location: [facility];  Service: Gynecology;  Laterality: Bilateral;  . WISDOM TOOTH EXTRACTION      There were no vitals filed for this visit.  Subjective Assessment - 12/01/17 1538    Subjective  Things are better.  I am having a hard time holding the smallest egg in standing and unable to squat. I am able to go 2 nights without urine in my pad.  I notice more of the dribble when going from sit to stand. My husband was able to go deeper in the  vagina. I am still using my therawand.     Pertinent History  ovarian cancer with complete hysterectomy 09/01/16 and SLNB, completed chemotherapy on 02/23/17    Patient Stated Goals  reduce leakage    Currently in Pain?  Yes    Pain Score  6     Pain Location  Vagina    Pain Orientation  Posterior    Pain Descriptors / Indicators  Sharp    Pain Type  Chronic pain    Pain Onset  More than a month ago    Pain Frequency  Intermittent    Aggravating Factors   intercourse after husband is placing penis in introitus    Pain Relieving Factors  no intercourse    Multiple Pain Sites  No  Pelvic Floor Special Questions - 12/01/17 0001    Pelvic Floor Internal Exam  Patient confirms indentification and approves PT to assess pelvic floor integrity and treatment    Exam Type  Vaginal    Strength  fair squeeze, definite lift        [facility] Adult PT Treatment/Exercise - 12/01/17 0001      Manual Therapy   Manual Therapy  Soft tissue mobilization;Myofascial release               PT Short Term Goals - 05/24/17 1054      PT SHORT TERM GOAL #5   Title  ability to have penile penetration with her husband with pain decreased >/= 20% better     Time  8    Period  Weeks    Status  Achieved        PT Long Term Goals - 12/01/17 1613      PT LONG TERM GOAL #6   Title  urinary leakage decreased >/= 75% so she is using 1 light pad per day due to increased pelvic floor strength    Baseline  2 pads, 1  during the day     Time  8    Period  Weeks    Status  On-going      PT LONG TERM GOAL #7   Title  able to have penile penetration with >/= 50% decreased in pain    Baseline  unable to tolerate the full penile head into the vaginal introitus    Time  8    Period  Weeks    Status  On-going      PT LONG TERM GOAL #8   Title  Deep ache in the pelvis has decreased >/= 75% due to improved tissue mobility    Baseline  pain level more consistent with 1/10 and 80% better    Time  8    Period  Weeks    Status  Achieved            Plan - 12/01/17 1541    Clinical Impression Statement  Patient has had 2 nights of no urinary leakage in her pad.  Patient reports her husband is able to insert his penis into the vaginal canal further.  Patient is able to hold the small egg in standing but not when she squats.  Patient will dribble urine going from sit to stand.  Patient will benefit from skilled therapy to work on urinary leakage and deep penetrative pain.     Rehab Potential  Excellent    Clinical Impairments Affecting Rehab Potential  pt with increased left knee and right shoulder pain    PT Frequency  Biweekly    PT Duration  Other (comment)   4 months   PT Treatment/Interventions  ADLs/Self Care Home Management;Electrical Stimulation;Neuromuscular re-education;Therapeutic exercise;Balance training;Manual techniques;Patient/family education;Therapeutic activities;Dry needling    PT Next Visit Plan  continues with soft tissue work to the vaginal area, work on pelvic floor contraction with sit to stand    PT Home Exercise Plan  progress as needed    Consulted and Agree with Plan of Care  Patient       Patient will benefit from skilled therapeutic intervention in order to improve the following deficits and impairments:  Decreased balance, Decreased endurance, Decreased mobility, Difficulty walking, Impaired sensation, Decreased strength, Pain, Postural dysfunction, Increased edema, Increased  muscle spasms, Increased fascial restricitons  Visit Diagnosis: Other muscle spasm  Unspecified lack of  coordination     Problem List Patient Active Problem List   Diagnosis Date Noted  . Physical deconditioning 03/25/2017  . Obesity (BMI 30.0-34.9) 03/25/2017  . Drug-induced hyperglycemia 12/23/2016  . Port catheter in place 12/02/2016  . Dysuria 12/02/2016  . Peripheral neuropathy due to chemotherapy ([place]) 12/02/2016  . Acute rhinitis 11/04/2016  . Goals of care, counseling/discussion 11/04/2016  . 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 Endometrial cancer ([place]) 08/24/2016    Earlie Counts, PT 12/01/17 4:14 PM   [facility] [address] [place], [place], [address] Phone: [phone]   Fax:  [phone]  Name: Danielle Guzman MRN: [id number] Date of Birth: [birth year]

## 2017-12-10 ENCOUNTER — Encounter: Payer: BLUE CROSS/BLUE SHIELD | Admitting: Physical Therapy

## 2017-12-14 ENCOUNTER — Encounter

## 2017-12-15 ENCOUNTER — Encounter: Payer: Self-pay | Admitting: Physical Therapy

## 2017-12-15 ENCOUNTER — Ambulatory Visit: Payer: BLUE CROSS/BLUE SHIELD | Attending: Hematology and Oncology | Admitting: Physical Therapy

## 2017-12-15 DIAGNOSIS — R279 Unspecified lack of coordination: Secondary | ICD-10-CM | POA: Diagnosis present

## 2017-12-15 DIAGNOSIS — M62838 Other muscle spasm: Secondary | ICD-10-CM | POA: Diagnosis not present

## 2017-12-15 DIAGNOSIS — M6281 Muscle weakness (generalized): Secondary | ICD-10-CM | POA: Diagnosis present

## 2017-12-15 NOTE — Therapy (Signed)
[facility] [address], [place], [place], [address] Phone: [phone]   Fax:  [phone]  Physical Therapy Treatment  Patient Details  Name: MERLINE PERKIN MRN: [id number] Date of Birth: 06-15-1965 Referring Provider (PT): Dr. Alvy Bimler   Encounter Date: 12/15/2017  PT End of Session - 12/15/17 1543    Visit Number  29    Date for PT Re-Evaluation  02/11/18    PT Start Time  7510    PT Stop Time  1615    PT Time Calculation (min)  40 min    Activity Tolerance  Patient tolerated treatment well    Behavior During Therapy  [facility] for tasks assessed/performed       Past Medical History:  Diagnosis Date  . Cancer ([place]) 2018   endometrial   . Headache    hx migraines yrs ago  . Malaria as child  . Miscarriage    x 2  . Pneumonia yrs ago  . Seizures ([place])    petite mal seizure in high school x 1, none since    Past Surgical History:  Procedure Laterality Date  . DENTAL SURGERY     gum graft 20 yrs ago and again on 08/26/16  . DILATATION & CURETTAGE/HYSTEROSCOPY WITH MYOSURE N/A 08/11/2016   Procedure: DILATATION & CURETTAGE/HYSTEROSCOPY WITH MYOSURE;  Surgeon: Jerelyn Charles, MD;  Location: [facility];  Service: Gynecology;  Laterality: N/A;  polyp removal  . IR FLUORO GUIDE PORT INSERTION RIGHT  11/10/2016  . IR REMOVAL TUN ACCESS W/ PORT W/O FL MOD SED  03/11/2017  . IR US GUIDE VASC ACCESS RIGHT  11/10/2016  . LYMPH NODE BIOPSY N/A 09/01/2016   Procedure: SENTINEL LYMPH NODE BIOPSY;  Surgeon: Everitt Amber, MD;  Location: [facility];  Service: Gynecology;  Laterality: N/A;  . ROBOTIC ASSISTED TOTAL HYSTERECTOMY WITH BILATERAL SALPINGO OOPHERECTOMY Bilateral 09/01/2016   Procedure: ROBOTIC ASSISTED TOTAL HYSTERECTOMY WITH BILATERAL SALPINGO OOPHORECTOMY;  Surgeon: Everitt Amber, MD;  Location: [facility];  Service: Gynecology;  Laterality: Bilateral;  . WISDOM TOOTH EXTRACTION      There were no vitals filed for this  visit.  Subjective Assessment - 12/15/17 1536    Subjective  I have not been doing my exercises with using the egg. My husband is not able to go deeper compared to last visit. After last visit the intial entryway was better.     Pertinent History  ovarian cancer with complete hysterectomy 09/01/16 and SLNB, completed chemotherapy on 02/23/17    Patient Stated Goals  reduce leakage    Currently in Pain?  Yes    Pain Score  6     Pain Location  Vagina    Pain Orientation  Posterior    Pain Descriptors / Indicators  Sharp    Pain Type  Chronic pain    Pain Onset  More than a month ago    Pain Frequency  Intermittent    Aggravating Factors   intercourse after husband is placing penis in introitus    Pain Relieving Factors  no intercourse    Multiple Pain Sites  No                       [facility] Adult PT Treatment/Exercise - 12/15/17 0001      Manual Therapy   Manual Therapy  Internal Pelvic Floor    Internal Pelvic Floor  along the introitus, bulbocavernosus, ischiocavernosus, levator ani  PT Short Term Goals - 05/24/17 1054      PT SHORT TERM GOAL #5   Title  ability to have penile penetration with her husband with pain decreased >/= 20% better     Time  8    Period  Weeks    Status  Achieved        PT Long Term Goals - 12/15/17 1708      PT LONG TERM GOAL #1   Title  Pt will be independent in a home exercise program for continued strengthening and stretching    Baseline  still learning    Time  8    Period  Weeks    Status  Achieved      PT LONG TERM GOAL #5   Title  Pt will be able to complete 13 sit to stands in 30 seconds to decrease risk of falls    Time  8    Period  Weeks    Status  Partially Met      PT LONG TERM GOAL #6   Title  urinary leakage decreased >/= 75% so she is using 1 light pad per day due to increased pelvic floor strength    Baseline  2 pads, 1 during the day     Time  8    Period  Weeks    Status  On-going       PT LONG TERM GOAL #7   Title  able to have penile penetration with >/= 50% decreased in pain    Baseline  unable to tolerate the full penile head into the vaginal introitus    Time  8    Period  Weeks    Status  On-going      PT LONG TERM GOAL #8   Title  Deep ache in the pelvis has decreased >/= 75% due to improved tissue mobility    Baseline  pain level more consistent with 1/10 and 80% better    Time  8    Period  Weeks    Status  Achieved            Plan - 12/15/17 1705    Clinical Impression Statement  Patient has not been able to perform her stretches and internal work so she has not significant changes since last visit.  Patient has increased releases in the fascia of the pelvic floor muscles.  Patient will leak urine going from sit to stand.  Patient will benefit from skilled therapy to work on urinary leakage and deep penetrative.     Rehab Potential  Excellent    Clinical Impairments Affecting Rehab Potential  pt with increased left knee and right shoulder pain    PT Frequency  Biweekly    PT Duration  Other (comment)   4 months   PT Treatment/Interventions  ADLs/Self Care Home Management;Electrical Stimulation;Neuromuscular re-education;Therapeutic exercise;Balance training;Manual techniques;Patient/family education;Therapeutic activities;Dry needling    PT Next Visit Plan  continues with soft tissue work to the vaginal area, work on pelvic floor contraction with sit to stand    PT Home Exercise Plan  progress as needed    Consulted and Agree with Plan of Care  Patient       Patient will benefit from skilled therapeutic intervention in order to improve the following deficits and impairments:  Decreased balance, Decreased endurance, Decreased mobility, Difficulty walking, Impaired sensation, Decreased strength, Pain, Postural dysfunction, Increased edema, Increased muscle spasms, Increased fascial restricitons  Visit Diagnosis: Other muscle spasm  Unspecified  lack of coordination  Muscle weakness (generalized)     Problem List Patient Active Problem List   Diagnosis Date Noted  . Physical deconditioning 03/25/2017  . Obesity (BMI 30.0-34.9) 03/25/2017  . Drug-induced hyperglycemia 12/23/2016  . Port catheter in place 12/02/2016  . Dysuria 12/02/2016  . 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 Acute rhinitis 11/04/2016  . Goals of care, counseling/discussion 11/04/2016  . 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 Endometrial cancer ([place]) 08/24/2016    Earlie Counts, PT 12/15/17 5:09 PM   [facility] [address], [place], [place], [address] Phone: [phone]   Fax:  [phone]  Name: LLESENIA FOGAL MRN: [id number] Date of Birth: 11-Oct-1965

## 2017-12-22 ENCOUNTER — Ambulatory Visit: Payer: BLUE CROSS/BLUE SHIELD | Admitting: Physical Therapy

## 2017-12-22 ENCOUNTER — Encounter: Payer: Self-pay | Admitting: Physical Therapy

## 2017-12-22 DIAGNOSIS — M62838 Other muscle spasm: Secondary | ICD-10-CM | POA: Diagnosis not present

## 2017-12-22 DIAGNOSIS — R279 Unspecified lack of coordination: Secondary | ICD-10-CM

## 2017-12-22 DIAGNOSIS — M6281 Muscle weakness (generalized): Secondary | ICD-10-CM

## 2017-12-22 NOTE — Therapy (Signed)
[facility] [address] [place], [place], [address] Phone: [phone]   Fax:  [phone]  Physical Therapy Treatment  Patient Details  Name: Danielle Guzman MRN: [id number] Date of Birth: [birth year] Referring Provider (PT): Dr. Alvy Bimler   Encounter Date: 12/22/2017  PT End of Session - 12/22/17 0935    Visit Number  [id number]    Date for PT Re-Evaluation  02/11/18    Authorization Type  self pay due to no more insurance coverage    Authorization Time Period  insurance will not pay for more visits. Patient is paying out of pocket    PT Start Time  0930    PT Stop Time  1010    PT Time Calculation (min)  40 min    Activity Tolerance  Patient tolerated treatment well    Behavior During Therapy  WFL for tasks assessed/performed       Past Medical History:  Diagnosis Date  . Cancer ([place]) 2018   endometrial   . Headache    hx migraines yrs ago  . Malaria as child  . Miscarriage    x 2  . Pneumonia yrs ago  . Seizures ([place])    petite mal seizure in high school x 1, none since    Past Surgical History:  Procedure Laterality Date  . DENTAL SURGERY     gum graft 20 yrs ago and again on 08/26/16  . DILATATION & CURETTAGE/HYSTEROSCOPY WITH MYOSURE N/A 08/11/2016   Procedure: DILATATION & CURETTAGE/HYSTEROSCOPY WITH MYOSURE;  Surgeon: Jerelyn Charles, MD;  Location: [facility];  Service: Gynecology;  Laterality: N/A;  polyp removal  . IR FLUORO GUIDE PORT INSERTION RIGHT  11/10/2016  . IR REMOVAL TUN ACCESS W/ PORT W/O FL MOD SED  03/11/2017  . IR US GUIDE VASC ACCESS RIGHT  11/10/2016  . LYMPH NODE BIOPSY N/A 09/01/2016   Procedure: SENTINEL LYMPH NODE BIOPSY;  Surgeon: Everitt Amber, MD;  Location: [facility];  Service: Gynecology;  Laterality: N/A;  . ROBOTIC ASSISTED TOTAL HYSTERECTOMY WITH BILATERAL SALPINGO OOPHERECTOMY Bilateral 09/01/2016   Procedure: ROBOTIC ASSISTED TOTAL HYSTERECTOMY WITH BILATERAL SALPINGO OOPHORECTOMY;   Surgeon: Everitt Amber, MD;  Location: [facility];  Service: Gynecology;  Laterality: Bilateral;  . WISDOM TOOTH EXTRACTION      There were no vitals filed for this visit.  Subjective Assessment - 12/22/17 0932    Subjective  I am still having trouble keeping the little egg in. I have the leakage when she gets up and get down. I still see progress. Unable to go all the way in. I can get up further but hit a point like I am going to tear.     Pertinent History  ovarian cancer with complete hysterectomy 09/01/16 and SLNB, completed chemotherapy on 02/23/17    Patient Stated Goals  reduce leakage    Currently in Pain?  Yes    Pain Location  Vagina    Pain Orientation  Posterior    Pain Descriptors / Indicators  Sharp    Pain Type  Chronic pain    Pain Onset  More than a month ago    Pain Frequency  Intermittent    Aggravating Factors   intercourse after husband is placing penis in introitus    Pain Relieving Factors  no intercourse    Multiple Pain Sites  No                    Pelvic Floor Special  Questions - 12/22/17 0001    Pelvic Floor Internal Exam  Patient confirms indentification and approves PT to assess pelvic floor integrity and treatment    Exam Type  Rectal        [facility] Adult PT Treatment/Exercise - 12/22/17 0001      Manual Therapy   Manual Therapy  Internal Pelvic Floor    Internal Pelvic Floor  bil. coccygeus, levator ani, and obturator internist through the anus        Trigger Point Dry Needling - 12/22/17 0944    Consent Given?  Yes    Muscles Treated Upper Body  --   coccygeus, obturator internist, levator ani   Muscles Treated Lower Body  --   trigger point response, elongation of tissue            PT Short Term Goals - 05/24/17 1054      PT SHORT TERM GOAL #5   Title  ability to have penile penetration with her husband with pain decreased >/= 20% better     Time  8    Period  Weeks    Status  Achieved        PT Long Term Goals -  12/22/17 1035      PT LONG TERM GOAL #1   Title  Pt will be independent in a home exercise program for continued strengthening and stretching    Baseline  still learning    Time  8    Period  Weeks    Status  Achieved      PT LONG TERM GOAL #6   Title  urinary leakage decreased >/= 75% so she is using 1 light pad per day due to increased pelvic floor strength    Baseline  2 pads, 1 during the day     Time  8    Period  Weeks    Status  On-going      PT LONG TERM GOAL #7   Title  able to have penile penetration with >/= 50% decreased in pain    Baseline  unable to tolerate the full penile head into the vaginal introitus    Time  8    Period  Weeks    Status  On-going            Plan - 12/22/17 1027    Clinical Impression Statement  Patient still leaks urine with going from sit to standing and is having trouble with holding the smallest egg. Patient has many trigger points in the anal canal with dry needling. After manual work there was a feeling of opening, easier to contract the pelvic floor and less pain. Patient still has pain with intercourse due to trigger points in the pelvic floor.  Patient will benefit from skilled therapy to improve urinary leakge with sit to stand and reduce pain with intercourse.     Rehab Potential  Excellent    Clinical Impairments Affecting Rehab Potential  pt with increased left knee and right shoulder pain    PT Frequency  Biweekly    PT Duration  Other (comment)   4 months   PT Treatment/Interventions  ADLs/Self Care Home Management;Electrical Stimulation;Neuromuscular re-education;Therapeutic exercise;Balance training;Manual techniques;Patient/family education;Therapeutic activities;Dry needling    PT Next Visit Plan  continues with soft tissue work to the vaginal area, work on pelvic floor contraction with sit to stand    PT Home Exercise Plan  progress as needed    Consulted and Agree with Plan of  Care  Patient       Patient will benefit  from skilled therapeutic intervention in order to improve the following deficits and impairments:  Decreased balance, Decreased endurance, Decreased mobility, Difficulty walking, Impaired sensation, Decreased strength, Pain, Postural dysfunction, Increased edema, Increased muscle spasms, Increased fascial restricitons  Visit Diagnosis: Other muscle spasm  Unspecified lack of coordination  Muscle weakness (generalized)     Problem List Patient Active Problem List   Diagnosis Date Noted  . Physical deconditioning 03/25/2017  . Obesity (BMI 30.0-34.9) 03/25/2017  . Drug-induced hyperglycemia 12/23/2016  . Port catheter in place 12/02/2016  . Dysuria 12/02/2016  . 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 Acute rhinitis 11/04/2016  . Goals of care, counseling/discussion 11/04/2016  . 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 Endometrial cancer ([place]) 08/24/2016    Earlie Counts, PT 12/22/17 10:36 AM   [facility] [address], [place], [place], [address] Phone: [phone]   Fax:  [phone]  Name: Danielle Guzman MRN: [id number] Date of Birth: April 14, 1965

## 2017-12-23 ENCOUNTER — Telehealth: Payer: Self-pay

## 2017-12-23 NOTE — Telephone Encounter (Addendum)
Incoming call from pt to reschedule earlier in day with Dr Denman George instead of 3:15 pm.  Rescheduled to 11 am - arrival 10-15 min early.  Pt voiced understanding. No other needs per pt at this time.

## 2017-12-24 ENCOUNTER — Inpatient Hospital Stay: Payer: BLUE CROSS/BLUE SHIELD

## 2017-12-24 ENCOUNTER — Inpatient Hospital Stay: Payer: BLUE CROSS/BLUE SHIELD | Admitting: Gynecologic Oncology

## 2017-12-24 ENCOUNTER — Inpatient Hospital Stay: Payer: BLUE CROSS/BLUE SHIELD | Attending: Hematology and Oncology | Admitting: Gynecologic Oncology

## 2017-12-24 ENCOUNTER — Encounter: Payer: Self-pay | Admitting: Gynecologic Oncology

## 2017-12-24 VITALS — BP 130/77 | HR 58 | Temp 98.0°F | Resp 18 | Ht 66.0 in | Wt 209.0 lb

## 2017-12-24 DIAGNOSIS — Z87891 Personal history of nicotine dependence: Secondary | ICD-10-CM | POA: Diagnosis not present

## 2017-12-24 DIAGNOSIS — C541 Malignant neoplasm of endometrium: Secondary | ICD-10-CM | POA: Diagnosis present

## 2017-12-24 DIAGNOSIS — E559 Vitamin D deficiency, unspecified: Secondary | ICD-10-CM

## 2017-12-24 DIAGNOSIS — Z90710 Acquired absence of both cervix and uterus: Secondary | ICD-10-CM | POA: Diagnosis not present

## 2017-12-24 DIAGNOSIS — Z90722 Acquired absence of ovaries, bilateral: Secondary | ICD-10-CM | POA: Diagnosis not present

## 2017-12-24 DIAGNOSIS — Z9221 Personal history of antineoplastic chemotherapy: Secondary | ICD-10-CM

## 2017-12-24 NOTE — Patient Instructions (Addendum)
Please notify Dr Denman George at phone number [phone] if you notice vaginal bleeding, new pelvic or abdominal pains, bloating, feeling full easy, or a change in bladder or bowel function.   We will check your vitamin D level today and contact you with the results.  Please call our office at the end of October or the beginning of November to schedule a follow-up appointment for January of 2020.

## 2017-12-24 NOTE — Progress Notes (Signed)
Follow-up Note: Gyn-Onc  Consult was requested by Dr. Loletta Specter and Dr Stann Mainland for the evaluation of Danielle Guzman 52 y.o. female  CC:  Chief Complaint  Patient presents with  . Endometrial cancer [facility])    Assessment/Plan:  Danielle Guzman  is a 52 y.o.  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 MSI unstable.   She is s/p staging surgery on 09/01/16. And s/p adjuvat carboplatin and paclitaxel x 6 cycles between 11/12/2016 - 02/23/17.   No evidence of recurrence on exam. Continue 3 monthly surveillance visits until December, 2020.  Declines genetics consultation despite loss of MSH6 on IHC.   Fatigue - offered stimulant medication, discussed side effects, patient declined.   Pelvic floor pain with levator spasm - recommend continued therapy with PT.   HPI: Danielle Guzman is a 52 year old [id number] who is seen in consultation at the request of Dr Stann Mainland and Dr Jerelyn Charles for endometrial cancer.  The patient reports early menopause at age 19 (all of her female relatives have had early menopause). She began experiencing postmenopausal bleeding in March 2018. She was seen by Dr Stann Mainland on 06/02/16 and a TVUS showed an endometrium of 1.8cm with a 3cm hypervascular polyp.  She was taken to the OR on 08/11/16 for a hysteroscopy/D&C with Dr Jerelyn Charles and the specimen showed endometrial adenocarcinoma with the majority of the specimen revealing grade 2 endometrioid endometrial cancer with a small focus (5%) of clear cell carcinoma and a few foci suspicious for serous carcinoma.  She is otherwise very healthy. She has had no prior surgeries or vaginal deliveries.  Her mother has a history of breast cancer x 3, she has a sister with a history of cervical cancer. Her maternal aunt has a history of breast cancer. A different maternal aunt had uterine and colon cancer. Her maternal cousin had uterine cancer. Her paternal grandmother had a diagnosis of  colon cancer and her paternal cousins had hysterectomies for "abnromal cells" but no cancer.  Interval Hx: On 09/01/16 she underwent robotic assisted total hysterectomy, BSO, SLN biopsy. Surgery was uncomplicated. The final pathology revealed a 1.8cm grade 2 endometrioid endometrial cancer ( with rare foci of squamous differentiation and a rare microscopic focus of serous differentiation). There was luminal foci of carcinoma within the right and left fallopian tubes and the right fallopian tube lumen involvement showed a focus of similar appearing chondroid stroma with adjacent focus of serous carcinoma.  The lymph nodes and cervix were negative for carcinoma. The uterine tumor showed no myometrial invasion and no LVSI. Final pathology revealed loss of MSH6 on IHC.   Declined genetics consultation.  Postoperatively she completed 6 cycles of carboplatin and paclitaxel between 11/12/17-12/11/9 after experiencing some trepidation regarding toxicity overwhelming benefits.   She has some persistent foot neuropathy post-chemotherapy and has some bilateral lower extremity edema. She see PT for neuropathy and pelvic pains.  Her friend recently died from advanced uterine cancer.  She has fatigue, weight gain and pelvic flood pain/dyspareunia.   Current Meds:  Outpatient Encounter Medications as of 12/24/2017  Medication Sig  . APPLE CIDER VINEGAR PO Take 1 Dose by mouth daily. Patient states she takes Gaffer 1tsp. Daily.  . Ascorbic Acid (VITAMIN C PO) Take 1 tablet by mouth 3 (three) times daily as needed (for immune health support or cold symptoms.).  Marland Kitchen b complex vitamins tablet Take 1 tablet by mouth daily.  Marland Kitchen  calcium citrate (CALCITRATE - DOSED IN MG ELEMENTAL CALCIUM) 950 MG tablet Take 200 mg of elemental calcium by mouth daily. Patient states she takes 6 pills a day.  . cholecalciferol (VITAMIN D) 1000 units tablet Take 10,000 Units by mouth daily. Patient states that she takes  10,000 units a day.  . furosemide (LASIX) 20 MG tablet Take 1 tablet (20 mg total) by mouth daily as needed.  . Omega-3 Fatty Acids (FISH OIL) 1000 MG CAPS Take 1 capsule by mouth daily.   Marland Kitchen OVER THE COUNTER MEDICATION Take 1 tablet by mouth daily. Tumeric once a day.  . Probiotic Product (PROBIOTIC DAILY PO) Take 1 capsule by mouth daily.  . Vitamin D, Ergocalciferol, (DRISDOL) 50000 units CAPS capsule Take 50,000 Units by mouth every 7 (seven) days.   No facility-administered encounter medications on file as of 12/24/2017.     Allergy:  Allergies  Allergen Reactions  . Latex     Long exposure causes a rash  . Tamiflu  [Oseltamivir Phosphate] Nausea Only    Social Hx:   Social History   Socioeconomic History  . Marital status: Married    Spouse name: [name]  . Number of children: 0  . Years of education: Not on file  . Highest education level: Not on file  Occupational History  . Occupation: massage therapist  Social Needs  . Financial resource strain: Not on file  . Food insecurity:    Worry: Not on file    Inability: Not on file  . Transportation needs:    Medical: Not on file    Non-medical: Not on file  Tobacco Use  . Smoking status: Former Smoker    Packs/day: 1.50    Years: 8.00    Pack years: 12.00    Types: Cigarettes  . Smokeless tobacco: Never Used  . Tobacco comment: quit smoking at age 6  Substance and Sexual Activity  . Alcohol use: Yes    Alcohol/week: 4.0 standard drinks    Types: 4 Glasses of wine per week    Comment: wine occ  . Drug use: No  . Sexual activity: Yes    Birth control/protection: Post-menopausal  Lifestyle  . Physical activity:    Days per week: Not on file    Minutes per session: Not on file  . Stress: Not on file  Relationships  . Social connections:    Talks on phone: Not on file    Gets together: Not on file    Attends religious service: Not on file    Active member of club or organization: Not on file     Attends meetings of clubs or organizations: Not on file    Relationship status: Not on file  . Intimate partner violence:    Fear of current or ex partner: Not on file    Emotionally abused: Not on file    Physically abused: Not on file    Forced sexual activity: Not on file  Other Topics Concern  . Not on file  Social History Narrative  . Not on file    Past Surgical Hx:  Past Surgical History:  Procedure Laterality Date  . DENTAL SURGERY     gum graft 20 yrs ago and again on 08/26/16  . DILATATION & CURETTAGE/HYSTEROSCOPY WITH MYOSURE N/A 08/11/2016   Procedure: DILATATION & CURETTAGE/HYSTEROSCOPY WITH MYOSURE;  Surgeon: Jerelyn Charles, MD;  Location: [facility];  Service: Gynecology;  Laterality: N/A;  polyp removal  . IR FLUORO GUIDE PORT  INSERTION RIGHT  11/10/2016  . IR REMOVAL TUN ACCESS W/ PORT W/O FL MOD SED  03/11/2017  . IR US GUIDE VASC ACCESS RIGHT  11/10/2016  . LYMPH NODE BIOPSY N/A 09/01/2016   Procedure: SENTINEL LYMPH NODE BIOPSY;  Surgeon: Everitt Amber, MD;  Location: [facility];  Service: Gynecology;  Laterality: N/A;  . ROBOTIC ASSISTED TOTAL HYSTERECTOMY WITH BILATERAL SALPINGO OOPHERECTOMY Bilateral 09/01/2016   Procedure: ROBOTIC ASSISTED TOTAL HYSTERECTOMY WITH BILATERAL SALPINGO OOPHORECTOMY;  Surgeon: Everitt Amber, MD;  Location: [facility];  Service: Gynecology;  Laterality: Bilateral;  . WISDOM TOOTH EXTRACTION      Past Medical Hx:  Past Medical History:  Diagnosis Date  . Cancer ([place]) 2018   endometrial   . Headache    hx migraines yrs ago  . Malaria as child  . Miscarriage    x 2  . Pneumonia yrs ago  . Seizures ([place])    petite mal seizure in high school x 1, none since  . Sinus infection 08/2017    Past Gynecological History:  Premature menopause No LMP recorded. Patient has had a hysterectomy.  Family Hx:  Family History  Problem Relation Age of Onset  . Cancer Mother   . Cancer Sister        Cervix  . Cancer Maternal Aunt        Breast, uterine  .  Cancer Paternal Grandmother        Colon    Review of Systems:  Constitutional  Feels well,    ENT Normal appearing ears and nares bilaterally Skin/Breast  No rash, sores, jaundice, itching, dryness Cardiovascular  No chest pain, shortness of breath, or edema  Pulmonary  No cough or wheeze.  Gastro Intestinal  No nausea, vomitting, or diarrhoea. No bright red blood per rectum, no abdominal pain, change in bowel movement, or constipation.  Genito Urinary  No frequency, urgency, dysuria, no bleeding Musculo Skeletal  + bilateral LE edema Neurologic  + foot neuropathy bilaterally  Psychology  No depression, anxiety, insomnia.   Vitals:  Blood pressure 130/77, pulse (!) 58, temperature 98 F (36.7 C), temperature source Oral, resp. rate 18, height '5\' 6"'  (1.676 m), weight 209 lb (94.8 kg), SpO2 100 %.  Physical Exam: WD in NAD Neck  Supple NROM, without any enlargements.  Lymph Node Survey No cervical supraclavicular or inguinal adenopathy Cardiovascular  Pulse normal rate, regularity and rhythm. S1 and S2 normal.  Lungs  Clear to auscultation bilateraly, without wheezes/crackles/rhonchi. Good air movement.  Skin  No rash/lesions/breakdown  Psychiatry  Alert and oriented to person, place, and time  Abdomen  Normoactive bowel sounds, abdomen soft, non-tender and obese without evidence of hernia. Soft incisions.  Back No CVA tenderness Genito Urinary  : vaginal cuff with 2 small fronds of granulation tissue. No other lesions or masses. + levator spasm on right with associated tenderness.  Rectal  deferred Extremities  No bilateral cyanosis, clubbing or edema.  Thereasa Solo, MD  12/24/2017, 11:48 AM

## 2017-12-25 ENCOUNTER — Other Ambulatory Visit: Payer: Self-pay | Admitting: Gynecologic Oncology

## 2017-12-25 DIAGNOSIS — E559 Vitamin D deficiency, unspecified: Secondary | ICD-10-CM

## 2017-12-25 LAB — VITAMIN D 25 HYDROXY (VIT D DEFICIENCY, FRACTURES): VIT D 25 HYDROXY: 17.8 ng/mL — AB (ref 30.0–100.0)

## 2017-12-25 MED ORDER — VITAMIN D (ERGOCALCIFEROL) 1.25 MG (50000 UNIT) PO CAPS: 50000.0000 [IU] | ORAL_CAPSULE | ORAL | 3 refills | Status: DC

## 2017-12-25 NOTE — Progress Notes (Unsigned)
represcribed Vitamin D 50000 units as originally prescribed by Dr Alvy Bimler as her vitamin D level came back low.  Given that I don't treat Vitamin D Deficiency, I won't be able to manage this condition long term or be able to provide additional prescriptions, and [place] should follow-up with her PCP regarding this.   Thereasa Solo, MD

## 2017-12-27 ENCOUNTER — Telehealth: Payer: Self-pay

## 2017-12-27 NOTE — Telephone Encounter (Signed)
Discussed the vitamin D level as noted below by Dr. Denman George.  Danielle Guzman is searching for a PCP. Told her to call our office with the name and fax number of the new physician and we can send the vitamin D level result from 12-24-17 to them for a base line reference.Marland Kitchen

## 2017-12-27 NOTE — Telephone Encounter (Signed)
      Danielle Guzman Female, 52 y.o., Aug 11, 1965 MRN:  [id number] Phone:  [phone] Jerilynn Mages) PCP:  Pllc, [facility] Coverage:  BLUE CROSS BLUE SHIELD/BCBS OTHER Next Appt With Rehabilitation 01/05/2018 at 11:45 AM Patient Calls   Everitt Amber, MD  You; Dorothyann Gibbs, NP 2 days ago    I sent a Rx for vitamin D through to [employer].  Would you mind letting her know her Vit D is low.  Because this is not a condition I treat, I won't be able to monitor her levels and provide additional prescriptions, but she should see her PCP at some point in the near future to ensure she is receiving appropriate treatment for this.   Thereasa Solo, MD    Routing comment     Everitt Amber, MD 2 days ago      represcribed Vitamin D 50000 units as originally prescribed by Dr Alvy Bimler as her vitamin D level came back low.  Given that I don't treat Vitamin D Deficiency, I won't be able to manage this condition long term or be able to provide additional prescriptions, and [place] should follow-up with her PCP regarding this.   Thereasa Solo, MD     Unsigned  Documentation     Orders Placed This Encounter    Active   Vitamin D, Ergocalciferol, (DRISDOL) 50000 units CAPS capsule Ordered On: 12/25/2017

## 2018-01-05 ENCOUNTER — Ambulatory Visit: Payer: BLUE CROSS/BLUE SHIELD | Admitting: Physical Therapy

## 2018-01-05 ENCOUNTER — Encounter: Payer: Self-pay | Admitting: Physical Therapy

## 2018-01-05 DIAGNOSIS — M62838 Other muscle spasm: Secondary | ICD-10-CM | POA: Diagnosis not present

## 2018-01-05 DIAGNOSIS — M6281 Muscle weakness (generalized): Secondary | ICD-10-CM

## 2018-01-05 DIAGNOSIS — R279 Unspecified lack of coordination: Secondary | ICD-10-CM

## 2018-01-05 NOTE — Therapy (Signed)
[facility] [address] [place], [place], [address] Phone: [phone]   Fax:  [phone]  Physical Therapy Treatment  Patient Details  Name: Danielle Guzman MRN: [id number] Date of Birth: 1965-07-09 Referring Provider (PT): Dr. Alvy Bimler   Encounter Date: 01/05/2018  PT End of Session - 01/05/18 1158    Visit Number  16    Date for PT Re-Evaluation  02/11/18    Authorization Type  self pay due to no more insurance coverage    PT Start Time  1145    PT Stop Time  1228    PT Time Calculation (min)  43 min    Activity Tolerance  Patient tolerated treatment well    Behavior During Therapy  [facility] for tasks assessed/performed       Past Medical History:  Diagnosis Date  . Cancer ([place]) 2018   endometrial   . Headache    hx migraines yrs ago  . Malaria as child  . Miscarriage    x 2  . Pneumonia yrs ago  . Seizures ([place])    petite mal seizure in high school x 1, none since  . Sinus infection 08/2017    Past Surgical History:  Procedure Laterality Date  . DENTAL SURGERY     gum graft 20 yrs ago and again on 08/26/16  . DILATATION & CURETTAGE/HYSTEROSCOPY WITH MYOSURE N/A 08/11/2016   Procedure: DILATATION & CURETTAGE/HYSTEROSCOPY WITH MYOSURE;  Surgeon: Jerelyn Charles, MD;  Location: [facility];  Service: Gynecology;  Laterality: N/A;  polyp removal  . IR FLUORO GUIDE PORT INSERTION RIGHT  11/10/2016  . IR REMOVAL TUN ACCESS W/ PORT W/O FL MOD SED  03/11/2017  . IR US GUIDE VASC ACCESS RIGHT  11/10/2016  . LYMPH NODE BIOPSY N/A 09/01/2016   Procedure: SENTINEL LYMPH NODE BIOPSY;  Surgeon: Everitt Amber, MD;  Location: [facility];  Service: Gynecology;  Laterality: N/A;  . ROBOTIC ASSISTED TOTAL HYSTERECTOMY WITH BILATERAL SALPINGO OOPHERECTOMY Bilateral 09/01/2016   Procedure: ROBOTIC ASSISTED TOTAL HYSTERECTOMY WITH BILATERAL SALPINGO OOPHORECTOMY;  Surgeon: Everitt Amber, MD;  Location: [facility];  Service: Gynecology;  Laterality:  Bilateral;  . WISDOM TOOTH EXTRACTION      There were no vitals filed for this visit.  Subjective Assessment - 01/05/18 1149    Subjective  I still have a hard time keeping the little egg in. Pad is 50% dryer. I had 2 days of bleeding  after I used the large side of the wand.     Pertinent History  ovarian cancer with complete hysterectomy 09/01/16 and SLNB, completed chemotherapy on 02/23/17    Patient Stated Goals  reduce leakage    Currently in Pain?  Yes    Pain Score  1     Pain Location  Vagina    Pain Orientation  Posterior    Pain Descriptors / Indicators  Aching    Pain Type  Chronic pain    Pain Onset  More than a month ago    Pain Frequency  Constant    Aggravating Factors   intercourse after husband is placing penis in intriotus    Pain Relieving Factors  no intercourse    Multiple Pain Sites  No                    Pelvic Floor Special Questions - 01/05/18 0001    Pelvic Floor Internal Exam  Patient confirms indentification and approves PT to assess pelvic floor integrity  and treatment    Exam Type  Rectal;Vaginal    Palpation  tightness in the perineal body and obturator internist    Strength  fair squeeze, definite lift        [facility] Adult PT Treatment/Exercise - 01/05/18 0001      Manual Therapy   Manual Therapy  Soft tissue mobilization;Internal Pelvic Floor    Soft tissue mobilization  outside of the perineal body and long the anterior anus    Internal Pelvic Floor  one finger in the rectum and other on the perineal body and superior transverse perineum, and bulbocavernosus, perineal , external anal sphincter; one finger in the vaginal performing soft tissue work to the bulbocavernosus, anterior portion of the perineal body        Trigger Point Dry Needling - 01/05/18 1227    Consent Given?  Yes    Muscles Treated Upper Body  --   perineal body, superficial transeverse perineum, levator ani   Muscles Treated Lower Body  --   elongation of  tissue; trigger point response            PT Short Term Goals - 05/24/17 1054      PT SHORT TERM GOAL #5   Title  ability to have penile penetration with her husband with pain decreased >/= 20% better     Time  8    Period  Weeks    Status  Achieved        PT Long Term Goals - 01/05/18 1415      PT LONG TERM GOAL #6   Title  urinary leakage decreased >/= 75% so she is using 1 light pad per day due to increased pelvic floor strength    Baseline  2 pads, 1 during the day ; reports it is 50% better    Time  8    Period  Weeks    Status  On-going      PT LONG TERM GOAL #7   Title  able to have penile penetration with >/= 50% decreased in pain    Baseline  unable to tolerate the full penile head into the vaginal introitus    Time  8    Period  Weeks    Status  On-going      PT LONG TERM GOAL #8   Title  Deep ache in the pelvis has decreased >/= 75% due to improved tissue mobility    Baseline  pain level more consistent with 1/10 and 80% better    Time  8    Period  Weeks    Status  Achieved            Plan - 01/05/18 1412    Clinical Impression Statement  Patient reports her pads are 50% dryer due to urinary leakage is better. She continues to have pain with intercourse with initial penetration and the head of the penis is inserted.  Patient had trigger point response during the dry needling and fascial releases to the pelvic floor with soft tissue work. Patient was able to contract the pelvic floor better going from sitting to standing without leakage after therapy.  Patient had stronger circular contraction of the pelvic floor after manaual work. Patient will benfit from skilled therapy to improve urinary leakage with sit to stand and reduce pain with intercourse.     Rehab Potential  Excellent    Clinical Impairments Affecting Rehab Potential  pt with increased left knee and right shoulder pain  PT Frequency  Biweekly    PT Duration  Other (comment)   4 months    PT Treatment/Interventions  ADLs/Self Care Home Management;Electrical Stimulation;Neuromuscular re-education;Therapeutic exercise;Balance training;Manual techniques;Patient/family education;Therapeutic activities;Dry needling    PT Next Visit Plan  continues with soft tissue work to the vaginal area, work on pelvic floor contraction with sit to stand    PT Home Exercise Plan  progress as needed    Consulted and Agree with Plan of Care  Patient       Patient will benefit from skilled therapeutic intervention in order to improve the following deficits and impairments:  Decreased balance, Decreased endurance, Decreased mobility, Difficulty walking, Impaired sensation, Decreased strength, Pain, Postural dysfunction, Increased edema, Increased muscle spasms, Increased fascial restricitons  Visit Diagnosis: Other muscle spasm  Unspecified lack of coordination  Muscle weakness (generalized)     Problem List Patient Active Problem List   Diagnosis Date Noted  . Vitamin D deficiency 12/25/2017  . Physical deconditioning 03/25/2017  . Obesity (BMI 30.0-34.9) 03/25/2017  . Drug-induced hyperglycemia 12/23/2016  . Port catheter in place 12/02/2016  . Dysuria 12/02/2016  . 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 Acute rhinitis 11/04/2016  . Goals of care, counseling/discussion 11/04/2016  . 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 Endometrial cancer ([place]) 08/24/2016    Earlie Counts, PT 01/05/18 2:16 PM   [facility] [address], [place], [place], [address] Phone: [phone]   Fax:  [phone]  Name: Danielle Guzman MRN: [id number] Date of Birth: [birth year]

## 2018-01-19 ENCOUNTER — Encounter: Payer: Self-pay | Admitting: Physical Therapy

## 2018-01-19 ENCOUNTER — Ambulatory Visit: Payer: BLUE CROSS/BLUE SHIELD | Attending: Hematology and Oncology | Admitting: Physical Therapy

## 2018-01-19 DIAGNOSIS — M62838 Other muscle spasm: Secondary | ICD-10-CM | POA: Diagnosis not present

## 2018-01-19 DIAGNOSIS — R279 Unspecified lack of coordination: Secondary | ICD-10-CM | POA: Insufficient documentation

## 2018-01-19 DIAGNOSIS — M6281 Muscle weakness (generalized): Secondary | ICD-10-CM | POA: Insufficient documentation

## 2018-01-19 NOTE — Therapy (Signed)
[facility] [address] [place], [place], [address] Phone: [phone]   Fax:  [phone]  Physical Therapy Treatment  Patient Details  Name: Danielle Guzman MRN: [id number] Date of Birth: 17-Mar-1965 Referring Provider (PT): Dr. Alvy Bimler   Encounter Date: 01/19/2018  PT End of Session - 01/19/18 1025    Visit Number  33    Date for PT Re-Evaluation  02/11/18    Authorization Type  self pay due to no more insurance coverage    PT Start Time  1020    PT Stop Time  1100    PT Time Calculation (min)  40 min    Activity Tolerance  Patient tolerated treatment well    Behavior During Therapy  [facility] for tasks assessed/performed       Past Medical History:  Diagnosis Date  . Cancer ([place]) 2018   endometrial   . Headache    hx migraines yrs ago  . Malaria as child  . Miscarriage    x 2  . Pneumonia yrs ago  . 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 Sinus infection 08/2017    Past Surgical History:  Procedure Laterality Date  . DENTAL SURGERY     gum graft 20 yrs ago and again on 08/26/16  . DILATATION & CURETTAGE/HYSTEROSCOPY WITH MYOSURE N/A 08/11/2016   Procedure: DILATATION & CURETTAGE/HYSTEROSCOPY WITH MYOSURE;  Surgeon: Jerelyn Charles, MD;  Location: [facility];  Service: Gynecology;  Laterality: N/A;  polyp removal  . IR FLUORO GUIDE PORT INSERTION RIGHT  11/10/2016  . IR REMOVAL TUN ACCESS W/ PORT W/O FL MOD SED  03/11/2017  . IR US GUIDE VASC ACCESS RIGHT  11/10/2016  . LYMPH NODE BIOPSY N/A 09/01/2016   Procedure: SENTINEL LYMPH NODE BIOPSY;  Surgeon: Everitt Amber, MD;  [facility];  Service: Gynecology;  Laterality: N/A;  . ROBOTIC ASSISTED TOTAL HYSTERECTOMY WITH BILATERAL SALPINGO OOPHERECTOMY Bilateral 09/01/2016   Procedure: ROBOTIC ASSISTED TOTAL HYSTERECTOMY WITH BILATERAL SALPINGO OOPHORECTOMY;  Surgeon: Everitt Amber, MD;  [facility];  Service: Gynecology;  Laterality:  Bilateral;  . WISDOM TOOTH EXTRACTION      There were no vitals filed for this visit.  Subjective Assessment - 01/19/18 1020    Subjective  I am still using the small egg and still has leakage. I have slept 4 nights without underwear for first time due to no leakage. During intercourse I was able to tolerate full penetration with pain. It has been 6 years since I was able to have full penetration. I am working with the dilator and breathing.     Pertinent History  ovarian cancer with complete hysterectomy 09/01/16 and SLNB, completed chemotherapy on 02/23/17    Patient Stated Goals  reduce leakage    Currently in Pain?  Yes    Pain Score  8     Pain Location  Vagina    Pain Orientation  Mid    Pain Descriptors / Indicators  Aching    Pain Type  Chronic pain    Pain Onset  More than a month ago    Aggravating Factors   intercourse    Pain Relieving Factors  no intercourse    Multiple Pain Sites  No                    Pelvic Floor Special Questions - 01/19/18 0001    Pelvic Floor Internal Exam  Patient confirms indentification and approves PT to assess pelvic floor integrity and treatment    Exam Type  Rectal;Vaginal    Strength  fair squeeze, definite lift   4/5 for 3 sec 3 times       [facility] Adult PT Treatment/Exercise - 01/19/18 0001      Neuro Re-ed    Neuro Re-ed Details   pelvic floor contraction with tactile cues internally with tapping to the ant. and post. introitus      Manual Therapy   Manual Therapy  Internal Pelvic Floor    Internal Pelvic Floor  internal rectally with soft tissue work to the obturator internist, levator ani, along the anterior sphincter where it refers to the pelvic floor, vaginal soft tissue work to the perineal body, posterior introitus, pubococcygeus, transvers perineum; many fascial releases during the soft tissue work               PT Short Term Goals - 05/24/17 1054      PT SHORT TERM GOAL #5   Title  ability to have  penile penetration with her husband with pain decreased >/= 20% better     Time  8    Period  Weeks    Status  Achieved        PT Long Term Goals - 01/19/18 1159      PT LONG TERM GOAL #6   Title  urinary leakage decreased >/= 75% so she is using 1 light pad per day due to increased pelvic floor strength    Baseline  2 pads, 1 during the day ; reports it is 50% better    Time  8    Period  Weeks    Status  On-going      PT LONG TERM GOAL #7   Title  able to have penile penetration with >/= 50% decreased in pain    Baseline  was able to have full penile penetration with pain    Time  8    Period  Weeks    Status  On-going            Plan - 01/19/18 1026    Clinical Impression Statement  Patient was able to tolerate full penetration of the penis for first time in 7 years. Patient reports pain with the penetration.  Patient was able to sleep during the night 4 times without wearing underwear for first time. Patient was able to contract to 4/5 for 3 seconds 2 times but most contractions were at 3/5. Patient will benefit from skilled therapy to improve urinary leakage with sit to stand and reduce pain with intercourse.     Rehab Potential  Excellent    Clinical Impairments Affecting Rehab Potential  pt with increased left knee and right shoulder pain    PT Frequency  Biweekly    PT Duration  Other (comment)   4 months   PT Treatment/Interventions  ADLs/Self Care Home Management;Electrical Stimulation;Neuromuscular re-education;Therapeutic exercise;Balance training;Manual techniques;Patient/family education;Therapeutic activities;Dry needling    PT Next Visit Plan  continues with soft tissue work to the vaginal area, work on pelvic floor contraction with sit to stand; renewal note needed    PT Home Exercise Plan  progress as needed    Consulted and Agree with Plan of Care  Patient       Patient will benefit from skilled therapeutic intervention in order to improve the following  deficits and impairments:  Decreased balance, Decreased endurance, Decreased mobility, Difficulty walking, Impaired  sensation, Decreased strength, Pain, Postural dysfunction, Increased edema, Increased muscle spasms, Increased fascial restricitons  Visit Diagnosis: Other muscle spasm  Unspecified lack of coordination  Muscle weakness (generalized)     Problem List Patient Active Problem List   Diagnosis Date Noted  . Vitamin D deficiency 12/25/2017  . Physical deconditioning 03/25/2017  . Obesity (BMI 30.0-34.9) 03/25/2017  . Drug-induced hyperglycemia 12/23/2016  . Port catheter in place 12/02/2016  . Dysuria 12/02/2016  . 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 Acute rhinitis 11/04/2016  . Goals of care, counseling/discussion 11/04/2016  . 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 Endometrial cancer ([place]) 08/24/2016    Earlie Counts, PT 01/19/18 12:01 PM   [facility] [address] [place], [place], [address] Phone: [phone]   Fax:  [phone]  Name: AKELA POCIUS MRN: [id number] Date of Birth: Aug 22, 1965

## 2018-02-02 ENCOUNTER — Encounter: Payer: Self-pay | Admitting: Physical Therapy

## 2018-02-02 ENCOUNTER — Ambulatory Visit: Payer: BLUE CROSS/BLUE SHIELD | Admitting: Physical Therapy

## 2018-02-02 DIAGNOSIS — M62838 Other muscle spasm: Secondary | ICD-10-CM | POA: Diagnosis not present

## 2018-02-02 DIAGNOSIS — R279 Unspecified lack of coordination: Secondary | ICD-10-CM

## 2018-02-02 DIAGNOSIS — M6281 Muscle weakness (generalized): Secondary | ICD-10-CM

## 2018-02-02 NOTE — Therapy (Signed)
[facility] [address], [place], [place], [address] Phone: [phone]   Fax:  [phone]  Physical Therapy Treatment  Patient Details  Name: Danielle Guzman MRN: [id number] Date of Birth: Sep 13, 1965 Referring Provider (PT): Dr. Alvy Bimler   Encounter Date: 02/02/2018  PT End of Session - 02/02/18 1018    Visit Number  50    Date for PT Re-Evaluation  04/27/18    Authorization Type  self pay due to no more insurance coverage    PT Start Time  1015    PT Stop Time  1055    PT Time Calculation (min)  40 min    Activity Tolerance  Patient tolerated treatment well    Behavior During Therapy  [facility] for tasks assessed/performed       Past Medical History:  Diagnosis Date  . Cancer ([place]) 2018   endometrial   . Headache    hx migraines yrs ago  . Malaria as child  . Miscarriage    x 2  . Pneumonia yrs ago  . Seizures ([place])    petite mal seizure in high school x 1, none since  . Sinus infection 08/2017    Past Surgical History:  Procedure Laterality Date  . DENTAL SURGERY     gum graft 20 yrs ago and again on 08/26/16  . DILATATION & CURETTAGE/HYSTEROSCOPY WITH MYOSURE N/A 08/11/2016   Procedure: DILATATION & CURETTAGE/HYSTEROSCOPY WITH MYOSURE;  Surgeon: Jerelyn Charles, MD;  Location: [facility];  Service: Gynecology;  Laterality: N/A;  polyp removal  . IR FLUORO GUIDE PORT INSERTION RIGHT  11/10/2016  . IR REMOVAL TUN ACCESS W/ PORT W/O FL MOD SED  03/11/2017  . IR US GUIDE VASC ACCESS RIGHT  11/10/2016  . LYMPH NODE BIOPSY N/A 09/01/2016   Procedure: SENTINEL LYMPH NODE BIOPSY;  Surgeon: Everitt Amber, MD;  [facility];  Service: Gynecology;  Laterality: N/A;  . ROBOTIC ASSISTED TOTAL HYSTERECTOMY WITH BILATERAL SALPINGO OOPHERECTOMY Bilateral 09/01/2016   Procedure: ROBOTIC ASSISTED TOTAL HYSTERECTOMY WITH BILATERAL SALPINGO OOPHORECTOMY;  Surgeon: Everitt Amber, MD;  [facility];  Service: Gynecology;  Laterality:  Bilateral;  . WISDOM TOOTH EXTRACTION      There were no vitals filed for this visit.  Subjective Assessment - 02/02/18 1020    Subjective  3 weeks without pad at night; need pad during the day due to leakage going from sit to stand. Sit to stand is 80% better from initial evaluation.     Pertinent History  ovarian cancer with complete hysterectomy 09/01/16 and SLNB, completed chemotherapy on 02/23/17    Patient Stated Goals  reduce leakage and pain with intercourse    Currently in Pain?  Yes    Pain Score  8     Pain Location  Vagina    Pain Orientation  Mid    Pain Descriptors / Indicators  Aching    Pain Type  Chronic pain    Pain Onset  More than a month ago    Pain Frequency  Intermittent    Aggravating Factors   penile penetration    Pain Relieving Factors  no intercourse    Multiple Pain Sites  No         [facility] PT Assessment - 02/02/18 0001      Assessment   Medical Diagnosis  C79.82 secondary malignant neoplasm of fallopian tube    Referring Provider (PT)  Dr. Alvy Bimler    Onset Date/Surgical Date  09/01/17  Precautions   Precautions  Other (comment)    Precaution Comments  at risk for LE lymphedema      Restrictions   Weight Bearing Restrictions  No      Home Environment   Living Environment  Private residence    Living Arrangements  Spouse/significant other    Available Help at Discharge  Family    Type of [place] to enter    Entrance Stairs-Number of Steps  6      Prior Function   Level of Independence  Independent      Cognition   Overall Cognitive Status  Within Functional Limits for tasks assessed      Posture/Postural Control   Posture/Postural Control  No significant limitations      ROM / Strength   AROM / PROM / Strength  Strength      Strength   Right Hip Flexion  4+/5    Right Hip ABduction  3+/5    Right Hip ADduction  5/5    Left Hip Flexion  4/5    Left Hip ABduction  4-/5    Left Hip ADduction  4/5                 Pelvic Floor Special Questions - 02/02/18 0001    Urinary Leakage  Yes    Activities that cause leaking  Other    Other activities that cause leaking  sit to stand    Pelvic Floor Internal Exam  Patient confirms indentification and approves PT to assess pelvic floor integrity and treatment    Exam Type  Vaginal;Rectal    Strength  fair squeeze, definite lift   4/5 for 3 sec 3 times       [facility] Adult PT Treatment/Exercise - 02/02/18 0001      Manual Therapy   Manual Therapy  Internal Pelvic Floor    Internal Pelvic Floor  internal rectally with soft tissue work to the obturator internist, levator ani, along the anterior sphincter where it refers to the pelvic floor, vaginal soft tissue work to the perineal body, posterior introitus, pubococcygeus, transvers perineum; many fascial releases during the soft tissue work       Trigger [employer] Needling - 02/02/18 1040    Consent Given?  Yes    Muscles Treated Upper Body  --   left coccygeus, perineal boby, levator ani   Muscles Treated Lower Body  --   trigger point response, elogation of tissue            PT Short Term Goals - 05/24/17 1054      PT SHORT TERM GOAL #5   Title  ability to have penile penetration with her husband with pain decreased >/= 20% better     Time  8    Period  Weeks    Status  Achieved        PT Long Term Goals - 02/02/18 1101      PT LONG TERM GOAL #6   Title  urinary leakage decreased >/= 75% so she is using 1 light pad per day due to increased pelvic floor strength    Period  Weeks    Status  On-going      PT LONG TERM GOAL #7   Title  able to have penile penetration with >/= 50% decreased in pain    Time  8    Period  Weeks    Status  On-going            Plan - 02/02/18 1020    Clinical Impression Statement  Patient is able to have full penile penetration with pain level 8/10 for the first time in 7 years. Patient reports her urinary leakage for sit  to stand has improved by 80% since the nitial evaluation.  Patient is now using 1 pad compared to 5 pads during the day.  Patient is pad free for 3 nights for the week. Patient  is able to contract to 4/5 for 3 seconds for 2 times and most contraction is 3/5. Patient continues to have many trigger points in the pelvic floor muscles. Patient will benefit from skilled therapy to improve urinary leakage with sit to stand and reduce pain with intercourse.     Rehab Potential  Excellent    Clinical Impairments Affecting Rehab Potential  pt with increased left knee and right shoulder pain    PT Frequency  Biweekly    PT Duration  Other (comment)   12 weeks   PT Treatment/Interventions  ADLs/Self Care Home Management;Electrical Stimulation;Neuromuscular re-education;Therapeutic exercise;Balance training;Manual techniques;Patient/family education;Therapeutic activities;Dry needling    PT Next Visit Plan  continues with soft tissue work to the vaginal area, work on pelvic floor contraction with sit to stand; renewal note needed    PT Home Exercise Plan  progress as needed    Recommended Other Services  sent MD renewal note    Consulted and Agree with Plan of Care  Patient       Patient will benefit from skilled therapeutic intervention in order to improve the following deficits and impairments:  Decreased balance, Decreased endurance, Decreased mobility, Difficulty walking, Impaired sensation, Decreased strength, Pain, Postural dysfunction, Increased edema, Increased muscle spasms, Increased fascial restricitons  Visit Diagnosis: Other muscle spasm  Unspecified lack of coordination  Muscle weakness (generalized)     Problem List Patient Active Problem List   Diagnosis Date Noted  . Vitamin D deficiency 12/25/2017  . Physical deconditioning 03/25/2017  . Obesity (BMI 30.0-34.9) 03/25/2017  . Drug-induced hyperglycemia 12/23/2016  . Port catheter in place 12/02/2016  . Dysuria 12/02/2016  .  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 Acute rhinitis 11/04/2016  . Goals of care, counseling/discussion 11/04/2016  . 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 Endometrial cancer ([place]) 08/24/2016    Earlie Counts, PT 02/02/18 11:03 AM   [facility] [address], [place], [place], [address] Phone: [phone]   Fax:  [phone]  Name: Danielle Guzman MRN: [id number] Date of Birth: January 19, 1966

## 2018-02-22 ENCOUNTER — Other Ambulatory Visit: Payer: Self-pay | Admitting: Gynecologic Oncology

## 2018-02-22 ENCOUNTER — Encounter: Payer: Self-pay | Admitting: Physical Therapy

## 2018-02-22 ENCOUNTER — Telehealth: Payer: Self-pay | Admitting: *Deleted

## 2018-02-22 ENCOUNTER — Ambulatory Visit: Payer: BLUE CROSS/BLUE SHIELD | Attending: Hematology and Oncology | Admitting: Physical Therapy

## 2018-02-22 DIAGNOSIS — M6281 Muscle weakness (generalized): Secondary | ICD-10-CM | POA: Insufficient documentation

## 2018-02-22 DIAGNOSIS — M62838 Other muscle spasm: Secondary | ICD-10-CM | POA: Insufficient documentation

## 2018-02-22 DIAGNOSIS — R279 Unspecified lack of coordination: Secondary | ICD-10-CM

## 2018-02-22 DIAGNOSIS — R140 Abdominal distension (gaseous): Secondary | ICD-10-CM

## 2018-02-22 DIAGNOSIS — R1084 Generalized abdominal pain: Secondary | ICD-10-CM

## 2018-02-22 NOTE — Telephone Encounter (Signed)
Patient called to schedule her January 2020 appointment, appointment scheduled, patient states that she has been having a gradual pain that keeps getting worse in her upper abdomen area.  Patient states that she started having pain the week before Thanksgiving and it got worse the Sunday after Thanksgiving.  Patient states that the pain in above her intestines, and it radiates to her back, she states that she does have some swelling in her abdomen.  She states that the pain is worse when she eats., it hurts to breathe at times, and when she rolls over in the bed.  I notified our nurse practitioner Joylene John, NP, a CT scan of the Abd and Pelvis was ordered.  I informed patient that the CT will be done on Monday, December 16th at [facility] at 07:30am.  Patient will come by our office on Friday, December 13th, to pick-up the contrast.  I gave patient instructions for the CT scan prep.  Patient verbalized understanding.

## 2018-02-22 NOTE — Therapy (Signed)
[facility] [address] [place], [place], [address] Phone: [phone]   Fax:  [phone]  Physical Therapy Treatment  Patient Details  Name: Danielle Guzman MRN: [id number] Date of Birth: Nov 15, 1965 Referring Provider (PT): Dr. Alvy Bimler   Encounter Date: 02/22/2018  PT End of Session - 02/22/18 0936    Visit Number  46    Date for PT Re-Evaluation  04/27/18    Authorization Type  self pay due to no more insurance coverage    PT Start Time  0930    PT Stop Time  1000   left early due to abdominal pian   PT Time Calculation (min)  30 min    Activity Tolerance  Patient tolerated treatment well    Behavior During Therapy  [facility] for tasks assessed/performed       Past Medical History:  Diagnosis Date  . Cancer ([place]) 2018   endometrial   . Headache    hx migraines yrs ago  . Malaria as child  . Miscarriage    x 2  . Pneumonia yrs ago  . Seizures ([place])    petite mal seizure in high school x 1, none since  . Sinus infection 08/2017    Past Surgical History:  Procedure Laterality Date  . DENTAL SURGERY     gum graft 20 yrs ago and again on 08/26/16  . DILATATION & CURETTAGE/HYSTEROSCOPY WITH MYOSURE N/A 08/11/2016   Procedure: DILATATION & CURETTAGE/HYSTEROSCOPY WITH MYOSURE;  Surgeon: Jerelyn Charles, MD;  Location: [facility];  Service: Gynecology;  Laterality: N/A;  polyp removal  . IR FLUORO GUIDE PORT INSERTION RIGHT  11/10/2016  . IR REMOVAL TUN ACCESS W/ PORT W/O FL MOD SED  03/11/2017  . IR US GUIDE VASC ACCESS RIGHT  11/10/2016  . LYMPH NODE BIOPSY N/A 09/01/2016   Procedure: SENTINEL LYMPH NODE BIOPSY;  Surgeon: Everitt Amber, MD;  Location: [facility];  Service: Gynecology;  Laterality: N/A;  . ROBOTIC ASSISTED TOTAL HYSTERECTOMY WITH BILATERAL SALPINGO OOPHERECTOMY Bilateral 09/01/2016   Procedure: ROBOTIC ASSISTED TOTAL HYSTERECTOMY WITH BILATERAL SALPINGO OOPHORECTOMY;  Surgeon: Everitt Amber, MD;  Location: [facility];   Service: Gynecology;  Laterality: Bilateral;  . WISDOM TOOTH EXTRACTION      There were no vitals filed for this visit.  Subjective Assessment - 02/22/18 0933    Subjective  I leak when I bend over and going from sit to stand. I have been consistent with my program due to other issues with family. My leakage is a little worse due to not exercising as much. I have alot of pain inmy abdomen. I have pain when I eat and lay down. Pain has started 2 weeks ago. Patient has abdominal pain with eating and breathing and moving. Tenderness in the abdomen to touch. Nothing will decrease the abdominal pain.     Pertinent History  ovarian cancer with complete hysterectomy 09/01/16 and SLNB, completed chemotherapy on 02/23/17    Patient Stated Goals  reduce leakage and pain with intercourse    Currently in Pain?  Yes    Pain Score  8     Pain Location  Vagina    Pain Orientation  Mid    Pain Descriptors / Indicators  Aching    Pain Type  Chronic pain    Pain Onset  More than a month ago    Pain Frequency  Intermittent    Aggravating Factors   penile penetration    Pain Relieving Factors  no intercourse    Multiple Pain Sites  Yes    Pain Score  5    Pain Location  Vagina    Pain Orientation  Mid    Pain Descriptors / Indicators  Aching;Shooting    Pain Onset  More than a month ago    Pain Frequency  Intermittent    Aggravating Factors   intercourse    Pain Relieving Factors  no intercourse         [facility] PT Assessment - 02/22/18 0001      Palpation   Palpation comment  tightness located in the left upper quadrant, suprapubically                   [facility] Adult PT Treatment/Exercise - 02/22/18 0001      Manual Therapy   Manual Therapy  Myofascial release;Soft tissue mobilization    Myofascial Release  along the midline of the abdomen, lower abdominal but patient was having significant sharp pains in the abdomen               PT Short Term Goals - 05/24/17 1054       PT SHORT TERM GOAL #5   Title  ability to have penile penetration with her husband with pain decreased >/= 20% better     Time  8    Period  Weeks    Status  Achieved        PT Long Term Goals - 02/02/18 1101      PT LONG TERM GOAL #6   Title  urinary leakage decreased >/= 75% so she is using 1 light pad per day due to increased pelvic floor strength    Period  Weeks    Status  On-going      PT LONG TERM GOAL #7   Title  able to have penile penetration with >/= 50% decreased in pain    Time  8    Period  Weeks    Status  On-going            Plan - 02/22/18 1007    Clinical Impression Statement  Patient is having significant abdominal pain to touch during treatment so we stopped early. The left upper quadrant by her scar seems more swollen than normal. Patient has been feeling more bloated in the past 2 weeks. Patient is going to call Dr. Denman George to let them know about her symptoms to see if she is to be seen by her. Patient has not been able to do her exercises as much due to some family emergencies. Patient leaks urine with bending forward and sit to stand. Patient has pain with intercourse. Patient will benefit from skilled therapy to improve urinary leakage with sti to stand and reduce pain with intercourse.     Rehab Potential  Excellent    Clinical Impairments Affecting Rehab Potential  pt with increased left knee and right shoulder pain    PT Frequency  Biweekly    PT Duration  Other (comment)   12 weeks   PT Treatment/Interventions  ADLs/Self Care Home Management;Electrical Stimulation;Neuromuscular re-education;Therapeutic exercise;Balance training;Manual techniques;Patient/family education;Therapeutic activities;Dry needling    PT Next Visit Plan  continues with soft tissue work to the vaginal area, work on pelvic floor contraction with sit to stand;See what MD says    PT Home Exercise Plan  progress as needed    Consulted and Agree with Plan of Care  Patient        Patient  will benefit from skilled therapeutic intervention in order to improve the following deficits and impairments:  Decreased balance, Decreased endurance, Decreased mobility, Difficulty walking, Impaired sensation, Decreased strength, Pain, Postural dysfunction, Increased edema, Increased muscle spasms, Increased fascial restricitons  Visit Diagnosis: Other muscle spasm  Unspecified lack of coordination  Muscle weakness (generalized)     Problem List Patient Active Problem List   Diagnosis Date Noted  . Vitamin D deficiency 12/25/2017  . Physical deconditioning 03/25/2017  . Obesity (BMI 30.0-34.9) 03/25/2017  . Drug-induced hyperglycemia 12/23/2016  . Port catheter in place 12/02/2016  . Dysuria 12/02/2016  . 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 Acute rhinitis 11/04/2016  . Goals of care, counseling/discussion 11/04/2016  . 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 Endometrial cancer ([place]) 08/24/2016    Earlie Counts, PT 02/22/18 10:13 AM    [facility] [address] [place], [place], [address] Phone: [phone]   Fax:  [phone]  Name: Danielle Guzman MRN: [id number] Date of Birth: 1965/06/19

## 2018-02-22 NOTE — Progress Notes (Signed)
Patient called the office with complaints of abdominal pain and increased abdominal distension for the past two weeks.  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 CT AP ordered to rule out recurrence.

## 2018-02-23 ENCOUNTER — Encounter: Payer: BLUE CROSS/BLUE SHIELD | Admitting: Physical Therapy

## 2018-02-28 ENCOUNTER — Encounter (HOSPITAL_COMMUNITY): Payer: Self-pay

## 2018-02-28 ENCOUNTER — Telehealth: Payer: Self-pay

## 2018-02-28 ENCOUNTER — Ambulatory Visit (HOSPITAL_COMMUNITY)
Admission: RE | Admit: 2018-02-28 | Discharge: 2018-02-28 | Disposition: A | Discharge status: Home / Self Care | Payer: BLUE CROSS/BLUE SHIELD | Source: Ambulatory Visit | Attending: Gynecologic Oncology | Admitting: Gynecologic Oncology

## 2018-02-28 DIAGNOSIS — R140 Abdominal distension (gaseous): Secondary | ICD-10-CM | POA: Diagnosis not present

## 2018-02-28 DIAGNOSIS — R1084 Generalized abdominal pain: Secondary | ICD-10-CM | POA: Diagnosis present

## 2018-02-28 MED ORDER — IOHEXOL 300 MG/ML  SOLN
100.0000 mL | Freq: Once | INTRAMUSCULAR | Status: AC | PRN
  Administered 2018-02-28: 100 mL via INTRAVENOUS

## 2018-02-28 MED ORDER — SODIUM CHLORIDE (PF) 0.9 % IJ SOLN
INTRAMUSCULAR | Status: AC
  Filled 2018-02-28: qty 50

## 2018-02-28 NOTE — Telephone Encounter (Signed)
Outgoing call to patient regarding recent CT results per Joylene John NP- "no evidence of recurrence, seeing mild compression fracture of spine at T 12, could go to PCP or neurosurgeon."  Pt voiced understanding.  No needs per pt at this time.  Called her number back and let her know that we can send results to her PCP if she would like - no answer, left as VM to call and provide her PCP's name.

## 2018-03-08 ENCOUNTER — Ambulatory Visit: Payer: BLUE CROSS/BLUE SHIELD | Admitting: Physical Therapy

## 2018-03-08 ENCOUNTER — Encounter: Payer: Self-pay | Admitting: Physical Therapy

## 2018-03-08 DIAGNOSIS — M6281 Muscle weakness (generalized): Secondary | ICD-10-CM

## 2018-03-08 DIAGNOSIS — M62838 Other muscle spasm: Secondary | ICD-10-CM | POA: Diagnosis not present

## 2018-03-08 DIAGNOSIS — R279 Unspecified lack of coordination: Secondary | ICD-10-CM

## 2018-03-08 NOTE — Therapy (Signed)
[facility] [address] [place], [place], [address] Phone: [phone]   Fax:  [phone]  Physical Therapy Treatment  Patient Details  Name: Danielle Guzman MRN: [id number] Date of Birth: 23-Jul-1965 Referring Provider (PT): Dr. Alvy Bimler   Encounter Date: 03/08/2018  PT End of Session - 03/08/18 0810    Visit Number  52    Date for PT Re-Evaluation  04/27/18    Authorization Type  self pay due to no more insurance coverage    PT Start Time  0800    PT Stop Time  0840    PT Time Calculation (min)  40 min    Activity Tolerance  Patient tolerated treatment well    Behavior During Therapy  [facility] for tasks assessed/performed       Past Medical History:  Diagnosis Date  . Cancer ([place]) 2018   endometrial   . Headache    hx migraines yrs ago  . Malaria as child  . Miscarriage    x 2  . Pneumonia yrs ago  . 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 Sinus infection 08/2017    Past Surgical History:  Procedure Laterality Date  . DENTAL SURGERY     gum graft 20 yrs ago and again on 08/26/16  . DILATATION & CURETTAGE/HYSTEROSCOPY WITH MYOSURE N/A 08/11/2016   Procedure: DILATATION & CURETTAGE/HYSTEROSCOPY WITH MYOSURE;  Surgeon: Jerelyn Charles, MD;  Location: [facility];  Service: Gynecology;  Laterality: N/A;  polyp removal  . IR FLUORO GUIDE PORT INSERTION RIGHT  11/10/2016  . IR REMOVAL TUN ACCESS W/ PORT W/O FL MOD SED  03/11/2017  . IR US GUIDE VASC ACCESS RIGHT  11/10/2016  . LYMPH NODE BIOPSY N/A 09/01/2016   Procedure: SENTINEL LYMPH NODE BIOPSY;  Surgeon: Everitt Amber, MD;  Location: [facility];  Service: Gynecology;  Laterality: N/A;  . ROBOTIC ASSISTED TOTAL HYSTERECTOMY WITH BILATERAL SALPINGO OOPHERECTOMY Bilateral 09/01/2016   Procedure: ROBOTIC ASSISTED TOTAL HYSTERECTOMY WITH BILATERAL SALPINGO OOPHORECTOMY;  Surgeon: Everitt Amber, MD;  Location: [facility];  Service: Gynecology;  Laterality:  Bilateral;  . WISDOM TOOTH EXTRACTION      There were no vitals filed for this visit.  Subjective Assessment - 03/08/18 0802    Subjective  I had a compression fracture at T12 when the took the MRI. I am seeing a chiropractor who has helped me. This area affects the bladder so my leakage is better. Since last visit the urinary leakage is improved by 50%.     Pertinent History  ovarian cancer with complete hysterectomy 09/01/16 and SLNB, completed chemotherapy on 02/23/17    Patient Stated Goals  reduce leakage and pain with intercourse    Currently in Pain?  Yes    Pain Score  8     Pain Location  Vagina    Pain Orientation  Mid    Pain Descriptors / Indicators  Aching    Pain Type  Chronic pain    Pain Onset  More than a month ago    Pain Frequency  Intermittent    Aggravating Factors   penile penetration    Pain Relieving Factors  no intercourse    Multiple Pain Sites  No                    Pelvic Floor Special Questions - 03/08/18 0001    Pelvic Floor Internal Exam  Patient confirms indentification  and approves PT to assess pelvic floor integrity and treatment    Exam Type  Vaginal    Strength  fair squeeze, definite lift   4/5 for 3 sec 3 times       [facility] Adult PT Treatment/Exercise - 03/08/18 0001      Manual Therapy   Manual Therapy  Internal Pelvic Floor    Myofascial Release  along the outside of the introitus and inner thigh while one finger is in the vaginal canal    Internal Pelvic Floor  worked on the urethra sphincter, along the intoritus, along the posterior fourchette with releasing the tissue               PT Short Term Goals - 05/24/17 1054      PT SHORT TERM GOAL #5   Title  ability to have penile penetration with her husband with pain decreased >/= 20% better     Time  8    Period  Weeks    Status  Achieved        PT Long Term Goals - 02/02/18 1101      PT LONG TERM GOAL #6   Title  urinary leakage decreased >/= 75% so she  is using 1 light pad per day due to increased pelvic floor strength    Period  Weeks    Status  On-going      PT LONG TERM GOAL #7   Title  able to have penile penetration with >/= 50% decreased in pain    Time  8    Period  Weeks    Status  On-going            Plan - 03/08/18 0901    Clinical Impression Statement  The side of the introitus is thicker due to increased muscle mass. Patient does not have the ridge on the posterior vaginal canal now. Patient has a compression fracture at T12. Patient reports her urinary leakage has decreased by 50% since she is having her compression fracture treated by her chiropractor. Patient has stretching of the posterior fourchette. Patient will benefit from skilled therapy to improve urinary leakage with sit to stand and reduce pain with intercourse.     Rehab Potential  Excellent    Clinical Impairments Affecting Rehab Potential  pt with increased left knee and right shoulder pain    PT Frequency  Biweekly    PT Duration  Other (comment)   12 weeks   PT Treatment/Interventions  ADLs/Self Care Home Management;Electrical Stimulation;Neuromuscular re-education;Therapeutic exercise;Balance training;Manual techniques;Patient/family education;Therapeutic activities;Dry needling    PT Next Visit Plan  continues with soft tissue work to the vaginal area, work on pelvic floor contraction with sit to stand;See what MD says    PT Home Exercise Plan  progress as needed    Consulted and Agree with Plan of Care  Patient       Patient will benefit from skilled therapeutic intervention in order to improve the following deficits and impairments:  Decreased balance, Decreased endurance, Decreased mobility, Difficulty walking, Impaired sensation, Decreased strength, Pain, Postural dysfunction, Increased edema, Increased muscle spasms, Increased fascial restricitons  Visit Diagnosis: Other muscle spasm  Unspecified lack of coordination  Muscle weakness  (generalized)     Problem List Patient Active Problem List   Diagnosis Date Noted  . Vitamin D deficiency 12/25/2017  . Physical deconditioning 03/25/2017  . Obesity (BMI 30.0-34.9) 03/25/2017  . Drug-induced hyperglycemia 12/23/2016  . Port catheter in place 12/02/2016  .  Dysuria 12/02/2016  . 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 Acute rhinitis 11/04/2016  . Goals of care, counseling/discussion 11/04/2016  . 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 Endometrial cancer ([place]) 08/24/2016    Earlie Counts, PT 03/08/18 9:14 AM   [facility] [address] [place], [place], [address] Phone: [phone]   Fax:  [phone]  Name: JUANELLE TRUEHEART MRN: [id number] Date of Birth: 21-Jul-1965

## 2018-03-21 ENCOUNTER — Encounter: Payer: Self-pay | Admitting: Physical Therapy

## 2018-03-21 ENCOUNTER — Ambulatory Visit: Payer: BLUE CROSS/BLUE SHIELD | Attending: Hematology and Oncology | Admitting: Physical Therapy

## 2018-03-21 DIAGNOSIS — M6281 Muscle weakness (generalized): Secondary | ICD-10-CM | POA: Diagnosis present

## 2018-03-21 DIAGNOSIS — M62838 Other muscle spasm: Secondary | ICD-10-CM | POA: Diagnosis not present

## 2018-03-21 DIAGNOSIS — R279 Unspecified lack of coordination: Secondary | ICD-10-CM | POA: Insufficient documentation

## 2018-03-21 NOTE — Patient Instructions (Addendum)
[facility] [address], [place], [place] [address] Phone # [phone] Fax [phone] Access Code: [email or web address]  URL: [email or web address]  Date: 03/21/2018  Prepared by: Earlie Counts   Exercises  Supine March - 10 reps - 2 sets - 1x daily - 7x weekly  Supine Transversus Abdominis Bracing with Leg Extension - 10 reps - 1 sets - 1x daily - 7x weekly  Quadruped Transversus Abdominis Bracing - 10 reps - 1 sets - 5 sec hold - 1x daily - 7x weekly  Quadruped Alternating Shoulder Flexion - 10 reps - 2 sets - 1x daily - 7x weekly  Seated Pelvic Floor Contraction - 5 reps - 1 sets - 15 -30 hold - 2x daily - 7x weekly  Standing Forward Toe Taps on Box (BKA) - 10 reps - 1 sets - 1x daily - 7x weekly

## 2018-03-21 NOTE — Therapy (Signed)
[facility] [address], [place], [place], [address] Phone: [phone]   Fax:  [phone]  Physical Therapy Treatment  Patient Details  Name: Danielle Guzman MRN: [id number] Date of Birth: [birth year] Referring Provider (PT): Dr. Alvy Bimler   Encounter Date: 03/21/2018  PT End of Session - 03/21/18 1107    Visit Number  74    Date for PT Re-Evaluation  04/27/18    Authorization Type  new insurance for 2020    Authorization - Visit Number  1    Authorization - Number of Visits  30    PT Start Time  1100    PT Stop Time  1140    PT Time Calculation (min)  40 min    Activity Tolerance  Patient tolerated treatment well    Behavior During Therapy  [facility] for tasks assessed/performed       Past Medical History:  Diagnosis Date  . Cancer ([place]) 2018   endometrial   . Headache    hx migraines yrs ago  . Malaria as child  . Miscarriage    x 2  . Pneumonia yrs ago  . Seizures ([place])    petite mal seizure in high school x 1, none since  . Sinus infection 08/2017    Past Surgical History:  Procedure Laterality Date  . DENTAL SURGERY     gum graft 20 yrs ago and again on 08/26/16  . DILATATION & CURETTAGE/HYSTEROSCOPY WITH MYOSURE N/A 08/11/2016   Procedure: DILATATION & CURETTAGE/HYSTEROSCOPY WITH MYOSURE;  Surgeon: Jerelyn Charles, MD;  Location: [facility];  Service: Gynecology;  Laterality: N/A;  polyp removal  . IR FLUORO GUIDE PORT INSERTION RIGHT  11/10/2016  . IR REMOVAL TUN ACCESS W/ PORT W/O FL MOD SED  03/11/2017  . IR US GUIDE VASC ACCESS RIGHT  11/10/2016  . LYMPH NODE BIOPSY N/A 09/01/2016   Procedure: SENTINEL LYMPH NODE BIOPSY;  Surgeon: Everitt Amber, MD;  [facility];  Service: Gynecology;  Laterality: N/A;  . ROBOTIC ASSISTED TOTAL HYSTERECTOMY WITH BILATERAL SALPINGO OOPHERECTOMY Bilateral 09/01/2016   Procedure: ROBOTIC ASSISTED TOTAL HYSTERECTOMY WITH BILATERAL SALPINGO OOPHORECTOMY;  Surgeon: Everitt Amber, MD;   [facility];  Service: Gynecology;  Laterality: Bilateral;  . WISDOM TOOTH EXTRACTION      There were no vitals filed for this visit.  Subjective Assessment - 03/21/18 1103    Subjective  I will leak after she gets off the toilet and when I am in the shower. I am still working with my pelvic floor exercises. I change pad once in the morning and at night. I have not had intercourse. When I use the larger head on the stretcher I am having pain. I leak at night but 25% since last visit. During the day the leakage is 35% less since last visit and is not random.     Pertinent History  ovarian cancer with complete hysterectomy 09/01/16 and SLNB, completed chemotherapy on 02/23/17    Patient Stated Goals  reduce leakage and pain with intercourse    Currently in Pain?  Yes    Pain Score  8     Pain Location  Vagina    Pain Orientation  Mid    Pain Descriptors / Indicators  Aching    Pain Type  Chronic pain    Pain Onset  More than a month ago    Pain Frequency  Intermittent    Aggravating Factors   penile penetration  Pain Relieving Factors  no intercourse    Multiple Pain Sites  No                       [facility] Adult PT Treatment/Exercise - 03/21/18 0001      Lumbar Exercises: Seated   Other Seated Lumbar Exercises  contract pelvci floor 30 seconds with abdominal bracing,     Other Seated Lumbar Exercises  contract pelvic floor with pelvic floor contraction while leaning forward and contract the abdominals      Lumbar Exercises: Supine   Bent Knee Raise  10 reps;1 second   each leg; pelvic floor contraction   Straight Leg Raise  10 reps;1 second   each leg, contract pelvic floor     Lumbar Exercises: Quadruped   Single Arm Raise  Left;Right;10 reps;1 second   contract the pelvic floor   Other Quadruped Lumbar Exercises  transverse abdominal contraction      Knee/Hip Exercises: Standing   Other Standing Knee Exercises  toe tap forward with pelvic floor contraction  and lower abdominal contraction      Manual Therapy   Manual Therapy  Taping    Kinesiotex  Facilitate Muscle      Kinesiotix   Facilitate Muscle   bil. external and internal oblique             PT Education - 03/21/18 1137    Education provided  Yes    Education Details  Access Code: [facility]     Person(s) Educated  Patient    Methods  Explanation;Demonstration;Verbal cues;Handout    Comprehension  Returned demonstration;Verbalized understanding       PT Short Term Goals - 05/24/17 1054      PT SHORT TERM GOAL #5   Title  ability to have penile penetration with her husband with pain decreased >/= 20% better     Time  8    Period  Weeks    Status  Achieved        PT Long Term Goals - 03/21/18 1328      PT LONG TERM GOAL #6   Title  urinary leakage decreased >/= 75% so she is using 1 light pad per day due to increased pelvic floor strength    Baseline  2 pads, 1 during the day ; reports it is 50% better    Time  8    Period  Weeks    Status  On-going      PT LONG TERM GOAL #7   Title  able to have penile penetration with >/= 50% decreased in pain    Baseline  was able to have full penile penetration with pain    Time  8    Period  Weeks    Status  On-going      PT LONG TERM GOAL #8   Title  Deep ache in the pelvis has decreased >/= 75% due to improved tissue mobility    Time  8    Period  Weeks    Status  Achieved            Plan - 03/21/18 1325    Clinical Impression Statement  Patient leaks when she gets off the toilet and during a shower when she bends forward to wash her lower body. Patient needed taping to the abdominal to facilitate a stronger contraction with her stabilization exercises. Patient will leak urine when she leans forward due to not contracting her lower abdominals  and bulging out. This session focused on core and correct abdominal contraction with pelvic floor conctraction.     Rehab Potential  Excellent    Clinical Impairments  Affecting Rehab Potential  pt with increased left knee and right shoulder pain    PT Frequency  Biweekly    PT Duration  Other (comment)   12 weeks   PT Treatment/Interventions  ADLs/Self Care Home Management;Electrical Stimulation;Neuromuscular re-education;Therapeutic exercise;Balance training;Manual techniques;Patient/family education;Therapeutic activities;Dry needling    PT Next Visit Plan  continues with soft tissue work to the vaginal area, work on pelvic floor contraction with sit to stand, core stabilization    PT Home Exercise Plan  progress as needed       Patient will benefit from skilled therapeutic intervention in order to improve the following deficits and impairments:  Decreased balance, Decreased endurance, Decreased mobility, Difficulty walking, Impaired sensation, Decreased strength, Pain, Postural dysfunction, Increased edema, Increased muscle spasms, Increased fascial restricitons  Visit Diagnosis: Other muscle spasm  Unspecified lack of coordination  Muscle weakness (generalized)     Problem List Patient Active Problem List   Diagnosis Date Noted  . Vitamin D deficiency 12/25/2017  . Physical deconditioning 03/25/2017  . Obesity (BMI 30.0-34.9) 03/25/2017  . Drug-induced hyperglycemia 12/23/2016  . Port catheter in place 12/02/2016  . Dysuria 12/02/2016  . 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 Acute rhinitis 11/04/2016  . Goals of care, counseling/discussion 11/04/2016  . 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 Endometrial cancer ([place]) 08/24/2016    Earlie Counts, PT 03/21/18 1:30 PM   [facility] [address], [place], [place], [address] Phone: [phone]   Fax:  [phone]  Name: SIDRA OLDFIELD MRN: [id number] Date of Birth: 09/15/1965

## 2018-03-29 ENCOUNTER — Telehealth: Payer: Self-pay | Admitting: *Deleted

## 2018-03-29 NOTE — Telephone Encounter (Signed)
Patient called asking "I know Dr. Denman George in the past has checked my Vit D level, and since I haven't found a primary care physician yet I was wondering if she would draw it again. Also I need to see if she would order a bone density test?" Per Melissa APP "Dr. Denman George stated that since we don't treat/follow Vit D and bone density scan, she needs to follow up with a PCP. Since she is looking for a PCP she can try [place] at [employer] the patient the above information and the number to the [place] office.

## 2018-03-30 ENCOUNTER — Encounter: Payer: Self-pay | Admitting: Gynecologic Oncology

## 2018-03-30 ENCOUNTER — Inpatient Hospital Stay: Payer: BLUE CROSS/BLUE SHIELD | Attending: Hematology and Oncology | Admitting: Gynecologic Oncology

## 2018-03-30 VITALS — BP 135/86 | HR 60 | Temp 97.8°F | Resp 20 | Ht 66.0 in | Wt 207.8 lb

## 2018-03-30 DIAGNOSIS — Z90722 Acquired absence of ovaries, bilateral: Secondary | ICD-10-CM | POA: Diagnosis not present

## 2018-03-30 DIAGNOSIS — C541 Malignant neoplasm of endometrium: Secondary | ICD-10-CM | POA: Insufficient documentation

## 2018-03-30 DIAGNOSIS — Z90710 Acquired absence of both cervix and uterus: Secondary | ICD-10-CM | POA: Insufficient documentation

## 2018-03-30 DIAGNOSIS — Z9221 Personal history of antineoplastic chemotherapy: Secondary | ICD-10-CM | POA: Diagnosis not present

## 2018-03-30 DIAGNOSIS — Z87891 Personal history of nicotine dependence: Secondary | ICD-10-CM | POA: Diagnosis not present

## 2018-03-30 MED ORDER — FUROSEMIDE 20 MG PO TABS: 20.0000 mg | ORAL_TABLET | Freq: Every day | ORAL | 3 refills | Status: DC | PRN

## 2018-03-30 MED FILL — FUROSEMIDE 20 MG TABS: 20 | 30 days supply | Qty: 30 | Fill #2

## 2018-03-30 NOTE — Progress Notes (Signed)
Follow-up Note: Gyn-Onc  Consult was requested by Dr. Loletta Specter and Dr Stann Mainland for the evaluation of Danielle Guzman 53 y.o. female  CC:  Chief Complaint  Patient presents with  . Endometrial cancer [facility])    Assessment/Plan:  Danielle Guzman  is a 53 y.o.  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 MSI unstable.   She is s/p staging surgery on 09/01/16. And s/p adjuvat carboplatin and paclitaxel x 6 cycles between 11/12/2016 - 02/23/17.   No evidence of recurrence on exam. Continue 3 monthly surveillance visits until December, 2020.  Declined genetics consultation despite loss of MSH6 on IHC and strong family history of cancer  .   T12 compression fraction - improved symptoms with chiropractor. If persistent at next exam in April, will order repeat CT scan.   Pelvic floor pain with levator spasm -  continue therapy with PT.   HPI: Danielle Guzman is a 53 year old [id number] who is seen in consultation at the request of Dr Stann Mainland and Dr Jerelyn Charles for endometrial cancer.  The patient reports early menopause at age 46 (all of her female relatives have had early menopause). She began experiencing postmenopausal bleeding in March 2018. She was seen by Dr Stann Mainland on 06/02/16 and a TVUS showed an endometrium of 1.8cm with a 3cm hypervascular polyp.  She was taken to the OR on 08/11/16 for a hysteroscopy/D&C with Dr Jerelyn Charles and the specimen showed endometrial adenocarcinoma with the majority of the specimen revealing grade 2 endometrioid endometrial cancer with a small focus (5%) of clear cell carcinoma and a few foci suspicious for serous carcinoma.  She is otherwise very healthy. She has had no prior surgeries or vaginal deliveries.  Her mother has a history of breast cancer x 3, she has a sister with a history of cervical cancer. Her maternal aunt has a history of breast cancer. A different maternal aunt had uterine and colon cancer. Her maternal  cousin had uterine cancer. Her paternal grandmother had a diagnosis of colon cancer and her paternal cousins had hysterectomies for "abnromal cells" but no cancer.  Interval Hx: On 09/01/16 she underwent robotic assisted total hysterectomy, BSO, SLN biopsy. Surgery was uncomplicated. The final pathology revealed a 1.8cm grade 2 endometrioid endometrial cancer ( with rare foci of squamous differentiation and a rare microscopic focus of serous differentiation). There was luminal foci of carcinoma within the right and left fallopian tubes and the right fallopian tube lumen involvement showed a focus of similar appearing chondroid stroma with adjacent focus of serous carcinoma.  The lymph nodes and cervix were negative for carcinoma. The uterine tumor showed no myometrial invasion and no LVSI. Final pathology revealed loss of MSH6 on IHC.   Declined genetics consultation.  Postoperatively she completed 6 cycles of carboplatin and paclitaxel between 11/12/17-12/11/9 after experiencing some trepidation regarding toxicity overwhelming benefits.   She has some persistent foot neuropathy post-chemotherapy and has some bilateral lower extremity edema. She see PT for neuropathy and pelvic pains.  Her friend recently died from advanced uterine cancer.  She has fatigue, weight gain and pelvic flood pain/dyspareunia.   She has back and abdominal pain which prompted a CT abd/pelvis on February 28, 2018.  This showed mild superior endplate compression fracture deformity of T12 which was new from 2018, no retropulsion.  There is no evidence of recurrent or metastatic disease.  She has been seeing a chiropractor for this and feels  that this is improved her symptoms substantially.  Current Meds:  Outpatient Encounter Medications as of 03/30/2018  Medication Sig  . APPLE CIDER VINEGAR PO Take 1 Dose by mouth daily. Patient states she takes Gaffer 1tsp. Daily.  . Ascorbic Acid (VITAMIN C PO) Take 1  tablet by mouth 3 (three) times daily as needed (for immune health support or cold symptoms.).  Marland Kitchen b complex vitamins tablet Take 1 tablet by mouth daily.  . calcium citrate (CALCITRATE - DOSED IN MG ELEMENTAL CALCIUM) 950 MG tablet Take 200 mg of elemental calcium by mouth daily. Patient states she takes 6 pills a day.  . furosemide (LASIX) 20 MG tablet Take 1 tablet (20 mg total) by mouth daily as needed.  . Omega-3 Fatty Acids (FISH OIL) 1000 MG CAPS Take 1 capsule by mouth daily.   Marland Kitchen OVER THE COUNTER MEDICATION Take 1 tablet by mouth daily. Tumeric once a day.  . Probiotic Product (PROBIOTIC DAILY PO) Take 1 capsule by mouth daily.  . Vitamin D, Ergocalciferol, (DRISDOL) 50000 units CAPS capsule Take 1 capsule (50,000 Units total) by mouth every 7 (seven) days.  . [DISCONTINUED] cholecalciferol (VITAMIN D) 1000 units tablet Take 10,000 Units by mouth daily. Patient states that she takes 10,000 units a day.   No facility-administered encounter medications on file as of 03/30/2018.     Allergy:  Allergies  Allergen Reactions  . Latex     Long exposure causes a rash  . Tamiflu  [Oseltamivir Phosphate] Nausea Only    Social Hx:   Social History   Socioeconomic History  . Marital status: Married    Spouse name: [name]  . Number of children: 0  . Years of education: Not on file  . Highest education level: Not on file  Occupational History  . Occupation: massage therapist  Social Needs  . Financial resource strain: Not on file  . Food insecurity:    Worry: Not on file    Inability: Not on file  . Transportation needs:    Medical: Not on file    Non-medical: Not on file  Tobacco Use  . Smoking status: Former Smoker    Packs/day: 1.50    Years: 8.00    Pack years: 12.00    Types: Cigarettes  . Smokeless tobacco: Never Used  . Tobacco comment: quit smoking at age 18  Substance and Sexual Activity  . Alcohol use: Yes    Alcohol/week: 4.0 standard drinks    Types: 4  Glasses of wine per week    Comment: wine occ  . Drug use: No  . Sexual activity: Yes    Birth control/protection: Post-menopausal  Lifestyle  . Physical activity:    Days per week: Not on file    Minutes per session: Not on file  . Stress: Not on file  Relationships  . Social connections:    Talks on phone: Not on file    Gets together: Not on file    Attends religious service: Not on file    Active member of club or organization: Not on file    Attends meetings of clubs or organizations: Not on file    Relationship status: Not on file  . Intimate partner violence:    Fear of current or ex partner: Not on file    Emotionally abused: Not on file    Physically abused: Not on file    Forced sexual activity: Not on file  Other Topics Concern  .  Not on file  Social History Narrative  . Not on file    Past Surgical Hx:  Past Surgical History:  Procedure Laterality Date  . DENTAL SURGERY     gum graft 20 yrs ago and again on 08/26/16  . DILATATION & CURETTAGE/HYSTEROSCOPY WITH MYOSURE N/A 08/11/2016   Procedure: DILATATION & CURETTAGE/HYSTEROSCOPY WITH MYOSURE;  Surgeon: Jerelyn Charles, MD;  Location: [facility];  Service: Gynecology;  Laterality: N/A;  polyp removal  . IR FLUORO GUIDE PORT INSERTION RIGHT  11/10/2016  . IR REMOVAL TUN ACCESS W/ PORT W/O FL MOD SED  03/11/2017  . IR US GUIDE VASC ACCESS RIGHT  11/10/2016  . LYMPH NODE BIOPSY N/A 09/01/2016   Procedure: SENTINEL LYMPH NODE BIOPSY;  Surgeon: Everitt Amber, MD;  Location: [facility];  Service: Gynecology;  Laterality: N/A;  . ROBOTIC ASSISTED TOTAL HYSTERECTOMY WITH BILATERAL SALPINGO OOPHERECTOMY Bilateral 09/01/2016   Procedure: ROBOTIC ASSISTED TOTAL HYSTERECTOMY WITH BILATERAL SALPINGO OOPHORECTOMY;  Surgeon: Everitt Amber, MD;  Location: [facility];  Service: Gynecology;  Laterality: Bilateral;  . WISDOM TOOTH EXTRACTION      Past Medical Hx:  Past Medical History:  Diagnosis Date  . Cancer ([place]) 2018   endometrial   .  Compression fx, lumbar spine (HCC)    Compression Fracture T12  . Headache    hx migraines yrs ago  . Malaria as child  . Miscarriage    x 2  . Pneumonia yrs ago  . Seizures ([place])    petite mal seizure in high school x 1, none since  . Sinus infection 08/2017    Past Gynecological History:  Premature menopause No LMP recorded. Patient has had a hysterectomy.  Family Hx:  Family History  Problem Relation Age of Onset  . Cancer Mother   . Cancer Sister        Cervix  . Cancer Maternal Aunt        Breast, uterine  . Cancer Paternal Grandmother        Colon    Review of Systems:  Constitutional  Feels well,    ENT Normal appearing ears and nares bilaterally Skin/Breast  No rash, sores, jaundice, itching, dryness Cardiovascular  No chest pain, shortness of breath, or edema  Pulmonary  No cough or wheeze.  Gastro Intestinal  No nausea, vomitting, or diarrhoea. No bright red blood per rectum, no abdominal pain, change in bowel movement, or constipation.  Genito Urinary  No frequency, urgency, dysuria, no bleeding Musculo Skeletal  + bilateral LE edema Neurologic  + foot neuropathy bilaterally  Psychology  No depression, anxiety, insomnia.   Vitals:  Blood pressure 135/86, pulse 60, temperature 97.8 F (36.6 C), temperature source Oral, resp. rate 20, height _0  (1.676 m), weight 207 lb 12.8 oz (94.3 kg), SpO2 100 %.  Physical Exam: WD in NAD Neck  Supple NROM, without any enlargements.  Lymph Node Survey No cervical supraclavicular or inguinal adenopathy Cardiovascular  Pulse normal rate, regularity and rhythm. S1 and S2 normal.  Lungs  Clear to auscultation bilateraly, without wheezes/crackles/rhonchi. Good air movement.  Skin  No rash/lesions/breakdown  Psychiatry  Alert and oriented to person, place, and time  Abdomen  Normoactive bowel sounds, abdomen soft, non-tender and obese without evidence of hernia. Soft incisions.  Back No CVA  tenderness Genito Urinary  : vaginal cuff with 2 small fronds of granulation tissue. No other lesions or masses. + levator spasm on right with associated tenderness.  Rectal  deferred Extremities  No bilateral  cyanosis, clubbing or edema.  Thereasa Solo, MD  03/30/2018, 4:52 PM

## 2018-03-30 NOTE — Patient Instructions (Signed)
Please notify Dr Denman George at phone number [phone] if you notice vaginal bleeding, new pelvic or abdominal pains, bloating, feeling full easy, or a change in bladder or bowel function.   Please return to see Dr Denman George in April, 2020.  Dr Denman George has refilled your lasix medication.

## 2018-04-04 ENCOUNTER — Encounter: Payer: Self-pay | Admitting: Physical Therapy

## 2018-04-04 ENCOUNTER — Ambulatory Visit: Payer: BLUE CROSS/BLUE SHIELD | Admitting: Physical Therapy

## 2018-04-04 DIAGNOSIS — M62838 Other muscle spasm: Secondary | ICD-10-CM

## 2018-04-04 DIAGNOSIS — M6281 Muscle weakness (generalized): Secondary | ICD-10-CM

## 2018-04-04 DIAGNOSIS — R279 Unspecified lack of coordination: Secondary | ICD-10-CM

## 2018-04-04 NOTE — Therapy (Signed)
[facility] [address], [place], [place], [address] Phone: [phone]   Fax:  [phone]  Physical Therapy Treatment  Patient Details  Name: Danielle Guzman MRN: [id number] Date of Birth: October 30, 1965 Referring Provider (PT): Dr. Alvy Bimler   Encounter Date: 04/04/2018  PT End of Session - 04/04/18 1023    Visit Number  [id number]    Date for PT Re-Evaluation  04/27/18    Authorization Type  new insurance for 2020    Authorization - Visit Number  2    Authorization - Number of Visits  30    PT Start Time  1024   came late   PT Stop Time  1100    PT Time Calculation (min)  36 min    Activity Tolerance  Patient tolerated treatment well    Behavior During Therapy  [facility] for tasks assessed/performed       Past Medical History:  Diagnosis Date  . Cancer ([place]) 2018   endometrial   . Compression fx, lumbar spine (HCC)    Compression Fracture T12  . Headache    hx migraines yrs ago  . Malaria as child  . Miscarriage    x 2  . Pneumonia yrs ago  . Seizures ([place])    petite mal seizure in high school x 1, none since  . Sinus infection 08/2017    Past Surgical History:  Procedure Laterality Date  . DENTAL SURGERY     gum graft 20 yrs ago and again on 08/26/16  . DILATATION & CURETTAGE/HYSTEROSCOPY WITH MYOSURE N/A 08/11/2016   Procedure: DILATATION & CURETTAGE/HYSTEROSCOPY WITH MYOSURE;  Surgeon: Jerelyn Charles, MD;  Location: [facility];  Service: Gynecology;  Laterality: N/A;  polyp removal  . IR FLUORO GUIDE PORT INSERTION RIGHT  11/10/2016  . IR REMOVAL TUN ACCESS W/ PORT W/O FL MOD SED  03/11/2017  . IR US GUIDE VASC ACCESS RIGHT  11/10/2016  . LYMPH NODE BIOPSY N/A 09/01/2016   Procedure: SENTINEL LYMPH NODE BIOPSY;  Surgeon: Everitt Amber, MD;  Location: [facility];  Service: Gynecology;  Laterality: N/A;  . ROBOTIC ASSISTED TOTAL HYSTERECTOMY WITH BILATERAL SALPINGO OOPHERECTOMY Bilateral 09/01/2016   Procedure: ROBOTIC ASSISTED  TOTAL HYSTERECTOMY WITH BILATERAL SALPINGO OOPHORECTOMY;  Surgeon: Everitt Amber, MD;  Location: [facility];  Service: Gynecology;  Laterality: Bilateral;  . WISDOM TOOTH EXTRACTION      There were no vitals filed for this visit.  Subjective Assessment - 04/04/18 1024    Subjective  I still have some leakage and pad is dryer. Intercourse is still painfull. He can go in almost fully. I hit a point that is feels almost it is going to tear.     Pertinent History  ovarian cancer with complete hysterectomy 09/01/16 and SLNB, completed chemotherapy on 02/23/17    Patient Stated Goals  reduce leakage and pain with intercourse    Currently in Pain?  Yes    Pain Score  8     Pain Location  Vagina    Pain Orientation  Mid    Pain Descriptors / Indicators  Burning    Pain Type  Chronic pain    Pain Onset  1 to 4 weeks ago    Pain Frequency  Intermittent    Aggravating Factors   penile penetration     Pain Relieving Factors  no intercourse    Multiple Pain Sites  No  Pelvic Floor Special Questions - 04/04/18 0001    Pelvic Floor Internal Exam  Patient confirms indentification and approves PT to assess pelvic floor integrity and treatment    Exam Type  Vaginal    Strength  fair squeeze, definite lift   4/5 for 3 sec 3 times       [facility] Adult PT Treatment/Exercise - 04/04/18 0001      Self-Care   Self-Care  Other Self-Care Comments    Other Self-Care Comments   dicussed with patient on using an ohnut for deep penetrative sex      Manual Therapy   Manual Therapy  Internal Pelvic Floor    Internal Pelvic Floor  along the sphincter muscle, bulbocavernosus, left coccygeus and ischiococcygeus; myofascial release to the anterior wall above the bladder but the pubovessical ligaments.              PT Education - 04/04/18 1103    Education provided  Yes    Education Details  ohnut for deep penetration intercourse    Person(s) Educated  Patient    Methods   Explanation;Handout    Comprehension  Verbalized understanding       PT Short Term Goals - 05/24/17 1054      PT SHORT TERM GOAL #5   Title  ability to have penile penetration with her husband with pain decreased >/= 20% better     Time  8    Period  Weeks    Status  Achieved        PT Long Term Goals - 04/04/18 1115      PT LONG TERM GOAL #1   Title  Pt will be independent in a home exercise program for continued strengthening and stretching    Baseline  still learning    Time  8    Period  Weeks    Status  Achieved      PT LONG TERM GOAL #6   Title  urinary leakage decreased >/= 75% so she is using 1 light pad per day due to increased pelvic floor strength    Baseline  2 pads, 1 during the day ; reports it is 50% better    Time  8    Period  Weeks    Status  On-going      PT LONG TERM GOAL #7   Title  able to have penile penetration with >/= 50% decreased in pain    Baseline  was able to have full penile penetration with pain    Time  8    Period  Weeks    Status  On-going            Plan - 04/04/18 1024    Clinical Impression Statement  Patient is leaking but is less. Patient is able to tolerate the penis being inserted. Patient reports pain increases to 7/10 when deeper and by the top of the vagina. Patient has restrictions in the area. Patient has a trigger point in the ishiococcyges and coccygeus. Patient has come a long way and is working hard at home. Patient will benefit from skilled therapy to reduce trigger points and elongate tissue for pelvic floor contraction and reduce pain with penile penetration.     Rehab Potential  Excellent    Clinical Impairments Affecting Rehab Potential  pt with increased left knee and right shoulder pain    PT Frequency  Biweekly    PT Duration  Other (comment)   12 weeks  PT Treatment/Interventions  ADLs/Self Care Home Management;Electrical Stimulation;Neuromuscular re-education;Therapeutic exercise;Balance  training;Manual techniques;Patient/family education;Therapeutic activities;Dry needling    PT Next Visit Plan  continues with soft tissue work to the vaginal area, work on pelvic floor contraction with sit to stand, core stabilization    PT Home Exercise Plan  progress as needed    Recommended Other Services  MD signed all notes    Consulted and Agree with Plan of Care  Patient       Patient will benefit from skilled therapeutic intervention in order to improve the following deficits and impairments:  Decreased balance, Decreased endurance, Decreased mobility, Difficulty walking, Impaired sensation, Decreased strength, Pain, Postural dysfunction, Increased edema, Increased muscle spasms, Increased fascial restricitons  Visit Diagnosis: Other muscle spasm  Unspecified lack of coordination  Muscle weakness (generalized)     Problem List Patient Active Problem List   Diagnosis Date Noted  . Vitamin D deficiency 12/25/2017  . Physical deconditioning 03/25/2017  . Obesity (BMI 30.0-34.9) 03/25/2017  . Drug-induced hyperglycemia 12/23/2016  . Port catheter in place 12/02/2016  . Dysuria 12/02/2016  . 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 Acute rhinitis 11/04/2016  . Goals of care, counseling/discussion 11/04/2016  . 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 Endometrial cancer ([place]) 08/24/2016    Earlie Counts, PT 04/04/18 11:16 AM   [facility] [address], [place], [place], [address] Phone: [phone]   Fax:  [phone]  Name: Danielle Guzman MRN: [id number] Date of Birth: 05-18-1965

## 2018-04-04 NOTE — Patient Instructions (Addendum)
      [phone] [email or web address]  [facility] [address] [place], [place] [address] Phone # [phone] Fax [phone]

## 2018-04-19 ENCOUNTER — Encounter: Payer: BLUE CROSS/BLUE SHIELD | Admitting: Physical Therapy

## 2018-04-19 ENCOUNTER — Ambulatory Visit: Payer: BLUE CROSS/BLUE SHIELD | Attending: Hematology and Oncology | Admitting: Physical Therapy

## 2018-04-19 ENCOUNTER — Encounter: Payer: Self-pay | Admitting: Physical Therapy

## 2018-04-19 DIAGNOSIS — R279 Unspecified lack of coordination: Secondary | ICD-10-CM | POA: Diagnosis present

## 2018-04-19 DIAGNOSIS — M6281 Muscle weakness (generalized): Secondary | ICD-10-CM | POA: Insufficient documentation

## 2018-04-19 DIAGNOSIS — M62838 Other muscle spasm: Secondary | ICD-10-CM | POA: Diagnosis present

## 2018-04-19 NOTE — Patient Instructions (Addendum)
Moisturizers . They are used in the vagina to hydrate the mucous membrane that make up the vaginal canal. . Designed to keep a more normal acid balance (ph) . Once placed in the vagina, it will last between two to three days.  . Use 2-3 times per week at bedtime and last longer than 60 min. . Ingredients to avoid is glycerin and fragrance, can increase chance of infection . Should not be used just before sex due to causing irritation . Most are gels administered either in a tampon-shaped applicator or as a vaginal suppository. They are non-hormonal.   Types of Moisturizers . Samul Dada- drug store . Vitamin E vaginal suppositories- Whole foods, Amazon . Moist Again . Coconut oil- can break down condoms . Julva- (Do no use if on Tamoxifen) amazon . Yes moisturizer- amazon . NeuEve Silk , NeuEve Silver for menopausal or over 65 (if have severe vaginal atrophy or cancer treatments use NeuEve Silk for  1 month than move to The Pepsi)- [employer], MapleFlower.dk . Olive and Bee intimate cream- www.oliveandbee.com.au . Mae vaginal moisturizer- Amazon Enchanted [place] on [employer].intimaterose.come Creams to use externally on the Vulva area  Albertson's (good for for cancer patients that had radiation to the area)- [place] or [employer].FlyingBasics.com.br  V-magic cream - amazon  Julva-amazon  Vital "V Wild Yam salve ( help moisturize and help with thinning vulvar area, does have Fountain by Damiva labial moisturizer ([place],    Things to avoid in the vaginal area . Do not use things to irritate the vulvar area . No lotions just specialized creams for the vulva area- Neogyn, V-magic, No soaps; can use Aveeno or Calendula cleanser if needed. Must be gentle . No deodorants . No douches . Good to sleep without underwear to let the vaginal area to air out . No scrubbing: spread the lips to let warm water rinse  over labias and pat dry .  http://www.payne.com/  Sit to Stand: Phase 1    Sitting, squeeze pelvic floor and hold. Lean trunk forward. Place hands on lower abdominals to facilitate correct lower abdominal contraction.  Repeat _5__ times. Do _1-_2_ times a day.  Copyright  VHI. All rights reserved.    Squat    Standing, squat, squeeze pelvic floor then stand. Keep hands on lower abdomen to facilitate lower abdominal muscles. Repeat _5__ times. Do __1_ times a day.  Copyright  VHI. All rights reserved.  Bracing With Forward Lunge (Standing)    Stand with hands on hips. Find neutral spine. Tighten pelvic floor and abdominals and hold. Alternating legs, step forward and bend knee to lower trunk. Repeat _2__ times. Do _1__ times a day.  Copyright  VHI. All rights reserved.  Strengthening: Wall Push-Up    With arms slightly wider apart than shoulder width, and feet _12___ inches from wall, gently lean body toward wall. Repeat __10__ times per set. Do __1__ sets per session. Do _1___ sessions per day. Or on counter http://orth.exer.us/905   Copyright  VHI. All rights reserved.  [facility] [address] [place], [place] [address] Phone # [phone] Fax [phone]

## 2018-04-20 NOTE — Therapy (Signed)
[facility] [address], [place], [place], [address] Phone: [phone]   Fax:  [phone]  Physical Therapy Treatment  Patient Details  Name: Danielle Guzman MRN: [id number] Date of Birth: [birth year] Referring Provider (PT): Dr. Alvy Bimler   Encounter Date: 04/19/2018  PT End of Session - 04/19/18 1445    Visit Number  35    Date for PT Re-Evaluation  08/26/18    Authorization Type  new insurance for 2020    Authorization - Visit Number  3    PT Start Time  0102    PT Stop Time  1525    PT Time Calculation (min)  40 min    Activity Tolerance  Patient tolerated treatment well    Behavior During Therapy  [facility] for tasks assessed/performed       Past Medical History:  Diagnosis Date  . Cancer ([place]) 2018   endometrial   . Compression fx, lumbar spine (HCC)    Compression Fracture T12  . Headache    hx migraines yrs ago  . Malaria as child  . Miscarriage    x 2  . Pneumonia yrs ago  . Seizures ([place])    petite mal seizure in high school x 1, none since  . Sinus infection 08/2017    Past Surgical History:  Procedure Laterality Date  . DENTAL SURGERY     gum graft 20 yrs ago and again on 08/26/16  . DILATATION & CURETTAGE/HYSTEROSCOPY WITH MYOSURE N/A 08/11/2016   Procedure: DILATATION & CURETTAGE/HYSTEROSCOPY WITH MYOSURE;  Surgeon: Jerelyn Charles, MD;  Location: [facility];  Service: Gynecology;  Laterality: N/A;  polyp removal  . IR FLUORO GUIDE PORT INSERTION RIGHT  11/10/2016  . IR REMOVAL TUN ACCESS W/ PORT W/O FL MOD SED  03/11/2017  . IR US GUIDE VASC ACCESS RIGHT  11/10/2016  . LYMPH NODE BIOPSY N/A 09/01/2016   Procedure: SENTINEL LYMPH NODE BIOPSY;  Surgeon: Everitt Amber, MD;  Location: [facility];  Service: Gynecology;  Laterality: N/A;  . ROBOTIC ASSISTED TOTAL HYSTERECTOMY WITH BILATERAL SALPINGO OOPHERECTOMY Bilateral 09/01/2016   Procedure: ROBOTIC ASSISTED TOTAL HYSTERECTOMY WITH BILATERAL SALPINGO OOPHORECTOMY;   Surgeon: Everitt Amber, MD;  Location: [facility];  Service: Gynecology;  Laterality: Bilateral;  . WISDOM TOOTH EXTRACTION      There were no vitals filed for this visit.  Subjective Assessment - 04/19/18 1446    Subjective  No intercourse due to being away. I have not done my wand or eggs. The pad at night down to 1/3 full. Pad during the day I have had to change my pad 2 times per day. I leak when I get up from the floor. I am doing better with getting up and down from a chair.     Pertinent History  ovarian cancer with complete hysterectomy 09/01/16 and SLNB, completed chemotherapy on 02/23/17    Patient Stated Goals  reduce leakage and pain with intercourse    Currently in Pain?  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 Alvy Bimler    Onset Date/Surgical Date  09/01/17    Hand Dominance  Right    Prior Therapy  no pelvic floor therapy      Precautions   Precautions  Other (comment)    Precaution Comments  at risk for LE lymphedema      Restrictions   Weight Bearing Restrictions  No      [place] residence      Prior Function   Level of Independence  Independent      Cognition   Overall Cognitive Status  Within Functional Limits for tasks assessed      ROM / Strength   AROM / PROM / Strength  AROM;PROM;Strength      Strength   Right Hip Flexion  4+/5    Right Hip ABduction  3+/5    Right Hip ADduction  5/5    Left Hip Flexion  4/5    Left Hip ABduction  4-/5    Left Hip ADduction  4/5                 Pelvic Floor Special Questions - 04/20/18 0001    Urinary Leakage  --                PT Education - 04/19/18 1529    Education provided  Yes    Education Details  information on vaginal moisturizers, pelvic floor exercise    Person(s) Educated  Patient    Methods  Explanation;Demonstration;Verbal cues;Handout    Comprehension  Returned demonstration;Verbalized understanding       PT Short Term Goals - 05/24/17 1054      PT SHORT TERM GOAL #5   Title  ability to have penile penetration with her husband with pain decreased >/= 20% better     Time  8    Period  Weeks    Status  Achieved        PT Long Term Goals - 04/19/18 0809      PT LONG TERM GOAL #1   Title  Pt will be independent in a home exercise program for continued strengthening and stretching    Time  8    Period  Weeks    Status  Achieved      PT LONG TERM GOAL #6   Title  urinary leakage decreased >/= 75% so she is using 1 light pad per day due to increased pelvic floor strength    Baseline  2 pads, 1 during the day ; reports it is 50% better    Time  8    Period  Weeks    Status  On-going      PT LONG TERM GOAL #7   Title  able to have penile penetration with >/= 50% decreased in pain    Time  8    Period  Weeks    Status  On-going      PT LONG TERM GOAL #8   Title  Deep ache in the pelvis has decreased >/= 75% due to improved tissue mobility    Baseline  pain level more consistent with 1/10 and 80% better    Time  8    Period  Weeks    Status  Achieved  Plan - 04/19/18 0800    Clinical Impression Statement  Patient has decreased endurance of pelvic floor muscles. Patient can engage pelvic floor muscles better when she is able to contract her lower abdominals better. Patient continues to have weakness in the lower abdominals. Patient has not had intercourse in the past 3 weeks due to her schedule. Patient has had pain in the past due to the restriction  in the initial entrance. Patient wears 2 pads per day and 1 pad per night. Patient pads are 50% dryer. Patient pelvic floor contraction to 8 uv for 5 seconds. Patient will leak urine with bending forward to sit up and gettin gup from the floor. Patient will benefit from skilled therapy to reduce trigger points and elongate tissue for pelvic floor contraction and reduce pain with penile penetration.     Rehab Potential  Excellent    Clinical Impairments Affecting Rehab Potential  pt with increased left knee and right shoulder pain    PT Frequency  Monthy    PT Duration  Other (comment)   4 months   PT Treatment/Interventions  ADLs/Self Care Home Management;Electrical Stimulation;Neuromuscular re-education;Therapeutic exercise;Balance training;Manual techniques;Patient/family education;Therapeutic activities;Dry needling    PT Next Visit Plan  continues with soft tissue work to the vaginal area, work on pelvic floor contraction with sit to stand, core stabilization; use biofeedback    Recommended Other Services  sent MD renewal on 04/20/2018    Consulted and Agree with Plan of Care  Patient       Patient will benefit from skilled therapeutic intervention in order to improve the following deficits and impairments:  Decreased balance, Decreased endurance, Decreased mobility, Difficulty walking, Impaired sensation, Decreased strength, Pain, Postural dysfunction, Increased edema, Increased muscle spasms, Increased fascial restricitons  Visit Diagnosis: Other muscle spasm  Unspecified lack of coordination  Muscle weakness (generalized)     Problem List Patient Active Problem List   Diagnosis Date Noted  . Vitamin D deficiency 12/25/2017  . Physical deconditioning 03/25/2017  . Obesity (BMI 30.0-34.9) 03/25/2017  . Drug-induced hyperglycemia 12/23/2016  . Port catheter in place 12/02/2016  . Dysuria 12/02/2016  . 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 Acute rhinitis  11/04/2016  . Goals of care, counseling/discussion 11/04/2016  . 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 Endometrial cancer ([place]) 08/24/2016    Earlie Counts, PT 04/20/18 8:10 AM    [facility] [address] [place], [place], [address] Phone: [phone]   Fax:  [phone]  Name: ARALI SOMERA MRN: [id number] Date of Birth: [birth year]

## 2018-05-03 ENCOUNTER — Encounter: Payer: BLUE CROSS/BLUE SHIELD | Admitting: Physical Therapy

## 2018-05-17 ENCOUNTER — Encounter: Payer: Self-pay | Admitting: Physical Therapy

## 2018-05-17 ENCOUNTER — Ambulatory Visit: Payer: BLUE CROSS/BLUE SHIELD | Attending: Hematology and Oncology | Admitting: Physical Therapy

## 2018-05-17 DIAGNOSIS — R279 Unspecified lack of coordination: Secondary | ICD-10-CM | POA: Insufficient documentation

## 2018-05-17 DIAGNOSIS — M62838 Other muscle spasm: Secondary | ICD-10-CM

## 2018-05-17 DIAGNOSIS — M6281 Muscle weakness (generalized): Secondary | ICD-10-CM | POA: Diagnosis present

## 2018-05-17 NOTE — Therapy (Signed)
[facility] [address], [place], [place], [address] Phone: [phone]   Fax:  [phone]  Physical Therapy Treatment  Patient Details  Name: Danielle Guzman MRN: [id number] Date of Birth: 03-11-1966 Referring Provider (PT): Dr. Alvy Bimler   Encounter Date: 05/17/2018  PT End of Session - 05/17/18 0929    Visit Number  47    Date for PT Re-Evaluation  08/26/18    Authorization Type  new insurance for 2020    Authorization - Visit Number  4    Authorization - Number of Visits  30    PT Start Time  0800    PT Stop Time  0840    PT Time Calculation (min)  40 min    Activity Tolerance  Patient tolerated treatment well    Behavior During Therapy  [facility] for tasks assessed/performed       Past Medical History:  Diagnosis Date  . Cancer ([place]) 2018   endometrial   . Compression fx, lumbar spine (HCC)    Compression Fracture T12  . Headache    hx migraines yrs ago  . Malaria as child  . Miscarriage    x 2  . Pneumonia yrs ago  . Seizures ([place])    petite mal seizure in high school x 1, none since  . Sinus infection 08/2017    Past Surgical History:  Procedure Laterality Date  . DENTAL SURGERY     gum graft 20 yrs ago and again on 08/26/16  . DILATATION & CURETTAGE/HYSTEROSCOPY WITH MYOSURE N/A 08/11/2016   Procedure: DILATATION & CURETTAGE/HYSTEROSCOPY WITH MYOSURE;  Surgeon: Jerelyn Charles, MD;  Location: [facility];  Service: Gynecology;  Laterality: N/A;  polyp removal  . IR FLUORO GUIDE PORT INSERTION RIGHT  11/10/2016  . IR REMOVAL TUN ACCESS W/ PORT W/O FL MOD SED  03/11/2017  . IR US GUIDE VASC ACCESS RIGHT  11/10/2016  . LYMPH NODE BIOPSY N/A 09/01/2016   Procedure: SENTINEL LYMPH NODE BIOPSY;  Surgeon: Everitt Amber, MD;  Location: [facility];  Service: Gynecology;  Laterality: N/A;  . ROBOTIC ASSISTED TOTAL HYSTERECTOMY WITH BILATERAL SALPINGO OOPHERECTOMY Bilateral 09/01/2016   Procedure: ROBOTIC ASSISTED TOTAL  HYSTERECTOMY WITH BILATERAL SALPINGO OOPHORECTOMY;  Surgeon: Everitt Amber, MD;  Location: [facility];  Service: Gynecology;  Laterality: Bilateral;  . WISDOM TOOTH EXTRACTION      There were no vitals filed for this visit.  Subjective Assessment - 05/17/18 0805    Subjective  I am still getting better with the leakage. I still have pain with intercourse. It is not the ripping pain. The pain is posterior and is 1.5 inches upward. I am working it with the therawand. When the penis head  goes in and out I am almost to the point of ripping.     Pertinent History  ovarian cancer with complete hysterectomy 09/01/16 and SLNB, completed chemotherapy on 02/23/17    Patient Stated Goals  reduce leakage and pain with intercourse    Currently in Pain?  Yes    Pain Score  8     Pain Location  Vagina    Pain Orientation  Mid    Pain Descriptors / Indicators  Burning    Pain Type  Chronic pain    Pain Onset  1 to 4 weeks ago    Pain Frequency  Intermittent    Aggravating Factors   penile penetration    Pain Relieving Factors  no intercourse    Multiple Pain  Sites  No                    Pelvic Floor Special Questions - 05/17/18 0001    Pelvic Floor Internal Exam  Patient confirms indentification and approves PT to assess pelvic floor integrity and treatment    Exam Type  Vaginal    Strength  fair squeeze, definite lift   tighter contraction       [facility] Adult PT Treatment/Exercise - 05/17/18 0001      Neuro Re-ed    Neuro Re-ed Details   reclined and standing with bulging of the pelvic floor using a mirror and tactile cues to assist in pelvic bulge; sit to stand with therapist finger in the vaginal canal to work on circular contraction of the pelvic floor and lumbar positions in midrange of sit to stand and back      Manual Therapy   Manual Therapy  Internal Pelvic Floor    Internal Pelvic Floor  superior transverse perineum, ischiocavernosus, perineal body with elongation of tissue and  in right sidely             PT Education - 05/17/18 0928    Education provided  Yes    Education Details  bulging the pelvic floor in sitting and standing with a mirror, sit to stand in beginning range with pelvic floor contraction    Person(s) Educated  Patient    Methods  Explanation;Demonstration    Comprehension  Verbalized understanding;Returned demonstration       PT Short Term Goals - 05/24/17 1054      PT SHORT TERM GOAL #5   Title  ability to have penile penetration with her husband with pain decreased >/= 20% better     Time  8    Period  Weeks    Status  Achieved        PT Long Term Goals - 04/19/18 0809      PT LONG TERM GOAL #1   Title  Pt will be independent in a home exercise program for continued strengthening and stretching    Time  8    Period  Weeks    Status  Achieved      PT LONG TERM GOAL #6   Title  urinary leakage decreased >/= 75% so she is using 1 light pad per day due to increased pelvic floor strength    Baseline  2 pads, 1 during the day ; reports it is 50% better    Time  8    Period  Weeks    Status  On-going      PT LONG TERM GOAL #7   Title  able to have penile penetration with >/= 50% decreased in pain    Time  8    Period  Weeks    Status  On-going      PT LONG TERM GOAL #8   Title  Deep ache in the pelvis has decreased >/= 75% due to improved tissue mobility    Baseline  pain level more consistent with 1/10 and 80% better    Time  8    Period  Weeks    Status  Achieved            Plan - 05/17/18 0929    Clinical Impression Statement  Patient is down to 1 pad per day. Patient has less leakage with sit to stand and bending over. Patient posterior pelvic floor was not contracting as well but  after therapy was able to move through the full excursion. Patient understands how to elongate and contract the pelvic floor in reclined and standing postion. Patient will benefit from skilled therapy to reduce trigger points and  elongate tissue for pelvic floor contraction and reduce pain with penile penetration.     Rehab Potential  Excellent    Clinical Impairments Affecting Rehab Potential  pt with increased left knee and right shoulder pain    PT Frequency  Monthy    PT Duration  Other (comment)   4 months   PT Treatment/Interventions  ADLs/Self Care Home Management;Electrical Stimulation;Neuromuscular re-education;Therapeutic exercise;Balance training;Manual techniques;Patient/family education;Therapeutic activities;Dry needling    PT Next Visit Plan  continues with soft tissue work to the vaginal area, work on pelvic floor contraction with sit to stand, core stabilizatio    PT Home Exercise Plan  progress as needed    Recommended Other Services  MD signed renewal    Consulted and Agree with Plan of Care  Patient       Patient will benefit from skilled therapeutic intervention in order to improve the following deficits and impairments:  Decreased balance, Decreased endurance, Decreased mobility, Difficulty walking, Impaired sensation, Decreased strength, Pain, Postural dysfunction, Increased edema, Increased muscle spasms, Increased fascial restricitons  Visit Diagnosis: Other muscle spasm  Unspecified lack of coordination  Muscle weakness (generalized)     Problem List Patient Active Problem List   Diagnosis Date Noted  . Vitamin D deficiency 12/25/2017  . Physical deconditioning 03/25/2017  . Obesity (BMI 30.0-34.9) 03/25/2017  . Drug-induced hyperglycemia 12/23/2016  . Port catheter in place 12/02/2016  . Dysuria 12/02/2016  . 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 Acute rhinitis 11/04/2016  . Goals of care, counseling/discussion 11/04/2016  . 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 Endometrial cancer ([place]) 08/24/2016    Earlie Counts, PT 05/17/18 10:50 AM   [facility] [address] [place], [place], [address] Phone: [phone]   Fax:  [phone]  Name: ANNTIONETTE MADKINS MRN: [id number] Date of Birth: 1965/05/25

## 2018-06-10 ENCOUNTER — Telehealth: Payer: Self-pay

## 2018-06-10 NOTE — Telephone Encounter (Signed)
LM for Danielle Guzman to call the office back to reschedule her 07-01-18 appointment.

## 2018-06-13 ENCOUNTER — Telehealth: Payer: Self-pay | Admitting: *Deleted

## 2018-06-13 NOTE — Telephone Encounter (Signed)
Called and left the patient a message to call the office back. Need to move her appt from 4/17 due to COVID-19

## 2018-06-14 ENCOUNTER — Encounter: Payer: BLUE CROSS/BLUE SHIELD | Admitting: Physical Therapy

## 2018-06-14 ENCOUNTER — Telehealth: Payer: Self-pay | Admitting: *Deleted

## 2018-06-14 NOTE — Telephone Encounter (Signed)
Called and moved her appt from 4/174 to 6/12 due to COVID-19.

## 2018-06-23 ENCOUNTER — Ambulatory Visit: Payer: BLUE CROSS/BLUE SHIELD | Admitting: Physical Therapy

## 2018-06-28 ENCOUNTER — Encounter: Payer: Self-pay | Admitting: Hematology and Oncology

## 2018-07-01 ENCOUNTER — Ambulatory Visit: Payer: BLUE CROSS/BLUE SHIELD | Admitting: Gynecologic Oncology

## 2018-07-07 ENCOUNTER — Encounter: Payer: BLUE CROSS/BLUE SHIELD | Admitting: Physical Therapy

## 2018-07-13 ENCOUNTER — Encounter: Payer: BLUE CROSS/BLUE SHIELD | Admitting: Physical Therapy

## 2018-08-04 ENCOUNTER — Encounter: Payer: Self-pay | Admitting: Physical Therapy

## 2018-08-04 ENCOUNTER — Ambulatory Visit: Payer: BLUE CROSS/BLUE SHIELD | Attending: Hematology and Oncology | Admitting: Physical Therapy

## 2018-08-04 ENCOUNTER — Other Ambulatory Visit: Payer: Self-pay

## 2018-08-04 DIAGNOSIS — M6281 Muscle weakness (generalized): Secondary | ICD-10-CM | POA: Diagnosis present

## 2018-08-04 DIAGNOSIS — R279 Unspecified lack of coordination: Secondary | ICD-10-CM | POA: Diagnosis present

## 2018-08-04 DIAGNOSIS — M62838 Other muscle spasm: Secondary | ICD-10-CM | POA: Insufficient documentation

## 2018-08-04 NOTE — Therapy (Signed)
[facility] [address] [place], [place], [address] Phone: [phone]   Fax:  [phone]  Physical Therapy Treatment  Patient Details  Name: Danielle Guzman MRN: [id number] Date of Birth: [birth year] Referring Provider (PT): Dr. Alvy Bimler   Encounter Date: 08/04/2018  PT End of Session - 08/04/18 1257    Visit Number  [id number]    Date for PT Re-Evaluation  08/26/18    Authorization Type  new insurance for 2020    Authorization Time Period  insurance will not pay for more visits. Patient is paying out of pocket    Authorization - Visit Number  5    Authorization - Number of Visits  30    PT Start Time  1252    PT Stop Time  1344    PT Time Calculation (min)  52 min    Activity Tolerance  Patient tolerated treatment well    Behavior During Therapy  WFL for tasks assessed/performed       Past Medical History:  Diagnosis Date  . Cancer ([place]) 2018   endometrial   . Compression fx, lumbar spine (HCC)    Compression Fracture T12  . Headache    hx migraines yrs ago  . Malaria as child  . Miscarriage    x 2  . Pneumonia yrs ago  . Seizures ([place])    petite mal seizure in high school x 1, none since  . Sinus infection 08/2017    Past Surgical History:  Procedure Laterality Date  . DENTAL SURGERY     gum graft 20 yrs ago and again on 08/26/16  . DILATATION & CURETTAGE/HYSTEROSCOPY WITH MYOSURE N/A 08/11/2016   Procedure: DILATATION & CURETTAGE/HYSTEROSCOPY WITH MYOSURE;  Surgeon: Jerelyn Charles, MD;  Location: [facility];  Service: Gynecology;  Laterality: N/A;  polyp removal  . IR FLUORO GUIDE PORT INSERTION RIGHT  11/10/2016  . IR REMOVAL TUN ACCESS W/ PORT W/O FL MOD SED  03/11/2017  . IR US GUIDE VASC ACCESS RIGHT  11/10/2016  . LYMPH NODE BIOPSY N/A 09/01/2016   Procedure: SENTINEL LYMPH NODE BIOPSY;  Surgeon: Everitt Amber, MD;  Location: [facility];  Service: Gynecology;  Laterality: N/A;  . ROBOTIC ASSISTED TOTAL HYSTERECTOMY  WITH BILATERAL SALPINGO OOPHERECTOMY Bilateral 09/01/2016   Procedure: ROBOTIC ASSISTED TOTAL HYSTERECTOMY WITH BILATERAL SALPINGO OOPHORECTOMY;  Surgeon: Everitt Amber, MD;  Location: [facility];  Service: Gynecology;  Laterality: Bilateral;  . WISDOM TOOTH EXTRACTION      There were no vitals filed for this visit.  Subjective Assessment - 08/04/18 1258    Subjective  Urinary leakage since last visit is 50% better. I thought it will be more since I do so many exercises. I have had more nights that I do not go without underwear. I wear 1 pad per day. Sit to stand is when I leak. I am able to hold the larger egg but not the smaller one. I have pain with intercourse. I have increased pain when going in and coming out.     Pertinent History  ovarian cancer with complete hysterectomy 09/01/16 and SLNB, completed chemotherapy on 02/23/17    Patient Stated Goals  reduce leakage and pain with intercourse    Currently in Pain?  Yes    Pain Score  8     Pain Location  Vagina    Pain Orientation  Posterior;Mid    Pain Descriptors / Indicators  Burning    Pain Type  Chronic pain  Pain Onset  1 to 4 weeks ago    Pain Frequency  Intermittent    Aggravating Factors   intial and pulling out penile penetration    Pain Relieving Factors  no intercourse    Multiple Pain Sites  No                    Pelvic Floor Special Questions - 08/04/18 0001    Skin Integrity  Erthema;Irritaion present at   posterior fourchette; showed patient the redness   Pelvic Floor Internal Exam  Patient confirms indentification and approves PT to assess pelvic floor integrity and treatment    Exam Type  Vaginal    Palpation  tenderness in the posterior vulva area    Strength  fair squeeze, definite lift   increased contraction posteriorly       [facility] Adult PT Treatment/Exercise - 08/04/18 0001      Self-Care   Self-Care  Other Self-Care Comments    Other Self-Care Comments   education on cream to reduce redness  on the vulva area      Neuro Re-ed    Neuro Re-ed Details   pelvic floor contraction with tapping and elongation to improve posterior wall contraction       Manual Therapy   Manual Therapy  Internal Pelvic Floor    Internal Pelvic Floor  perineal body, bulbocavernosus, ishiocavernosus, superior transverse perineum, along the lateral pelvic floor muscles       Trigger Point Dry Needling - 08/04/18 0001    Consent Given?  Yes    Other Dry Needling  perineal body, ischiocavernousus, bulbocavernosus   trigger point response, elongation of tissue          PT Education - 08/04/18 1718    Education provided  Yes    Education Details  cream for the vaginal area to reduce the redness in the vulva area    Person(s) Educated  Patient    Methods  Explanation;Handout    Comprehension  Verbalized understanding       PT Short Term Goals - 05/24/17 1054      PT SHORT TERM GOAL #5   Title  ability to have penile penetration with her husband with pain decreased >/= 20% better     Time  8    Period  Weeks    Status  Achieved        PT Long Term Goals - 04/19/18 0809      PT LONG TERM GOAL #1   Title  Pt will be independent in a home exercise program for continued strengthening and stretching    Time  8    Period  Weeks    Status  Achieved      PT LONG TERM GOAL #6   Title  urinary leakage decreased >/= 75% so she is using 1 light pad per day due to increased pelvic floor strength    Baseline  2 pads, 1 during the day ; reports it is 50% better    Time  8    Period  Weeks    Status  On-going      PT LONG TERM GOAL #7   Title  able to have penile penetration with >/= 50% decreased in pain    Time  8    Period  Weeks    Status  On-going      PT LONG TERM GOAL #8   Title  Deep ache in the pelvis has decreased >/=  75% due to improved tissue mobility    Baseline  pain level more consistent with 1/10 and 80% better    Time  8    Period  Weeks    Status  Achieved             Plan - 08/04/18 1305    Clinical Impression Statement  Patient is now wearing 1 pad per day and sometimes does not wear underwear at night. Patient has pain with penile penetration initally and with the penis sliding in and out at the posterior fourchette. The posterior fourchette is red and tender to touch with a q-tip. Patient has tightness in the posterior pelvic floor and perineal body. After manaual work she has improve circular contraction and elongation of muscle. Patient will benefit from skilled therpay to reduce trigger points and elongate tissue for pelvic floor contraction and reduce pain with penile penetration.     Rehab Potential  Excellent    Clinical Impairments Affecting Rehab Potential  pt with increased left knee and right shoulder pain    PT Frequency  Monthy    PT Duration  Other (comment)   4 months   PT Treatment/Interventions  ADLs/Self Care Home Management;Electrical Stimulation;Neuromuscular re-education;Therapeutic exercise;Balance training;Manual techniques;Patient/family education;Therapeutic activities;Dry needling    PT Next Visit Plan  continues with soft tissue work to the vaginal area, work on pelvic floor contraction with sit to stand, dry needling,     PT Home Exercise Plan  progress as needed    Consulted and Agree with Plan of Care  Patient       Patient will benefit from skilled therapeutic intervention in order to improve the following deficits and impairments:  Decreased balance, Decreased endurance, Decreased mobility, Difficulty walking, Impaired sensation, Decreased strength, Pain, Postural dysfunction, Increased edema, Increased muscle spasms, Increased fascial restricitons  Visit Diagnosis: Other muscle spasm  Unspecified lack of coordination  Muscle weakness (generalized)     Problem List Patient Active Problem List   Diagnosis Date Noted  . Vitamin D deficiency 12/25/2017  . Physical deconditioning 03/25/2017  . Obesity  (BMI 30.0-34.9) 03/25/2017  . Drug-induced hyperglycemia 12/23/2016  . Port catheter in place 12/02/2016  . Dysuria 12/02/2016  . 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 Acute rhinitis 11/04/2016  . Goals of care, counseling/discussion 11/04/2016  . 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 Endometrial cancer ([place]) 08/24/2016    Earlie Counts, PT 08/04/18 5:21 PM   [facility] [address], [place], [place], [address] Phone: [phone]   Fax:  [phone]  Name: Danielle Guzman MRN: [id number] Date of Birth: 1965/12/22

## 2018-08-04 NOTE — Patient Instructions (Signed)
Moisturizers . They are used in the vagina to hydrate the mucous membrane that make up the vaginal canal. . Designed to keep a more normal acid balance (ph) . Once placed in the vagina, it will last between two to three days.  . Use 2-3 times per week at bedtime and last longer than 60 min. . Ingredients to avoid is glycerin and fragrance, can increase chance of infection . Should not be used just before sex due to causing irritation . Most are gels administered either in a tampon-shaped applicator or as a vaginal suppository. They are non-hormonal.   Types of Moisturizers . Samul Dada- drug store . Vitamin E vaginal suppositories- Whole foods, Amazon . Moist Again . Coconut oil- can break down condoms . Julva- (Do no use if on Tamoxifen) amazon . Yes moisturizer- amazon . NeuEve Silk , NeuEve Silver for menopausal or over 65 (if have severe vaginal atrophy or cancer treatments use NeuEve Silk for  1 month than move to [employer] Pepsi)- [employer], MapleFlower.dk . Olive and Bee intimate cream- www.oliveandbee.com.au . Mae vaginal moisturizer- Amazon  Creams to use externally on the Vulva area  Albertson's (good for for cancer patients that had radiation to the area)- [place] or [employer].FlyingBasics.com.br  V-magic cream - amazon  Julva-amazon  Vital "V Wild Yam salve ( help moisturize and help with thinning vulvar area, does have [place] by Damiva labial moisturizer ([place],    Things to avoid in the vaginal area . Do not use things to irritate the vulvar area . No lotions just specialized creams for the vulva area- Neogyn, V-magic, No soaps; can use Aveeno or Calendula cleanser if needed. Must be gentle . No deodorants . No douches . Good to sleep without underwear to let the vaginal area to air out . No scrubbing: spread the lips to let warm water rinse over labias and pat dry [facility] [address], [place], [place] [address] Phone # [phone] . Fax [phone]

## 2018-08-26 ENCOUNTER — Inpatient Hospital Stay: Payer: BC Managed Care – PPO | Attending: Gynecologic Oncology | Admitting: Gynecologic Oncology

## 2018-08-26 ENCOUNTER — Other Ambulatory Visit: Payer: Self-pay

## 2018-08-26 ENCOUNTER — Encounter: Payer: Self-pay | Admitting: Gynecologic Oncology

## 2018-08-26 ENCOUNTER — Other Ambulatory Visit: Payer: Self-pay | Admitting: Gynecologic Oncology

## 2018-08-26 VITALS — BP 127/56 | HR 59 | Temp 98.5°F | Resp 18 | Ht 66.0 in | Wt 211.6 lb

## 2018-08-26 DIAGNOSIS — C541 Malignant neoplasm of endometrium: Secondary | ICD-10-CM | POA: Diagnosis not present

## 2018-08-26 DIAGNOSIS — N941 Unspecified dyspareunia: Secondary | ICD-10-CM | POA: Insufficient documentation

## 2018-08-26 DIAGNOSIS — Z9221 Personal history of antineoplastic chemotherapy: Secondary | ICD-10-CM | POA: Diagnosis not present

## 2018-08-26 DIAGNOSIS — B373 Candidiasis of vulva and vagina: Secondary | ICD-10-CM

## 2018-08-26 DIAGNOSIS — Z90722 Acquired absence of ovaries, bilateral: Secondary | ICD-10-CM | POA: Diagnosis not present

## 2018-08-26 DIAGNOSIS — N952 Postmenopausal atrophic vaginitis: Secondary | ICD-10-CM | POA: Insufficient documentation

## 2018-08-26 DIAGNOSIS — Z90710 Acquired absence of both cervix and uterus: Secondary | ICD-10-CM | POA: Diagnosis not present

## 2018-08-26 DIAGNOSIS — R32 Unspecified urinary incontinence: Secondary | ICD-10-CM | POA: Diagnosis not present

## 2018-08-26 DIAGNOSIS — N949 Unspecified condition associated with female genital organs and menstrual cycle: Secondary | ICD-10-CM | POA: Insufficient documentation

## 2018-08-26 DIAGNOSIS — B3731 Acute candidiasis of vulva and vagina: Secondary | ICD-10-CM

## 2018-08-26 MED ORDER — FLUCONAZOLE 100 MG PO TABS: 150.0000 mg | ORAL_TABLET | Freq: Once | ORAL | 1 refills | Status: AC

## 2018-08-26 MED ORDER — PREMARIN 0.625 MG/GM VA CREA: 1.0000 | TOPICAL_CREAM | VAGINAL | 12 refills | Status: DC

## 2018-08-26 MED FILL — FLUCONAZOLE 100 MG TAB: 100 | 1 days supply | Qty: 2 | Fill #0

## 2018-08-26 NOTE — Patient Instructions (Addendum)
Please notify Dr Denman George at phone number [phone] if you notice vaginal bleeding, new pelvic or abdominal pains, bloating, feeling full easy, or a change in bladder or bowel function.   Please return to see Dr Denman George in September and again in December. At that time we can space your visits to every 6 months if there is no evidence of cancer.   Dr Denman George prescribed a dose (1.5 tablets) of diflucan to take once for your yeast symptoms. If they recur you can try monistat over the counter. The diflucan is taken by mouth. The monistat is used directly in the vagina. She prescribed vaginal estrogen cream to use at night 3 times per week before bed.

## 2018-08-26 NOTE — Progress Notes (Signed)
Follow-up Note: Gyn-Onc  Consult was requested by Dr. Loletta Specter and Dr Stann Mainland for the evaluation of Danielle Guzman 53 y.o. female  CC:  Chief Complaint  Patient presents with  . endometrial cancer    follow-up    Assessment/Plan:  Danielle Guzman  is a 53 y.o.  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 MSI unstable.   She is s/p staging surgery on 09/01/16. And s/p adjuvat carboplatin and paclitaxel x 6 cycles between 11/12/2016 - 02/23/17.   No evidence of recurrence on exam. Continue 3 monthly surveillance visits until December, 2020.  Declined genetics consultation despite loss of MSH6 on IHC and strong family history of cancer  .   T12 compression fraction - improved symptoms with chiropractor. If persistent at next exam in April, will order repeat CT scan.   Pelvic floor pain with levator spasm -  continue therapy with PT.   Vulvovaginal candidiasis - Rx for diflucan and advice regarding monistat use. Vulvovaginal atrophy - Rx provided for vaginal premarin 3 times per week. I discussed that estrogen replacement has been tested in women with a history of endometrial cancer and this was not associated with an increased risk for recurrent disease.   HPI: Danielle Guzman is a 53 year old [id number] who is seen in consultation at the request of Dr Stann Mainland and Dr Jerelyn Charles for endometrial cancer.  The patient reports early menopause at age 48 (all of her female relatives have had early menopause). She began experiencing postmenopausal bleeding in March 2018. She was seen by Dr Stann Mainland on 06/02/16 and a TVUS showed an endometrium of 1.8cm with a 3cm hypervascular polyp.  She was taken to the OR on 08/11/16 for a hysteroscopy/D&C with Dr Jerelyn Charles and the specimen showed endometrial adenocarcinoma with the majority of the specimen revealing grade 2 endometrioid endometrial cancer with a small focus (5%) of clear cell carcinoma and a few foci  suspicious for serous carcinoma.  She is otherwise very healthy. She has had no prior surgeries or vaginal deliveries.  Her mother has a history of breast cancer x 3, she has a sister with a history of cervical cancer. Her maternal aunt has a history of breast cancer. A different maternal aunt had uterine and colon cancer. Her maternal cousin had uterine cancer. Her paternal grandmother had a diagnosis of colon cancer and her paternal cousins had hysterectomies for "abnromal cells" but no cancer.  On 09/01/16 she underwent robotic assisted total hysterectomy, BSO, SLN biopsy. Surgery was uncomplicated. The final pathology revealed a 1.8cm grade 2 endometrioid endometrial cancer ( with rare foci of squamous differentiation and a rare microscopic focus of serous differentiation). There was luminal foci of carcinoma within the right and left fallopian tubes and the right fallopian tube lumen involvement showed a focus of similar appearing chondroid stroma with adjacent focus of serous carcinoma.  The lymph nodes and cervix were negative for carcinoma. The uterine tumor showed no myometrial invasion and no LVSI. Final pathology revealed loss of MSH6 on IHC.   Declined genetics consultation despite counseling that she may have Lynch syndrome which increases her risk for other cancers (that might be detected early or prevented with more frequent screenings that can be facilitated with this diagnosis).  Postoperatively she completed 6 cycles of carboplatin and paclitaxel between 11/12/17-12/11/9 after experiencing some trepidation regarding toxicity overwhelming benefits.   She has some persistent foot neuropathy post-chemotherapy and has some  bilateral lower extremity edema. She has seen PT for neuropathy and pelvic pains.  Her friend recently died from advanced uterine cancer.  She has back and abdominal pain which prompted a CT abd/pelvis on February 28, 2018.  This showed mild superior endplate  compression fracture deformity of T12 which was new from 2018, no retropulsion.  There is no evidence of recurrent or metastatic disease.  She had been seeing a chiropractor for this and feels that this is improved her symptoms substantially.  Interval Hx: Symptoms persist of dyspareunia, vaginal atrophy, urinary incontinence. She was noted to have red vulvar tissues by her pelvic floor PT. She has mild vaginal itch.   We discussed survivorship issues including long term toxicities of chemotherapy and menopause, we discussed sexual dysfunction, we discussed emotional well-being, we discussed non-gyn cancer screening.   Current Meds:  Outpatient Encounter Medications as of 08/26/2018  Medication Sig  . APPLE CIDER VINEGAR PO Take 1 Dose by mouth daily. Patient states she takes Apple Cider Vinegar 1tsp. Daily.  . Ascorbic Acid (VITAMIN C PO) Take 1 tablet by mouth 3 (three) times daily as needed (for immune health support or cold symptoms.).  . b complex vitamins tablet Take 1 tablet by mouth daily.  . calcium citrate (CALCITRATE - DOSED IN MG ELEMENTAL CALCIUM) 950 MG tablet Take 200 mg of elemental calcium by mouth daily. Patient states she takes 6 pills a day.  . furosemide (LASIX) 20 MG tablet Take 1 tablet (20 mg total) by mouth daily as needed.  . Omega-3 Fatty Acids (FISH OIL) 1000 MG CAPS Take 1 capsule by mouth daily.   . OVER THE COUNTER MEDICATION Take 1 tablet by mouth daily. Tumeric once a day.  . Probiotic Product (PROBIOTIC DAILY PO) Take 1 capsule by mouth daily.  . Vitamin D, Ergocalciferol, (DRISDOL) 50000 units CAPS capsule Take 1 capsule (50,000 Units total) by mouth every 7 (seven) days.   No facility-administered encounter medications on file as of 08/26/2018.     Allergy:  Allergies  Allergen Reactions  . Latex     Long exposure causes a rash  . Tamiflu  [Oseltamivir Phosphate] Nausea Only    Social Hx:   Social History   Socioeconomic History  . Marital  status: Married    Spouse name: Donald "Donnie"  . Number of children: 0  . Years of education: Not on file  . Highest education level: Not on file  Occupational History  . Occupation: massage therapist  Social Needs  . Financial resource strain: Not on file  . Food insecurity    Worry: Not on file    Inability: Not on file  . Transportation needs    Medical: Not on file    Non-medical: Not on file  Tobacco Use  . Smoking status: Former Smoker    Packs/day: 1.50    Years: 8.00    Pack years: 12.00    Types: Cigarettes  . Smokeless tobacco: Never Used  . Tobacco comment: quit smoking at age 23  Substance and Sexual Activity  . Alcohol use: Yes    Alcohol/week: 4.0 standard drinks    Types: 4 Glasses of wine per week    Comment: wine occ  . Drug use: No  . Sexual activity: Yes    Birth control/protection: Post-menopausal  Lifestyle  . Physical activity    Days per week: Not on file    Minutes per session: Not on file  . Stress: Not on file  Relationships  .   Social Herbalist on phone: Not on file    Gets together: Not on file    Attends religious service: Not on file    Active member of club or organization: Not on file    Attends meetings of clubs or organizations: Not on file    Relationship status: Not on file  . Intimate partner violence    Fear of current or ex partner: Not on file    Emotionally abused: Not on file    Physically abused: Not on file    Forced sexual activity: Not on file  Other Topics Concern  . Not on file  Social History Narrative  . Not on file    Past Surgical Hx:  Past Surgical History:  Procedure Laterality Date  . DENTAL SURGERY     gum graft 20 yrs ago and again on 08/26/16  . DILATATION & CURETTAGE/HYSTEROSCOPY WITH MYOSURE N/A 08/11/2016   Procedure: DILATATION & CURETTAGE/HYSTEROSCOPY WITH MYOSURE;  Surgeon: Jerelyn Charles, MD;  Location: [facility];  Service: Gynecology;  Laterality: N/A;  polyp removal  . IR FLUORO  GUIDE PORT INSERTION RIGHT  11/10/2016  . IR REMOVAL TUN ACCESS W/ PORT W/O FL MOD SED  03/11/2017  . IR US GUIDE VASC ACCESS RIGHT  11/10/2016  . LYMPH NODE BIOPSY N/A 09/01/2016   Procedure: SENTINEL LYMPH NODE BIOPSY;  Surgeon: Everitt Amber, MD;  Location: [facility];  Service: Gynecology;  Laterality: N/A;  . ROBOTIC ASSISTED TOTAL HYSTERECTOMY WITH BILATERAL SALPINGO OOPHERECTOMY Bilateral 09/01/2016   Procedure: ROBOTIC ASSISTED TOTAL HYSTERECTOMY WITH BILATERAL SALPINGO OOPHORECTOMY;  Surgeon: Everitt Amber, MD;  Location: [facility];  Service: Gynecology;  Laterality: Bilateral;  . WISDOM TOOTH EXTRACTION      Past Medical Hx:  Past Medical History:  Diagnosis Date  . Cancer ([place]) 2018   endometrial   . Compression fx, lumbar spine (HCC)    Compression Fracture T12  . Headache    hx migraines yrs ago  . Malaria as child  . Miscarriage    x 2  . Pneumonia yrs ago  . Seizures ([place])    petite mal seizure in high school x 1, none since  . Sinus infection 08/2017    Past Gynecological History:  Premature menopause No LMP recorded. Patient has had a hysterectomy.  Family Hx:  Family History  Problem Relation Age of Onset  . Cancer Mother   . Cancer Sister        Cervix  . Cancer Maternal Aunt        Breast, uterine  . Cancer Paternal Grandmother        Colon    Review of Systems:  Constitutional  Feels well,    ENT Normal appearing ears and nares bilaterally Skin/Breast  No rash, sores, jaundice, itching, dryness Cardiovascular  No chest pain, shortness of breath, or edema  Pulmonary  No cough or wheeze.  Gastro Intestinal  No nausea, vomitting, or diarrhoea. No bright red blood per rectum, no abdominal pain, change in bowel movement, or constipation.  Genito Urinary  No frequency, urgency, dysuria, no bleeding Musculo Skeletal  + bilateral LE edema Neurologic  + foot neuropathy bilaterally  Psychology  No depression, anxiety, insomnia.   Vitals:  There were no  vitals taken for this visit.  Physical Exam: WD in NAD Neck  Supple NROM, without any enlargements.  Lymph Node Survey No cervical supraclavicular or inguinal adenopathy Cardiovascular  Pulse normal rate, regularity and rhythm. S1 and S2  normal.  Lungs  Clear to auscultation bilateraly, without wheezes/crackles/rhonchi. Good air movement.  Skin  No rash/lesions/breakdown  Psychiatry  Alert and oriented to person, place, and time  Abdomen  Normoactive bowel sounds, abdomen soft, non-tender and obese without evidence of hernia. Soft incisions.  Back No CVA tenderness Genito Urinary  : vaginal cuff normal with no lesions. No masses. + levator spasm on right with associated tenderness. There was some erythema of the introitus and cuff consistent with vulvovaginal candidasis. Thin atrophic vaginal tissues.  Rectal  deferred Extremities  No bilateral cyanosis, clubbing or edema.  Thereasa Solo, MD  08/26/2018, 3:17 PM

## 2018-08-30 ENCOUNTER — Encounter: Payer: Self-pay | Admitting: Physical Therapy

## 2018-08-30 ENCOUNTER — Other Ambulatory Visit: Payer: Self-pay

## 2018-08-30 ENCOUNTER — Ambulatory Visit: Payer: BC Managed Care – PPO | Attending: Hematology and Oncology | Admitting: Physical Therapy

## 2018-08-30 DIAGNOSIS — M62838 Other muscle spasm: Secondary | ICD-10-CM | POA: Insufficient documentation

## 2018-08-30 DIAGNOSIS — R279 Unspecified lack of coordination: Secondary | ICD-10-CM | POA: Diagnosis present

## 2018-08-30 DIAGNOSIS — M6281 Muscle weakness (generalized): Secondary | ICD-10-CM | POA: Insufficient documentation

## 2018-08-30 NOTE — Patient Instructions (Addendum)
   [facility] [address], [address] [place], [place] [address] Phone # [phone] Fax [phone]  

## 2018-08-30 NOTE — Therapy (Signed)
[facility] [address], [place], [place], [address] Phone: [phone]   Fax:  [phone]  Physical Therapy Treatment  Patient Details  Name: Danielle Guzman MRN: [id number] Date of Birth: [birth year] Referring Provider (PT): Dr. Alvy Bimler   Encounter Date: 08/30/2018  PT End of Session - 08/30/18 0856    Visit Number  42    Date for PT Re-Evaluation  08/26/18    Authorization Type  new insurance for 2020    Authorization Time Period  insurance will not pay for more visits. Patient is paying out of pocket    Authorization - Visit Number  6    Authorization - Number of Visits  30    PT Start Time  0800    PT Stop Time  0851    PT Time Calculation (min)  51 min    Activity Tolerance  Patient tolerated treatment well    Behavior During Therapy  WFL for tasks assessed/performed       Past Medical History:  Diagnosis Date  . Cancer ([place]) 2018   endometrial   . Compression fx, lumbar spine (HCC)    Compression Fracture T12  . Headache    hx migraines yrs ago  . Malaria as child  . Miscarriage    x 2  . Pneumonia yrs ago  . 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 Sinus infection 08/2017    Past Surgical History:  Procedure Laterality Date  . DENTAL SURGERY     gum graft 20 yrs ago and again on 08/26/16  . DILATATION & CURETTAGE/HYSTEROSCOPY WITH MYOSURE N/A 08/11/2016   Procedure: DILATATION & CURETTAGE/HYSTEROSCOPY WITH MYOSURE;  Surgeon: Jerelyn Charles, MD;  Location: [facility];  Service: Gynecology;  Laterality: N/A;  polyp removal  . IR FLUORO GUIDE PORT INSERTION RIGHT  11/10/2016  . IR REMOVAL TUN ACCESS W/ PORT W/O FL MOD SED  03/11/2017  . IR US GUIDE VASC ACCESS RIGHT  11/10/2016  . LYMPH NODE BIOPSY N/A 09/01/2016   Procedure: SENTINEL LYMPH NODE BIOPSY;  Surgeon: Everitt Amber, MD;  Location: [facility];  Service: Gynecology;  Laterality: N/A;  . ROBOTIC ASSISTED TOTAL HYSTERECTOMY  WITH BILATERAL SALPINGO OOPHERECTOMY Bilateral 09/01/2016   Procedure: ROBOTIC ASSISTED TOTAL HYSTERECTOMY WITH BILATERAL SALPINGO OOPHORECTOMY;  Surgeon: Everitt Amber, MD;  Location: [facility];  Service: Gynecology;  Laterality: Bilateral;  . WISDOM TOOTH EXTRACTION      There were no vitals filed for this visit.  Subjective Assessment - 08/30/18 0805    Subjective  The doctor put me on premarin and thinks I may have Candida. I had alot of itching in the vaginal area after dry needling.    Pertinent History  ovarian cancer with complete hysterectomy 09/01/16 and SLNB, completed chemotherapy on 02/23/17    Patient Stated Goals  reduce leakage and pain with intercourse    Currently in Pain?  Yes    Pain Score  1     Pain Location  Vagina    Pain Orientation  Mid    Pain Descriptors / Indicators  Burning    Pain Type  Chronic pain    Pain Onset  1 to 4 weeks ago    Pain Frequency  Intermittent    Aggravating Factors   initial and pulling out penile penetration    Pain Relieving Factors  no intercourse    Multiple Pain Sites  No  Pelvic Floor Special Questions - 08/30/18 0001    Pelvic Floor Internal Exam  Patient confirms indentification and approves PT to assess pelvic floor integrity and treatment    Exam Type  Vaginal;Rectal    Strength  fair squeeze, definite lift   increased contraction posteriorly       [facility] Adult PT Treatment/Exercise - 08/30/18 0001      Manual Therapy   Manual Therapy  Soft tissue mobilization;Internal Pelvic Floor    Soft tissue mobilization  bilateral obturator internist to elongate the tissue and the coccygeus in sidely    Internal Pelvic Floor  myofascial release to the levator ani, obturator internist, and ischiocavernosus internally rectally and obturator        Trigger Point Dry Needling - 08/30/18 0001    Consent Given?  Yes    Other Dry Needling  perineal body, ischiocavernousus, bulbocavernosus, levator ani    trigger point response, elongation of tissue          PT Education - 08/30/18 0856    Education provided  Yes    Education Details  information oncoupons for premarin    Person(s) Educated  Patient    Methods  Explanation;Handout    Comprehension  Verbalized understanding       PT Short Term Goals - 05/24/17 1054      PT SHORT TERM GOAL #5   Title  ability to have penile penetration with her husband with pain decreased >/= 20% better     Time  8    Period  Weeks    Status  Achieved        PT Long Term Goals - 08/30/18 0859      PT LONG TERM GOAL #1   Title  Pt will be independent in a home exercise program for continued strengthening and stretching    Baseline  still learning    Time  8    Period  Weeks    Status  Achieved      PT LONG TERM GOAL #6   Title  urinary leakage decreased >/= 75% so she is using 1 light pad per day due to increased pelvic floor strength    Baseline  2 pads, 1 during the day ; reports it is 50% better    Time  8    Period  Weeks    Status  On-going      PT LONG TERM GOAL #7   Title  able to have penile penetration with >/= 50% decreased in pain    Baseline  was able to have full penile penetration with pain    Period  Weeks    Status  On-going            Plan - 08/30/18 0857    Clinical Impression Statement  Patient still has pain with intercourse and urinary leakage. Patient MD wants her to use premarin to help with the pain and improve the tissue atrophy. Patient continues to have redness in the vulva area. Patient has trigger points in the perineal body, ischiocavernosus, levator ani and bulbocavernosus. Patient felt the muscle release with the soft tissue work and myofascial release. Patint will benefit from skilled therapy to reduce trigger points and elongate the tissue for the pelvic floor contraction and reduce pain with penile penetration    Clinical Impairments Affecting Rehab Potential  pt with increased left knee and  right shoulder pain    PT Frequency  Monthy    PT Duration  Other (comment)    PT Treatment/Interventions  ADLs/Self Care Home Management;Electrical Stimulation;Neuromuscular re-education;Therapeutic exercise;Balance training;Manual techniques;Patient/family education;Therapeutic activities;Dry needling    PT Next Visit Plan  continues with soft tissue work to the vaginal area, work on pelvic floor contraction with sit to stand, dry needling, see if premarin is helping    PT Home Exercise Plan  progress as needed    Consulted and Agree with Plan of Care  Patient       Patient will benefit from skilled therapeutic intervention in order to improve the following deficits and impairments:  Decreased balance, Decreased endurance, Decreased mobility, Difficulty walking, Impaired sensation, Decreased strength, Pain, Postural dysfunction, Increased edema, Increased muscle spasms, Increased fascial restricitons  Visit Diagnosis: 1. Other muscle spasm   2. Unspecified lack of coordination   3. Muscle weakness (generalized)        Problem List Patient Active Problem List   Diagnosis Date Noted  . Vagina, candidiasis 08/26/2018  . 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 Vitamin D deficiency 12/25/2017  . Physical deconditioning 03/25/2017  . Obesity (BMI 30.0-34.9) 03/25/2017  . Drug-induced hyperglycemia 12/23/2016  . Port catheter in place 12/02/2016  . Dysuria 12/02/2016  . 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 Acute rhinitis 11/04/2016  . Goals of care, counseling/discussion 11/04/2016  . 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 Endometrial cancer ([place]) 08/24/2016    Earlie Counts, PT 08/30/18 9:01 AM   [facility] [address] [place], [place], [address] Phone: [phone]   Fax:  [phone]  Name: Danielle Guzman MRN: [id number] Date of Birth: 01-Nov-1965

## 2018-09-09 MED FILL — PREMARIN VAGINAL CREAM-APPL: 0.625 | 30 days supply | Qty: 30 | Fill #0

## 2018-09-19 MED FILL — FUROSEMIDE 20 MG TABS: 20 | 30 days supply | Qty: 30 | Fill #0

## 2018-09-27 ENCOUNTER — Ambulatory Visit: Payer: BC Managed Care – PPO | Attending: Hematology and Oncology | Admitting: Physical Therapy

## 2018-09-27 ENCOUNTER — Encounter: Payer: Self-pay | Admitting: Physical Therapy

## 2018-09-27 ENCOUNTER — Other Ambulatory Visit: Payer: Self-pay

## 2018-09-27 DIAGNOSIS — R279 Unspecified lack of coordination: Secondary | ICD-10-CM | POA: Diagnosis present

## 2018-09-27 DIAGNOSIS — M62838 Other muscle spasm: Secondary | ICD-10-CM | POA: Insufficient documentation

## 2018-09-27 DIAGNOSIS — M6281 Muscle weakness (generalized): Secondary | ICD-10-CM | POA: Diagnosis present

## 2018-09-27 NOTE — Therapy (Signed)
[facility] [address] [place], [place], [address] Phone: [phone]   Fax:  [phone]  Physical Therapy Treatment  Patient Details  Name: Danielle Guzman MRN: [id number] Date of Birth: 1965-08-15 Referring Provider (PT): Dr. Alvy Bimler   Encounter Date: 09/27/2018  PT End of Session - 09/27/18 0909    Visit Number  13    Date for PT Re-Evaluation  12/20/18    Authorization Type  new insurance for 2020    Authorization - Visit Number  7    Authorization - Number of Visits  30    PT Start Time  0900    PT Stop Time  0945    PT Time Calculation (min)  45 min    Activity Tolerance  Patient tolerated treatment well    Behavior During Therapy  [facility] for tasks assessed/performed       Past Medical History:  Diagnosis Date  . Cancer ([place]) 2018   endometrial   . Compression fx, lumbar spine (HCC)    Compression Fracture T12  . Headache    hx migraines yrs ago  . Malaria as child  . Miscarriage    x 2  . Pneumonia yrs ago  . Seizures ([place])    petite mal seizure in high school x 1, none since  . Sinus infection 08/2017    Past Surgical History:  Procedure Laterality Date  . DENTAL SURGERY     gum graft 20 yrs ago and again on 08/26/16  . DILATATION & CURETTAGE/HYSTEROSCOPY WITH MYOSURE N/A 08/11/2016   Procedure: DILATATION & CURETTAGE/HYSTEROSCOPY WITH MYOSURE;  Surgeon: Jerelyn Charles, MD;  Location: [facility] [facility];  Service: Gynecology;  Laterality: N/A;  polyp removal  . IR FLUORO GUIDE PORT INSERTION RIGHT  11/10/2016  . IR REMOVAL TUN ACCESS W/ PORT W/O FL MOD SED  03/11/2017  . IR US GUIDE VASC ACCESS RIGHT  11/10/2016  . LYMPH NODE BIOPSY N/A 09/01/2016   Procedure: SENTINEL LYMPH NODE BIOPSY;  Surgeon: Everitt Amber, MD;  Location: [facility];  Service: Gynecology;  Laterality: N/A;  . ROBOTIC ASSISTED TOTAL HYSTERECTOMY WITH BILATERAL SALPINGO OOPHERECTOMY Bilateral 09/01/2016   Procedure: ROBOTIC ASSISTED TOTAL  HYSTERECTOMY WITH BILATERAL SALPINGO OOPHORECTOMY;  Surgeon: Everitt Amber, MD;  Location: [facility];  Service: Gynecology;  Laterality: Bilateral;  . WISDOM TOOTH EXTRACTION      There were no vitals filed for this visit.  Subjective Assessment - 09/27/18 0901    Subjective  I only have a little leakage. I am better from last visit. The intercourse is still having pain. I have not gotten the premarin. With interourse the initial penetration is painful and more on the left and posterior. I use my dilator more than the therawand. I have some pain with the dilator with initial penetration and when taking out.    Pertinent History  ovarian cancer with complete hysterectomy 09/01/16 and SLNB, completed chemotherapy on 02/23/17    Patient Stated Goals  reduce leakage and pain with intercourse    Currently in Pain?  Yes    Pain Score  8     Pain Location  Vagina    Pain Orientation  Mid;Lower    Pain Descriptors / Indicators  Burning    Pain Type  Chronic pain    Pain Onset  1 to 4 weeks ago    Pain Frequency  Intermittent    Aggravating Factors   initial and pulling out penile penetration    Pain  Relieving Factors  no intercourse    Multiple Pain Sites  No         [facility] PT Assessment - 09/27/18 0001      Assessment   Medical Diagnosis  C79.82 secondary malignant neoplasm of fallopian tube    Referring Provider (PT)  Dr. Alvy Bimler    Onset Date/Surgical Date  09/01/17    Hand Dominance  Right    Prior Therapy  no pelvic floor therapy      Precautions   Precautions  Other (comment)    Precaution Comments  at risk for LE lymphedema      Restrictions   Weight Bearing Restrictions  No      [place] residence      Prior Function   Level of Independence  Independent      Cognition   Overall Cognitive Status  Within Functional Limits for tasks assessed      Strength   Right Hip Flexion  4+/5    Right Hip ABduction  5/5    Right Hip ADduction  5/5     Left Hip Flexion  4/5    Left Hip ABduction  4-/5    Left Hip ADduction  4/5                Pelvic Floor Special Questions - 09/27/18 0001    Currently Sexually Active  Yes    Is this Painful  Yes   initial penetration and pulling out   Urinary Leakage  Yes    Pad use  1 at night, 1 during the day    Activities that cause leaking  Other    Other activities that cause leaking  sit to stand    Pelvic Floor Internal Exam  Patient confirms indentification and approves PT to assess pelvic floor integrity and treatment    Exam Type  Vaginal    Strength  fair squeeze, definite lift        [facility] Adult PT Treatment/Exercise - 09/27/18 0001      Manual Therapy   Manual Therapy  Myofascial release    Internal Pelvic Floor  myofascial release to bil. transverse perineum, bulbocavernosus, ishiocavernosus, perineal body, posterior forshette       Trigger Point Dry Needling - 09/27/18 0001    Consent Given?  Yes    Other Dry Needling  perineal body, ischiocavernousus, bulbocavernosus, levator ani   trigger point response, elongation of tissue            PT Short Term Goals - 05/24/17 1054      PT SHORT TERM GOAL #5   Title  ability to have penile penetration with her husband with pain decreased >/= 20% better     Time  8    Period  Weeks    Status  Achieved        PT Long Term Goals - 09/27/18 4656      PT LONG TERM GOAL #6   Title  urinary leakage decreased >/= 75% so she is using 1 light pad per day due to increased pelvic floor strength    Baseline  1 pad during the day an d1 during the day, trying to do less nights without pad    Time  8    Period  Weeks    Status  On-going            Plan - 09/27/18 1002    Clinical Impression Statement  Patient is now using 1 pad during the day. At night she is using 1 pad 3 times per week. Patient reports decrease in urinary leakage. Patient has fascial tightness and muscle tightness in the perineal body,  ishiocavernosus, bulbocavernosus, and levator ani. Patient has pain with initial penile penetration and while the penis is being pulled out. Patient is using her dilator and trigger point wand to work on the pelvic floor muscles to improve tissue mobility. Patient has increased strength of her hips and needs further strengthening of the left hip. Pelvic floor strength is 3/5 with improved circular contraction. Patient will beneift from skilled therpay to improve strength and tissue mobility of the perineum.    Rehab Potential  Excellent    Clinical Impairments Affecting Rehab Potential  pt with increased left knee and right shoulder pain    PT Frequency  Monthy    PT Duration  12 weeks   4 months   PT Treatment/Interventions  ADLs/Self Care Home Management;Electrical Stimulation;Neuromuscular re-education;Therapeutic exercise;Balance training;Manual techniques;Patient/family education;Therapeutic activities;Dry needling    PT Next Visit Plan  continues with soft tissue work to the vaginal area, work on pelvic floor contraction with sit to stand, dry needling, see if premarin is helping    PT Home Exercise Plan  progress as needed    Recommended Other Services  sent MD renewal    Consulted and Agree with Plan of Care  Patient       Patient will benefit from skilled therapeutic intervention in order to improve the following deficits and impairments:  Decreased balance, Decreased endurance, Decreased mobility, Difficulty walking, Impaired sensation, Decreased strength, Pain, Postural dysfunction, Increased edema, Increased muscle spasms, Increased fascial restricitons  Visit Diagnosis: 1. Other muscle spasm   2. Unspecified lack of coordination   3. Muscle weakness (generalized)        Problem List Patient Active Problem List   Diagnosis Date Noted  . Vagina, candidiasis 08/26/2018  . 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 Vitamin D deficiency 12/25/2017  . Physical deconditioning  03/25/2017  . Obesity (BMI 30.0-34.9) 03/25/2017  . Drug-induced hyperglycemia 12/23/2016  . Port catheter in place 12/02/2016  . Dysuria 12/02/2016  . 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 Acute rhinitis 11/04/2016  . Goals of care, counseling/discussion 11/04/2016  . 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 Endometrial cancer ([place]) 08/24/2016    Earlie Counts, PT 09/27/18 12:00 PM   [facility] [address], [place], [place], [address] Phone: [phone]   Fax:  [phone]  Name: EDWENA MAYORGA MRN: [id number] Date of Birth: 28-Oct-1965

## 2018-09-28 ENCOUNTER — Encounter: Payer: BC Managed Care – PPO | Admitting: Physical Therapy

## 2018-10-20 ENCOUNTER — Other Ambulatory Visit: Payer: Self-pay

## 2018-10-20 ENCOUNTER — Ambulatory Visit: Payer: BC Managed Care – PPO | Attending: Hematology and Oncology | Admitting: Physical Therapy

## 2018-10-20 ENCOUNTER — Encounter: Payer: Self-pay | Admitting: Physical Therapy

## 2018-10-20 DIAGNOSIS — M62838 Other muscle spasm: Secondary | ICD-10-CM | POA: Insufficient documentation

## 2018-10-20 DIAGNOSIS — M6281 Muscle weakness (generalized): Secondary | ICD-10-CM | POA: Diagnosis present

## 2018-10-20 DIAGNOSIS — R279 Unspecified lack of coordination: Secondary | ICD-10-CM | POA: Insufficient documentation

## 2018-10-20 NOTE — Therapy (Signed)
[facility] [address] [place], [place], [address] Phone: [phone]   Fax:  [phone]  Physical Therapy Treatment  Patient Details  Name: Danielle Guzman MRN: [id number] Date of Birth: August 20, 1965 Referring Provider (PT): Dr. Alvy Bimler   Encounter Date: 10/20/2018  PT End of Session - 10/20/18 0926    Visit Number  56    Date for PT Re-Evaluation  12/20/18    Authorization Type  new insurance for 2020    Authorization - Visit Number  8    Authorization - Number of Visits  30    PT Start Time  0845    PT Stop Time  0925    PT Time Calculation (min)  40 min    Activity Tolerance  Patient tolerated treatment well    Behavior During Therapy  [facility] for tasks assessed/performed       Past Medical History:  Diagnosis Date  . Cancer ([place]) 2018   endometrial   . Compression fx, lumbar spine (HCC)    Compression Fracture T12  . Headache    hx migraines yrs ago  . Malaria as child  . Miscarriage    x 2  . Pneumonia yrs ago  . Seizures ([place])    petite mal seizure in high school x 1, none since  . Sinus infection 08/2017    Past Surgical History:  Procedure Laterality Date  . DENTAL SURGERY     gum graft 20 yrs ago and again on 08/26/16  . DILATATION & CURETTAGE/HYSTEROSCOPY WITH MYOSURE N/A 08/11/2016   Procedure: DILATATION & CURETTAGE/HYSTEROSCOPY WITH MYOSURE;  Surgeon: Jerelyn Charles, MD;  Location: [facility];  Service: Gynecology;  Laterality: N/A;  polyp removal  . IR FLUORO GUIDE PORT INSERTION RIGHT  11/10/2016  . IR REMOVAL TUN ACCESS W/ PORT W/O FL MOD SED  03/11/2017  . IR US GUIDE VASC ACCESS RIGHT  11/10/2016  . LYMPH NODE BIOPSY N/A 09/01/2016   Procedure: SENTINEL LYMPH NODE BIOPSY;  Surgeon: Everitt Amber, MD;  [facility];  Service: Gynecology;  Laterality: N/A;  . ROBOTIC ASSISTED TOTAL HYSTERECTOMY WITH BILATERAL SALPINGO OOPHERECTOMY Bilateral 09/01/2016   Procedure: ROBOTIC ASSISTED TOTAL  HYSTERECTOMY WITH BILATERAL SALPINGO OOPHORECTOMY;  Surgeon: Everitt Amber, MD;  [facility];  Service: Gynecology;  Laterality: Bilateral;  . WISDOM TOOTH EXTRACTION      There were no vitals filed for this visit.  Subjective Assessment - 10/20/18 0850    Subjective  Sex is better than last week and pain is not as intense and last treatment helped. Urinary leakage is better from last visit.    Pertinent History  ovarian cancer with complete hysterectomy 09/01/16 and SLNB, completed chemotherapy on 02/23/17    Patient Stated Goals  reduce leakage and pain with intercourse    Currently in Pain?  Yes    Pain Score  5     Pain Location  Vagina    Pain Orientation  Lower;Mid    Pain Descriptors / Indicators  Burning    Pain Type  Chronic pain    Pain Onset  1 to 4 weeks ago    Pain Frequency  Intermittent    Aggravating Factors   initial and pulling out penile penetration    Pain Relieving Factors  no intercourse    Multiple Pain Sites  No                    Pelvic Floor Special Questions -  10/20/18 0001    Pelvic Floor Internal Exam  Patient confirms indentification and approves PT to assess pelvic floor integrity and treatment    Exam Type  Vaginal    Strength  fair squeeze, definite lift    Strength # of seconds  10        [facility] Adult PT Treatment/Exercise - 10/20/18 0001      Manual Therapy   Manual Therapy  Myofascial release    Internal Pelvic Floor  myofascial release to bil. transverse perineum, bulbocavernosus, ishiocavernosus, perineal body, posterior forshette       Trigger Point Dry Needling - 10/20/18 0001    Consent Given?  Yes    Other Dry Needling  perineal body, ischiocavernousus, bulbocavernosus, levator ani   trigger point response, elongation of tissue            PT Short Term Goals - 05/24/17 1054      PT SHORT TERM GOAL #5   Title  ability to have penile penetration with her husband with pain decreased >/= 20% better     Time   8    Period  Weeks    Status  Achieved        PT Long Term Goals - 10/20/18 8315      PT LONG TERM GOAL #6   Title  urinary leakage decreased >/= 75% so she is using 1 light pad per day due to increased pelvic floor strength    Baseline  1- 2 pad during the day an    Time  8    Period  Weeks    Status  On-going      PT LONG TERM GOAL #7   Title  able to have penile penetration with >/= 50% decreased in pain    Baseline  was able to have full penile penetration with pain, pain level 5/10    Time  8    Period  Weeks    Status  On-going      PT LONG TERM GOAL #8   Title  Deep ache in the pelvis has decreased >/= 75% due to improved tissue mobility    Baseline  pain level more consistent with 1/10 and 80% better    Time  8    Period  Weeks    Status  Achieved            Plan - 10/20/18 1761    Clinical Impression Statement  Patient pain with penile penetration is not taking her breath away and is 5/10 instead of 8/10. Patient is able to sleep without underwear now. Patient wears 1-2 pads per day. Pads are 1/3 to 1/2 full compared to 3/4 full. Patient has improve circular contraction of the pelvic floor and able to now hold 10 seconds with strong hold. Patient will benefit from skilled therapy to improve strength and tissue mobility of the perineum.    Rehab Potential  Excellent    Clinical Impairments Affecting Rehab Potential  pt with increased left knee and right shoulder pain    PT Frequency  Monthy    PT Duration  12 weeks   4 months   PT Treatment/Interventions  ADLs/Self Care Home Management;Electrical Stimulation;Neuromuscular re-education;Therapeutic exercise;Balance training;Manual techniques;Patient/family education;Therapeutic activities;Dry needling    PT Next Visit Plan  continues with soft tissue work to the vaginal area, work on pelvic floor contraction with sit to stand, dry needling, see if premarin is helping    PT Home Exercise Plan  progress  as needed     Recommended Other Services  MD signed renewal    Consulted and Agree with Plan of Care  Patient       Patient will benefit from skilled therapeutic intervention in order to improve the following deficits and impairments:  Decreased balance, Decreased endurance, Decreased mobility, Difficulty walking, Impaired sensation, Decreased strength, Pain, Postural dysfunction, Increased edema, Increased muscle spasms, Increased fascial restricitons  Visit Diagnosis: 1. Other muscle spasm   2. Unspecified lack of coordination   3. Muscle weakness (generalized)        Problem List Patient Active Problem List   Diagnosis Date Noted  . Vagina, candidiasis 08/26/2018  . 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 Vitamin D deficiency 12/25/2017  . Physical deconditioning 03/25/2017  . Obesity (BMI 30.0-34.9) 03/25/2017  . Drug-induced hyperglycemia 12/23/2016  . Port catheter in place 12/02/2016  . Dysuria 12/02/2016  . 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 Acute rhinitis 11/04/2016  . Goals of care, counseling/discussion 11/04/2016  . 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 Endometrial cancer ([place]) 08/24/2016    Earlie Counts, PT 10/20/18 9:31 AM   [facility] [address] [place], [place], [address] Phone: [phone]   Fax:  [phone]  Name: LOLETTA HARPER MRN: [id number] Date of Birth: [birth year]

## 2018-11-10 ENCOUNTER — Other Ambulatory Visit: Payer: Self-pay

## 2018-11-10 ENCOUNTER — Ambulatory Visit: Payer: BC Managed Care – PPO | Admitting: Physical Therapy

## 2018-11-10 ENCOUNTER — Encounter: Payer: Self-pay | Admitting: Physical Therapy

## 2018-11-10 DIAGNOSIS — R279 Unspecified lack of coordination: Secondary | ICD-10-CM

## 2018-11-10 DIAGNOSIS — M62838 Other muscle spasm: Secondary | ICD-10-CM | POA: Diagnosis not present

## 2018-11-10 DIAGNOSIS — M6281 Muscle weakness (generalized): Secondary | ICD-10-CM

## 2018-11-10 NOTE — Therapy (Signed)
[facility] [address], [place], [place], [address] Phone: [phone]   Fax:  [phone]  Physical Therapy Treatment  Patient Details  Name: Danielle Guzman MRN: [id number] Date of Birth: 1965-07-08 Referring Provider (PT): Dr. Alvy Bimler   Encounter Date: 11/10/2018  PT End of Session - 11/10/18 0810    Visit Number  23    Date for PT Re-Evaluation  12/20/18    Authorization Type  new insurance for 2020    Authorization Time Period  insurance will not pay for more visits. Patient is paying out of pocket    Authorization - Visit Number  9    Authorization - Number of Visits  30    PT Start Time  0800    PT Stop Time  0840    PT Time Calculation (min)  40 min    Activity Tolerance  Patient tolerated treatment well    Behavior During Therapy  WFL for tasks assessed/performed       Past Medical History:  Diagnosis Date  . Cancer ([place]) 2018   endometrial   . Compression fx, lumbar spine (HCC)    Compression Fracture T12  . Headache    hx migraines yrs ago  . Malaria as child  . Miscarriage    x 2  . Pneumonia yrs ago  . Seizures ([place])    petite mal seizure in high school x 1, none since  . Sinus infection 08/2017    Past Surgical History:  Procedure Laterality Date  . DENTAL SURGERY     gum graft 20 yrs ago and again on 08/26/16  . DILATATION & CURETTAGE/HYSTEROSCOPY WITH MYOSURE N/A 08/11/2016   Procedure: DILATATION & CURETTAGE/HYSTEROSCOPY WITH MYOSURE;  Surgeon: Jerelyn Charles, MD;  Location: [facility];  Service: Gynecology;  Laterality: N/A;  polyp removal  . IR FLUORO GUIDE PORT INSERTION RIGHT  11/10/2016  . IR REMOVAL TUN ACCESS W/ PORT W/O FL MOD SED  03/11/2017  . IR US GUIDE VASC ACCESS RIGHT  11/10/2016  . LYMPH NODE BIOPSY N/A 09/01/2016   Procedure: SENTINEL LYMPH NODE BIOPSY;  Surgeon: Everitt Amber, MD;  Location: [facility];  Service: Gynecology;  Laterality: N/A;  . ROBOTIC ASSISTED TOTAL HYSTERECTOMY  WITH BILATERAL SALPINGO OOPHERECTOMY Bilateral 09/01/2016   Procedure: ROBOTIC ASSISTED TOTAL HYSTERECTOMY WITH BILATERAL SALPINGO OOPHORECTOMY;  Surgeon: Everitt Amber, MD;  Location: [facility];  Service: Gynecology;  Laterality: Bilateral;  . WISDOM TOOTH EXTRACTION      There were no vitals filed for this visit.  Subjective Assessment - 11/10/18 0806    Subjective  I have hurt my back and have not been consistent with my exercise. I have an appoinment with my chiropractor on Monday. I was on the toilet and leaned to the left and felt my back go.    Patient is accompained by:  Interpreter    Pertinent History  ovarian cancer with complete hysterectomy 09/01/16 and SLNB, completed chemotherapy on 02/23/17    Patient Stated Goals  reduce leakage and pain with intercourse    Currently in Pain?  Yes    Pain Score  5     Pain Location  Vagina    Pain Orientation  Lower;Mid    Pain Descriptors / Indicators  Burning    Pain Type  Chronic pain    Pain Onset  1 to 4 weeks ago    Pain Frequency  Intermittent    Aggravating Factors   initial and pulling out  penile penetration    Pain Relieving Factors  no intercourse    Multiple Pain Sites  Yes    Pain Score  4    Pain Location  Back    Pain Orientation  Lower    Pain Descriptors / Indicators  Dull;Radiating    Pain Type  Acute pain    Pain Onset  1 to 4 weeks ago    Pain Frequency  Intermittent    Aggravating Factors   bending forward    Pain Relieving Factors  be careful of movement                    Pelvic Floor Special Questions - 11/10/18 0001    Pelvic Floor Internal Exam  Patient confirms indentification and approves PT to assess pelvic floor integrity and treatment    Exam Type  Vaginal        [facility] Adult PT Treatment/Exercise - 11/10/18 0001      Neuro Re-ed    Neuro Re-ed Details   tapping of the sphincter muscles to improve contraction       Manual Therapy   Manual Therapy  Internal Pelvic Floor    Internal  Pelvic Floor  soft tissue work to bulbocavernosus, ischiocavernosus, levator ani, introitus       Trigger Point Dry Needling - 11/10/18 0001    Consent Given?  Yes    Other Dry Needling  perineal body, ischiocavernousus, bulbocavernosus, levator ani   trigger point response, elongation of tissue            PT Short Term Goals - 05/24/17 1054      PT SHORT TERM GOAL #5   Title  ability to have penile penetration with her husband with pain decreased >/= 20% better     Time  8    Period  Weeks    Status  Achieved        PT Long Term Goals - 11/10/18 0845      PT LONG TERM GOAL #7   Title  able to have penile penetration with >/= 50% decreased in pain    Time  8    Period  Weeks    Status  On-going            Plan - 11/10/18 [id number]    Clinical Impression Statement  Patient has not been able to do her exercises due to her back had a flare-up. Patient is seeing her chiropractor next week. Patient continues to have urinary leakage. Patient has not had intercourse. The vaginal area does not have the ridge anymore and the tissue is more supple. Patient will benefit from skilled therapy to improve strength and tissue mobility of the perineum.    Rehab Potential  Excellent    PT Frequency  [place]    PT Duration  Other (comment)   4 months   PT Treatment/Interventions  ADLs/Self Care Home Management;Electrical Stimulation;Neuromuscular re-education;Therapeutic exercise;Balance training;Manual techniques;Patient/family education;Therapeutic activities;Dry needling    PT Next Visit Plan  continues with soft tissue work to the vaginal area, work on pelvic floor contraction with sit to stand, dry needling    PT Home Exercise Plan  progress as needed    Consulted and Agree with Plan of Care  Patient       Patient will benefit from skilled therapeutic intervention in order to improve the following deficits and impairments:  Decreased balance, Decreased endurance, Decreased  mobility, Difficulty walking, Impaired sensation, Decreased strength, Pain, Postural  dysfunction, Increased edema, Increased muscle spasms, Increased fascial restricitons  Visit Diagnosis: Other muscle spasm  Unspecified lack of coordination  Muscle weakness (generalized)     Problem List Patient Active Problem List   Diagnosis Date Noted  . Vagina, candidiasis 08/26/2018  . 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 Vitamin D deficiency 12/25/2017  . Physical deconditioning 03/25/2017  . Obesity (BMI 30.0-34.9) 03/25/2017  . Drug-induced hyperglycemia 12/23/2016  . Port catheter in place 12/02/2016  . Dysuria 12/02/2016  . 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 Acute rhinitis 11/04/2016  . Goals of care, counseling/discussion 11/04/2016  . 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 Endometrial cancer ([place]) 08/24/2016    Earlie Counts, PT 11/10/18 8:46 AM   [facility] [address] [place], [place], [address] Phone: [phone]   Fax:  [phone]  Name: Danielle Guzman MRN: [id number] Date of Birth: Nov 13, 1965

## 2018-11-30 ENCOUNTER — Encounter: Payer: Self-pay | Admitting: Physical Therapy

## 2018-11-30 ENCOUNTER — Other Ambulatory Visit: Payer: Self-pay

## 2018-11-30 ENCOUNTER — Ambulatory Visit: Payer: BC Managed Care – PPO | Attending: Hematology and Oncology | Admitting: Physical Therapy

## 2018-11-30 DIAGNOSIS — M62838 Other muscle spasm: Secondary | ICD-10-CM | POA: Diagnosis not present

## 2018-11-30 DIAGNOSIS — R279 Unspecified lack of coordination: Secondary | ICD-10-CM | POA: Insufficient documentation

## 2018-11-30 DIAGNOSIS — M6281 Muscle weakness (generalized): Secondary | ICD-10-CM | POA: Diagnosis present

## 2018-11-30 NOTE — Therapy (Signed)
[facility] [address] [place], [place], [address] Phone: [phone]   Fax:  [phone]  Physical Therapy Treatment  Patient Details  Name: Danielle Guzman MRN: [id number] Date of Birth: 1965/05/19 Referring Provider (PT): Dr. Alvy Bimler   Encounter Date: 11/30/2018  PT End of Session - 11/30/18 1158    Visit Number  49    Date for PT Re-Evaluation  12/20/18    PT Start Time  1115    PT Stop Time  1155    PT Time Calculation (min)  40 min    Activity Tolerance  Patient tolerated treatment well    Behavior During Therapy  [facility] for tasks assessed/performed       Past Medical History:  Diagnosis Date  . Cancer ([place]) 2018   endometrial   . Compression fx, lumbar spine (HCC)    Compression Fracture T12  . Headache    hx migraines yrs ago  . Malaria as child  . Miscarriage    x 2  . Pneumonia yrs ago  . Seizures ([place])    petite mal seizure in high school x 1, none since  . Sinus infection 08/2017    Past Surgical History:  Procedure Laterality Date  . DENTAL SURGERY     gum graft 20 yrs ago and again on 08/26/16  . DILATATION & CURETTAGE/HYSTEROSCOPY WITH MYOSURE N/A 08/11/2016   Procedure: DILATATION & CURETTAGE/HYSTEROSCOPY WITH MYOSURE;  Surgeon: Jerelyn Charles, MD;  Location: [facility];  Service: Gynecology;  Laterality: N/A;  polyp removal  . IR FLUORO GUIDE PORT INSERTION RIGHT  11/10/2016  . IR REMOVAL TUN ACCESS W/ PORT W/O FL MOD SED  03/11/2017  . IR US GUIDE VASC ACCESS RIGHT  11/10/2016  . LYMPH NODE BIOPSY N/A 09/01/2016   Procedure: SENTINEL LYMPH NODE BIOPSY;  Surgeon: Everitt Amber, MD;  Location: [facility];  Service: Gynecology;  Laterality: N/A;  . ROBOTIC ASSISTED TOTAL HYSTERECTOMY WITH BILATERAL SALPINGO OOPHERECTOMY Bilateral 09/01/2016   Procedure: ROBOTIC ASSISTED TOTAL HYSTERECTOMY WITH BILATERAL SALPINGO OOPHORECTOMY;  Surgeon: Everitt Amber, MD;  Location: [facility];  Service: Gynecology;  Laterality:  Bilateral;  . WISDOM TOOTH EXTRACTION      There were no vitals filed for this visit.  Subjective Assessment - 11/30/18 1120    Subjective  My back is better. I have the leakage with going up and down. It is better than last time. I am able to use my egg. I still have pain with intercourse and it is up higher. He is able to go in all the way and it is not as intense as it was. I see Dr. Denman George Friday.    Pertinent History  ovarian cancer with complete hysterectomy 09/01/16 and SLNB, completed chemotherapy on 02/23/17    Patient Stated Goals  reduce leakage and pain with intercourse    Currently in Pain?  Yes    Pain Score  6     Pain Location  Vagina    Pain Orientation  Lower;Mid    Pain Descriptors / Indicators  Burning    Pain Type  Chronic pain    Pain Onset  1 to 4 weeks ago    Pain Frequency  Intermittent    Aggravating Factors   during the thrust and penile penetration into the vagina    Pain Relieving Factors  no intercourse    Multiple Pain Sites  No         [facility] PT Assessment - 11/30/18  0001      Assessment   Medical Diagnosis  C79.82 secondary malignant neoplasm of fallopian tube    Referring Provider (PT)  Dr. Alvy Bimler    Onset Date/Surgical Date  09/01/17    Hand Dominance  Right    Prior Therapy  no pelvic floor therapy      Precautions   Precautions  Other (comment)    Precaution Comments  at risk for LE lymphedema      Restrictions   Weight Bearing Restrictions  No      [place] residence      Prior Function   Level of Independence  Independent      Cognition   Overall Cognitive Status  Within Functional Limits for tasks assessed      Strength   Right Hip Flexion  5/5    Right Hip ABduction  5/5    Right Hip ADduction  5/5    Left Hip Flexion  4+/5    Left Hip ABduction  4/5    Left Hip ADduction  4+/5                Pelvic Floor Special Questions - 11/30/18 0001    Activities that cause leaking   Other    Other activities that cause leaking  going from sit to stand in mid range    Pelvic Floor Internal Exam  Patient confirms indentification and approves PT to assess pelvic floor integrity and treatment    Exam Type  Vaginal    Strength  good squeeze, good lift, able to hold agaisnt strong resistance   slight buttocks pain on left   Strength # of seconds  10                PT Education - 11/30/18 1157    Education provided  Yes    Education Details  using the vaginal egg with sit to stand in plie stance to work on urinary leakage    Person(s) Educated  Patient    Methods  Explanation    Comprehension  Verbalized understanding       PT Short Term Goals - 05/24/17 1054      PT SHORT TERM GOAL #5   Title  ability to have penile penetration with her husband with pain decreased >/= 20% better     Time  8    Period  Weeks    Status  Achieved        PT Long Term Goals - 11/30/18 1200      PT LONG TERM GOAL #1   Title  Pt will be independent in a home exercise program for continued strengthening and stretching    Time  8    Period  Weeks    Status  Achieved      PT LONG TERM GOAL #6   Title  urinary leakage decreased >/= 75% so she is using 1 light pad per day due to increased pelvic floor strength    Time  8    Period  Weeks    Status  Achieved      PT LONG TERM GOAL #7   Title  able to have penile penetration with >/= 50% decreased in pain    Time  8    Period  Weeks    Status  Achieved      PT LONG TERM GOAL #8   Title  Deep ache in the pelvis has decreased >/=  75% due to improved tissue mobility    Time  8    Period  Weeks    Status  Achieved            Plan - 11/30/18 1201    Clinical Impression Statement  Patient has met her goals. Patient has urinary leakage when she goes from sit to stand and back. Patient will use a vaginal egg to work on strength during that task. Patient continues to have some pain with intercourse but is ale to have  full penetration. Patient is using a wand to perform soft tissue work to the pelvic floor muscles. Patient has more tightness on the left pelvic floor compared to the right. When patient contracts her pelvic floor she will have some left gluteal pain. Patient is diligent with her HEP and is ready for discharge to work with the remaining deficits.    Clinical Impairments Affecting Rehab Potential  pt with increased left knee and right shoulder pain    PT Treatment/Interventions  ADLs/Self Care Home Management;Electrical Stimulation;Neuromuscular re-education;Therapeutic exercise;Balance training;Manual techniques;Patient/family education;Therapeutic activities;Dry needling    PT Next Visit Plan  discharge to HEP    Consulted and Agree with Plan of Care  Patient       Patient will benefit from skilled therapeutic intervention in order to improve the following deficits and impairments:  Decreased balance, Decreased endurance, Decreased mobility, Difficulty walking, Impaired sensation, Decreased strength, Pain, Postural dysfunction, Increased edema, Increased muscle spasms, Increased fascial restricitons  Visit Diagnosis: Other muscle spasm  Unspecified lack of coordination  Muscle weakness (generalized)     Problem List Patient Active Problem List   Diagnosis Date Noted  . Vagina, candidiasis 08/26/2018  . 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 Vitamin D deficiency 12/25/2017  . Physical deconditioning 03/25/2017  . Obesity (BMI 30.0-34.9) 03/25/2017  . Drug-induced hyperglycemia 12/23/2016  . Port catheter in place 12/02/2016  . Dysuria 12/02/2016  . 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 Acute rhinitis 11/04/2016  . Goals of care, counseling/discussion 11/04/2016  . 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 Endometrial cancer ([place]) 08/24/2016    Danielle Guzman 11/30/2018, 12:04 PM  [facility] [address], [place], [place], [address] Phone: [phone]   Fax:  [phone]  Name: Danielle Guzman MRN: [id number] Date of Birth: 1965/08/03  PHYSICAL THERAPY DISCHARGE SUMMARY  Visits from Start of Care: 51  Current functional level related to goals / functional outcomes: See above.    Remaining deficits: See above.    Education / Equipment: HEP Plan: Patient agrees to discharge.  Patient goals were met. Patient is being discharged due to meeting the stated rehab goals. Thank you for the referral. Earlie Counts, PT 11/30/18 12:05 PM   ?????

## 2018-12-02 ENCOUNTER — Encounter: Payer: Self-pay | Admitting: Gynecologic Oncology

## 2018-12-02 ENCOUNTER — Inpatient Hospital Stay: Payer: BC Managed Care – PPO | Attending: Gynecologic Oncology | Admitting: Gynecologic Oncology

## 2018-12-02 ENCOUNTER — Other Ambulatory Visit: Payer: Self-pay

## 2018-12-02 VITALS — BP 122/69 | HR 64 | Temp 98.2°F | Resp 18 | Ht 66.0 in | Wt 211.9 lb

## 2018-12-02 DIAGNOSIS — N952 Postmenopausal atrophic vaginitis: Secondary | ICD-10-CM | POA: Diagnosis not present

## 2018-12-02 DIAGNOSIS — Z800 Family history of malignant neoplasm of digestive organs: Secondary | ICD-10-CM | POA: Diagnosis not present

## 2018-12-02 DIAGNOSIS — N941 Unspecified dyspareunia: Secondary | ICD-10-CM | POA: Insufficient documentation

## 2018-12-02 DIAGNOSIS — Z90722 Acquired absence of ovaries, bilateral: Secondary | ICD-10-CM

## 2018-12-02 DIAGNOSIS — Z90710 Acquired absence of both cervix and uterus: Secondary | ICD-10-CM | POA: Insufficient documentation

## 2018-12-02 DIAGNOSIS — B373 Candidiasis of vulva and vagina: Secondary | ICD-10-CM | POA: Diagnosis not present

## 2018-12-02 DIAGNOSIS — C541 Malignant neoplasm of endometrium: Secondary | ICD-10-CM | POA: Diagnosis present

## 2018-12-02 DIAGNOSIS — R600 Localized edema: Secondary | ICD-10-CM | POA: Diagnosis not present

## 2018-12-02 DIAGNOSIS — Z9221 Personal history of antineoplastic chemotherapy: Secondary | ICD-10-CM | POA: Insufficient documentation

## 2018-12-02 DIAGNOSIS — Z803 Family history of malignant neoplasm of breast: Secondary | ICD-10-CM | POA: Insufficient documentation

## 2018-12-02 DIAGNOSIS — B3731 Acute candidiasis of vulva and vagina: Secondary | ICD-10-CM

## 2018-12-02 DIAGNOSIS — Z79899 Other long term (current) drug therapy: Secondary | ICD-10-CM | POA: Insufficient documentation

## 2018-12-02 DIAGNOSIS — Z87891 Personal history of nicotine dependence: Secondary | ICD-10-CM | POA: Insufficient documentation

## 2018-12-02 DIAGNOSIS — Z8049 Family history of malignant neoplasm of other genital organs: Secondary | ICD-10-CM | POA: Diagnosis not present

## 2018-12-02 MED ORDER — FUROSEMIDE 20 MG PO TABS: 20.0000 mg | ORAL_TABLET | Freq: Every day | ORAL | 3 refills | Status: DC | PRN

## 2018-12-02 MED ORDER — FLUCONAZOLE 100 MG PO TABS: 150.0000 mg | ORAL_TABLET | Freq: Once | ORAL | 1 refills | Status: AC

## 2018-12-02 MED FILL — FUROSEMIDE 20 MG TABS: 20 | 30 days supply | Qty: 30 | Fill #0

## 2018-12-02 MED FILL — FLUCONAZOLE 100 MG TAB: 100 | 1 days supply | Qty: 2 | Fill #1

## 2018-12-02 MED FILL — PREMARIN VAGINAL CREAM-APPL: 0.625 | 30 days supply | Qty: 30 | Fill #0

## 2018-12-02 NOTE — Progress Notes (Signed)
Follow-up Note: Gyn-Onc  Consult was requested by Dr. Loletta Specter and Dr Stann Mainland for the evaluation of Danielle Guzman 53 y.o. female  CC:  Chief Complaint  Patient presents with  . endometrial cancer    Assessment/Plan:  Danielle Guzman  is a 53 y.o.  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 MSI unstable.   She is s/p staging surgery on 09/01/16. And s/p adjuvat carboplatin and paclitaxel x 6 cycles between 11/12/2016 - 02/23/17.   No evidence of recurrence on exam. Continue 3 monthly surveillance visits until December, 2020.  Declined genetics consultation despite loss of MSH6 on IHC and strong family history of cancer  .   T12 compression fraction - improved symptoms with chiropractor. If progresses will order repeat CT scan.   Pelvic floor pain with levator spasm -  continue therapy with PT.   Vulvovaginal candidiasis - Rx for diflucan reordered.  Vulvovaginal atrophy - Rx provided for vaginal premarin 3 times per week.  Edema of lower extremities - lasix prescribed. Right breast nipple skin changes - dermatology consult scheduled. Recommended mammography however Jenevie prefers thermography to mammography.  Recommended colonoscopy screening. Counseled regarding increased risk for colon cancer given her MSI high endometrial cancer and my concern for occult Lynch syndrome. She declined this.   HPI: Danielle Guzman is a 53 year old [id number] who is seen in consultation at the request of Dr Stann Mainland and Dr Jerelyn Charles for endometrial cancer.  The patient reports early menopause at age 60 (all of her female relatives have had early menopause). She began experiencing postmenopausal bleeding in March 2018. She was seen by Dr Stann Mainland on 06/02/16 and a TVUS showed an endometrium of 1.8cm with a 3cm hypervascular polyp.  She was taken to the OR on 08/11/16 for a hysteroscopy/D&C with Dr Jerelyn Charles and the specimen showed endometrial adenocarcinoma with  the majority of the specimen revealing grade 2 endometrioid endometrial cancer with a small focus (5%) of clear cell carcinoma and a few foci suspicious for serous carcinoma.  She is otherwise very healthy. She has had no prior surgeries or vaginal deliveries.  Her mother has a history of breast cancer x 3, she has a sister with a history of cervical cancer. Her maternal aunt has a history of breast cancer. A different maternal aunt had uterine and colon cancer. Her maternal cousin had uterine cancer. Her paternal grandmother had a diagnosis of colon cancer and her paternal cousins had hysterectomies for "abnromal cells" but no cancer.  On 09/01/16 she underwent robotic assisted total hysterectomy, BSO, SLN biopsy. Surgery was uncomplicated. The final pathology revealed a 1.8cm grade 2 endometrioid endometrial cancer ( with rare foci of squamous differentiation and a rare microscopic focus of serous differentiation). There was luminal foci of carcinoma within the right and left fallopian tubes and the right fallopian tube lumen involvement showed a focus of similar appearing chondroid stroma with adjacent focus of serous carcinoma.  The lymph nodes and cervix were negative for carcinoma. The uterine tumor showed no myometrial invasion and no LVSI. Final pathology revealed loss of MSH6 on IHC.   Declined genetics consultation despite counseling that she may have Lynch syndrome which increases her risk for other cancers (that might be detected early or prevented with more frequent screenings that can be facilitated with this diagnosis).  Postoperatively she completed 6 cycles of carboplatin and paclitaxel between 11/12/17-12/11/9 after experiencing some trepidation regarding toxicity overwhelming benefits.  She has some persistent foot neuropathy post-chemotherapy and has some bilateral lower extremity edema. She has seen PT for neuropathy and pelvic pains.  Her friend recently died from advanced  uterine cancer.  She has back and abdominal pain which prompted a CT abd/pelvis on February 28, 2018.  This showed mild superior endplate compression fracture deformity of T12 which was new from 2018, no retropulsion.  There is no evidence of recurrent or metastatic disease.  She had been seeing a chiropractor for this and feels that this is improved her symptoms substantially.  Interval Hx: Symptoms persist of dyspareunia, vaginal atrophy - will attempt to fill premarin Rx. She has mild vaginal itch - feels like candida.   We discussed survivorship issues including long term toxicities of chemotherapy and menopause, we discussed sexual dysfunction, we discussed emotional well-being, we discussed non-gyn cancer screening.   Current Meds:  Outpatient Encounter Medications as of 12/02/2018  Medication Sig  . APPLE CIDER VINEGAR PO Take 1 Dose by mouth daily. Patient states she takes Gaffer 1tsp. Daily.  . Ascorbic Acid (VITAMIN C PO) Take 1 tablet by mouth 3 (three) times daily as needed (for immune health support or cold symptoms.).  Marland Kitchen b complex vitamins tablet Take 1 tablet by mouth daily.  . calcium citrate (CALCITRATE - DOSED IN MG ELEMENTAL CALCIUM) 950 MG tablet Take 200 mg of elemental calcium by mouth daily. Patient states she takes 6 pills a day.  . conjugated estrogens (PREMARIN) vaginal cream Place 1 Applicatorful vaginally 3 (three) times a week. At night  . furosemide (LASIX) 20 MG tablet Take 1 tablet (20 mg total) by mouth daily as needed.  Marland Kitchen ibuprofen (ADVIL) 200 MG tablet Take 200-400 mg by mouth every 8 (eight) hours as needed.  . Omega-3 Fatty Acids (FISH OIL) 1000 MG CAPS Take 1 capsule by mouth daily.   Marland Kitchen OVER THE COUNTER MEDICATION Take 1 tablet by mouth daily. Tumeric once a day.  . Probiotic Product (PROBIOTIC DAILY PO) Take 1 capsule by mouth daily.  . Vitamin D, Ergocalciferol, (DRISDOL) 50000 units CAPS capsule Take 1 capsule (50,000 Units total) by mouth  every 7 (seven) days.  . [DISCONTINUED] furosemide (LASIX) 20 MG tablet TAKE 1 TABLET BY MOUTH ONCE DAILY AS NEEDED  . fluconazole (DIFLUCAN) 100 MG tablet Take 1.5 tablets (150 mg total) by mouth once for 1 dose.  . [DISCONTINUED] fluconazole (DIFLUCAN) 100 MG tablet    No facility-administered encounter medications on file as of 12/02/2018.     Allergy:  Allergies  Allergen Reactions  . Latex     Long exposure causes a rash  . Tamiflu  [Oseltamivir Phosphate] Nausea Only    Social Hx:   Social History   Socioeconomic History  . Marital status: Married    Spouse name: [name]  . Number of children: 0  . Years of education: Not on file  . Highest education level: Not on file  Occupational History  . Occupation: massage therapist  Social Needs  . Financial resource strain: Not on file  . Food insecurity    Worry: Not on file    Inability: Not on file  . Transportation needs    Medical: Not on file    Non-medical: Not on file  Tobacco Use  . Smoking status: Former Smoker    Packs/day: 1.50    Years: 8.00    Pack years: 12.00    Types: Cigarettes  . Smokeless tobacco: Never Used  . Tobacco  comment: quit smoking at age 23  Substance and Sexual Activity  . Alcohol use: Yes    Alcohol/week: 4.0 standard drinks    Types: 4 Glasses of wine per week    Comment: wine occ  . Drug use: No  . Sexual activity: Yes    Birth control/protection: Post-menopausal  Lifestyle  . Physical activity    Days per week: Not on file    Minutes per session: Not on file  . Stress: Not on file  Relationships  . Social Herbalist on phone: Not on file    Gets together: Not on file    Attends religious service: Not on file    Active member of club or organization: Not on file    Attends meetings of clubs or organizations: Not on file    Relationship status: Not on file  . Intimate partner violence    Fear of current or ex partner: Not on file    Emotionally abused:  Not on file    Physically abused: Not on file    Forced sexual activity: Not on file  Other Topics Concern  . Not on file  Social History Narrative  . Not on file    Past Surgical Hx:  Past Surgical History:  Procedure Laterality Date  . DENTAL SURGERY     gum graft 20 yrs ago and again on 08/26/16  . DILATATION & CURETTAGE/HYSTEROSCOPY WITH MYOSURE N/A 08/11/2016   Procedure: DILATATION & CURETTAGE/HYSTEROSCOPY WITH MYOSURE;  Surgeon: Jerelyn Charles, MD;  Location: [facility];  Service: Gynecology;  Laterality: N/A;  polyp removal  . IR FLUORO GUIDE PORT INSERTION RIGHT  11/10/2016  . IR REMOVAL TUN ACCESS W/ PORT W/O FL MOD SED  03/11/2017  . IR US GUIDE VASC ACCESS RIGHT  11/10/2016  . LYMPH NODE BIOPSY N/A 09/01/2016   Procedure: SENTINEL LYMPH NODE BIOPSY;  Surgeon: Everitt Amber, MD;  Location: [facility];  Service: Gynecology;  Laterality: N/A;  . ROBOTIC ASSISTED TOTAL HYSTERECTOMY WITH BILATERAL SALPINGO OOPHERECTOMY Bilateral 09/01/2016   Procedure: ROBOTIC ASSISTED TOTAL HYSTERECTOMY WITH BILATERAL SALPINGO OOPHORECTOMY;  Surgeon: Everitt Amber, MD;  Location: [facility];  Service: Gynecology;  Laterality: Bilateral;  . WISDOM TOOTH EXTRACTION      Past Medical Hx:  Past Medical History:  Diagnosis Date  . Cancer ([place]) 2018   endometrial   . Compression fx, lumbar spine ([facility]CC)    Compression Fracture T12  . Headache    hx migraines yrs ago  . Malaria as child  . Miscarriage    x 2  . Pneumonia yrs ago  . Seizures ([place])    petite mal seizure in high school x 1, none since  . Sinus infection 08/2017    Past Gynecological History:  Premature menopause No LMP recorded. Patient has had a hysterectomy.  Family Hx:  Family History  Problem Relation Age of Onset  . Cancer Mother   . Cancer Sister        Cervix  . Cancer Maternal Aunt        Breast, uterine  . Cancer Paternal Grandmother        Colon    Review of Systems:  Constitutional  Feels well,    ENT Normal appearing  ears and nares bilaterally Skin/Breast  No rash, sores, jaundice, itching, dryness Cardiovascular  No chest pain, shortness of breath, or edema  Pulmonary  No cough or wheeze.  Gastro Intestinal  No nausea, vomitting, or diarrhoea. No bright  red blood per rectum, no abdominal pain, change in bowel movement, or constipation.  Genito Urinary  No frequency, urgency, dysuria, no bleeding Musculo Skeletal  + bilateral LE edema Neurologic  + foot neuropathy bilaterally  Psychology  No depression, anxiety, insomnia.   Vitals:  Blood pressure 122/69, pulse 64, temperature 98.2 F (36.8 C), temperature source Temporal, resp. rate 18, height '5\' 6"'  (1.676 m), weight 211 lb 14.4 oz (96.1 kg), SpO2 100 %.  Physical Exam: WD in NAD Neck  Supple NROM, without any enlargements.  Lymph Node Survey No cervical supraclavicular or inguinal adenopathy Cardiovascular  Pulse normal rate, regularity and rhythm. S1 and S2 normal.  Lungs  Clear to auscultation bilateraly, without wheezes/crackles/rhonchi. Good air movement.  Skin  No rash/lesions/breakdown  Psychiatry  Alert and oriented to person, place, and time  Abdomen  Normoactive bowel sounds, abdomen soft, non-tender and obese without evidence of hernia. Soft incisions.  Back No CVA tenderness Genito Urinary  : vaginal cuff normal with no lesions. No masses. + levator spasm on right with associated tenderness. There was some erythema of the introitus and cuff consistent with vulvovaginal candidasis. Thin atrophic vaginal tissues.  Rectal  deferred Extremities  No bilateral cyanosis, clubbing , mild edema lower extremities.  Thereasa Solo, MD  12/02/2018, 3:28 PM

## 2018-12-02 NOTE — Patient Instructions (Signed)
Please notify Dr Denman George at phone number [phone] if you notice vaginal bleeding, new pelvic or abdominal pains, bloating, feeling full easy, or a change in bladder or bowel function.   Dr Denman George recommends colonoscopy screening as you are at a higher risk for developing colon cancer and the recommended screenings start at age 54. Her office can facilitate a referral if you are interested.  Dr Denman George has refilled a diflucan and lasix prescription.  Take both as needed.  Please return to see Dr Denman George in December as scheduled.

## 2018-12-22 ENCOUNTER — Encounter: Payer: BC Managed Care – PPO | Admitting: Physical Therapy

## 2019-02-24 ENCOUNTER — Other Ambulatory Visit: Payer: Self-pay

## 2019-02-24 ENCOUNTER — Encounter: Payer: Self-pay | Admitting: Gynecologic Oncology

## 2019-02-24 ENCOUNTER — Inpatient Hospital Stay: Payer: BC Managed Care – PPO | Attending: Gynecologic Oncology | Admitting: Gynecologic Oncology

## 2019-02-24 VITALS — BP 135/88 | HR 78 | Temp 98.9°F | Resp 17 | Ht 66.0 in | Wt 212.8 lb

## 2019-02-24 DIAGNOSIS — Z79899 Other long term (current) drug therapy: Secondary | ICD-10-CM | POA: Insufficient documentation

## 2019-02-24 DIAGNOSIS — B373 Candidiasis of vulva and vagina: Secondary | ICD-10-CM | POA: Insufficient documentation

## 2019-02-24 DIAGNOSIS — Z8049 Family history of malignant neoplasm of other genital organs: Secondary | ICD-10-CM | POA: Diagnosis not present

## 2019-02-24 DIAGNOSIS — Z800 Family history of malignant neoplasm of digestive organs: Secondary | ICD-10-CM | POA: Diagnosis not present

## 2019-02-24 DIAGNOSIS — Z9221 Personal history of antineoplastic chemotherapy: Secondary | ICD-10-CM | POA: Diagnosis not present

## 2019-02-24 DIAGNOSIS — Z87891 Personal history of nicotine dependence: Secondary | ICD-10-CM | POA: Diagnosis not present

## 2019-02-24 DIAGNOSIS — R600 Localized edema: Secondary | ICD-10-CM | POA: Diagnosis not present

## 2019-02-24 DIAGNOSIS — Z90722 Acquired absence of ovaries, bilateral: Secondary | ICD-10-CM | POA: Diagnosis not present

## 2019-02-24 DIAGNOSIS — C541 Malignant neoplasm of endometrium: Secondary | ICD-10-CM | POA: Diagnosis present

## 2019-02-24 DIAGNOSIS — Z90710 Acquired absence of both cervix and uterus: Secondary | ICD-10-CM | POA: Diagnosis not present

## 2019-02-24 DIAGNOSIS — R1012 Left upper quadrant pain: Secondary | ICD-10-CM | POA: Diagnosis not present

## 2019-02-24 DIAGNOSIS — Z803 Family history of malignant neoplasm of breast: Secondary | ICD-10-CM | POA: Insufficient documentation

## 2019-02-24 DIAGNOSIS — N952 Postmenopausal atrophic vaginitis: Secondary | ICD-10-CM | POA: Diagnosis not present

## 2019-02-24 MED ORDER — FLUCONAZOLE 100 MG PO TABS: 150.0000 mg | ORAL_TABLET | Freq: Once | ORAL | 2 refills | Status: AC

## 2019-02-24 MED ORDER — FUROSEMIDE 20 MG PO TABS: 20.0000 mg | ORAL_TABLET | Freq: Every day | ORAL | 3 refills | Status: DC | PRN

## 2019-02-24 MED FILL — FUROSEMIDE 20 MG TABS: 20 | 30 days supply | Qty: 30 | Fill #0

## 2019-02-24 MED FILL — FLUCONAZOLE 150 MG TABS: 150 | 1 days supply | Qty: 1 | Fill #0

## 2019-02-24 NOTE — Patient Instructions (Signed)
Dr. Denman George has ordered a CT scan to better evaluate the pain in your left abdomen and side.  Her office will call you with the results as soon as they are available even if these are normal.  Presuming these are normal results she will see you again in June 2021.  She has refilled your prescriptions for Lasix and Diflucan at the [facility].  Please notify Dr Denman George at phone number [phone] if you notice vaginal bleeding, new pelvic or abdominal pains, bloating, feeling full easy, or a change in bladder or bowel function.

## 2019-02-24 NOTE — Progress Notes (Signed)
Follow-up Note: Gyn-Onc  Consult was requested by Dr. Loletta Specter and Dr Stann Mainland for the evaluation of Danielle Guzman 53 y.o. female  CC:  Chief Complaint  Patient presents with  . endometrial cancer    follow-up    Assessment/Plan:  Ms. Danielle Guzman  is a 53 y.o.  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 MSI unstable.   She is s/p staging surgery on 09/01/16. And s/p adjuvat carboplatin and paclitaxel x 6 cycles between 11/12/2016 - 02/23/17.   No evidence of recurrence on exam however patient has new concerning left abdominal/side pain. Concerning for possible retroperitoneal or intraperitoneal recurrence. I have ordered a CT scan to evaluate.  Declined genetics consultation despite loss of MSH6 on IHC and strong family history of cancer  .   Pelvic floor pain with levator spasm -  continue therapy with PT.  Vulvovaginal candidiasis - Rx for diflucan reordered.  Vulvovaginal atrophy - Rx provided for vaginal premarin 3 times per week.  Edema of lower extremities - lasix reordered Recommended colonoscopy screening. Counseled regarding increased risk for colon cancer given her MSI high endometrial cancer and my concern for occult Lynch syndrome. She declined this.   HPI: Danielle Guzman is a 53 year old [id number] who is seen in consultation at the request of Dr Stann Mainland and Dr Jerelyn Charles for endometrial cancer.  The patient reports early menopause at age 81 (all of her female relatives have had early menopause). She began experiencing postmenopausal bleeding in March 2018. She was seen by Dr Stann Mainland on 06/02/16 and a TVUS showed an endometrium of 1.8cm with a 3cm hypervascular polyp.  She was taken to the OR on 08/11/16 for a hysteroscopy/D&C with Dr Jerelyn Charles and the specimen showed endometrial adenocarcinoma with the majority of the specimen revealing grade 2 endometrioid endometrial cancer with a small focus (5%) of clear cell carcinoma and a  few foci suspicious for serous carcinoma.  She is otherwise very healthy. She has had no prior surgeries or vaginal deliveries.  Her mother has a history of breast cancer x 3, she has a sister with a history of cervical cancer. Her maternal aunt has a history of breast cancer. A different maternal aunt had uterine and colon cancer. Her maternal cousin had uterine cancer. Her paternal grandmother had a diagnosis of colon cancer and her paternal cousins had hysterectomies for "abnromal cells" but no cancer.  On 09/01/16 she underwent robotic assisted total hysterectomy, BSO, SLN biopsy. Surgery was uncomplicated. The final pathology revealed a 1.8cm grade 2 endometrioid endometrial cancer ( with rare foci of squamous differentiation and a rare microscopic focus of serous differentiation). There was luminal foci of carcinoma within the right and left fallopian tubes and the right fallopian tube lumen involvement showed a focus of similar appearing chondroid stroma with adjacent focus of serous carcinoma.  The lymph nodes and cervix were negative for carcinoma. The uterine tumor showed no myometrial invasion and no LVSI. Final pathology revealed loss of MSH6 on IHC.   Declined genetics consultation despite counseling that she may have Lynch syndrome which increases her risk for other cancers (that might be detected early or prevented with more frequent screenings that can be facilitated with this diagnosis).  Postoperatively she completed 6 cycles of carboplatin and paclitaxel between 11/12/17-12/11/9 after experiencing some trepidation regarding toxicity overwhelming benefits.   She has some persistent foot neuropathy post-chemotherapy and has some bilateral lower extremity edema. She  has seen PT for neuropathy and pelvic pains.  Her friend recently died from advanced uterine cancer.  She has back and abdominal pain which prompted a CT abd/pelvis on February 28, 2018.  This showed mild superior  endplate compression fracture deformity of T12 which was new from 2018, no retropulsion.  There is no evidence of recurrent or metastatic disease.  She had been seeing a chiropractor for this and feels that this is improved her symptoms substantially.  Interval Hx: The patient's back pain from her compression fracture has completely resolved with chiropractic work. She has developed new onset of left mid to lower abdominal and side pain.  Is not flank pain.  It is not colicky in nature.  Is not associated with bladder or bowel symptoms.  When she lays on her side either side she notices it worse.  Current Meds:  Outpatient Encounter Medications as of 02/24/2019  Medication Sig  . APPLE CIDER VINEGAR PO Take 1 Dose by mouth daily. Patient states she takes Gaffer 1tsp. Daily.  . Ascorbic Acid (VITAMIN C PO) Take 1 tablet by mouth 3 (three) times daily as needed (for immune health support or cold symptoms.).  Marland Kitchen b complex vitamins tablet Take 1 tablet by mouth daily.  . calcium citrate (CALCITRATE - DOSED IN MG ELEMENTAL CALCIUM) 950 MG tablet Take 200 mg of elemental calcium by mouth daily. Patient states she takes 6 pills a day.  . conjugated estrogens (PREMARIN) vaginal cream Place 1 Applicatorful vaginally 3 (three) times a week. At night  . fluconazole (DIFLUCAN) 100 MG tablet Take 1.5 tablets (150 mg total) by mouth once for 1 dose.  . furosemide (LASIX) 20 MG tablet Take 1 tablet (20 mg total) by mouth daily as needed.  Marland Kitchen ibuprofen (ADVIL) 200 MG tablet Take 200-400 mg by mouth every 8 (eight) hours as needed.  . Omega-3 Fatty Acids (FISH OIL) 1000 MG CAPS Take 1 capsule by mouth daily.   Marland Kitchen OVER THE COUNTER MEDICATION Take 1 tablet by mouth daily. Tumeric once a day.  . Probiotic Product (PROBIOTIC DAILY PO) Take 1 capsule by mouth daily.  . Vitamin D, Ergocalciferol, (DRISDOL) 50000 units CAPS capsule Take 1 capsule (50,000 Units total) by mouth every 7 (seven) days.  .  [DISCONTINUED] furosemide (LASIX) 20 MG tablet Take 1 tablet (20 mg total) by mouth daily as needed.   No facility-administered encounter medications on file as of 02/24/2019.    Allergy:  Allergies  Allergen Reactions  . Latex     Long exposure causes a rash  . Tamiflu  [Oseltamivir Phosphate] Nausea Only    Social Hx:   Social History   Socioeconomic History  . Marital status: Married    Spouse name: [name]  . Number of children: 0  . Years of education: Not on file  . Highest education level: Not on file  Occupational History  . Occupation: massage therapist  Tobacco Use  . Smoking status: Former Smoker    Packs/day: 1.50    Years: 8.00    Pack years: 12.00    Types: Cigarettes  . Smokeless tobacco: Never Used  . Tobacco comment: quit smoking at age 60  Substance and Sexual Activity  . Alcohol use: Yes    Alcohol/week: 4.0 standard drinks    Types: 4 Glasses of wine per week    Comment: wine occ  . Drug use: No  . Sexual activity: Yes    Birth control/protection: Post-menopausal  Other Topics  Concern  . Not on file  Social History Narrative  . Not on file   Social Determinants of Health   Financial Resource Strain:   . Difficulty of Paying Living Expenses: Not on file  Food Insecurity:   . Worried About Charity fundraiser in the Last Year: Not on file  . Ran Out of Food in the Last Year: Not on file  Transportation Needs:   . Lack of Transportation (Medical): Not on file  . Lack of Transportation (Non-Medical): Not on file  Physical Activity:   . Days of Exercise per Week: Not on file  . Minutes of Exercise per Session: Not on file  Stress:   . Feeling of Stress : Not on file  Social Connections:   . Frequency of Communication with Friends and Family: Not on file  . Frequency of Social Gatherings with Friends and Family: Not on file  . Attends Religious Services: Not on file  . Active Member of Clubs or Organizations: Not on file  .  Attends Archivist Meetings: Not on file  . Marital Status: Not on file  Intimate Partner Violence:   . Fear of Current or Ex-Partner: Not on file  . Emotionally Abused: Not on file  . Physically Abused: Not on file  . Sexually Abused: Not on file    Past Surgical Hx:  Past Surgical History:  Procedure Laterality Date  . DENTAL SURGERY     gum graft 20 yrs ago and again on 08/26/16  . DILATATION & CURETTAGE/HYSTEROSCOPY WITH MYOSURE N/A 08/11/2016   Procedure: DILATATION & CURETTAGE/HYSTEROSCOPY WITH MYOSURE;  Surgeon: Jerelyn Charles, MD;  Location: [facility];  Service: Gynecology;  Laterality: N/A;  polyp removal  . IR FLUORO GUIDE PORT INSERTION RIGHT  11/10/2016  . IR REMOVAL TUN ACCESS W/ PORT W/O FL MOD SED  03/11/2017  . IR US GUIDE VASC ACCESS RIGHT  11/10/2016  . LYMPH NODE BIOPSY N/A 09/01/2016   Procedure: SENTINEL LYMPH NODE BIOPSY;  Surgeon: Everitt Amber, MD;  Location: [facility];  Service: Gynecology;  Laterality: N/A;  . ROBOTIC ASSISTED TOTAL HYSTERECTOMY WITH BILATERAL SALPINGO OOPHERECTOMY Bilateral 09/01/2016   Procedure: ROBOTIC ASSISTED TOTAL HYSTERECTOMY WITH BILATERAL SALPINGO OOPHORECTOMY;  Surgeon: Everitt Amber, MD;  Location: [facility];  Service: Gynecology;  Laterality: Bilateral;  . WISDOM TOOTH EXTRACTION      Past Medical Hx:  Past Medical History:  Diagnosis Date  . Cancer ([place]) 2018   endometrial   . Compression fx, lumbar spine ([facility]CC)    Compression Fracture T12  . Headache    hx migraines yrs ago  . Malaria as child  . Miscarriage    x 2  . Pneumonia yrs ago  . Seizures ([place])    petite mal seizure in high school x 1, none since  . Sinus infection 08/2017    Past Gynecological History:  Premature menopause No LMP recorded. Patient has had a hysterectomy.  Family Hx:  Family History  Problem Relation Age of Onset  . Cancer Mother   . Cancer Sister        Cervix  . Cancer Maternal Aunt        Breast, uterine  . Cancer Paternal Grandmother         Colon    Review of Systems:  Constitutional  Feels well,    ENT Normal appearing ears and nares bilaterally Skin/Breast  No rash, sores, jaundice, itching, dryness Cardiovascular   No chest pain, shortness of breath,  or edema  Pulmonary  No cough or wheeze.  Gastro Intestinal  No nausea, vomitting, or diarrhoea. No bright red blood per rectum, + left abdominal pain, change in bowel movement, or constipation.  Genito Urinary  No frequency, urgency, dysuria, no bleeding Musculo Skeletal  + bilateral LE edema Neurologic  + foot neuropathy bilaterally  Psychology  No depression, anxiety, insomnia.   Vitals:  Blood pressure 135/88, pulse 78, temperature 98.9 F (37.2 C), temperature source Temporal, resp. rate 17, height '5\' 6"'  (1.676 m), weight 212 lb 12.8 oz (96.5 kg), SpO2 100 %.  Physical Exam: WD in NAD Neck  Supple NROM, without any enlargements.  Lymph Node Survey No cervical supraclavicular or inguinal adenopathy Cardiovascular  Pulse normal rate, regularity and rhythm. S1 and S2 normal.  Lungs  Clear to auscultation bilateraly, without wheezes/crackles/rhonchi. Good air movement.  Skin  No rash/lesions/breakdown  Psychiatry  Alert and oriented to person, place, and time  Abdomen  Normoactive bowel sounds, abdomen soft, and obese without evidence of hernia. Soft incisions. Tenderness deep in the left side between ASIS and rib cage. No associated mass palpable.  Back No CVA tenderness Genito Urinary  :vaginal cuff normal with no lesions. No masses. No levator spasm. There was some erythema of the introitus and cuff consistent with vulvovaginal candidasis. Thin atrophic vaginal tissues.  Rectal  deferred Extremities  No bilateral cyanosis, clubbing , mild edema lower extremities.  Thereasa Solo, MD  02/24/2019, 4:38 PM

## 2019-03-03 ENCOUNTER — Ambulatory Visit (HOSPITAL_COMMUNITY)
Admission: RE | Admit: 2019-03-03 | Discharge: 2019-03-03 | Disposition: A | Discharge status: Home / Self Care | Payer: BC Managed Care – PPO | Source: Ambulatory Visit | Attending: Gynecologic Oncology | Admitting: Gynecologic Oncology

## 2019-03-03 ENCOUNTER — Encounter (HOSPITAL_COMMUNITY): Payer: Self-pay

## 2019-03-03 ENCOUNTER — Other Ambulatory Visit: Payer: Self-pay

## 2019-03-03 DIAGNOSIS — R1012 Left upper quadrant pain: Secondary | ICD-10-CM | POA: Insufficient documentation

## 2019-03-03 DIAGNOSIS — C541 Malignant neoplasm of endometrium: Secondary | ICD-10-CM | POA: Diagnosis not present

## 2019-03-03 MED ORDER — SODIUM CHLORIDE (PF) 0.9 % IJ SOLN
INTRAMUSCULAR | Status: AC
  Filled 2019-03-03: qty 50

## 2019-03-03 MED ORDER — IOHEXOL 300 MG/ML  SOLN
100.0000 mL | Freq: Once | INTRAMUSCULAR | Status: AC | PRN
  Administered 2019-03-03: 100 mL via INTRAVENOUS

## 2019-03-03 MED FILL — PREMARIN VAGINAL CREAM-APPL: 0.625 | 30 days supply | Qty: 30 | Fill #1

## 2019-03-06 ENCOUNTER — Telehealth: Payer: Self-pay

## 2019-03-06 NOTE — Telephone Encounter (Signed)
Told Danielle Guzman that the CT scan shows no acute findings in the abdomen or pelvis. No findings to explain the patient's history of left lower quadrant pain per Danielle Cross,Danielle Guzman. The scan also showed an abdominal aortic atherosclerosis without aneurysm. Stable superior endplate compression fracture at T12. Lower chest scattered tiny pulomary nodules in both lung bases. Some present previously and other appear new in the interval. They are very tiny and radiologist recommends a non-contrast Chest CT in 12 months if she is a high risk patient. Pt does not have a PCP currently as she has changed insurance.  Told Danielle Guzman that this CT can be sent to new PCP once she has established care to follow findings. Pt verbalized understanding.

## 2019-03-09 ENCOUNTER — Other Ambulatory Visit: Payer: Self-pay

## 2019-03-09 DIAGNOSIS — R600 Localized edema: Secondary | ICD-10-CM

## 2019-03-09 MED ORDER — FUROSEMIDE 20 MG PO TABS: 20.0000 mg | ORAL_TABLET | Freq: Every day | ORAL | 3 refills | Status: DC | PRN

## 2019-03-09 NOTE — Telephone Encounter (Signed)
Prescription sent to [facility] on 02-24-19. Pt uses [facility]. Called [facility].  The medication was shipped to patient.  They will suspend this prescription.

## 2019-04-16 IMAGING — CT CT ABD-PELV W/ CM
2 of 5 series · 16 of 46 positions shown, 18 images · IV contrast (OMNIPAQUE)
Comparison: CT abdomen/pelvis dated 08/27/2016

CLINICAL DATA: Generalized abdominal pain radiating to flank.
History of uterine cancer status post chemotherapy.

EXAM:
CT ABDOMEN AND PELVIS WITH CONTRAST
TECHNIQUE: Multidetector CT imaging of the abdomen and pelvis was performed
using the standard protocol following bolus administration of
intravenous contrast.
CONTRAST:  100mL OMNIPAQUE IOHEXOL 300 MG/ML  SOLN

[Series 2: axial st · axial · 0.71mm/px · z∈[+1075,+1460]mm · 13 of 91 slices shown, 15 images]
[im 7/91  soft-tissue]
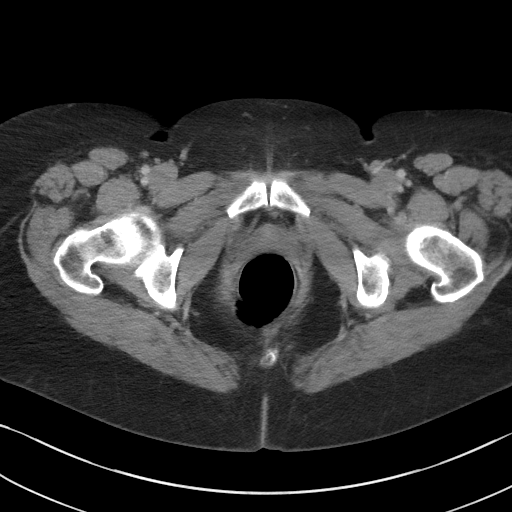
[im 7/91  bone]
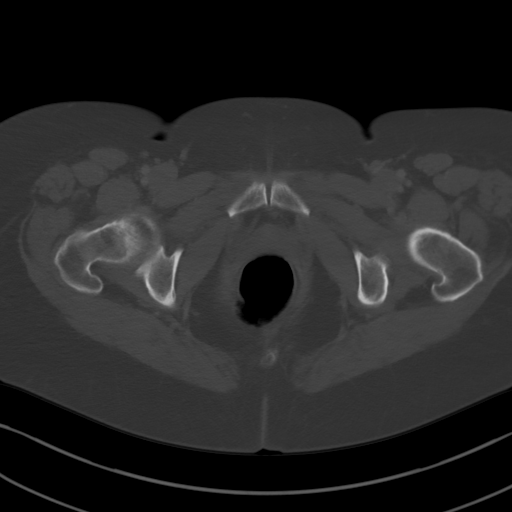
[im 13/91  soft-tissue]
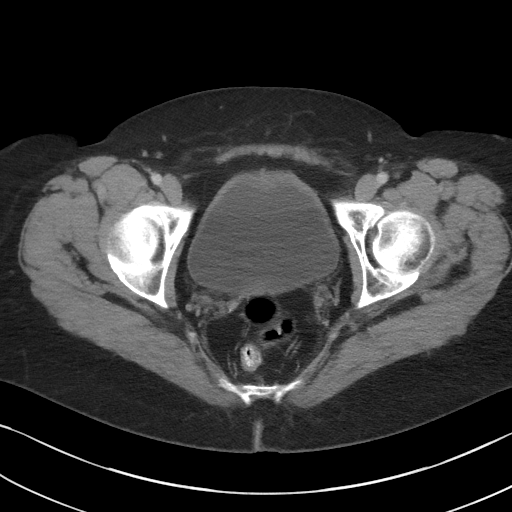
[im 20/91  soft-tissue]
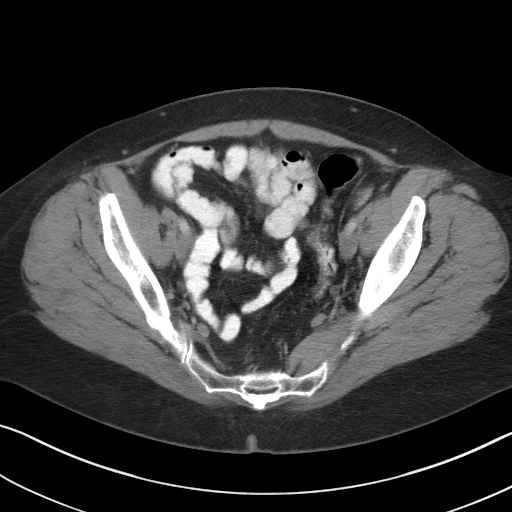
[im 26/91  soft-tissue]
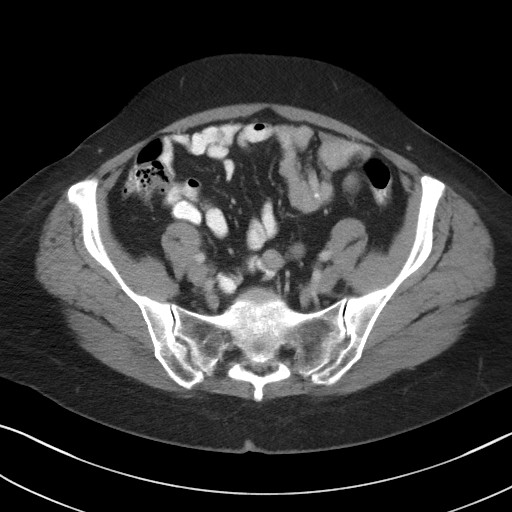
[im 33/91  soft-tissue]
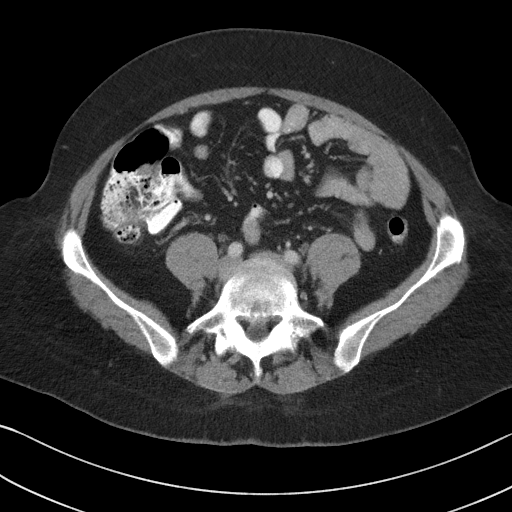
[im 39/91  soft-tissue]
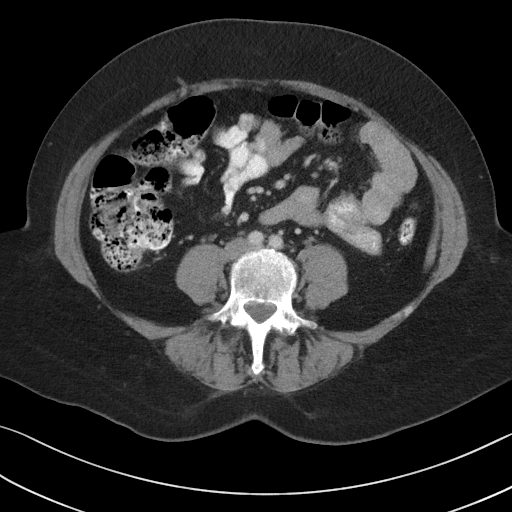
[im 46/91  soft-tissue]
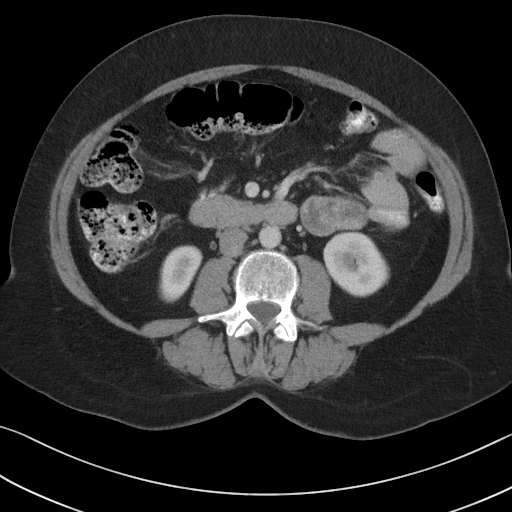
[im 52/91  soft-tissue]
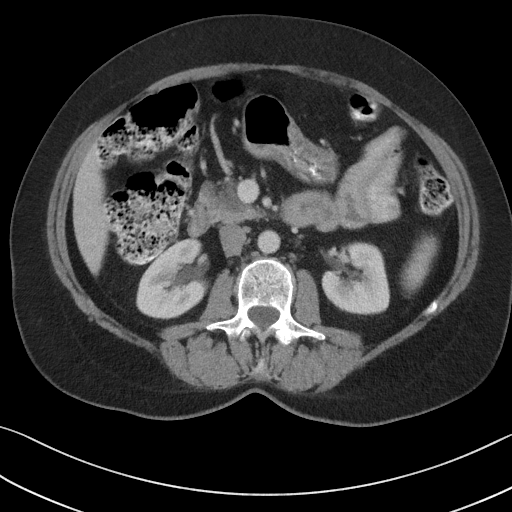
[im 58/91  soft-tissue]
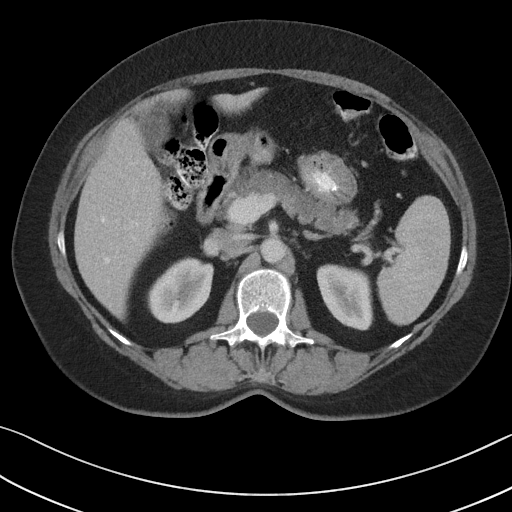
[im 58/91  bone]
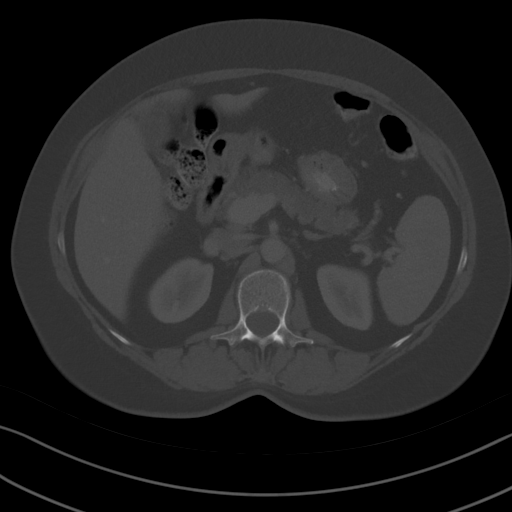
[im 65/91  soft-tissue]
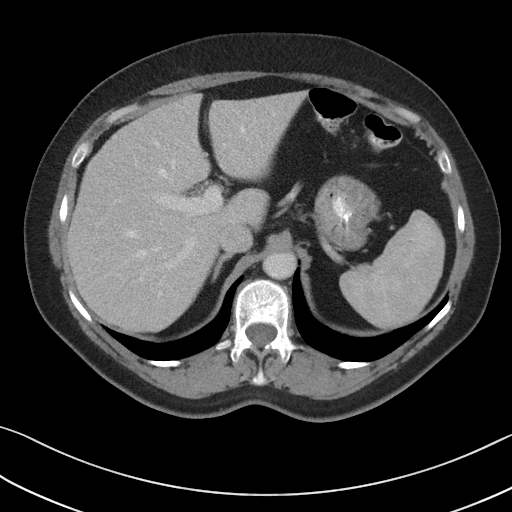
[im 71/91  soft-tissue]
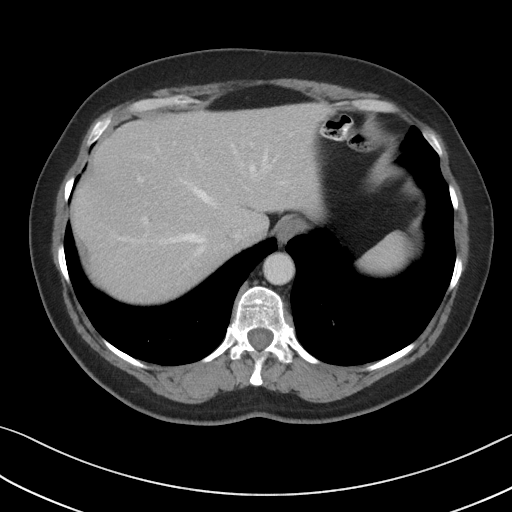
[im 78/91  soft-tissue]
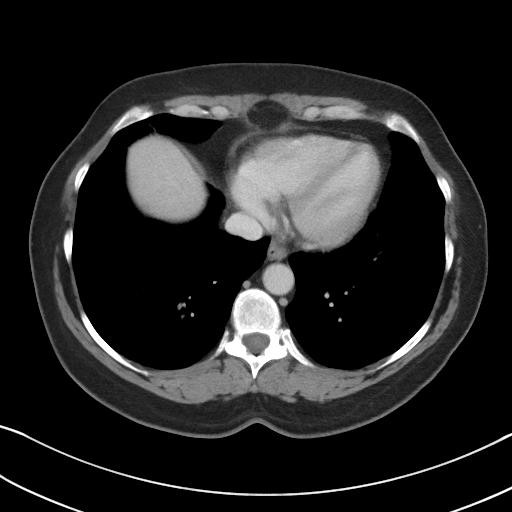
[im 84/91  soft-tissue]
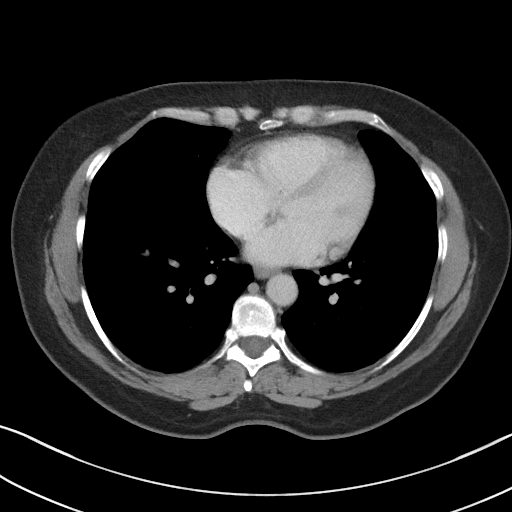

[Series 4: coronal st · coronal · 0.72mm/px · 3 of 90 slices shown]
[im 30/90  soft-tissue]
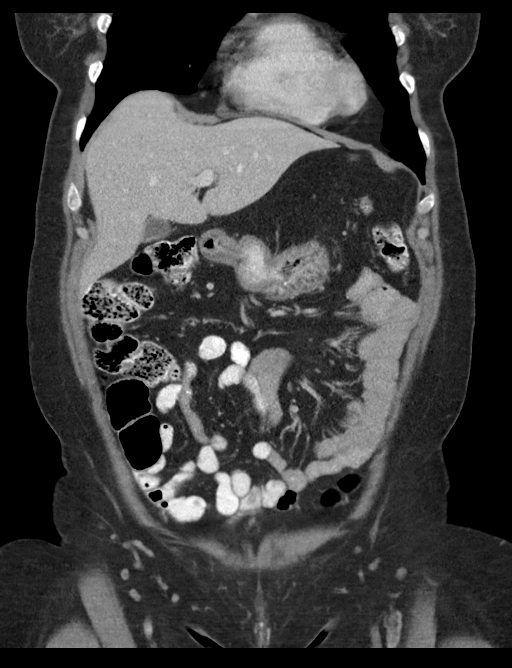
[im 40/90  soft-tissue]
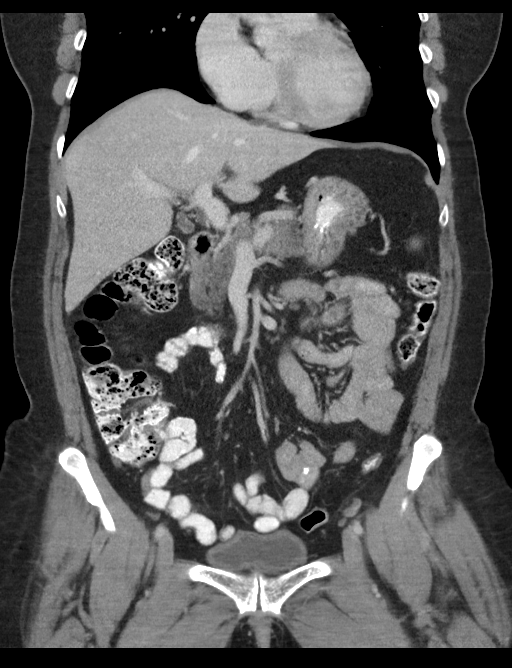
[im 50/90  soft-tissue]
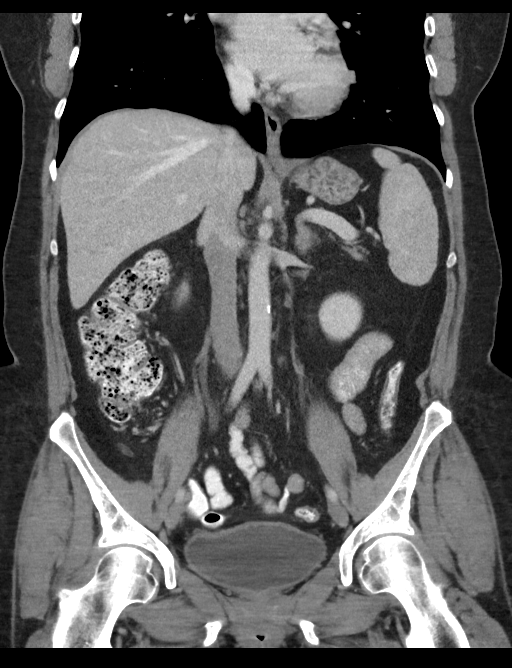

[16 of 46 positions shown; findings below may reference images not displayed]

FINDINGS: Lower chest: Lung bases are clear.

Hepatobiliary: Liver is within normal limits.

Gallbladder is unremarkable. No intrahepatic or extrahepatic ductal
dilatation.

Pancreas: Within normal limits.

Spleen: Within normal limits.

Adrenals/Urinary Tract: Adrenal glands are within normal limits.

Kidneys are within normal limits.  No hydronephrosis.

Mild anterior bladder wall thickening.

Stomach/Bowel: Stomach is within normal limits.

No evidence of bowel obstruction.

Normal appendix (series 2/image 64).

Vascular/Lymphatic: No evidence of abdominal aortic aneurysm.

No suspicious abdominopelvic lymphadenopathy.

Reproductive: Status post hysterectomy.

No adnexal masses.

Other: No abdominopelvic ascites. No frank peritoneal/omental
nodularity.

Musculoskeletal: Mild superior endplate compression fracture
deformity at T12 (sagittal image 53), new from 3833. No
retropulsion.
IMPRESSION: Status post hysterectomy.

No evidence of recurrent or metastatic disease.

Mild superior endplate compression fracture deformity at T12, new
from 3833. No retropulsion.

## 2019-08-25 ENCOUNTER — Inpatient Hospital Stay: Payer: 59 | Attending: Gynecologic Oncology | Admitting: Gynecologic Oncology

## 2019-08-25 ENCOUNTER — Other Ambulatory Visit: Payer: Self-pay

## 2019-08-25 ENCOUNTER — Encounter: Payer: Self-pay | Admitting: Gynecologic Oncology

## 2019-08-25 ENCOUNTER — Other Ambulatory Visit: Payer: Self-pay | Admitting: Gynecologic Oncology

## 2019-08-25 VITALS — BP 130/83 | HR 67 | Temp 98.5°F | Resp 20 | Ht 66.0 in | Wt 220.2 lb

## 2019-08-25 DIAGNOSIS — R252 Cramp and spasm: Secondary | ICD-10-CM | POA: Diagnosis not present

## 2019-08-25 DIAGNOSIS — Z8049 Family history of malignant neoplasm of other genital organs: Secondary | ICD-10-CM | POA: Insufficient documentation

## 2019-08-25 DIAGNOSIS — Z8542 Personal history of malignant neoplasm of other parts of uterus: Secondary | ICD-10-CM | POA: Diagnosis present

## 2019-08-25 DIAGNOSIS — R600 Localized edema: Secondary | ICD-10-CM | POA: Diagnosis not present

## 2019-08-25 DIAGNOSIS — Z87891 Personal history of nicotine dependence: Secondary | ICD-10-CM | POA: Insufficient documentation

## 2019-08-25 DIAGNOSIS — Z90710 Acquired absence of both cervix and uterus: Secondary | ICD-10-CM | POA: Diagnosis not present

## 2019-08-25 DIAGNOSIS — Z08 Encounter for follow-up examination after completed treatment for malignant neoplasm: Secondary | ICD-10-CM | POA: Diagnosis not present

## 2019-08-25 DIAGNOSIS — N952 Postmenopausal atrophic vaginitis: Secondary | ICD-10-CM | POA: Diagnosis not present

## 2019-08-25 DIAGNOSIS — Z9221 Personal history of antineoplastic chemotherapy: Secondary | ICD-10-CM | POA: Diagnosis not present

## 2019-08-25 DIAGNOSIS — Z800 Family history of malignant neoplasm of digestive organs: Secondary | ICD-10-CM | POA: Insufficient documentation

## 2019-08-25 DIAGNOSIS — Z79899 Other long term (current) drug therapy: Secondary | ICD-10-CM | POA: Diagnosis not present

## 2019-08-25 DIAGNOSIS — N941 Unspecified dyspareunia: Secondary | ICD-10-CM | POA: Diagnosis not present

## 2019-08-25 DIAGNOSIS — Z90722 Acquired absence of ovaries, bilateral: Secondary | ICD-10-CM | POA: Diagnosis not present

## 2019-08-25 DIAGNOSIS — Z803 Family history of malignant neoplasm of breast: Secondary | ICD-10-CM | POA: Insufficient documentation

## 2019-08-25 DIAGNOSIS — C541 Malignant neoplasm of endometrium: Secondary | ICD-10-CM

## 2019-08-25 MED ORDER — FUROSEMIDE 20 MG PO TABS: 20.0000 mg | ORAL_TABLET | Freq: Every day | ORAL | 3 refills | Status: DC | PRN

## 2019-08-25 NOTE — Patient Instructions (Signed)
Dr Denman George is making a referral for you to see Dr Illene Bolus at [facility] for evaluation and treatment of your pelvic pain and painful intercourse.  Please return to see Dr Denman George in December.

## 2019-08-25 NOTE — Progress Notes (Signed)
Follow-up Note: Gyn-Onc  Consult was requested by Dr. Kemper Durie and Dr Aldona Bar for the evaluation of Laurelyn Sickle 54 y.o. female  CC:  Chief Complaint  Patient presents with   Endometrial cancer [facility])    Follow up    Assessment/Plan:  Ms. KIZZIE COTTEN  is a 54 y.o.  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 MSI unstable.   She is s/p staging surgery on 09/01/16. And s/p adjuvat carboplatin and paclitaxel x 6 cycles between 11/12/2016 - 02/23/17.   No evidence of recurrence on exam or recent CT imaging.   Declined genetics consultation despite loss of MSH6 on IHC and strong family history of cancer  .   Pelvic floor pain with levator spasm and dyspareunia -  Plateau of progress with pelvic PT. Recommend referral to pelvic pain specialist, Dr Everlene Other.  Vulvovaginal atrophy - vaginal premarin 3 times per week.  Edema of lower extremities - lasix reordered Recommended colonoscopy screening. Counseled regarding increased risk for colon cancer given her MSI high endometrial cancer and my concern for occult Lynch syndrome. She declined this.   HPI: Nikiya Starn is a 54 year old [id number] who is seen in consultation at the request of Dr Aldona Bar and Dr Marlow Baars for endometrial cancer.  The patient reports early menopause at age 59 (all of her female relatives have had early menopause). She began experiencing postmenopausal bleeding in March 2018. She was seen by Dr Aldona Bar on 06/02/16 and a TVUS showed an endometrium of 1.8cm with a 3cm hypervascular polyp.  She was taken to the OR on 08/11/16 for a hysteroscopy/D&C with Dr Marlow Baars and the specimen showed endometrial adenocarcinoma with the majority of the specimen revealing grade 2 endometrioid endometrial cancer with a small focus (5%) of clear cell carcinoma and a few foci suspicious for serous carcinoma.  She is otherwise very healthy. She has had no prior surgeries or vaginal  deliveries.  Her mother has a history of breast cancer x 3, she has a sister with a history of cervical cancer. Her maternal aunt has a history of breast cancer. A different maternal aunt had uterine and colon cancer. Her maternal cousin had uterine cancer. Her paternal grandmother had a diagnosis of colon cancer and her paternal cousins had hysterectomies for "abnromal cells" but no cancer.  On 09/01/16 she underwent robotic assisted total hysterectomy, BSO, SLN biopsy. Surgery was uncomplicated. The final pathology revealed a 1.8cm grade 2 endometrioid endometrial cancer ( with rare foci of squamous differentiation and a rare microscopic focus of serous differentiation). There was luminal foci of carcinoma within the right and left fallopian tubes and the right fallopian tube lumen involvement showed a focus of similar appearing chondroid stroma with adjacent focus of serous carcinoma.  The lymph nodes and cervix were negative for carcinoma. The uterine tumor showed no myometrial invasion and no LVSI. Final pathology revealed loss of MSH6 on IHC.   Declined genetics consultation despite counseling that she may have Lynch syndrome which increases her risk for other cancers (that might be detected early or prevented with more frequent screenings that can be facilitated with this diagnosis).  Postoperatively she completed 6 cycles of carboplatin and paclitaxel between 11/12/17-12/11/9 after experiencing some trepidation regarding toxicity overwhelming benefits.   She has some persistent foot neuropathy post-chemotherapy and has some bilateral lower extremity edema. She has seen PT for neuropathy and pelvic pains.  Her friend recently died from  advanced uterine cancer.  She has back and abdominal pain which prompted a CT abd/pelvis on February 28, 2018.  This showed mild superior endplate compression fracture deformity of T12 which was new from 2018, no retropulsion.  There is no evidence of recurrent  or metastatic disease.  She had been seeing a chiropractor for this and feels that this is improved her symptoms substantially.  CT abd/pelvis in December, 2020 was ordered due to pelvic pain symptoms. This was negative for evidence of recurrence.   Interval Hx: The patient reports persistent dyspareunia despite working for several years with pelvic PT. She desires further evaluation/treatment of this. She has no symptoms concerning for recurrence.   Current Meds:  Outpatient Encounter Medications as of 08/25/2019  Medication Sig   APPLE CIDER VINEGAR PO Take 1 Dose by mouth daily. Patient states she takes Gaffer 1tsp. Daily.   Ascorbic Acid (VITAMIN C PO) Take 1 tablet by mouth 3 (three) times daily as needed (for immune health support or cold symptoms.).   b complex vitamins tablet Take 1 tablet by mouth daily.   calcium citrate (CALCITRATE - DOSED IN MG ELEMENTAL CALCIUM) 950 MG tablet Take 200 mg of elemental calcium by mouth daily. Patient states she takes 6 pills a day.   conjugated estrogens (PREMARIN) vaginal cream Place 1 Applicatorful vaginally 3 (three) times a week. At night   furosemide (LASIX) 20 MG tablet Take 1 tablet (20 mg total) by mouth daily as needed.   ibuprofen (ADVIL) 200 MG tablet Take 200-400 mg by mouth every 8 (eight) hours as needed.   Omega-3 Fatty Acids (FISH OIL) 1000 MG CAPS Take 1 capsule by mouth daily.    OVER THE COUNTER MEDICATION Take 1 tablet by mouth daily. Tumeric once a day.   Pediatric Multiple Vit-Vit C (VITAMIN DAILY PO) Take 10,000 Units by mouth daily.   Probiotic Product (PROBIOTIC DAILY PO) Take 1 capsule by mouth daily.   Vitamin D, Ergocalciferol, (DRISDOL) 50000 units CAPS capsule Take 1 capsule (50,000 Units total) by mouth every 7 (seven) days.   [DISCONTINUED] furosemide (LASIX) 20 MG tablet Take 1 tablet (20 mg total) by mouth daily as needed.   No facility-administered encounter medications on file as of  08/25/2019.    Allergy:  Allergies  Allergen Reactions   Latex     Long exposure causes a rash   Tamiflu  [Oseltamivir Phosphate] Nausea Only    Social Hx:   Social History   Socioeconomic History   Marital status: Married    Spouse name: [name]   Number of children: 0   Years of education: Not on file   Highest education level: Not on file  Occupational History   Occupation: massage therapist  Tobacco Use   Smoking status: Former Smoker    Packs/day: 1.50    Years: 8.00    Pack years: 12.00    Types: Cigarettes   Smokeless tobacco: Never Used   Tobacco comment: quit smoking at age 48  Vaping Use   Vaping Use: Never used  Substance and Sexual Activity   Alcohol use: Yes    Alcohol/week: 4.0 standard drinks    Types: 4 Glasses of wine per week    Comment: wine occ   Drug use: No   Sexual activity: Yes    Birth control/protection: Post-menopausal  Other Topics Concern   Not on file  Social History Narrative   Not on file   Social Determinants of Health  Financial Resource Strain:    Difficulty of Paying Living Expenses:   Food Insecurity:    Worried About Programme researcher, broadcasting/film/video in the Last Year:    Barista in the Last Year:   Transportation Needs:    Freight forwarder (Medical):    Lack of Transportation (Non-Medical):   Physical Activity:    Days of Exercise per Week:    Minutes of Exercise per Session:   Stress:    Feeling of Stress :   Social Connections:    Frequency of Communication with Friends and Family:    Frequency of Social Gatherings with Friends and Family:    Attends Religious Services:    Active Member of Clubs or Organizations:    Attends Engineer, structural:    Marital Status:   Intimate Partner Violence:    Fear of Current or Ex-Partner:    Emotionally Abused:    Physically Abused:    Sexually Abused:     Past Surgical Hx:  Past Surgical History:  Procedure  Laterality Date   DENTAL SURGERY     gum graft 20 yrs ago and again on 08/26/16   DILATATION & CURETTAGE/HYSTEROSCOPY WITH MYOSURE N/A 08/11/2016   Procedure: DILATATION & CURETTAGE/HYSTEROSCOPY WITH MYOSURE;  Surgeon: Marlow Baars, MD;  Location: [facility];  Service: Gynecology;  Laterality: N/A;  polyp removal   IR FLUORO GUIDE PORT INSERTION RIGHT  11/10/2016   IR REMOVAL TUN ACCESS W/ PORT W/O FL MOD SED  03/11/2017   IR US GUIDE VASC ACCESS RIGHT  11/10/2016   LYMPH NODE BIOPSY N/A 09/01/2016   Procedure: SENTINEL LYMPH NODE BIOPSY;  Surgeon: Adolphus Birchwood, MD;  Location: [facility];  Service: Gynecology;  Laterality: N/A;   ROBOTIC ASSISTED TOTAL HYSTERECTOMY WITH BILATERAL SALPINGO OOPHERECTOMY Bilateral 09/01/2016   Procedure: ROBOTIC ASSISTED TOTAL HYSTERECTOMY WITH BILATERAL SALPINGO OOPHORECTOMY;  Surgeon: Adolphus Birchwood, MD;  Location: [facility];  Service: Gynecology;  Laterality: Bilateral;   WISDOM TOOTH EXTRACTION      Past Medical Hx:  Past Medical History:  Diagnosis Date   Cancer (HCC) 2018   endometrial    Compression fx, lumbar spine (HCC)    Compression Fracture T12   Headache    hx migraines yrs ago   Malaria as child   Miscarriage    x 2   Pneumonia yrs ago   Seizures (HCC)    petite mal seizure in high school x 1, none since   Sinus infection 08/2017    Past Gynecological History:  Premature menopause No LMP recorded. Patient has had a hysterectomy.  Family Hx:  Family History  Problem Relation Age of Onset   Cancer Mother    Cancer Sister        Cervix   Cancer Maternal Aunt        Breast, uterine   Cancer Paternal Grandmother        Colon    Review of Systems:  Constitutional  Feels well,    ENT Normal appearing ears and nares bilaterally Skin/Breast  No rash, sores, jaundice, itching, dryness Cardiovascular   No chest pain, shortness of breath, or edema  Pulmonary  No cough or wheeze.  Gastro Intestinal  No nausea, vomitting, or  diarrhoea. No bright red blood per rectum, + left abdominal pain, change in bowel movement, or constipation.  Genito Urinary  No frequency, urgency, dysuria, no bleeding Musculo Skeletal  + bilateral LE edema Neurologic  + foot neuropathy bilaterally  Psychology  No depression, anxiety, insomnia.   Vitals:  Blood pressure 130/83, pulse 67, temperature 98.5 F (36.9 C), temperature source Oral, resp. rate 20, height _0  (1.676 m), weight 220 lb 3.2 oz (99.9 kg), SpO2 100 %.  Physical Exam: WD in NAD Neck  Supple NROM, without any enlargements.  Lymph Node Survey No cervical supraclavicular or inguinal adenopathy Cardiovascular  Pulse normal rate, regularity and rhythm. S1 and S2 normal.  Lungs  Clear to auscultation bilateraly, without wheezes/crackles/rhonchi. Good air movement.  Skin  No rash/lesions/breakdown  Psychiatry  Alert and oriented to person, place, and time  Abdomen  Normoactive bowel sounds, abdomen soft, and obese without evidence of hernia. Soft incisions. Tenderness deep in the left side between ASIS and rib cage. No associated mass palpable.  Back No CVA tenderness Genito Urinary  :vaginal cuff normal with no lesions. No masses. No levator spasm. There was some erythema of the introitus and cuff consistent with vulvovaginal candidasis. Thin atrophic vaginal tissues.  Rectal  deferred Extremities  No bilateral cyanosis, clubbing , mild edema lower extremities.  Thereasa Solo, MD  08/25/2019, 4:39 PM

## 2019-09-01 ENCOUNTER — Telehealth: Payer: Self-pay | Admitting: *Deleted

## 2019-09-01 NOTE — Telephone Encounter (Signed)
Faxed referral and records to Dr Pablo Ledger at [facility]

## 2019-09-08 ENCOUNTER — Telehealth: Payer: Self-pay | Admitting: *Deleted

## 2019-09-08 NOTE — Telephone Encounter (Signed)
Called and left the patient a message to call Dr Pablo Ledger office at [facility] [phone]. Explained to call our office back with any questions

## 2019-09-11 ENCOUNTER — Telehealth: Payer: Self-pay | Admitting: *Deleted

## 2019-09-11 NOTE — Telephone Encounter (Signed)
Called and gave the patient the alternate number for Dr Myriam Jacobson number [phone]

## 2019-09-14 ENCOUNTER — Telehealth: Payer: Self-pay | Admitting: *Deleted

## 2019-09-14 NOTE — Telephone Encounter (Signed)
Patient is scheduled to see Dr Pablo Ledger on 10/15

## 2019-09-15 ENCOUNTER — Telehealth: Payer: Self-pay | Admitting: *Deleted

## 2019-09-15 NOTE — Telephone Encounter (Signed)
Patient called and wanted to ask either Dr Denman George or Lenna Sciara APP "if I had to have a pelvic exam with Dr Hiram Comber or would it be virtual visit first. She can be seen sooner if it's a virtual visit."

## 2019-10-30 ENCOUNTER — Ambulatory Visit
Admission: EM | Admit: 2019-10-30 | Discharge: 2019-10-30 | Disposition: A | Discharge status: Home / Self Care | Payer: 59 | Attending: Emergency Medicine | Admitting: Emergency Medicine

## 2019-10-30 ENCOUNTER — Other Ambulatory Visit: Payer: Self-pay

## 2019-10-30 DIAGNOSIS — H60312 Diffuse otitis externa, left ear: Secondary | ICD-10-CM

## 2019-10-30 DIAGNOSIS — H66001 Acute suppurative otitis media without spontaneous rupture of ear drum, right ear: Secondary | ICD-10-CM | POA: Diagnosis not present

## 2019-10-30 MED ORDER — CIPROFLOXACIN-DEXAMETHASONE 0.3-0.1 % OT SUSP: 4.0000 [drp] | Freq: Two times a day (BID) | OTIC | 0 refills | Status: AC

## 2019-10-30 MED ORDER — AMOXICILLIN 500 MG PO CAPS: 500.0000 mg | ORAL_CAPSULE | Freq: Two times a day (BID) | ORAL | 0 refills | Status: AC

## 2019-10-30 NOTE — ED Provider Notes (Signed)
[place]   [id number] 10/30/19 Arrival Time: 1532  CC:EAR PAIN  SUBJECTIVE: History from: patient.  Danielle Guzman is a 54 y.o. female who presents with of RT ear pain x 3 days.  Denies a precipitating event, such as swimming or wearing ear plugs.  Patient states the pain is constant and 7/10.  Denies alleviating factors.  Symptoms are made worse at night.  Denies similar symptoms in the past.    Complains of drainage and decreased hearing in LT ear, but these symptoms are chronic in nature.  Denies fever, chills, fatigue, sinus pain, rhinorrhea, sore throat, SOB, wheezing, chest pain, nausea, changes in bowel or bladder habits.    ROS: As per HPI.  All other pertinent ROS negative.     Past Medical History:  Diagnosis Date  . Cancer ([place]) 2018   endometrial   . Compression fx, lumbar spine (HCC)    Compression Fracture T12  . Headache    hx migraines yrs ago  . Malaria as child  . Miscarriage    x 2  . Pneumonia yrs ago  . Seizures ([place])    petite mal seizure in high school x 1, none since  . Sinus infection 08/2017   Past Surgical History:  Procedure Laterality Date  . DENTAL SURGERY     gum graft 20 yrs ago and again on 08/26/16  . DILATATION & CURETTAGE/HYSTEROSCOPY WITH MYOSURE N/A 08/11/2016   Procedure: DILATATION & CURETTAGE/HYSTEROSCOPY WITH MYOSURE;  Surgeon: Jerelyn Charles, MD;  Location: [facility];  Service: Gynecology;  Laterality: N/A;  polyp removal  . IR FLUORO GUIDE PORT INSERTION RIGHT  11/10/2016  . IR REMOVAL TUN ACCESS W/ PORT W/O FL MOD SED  03/11/2017  . IR US GUIDE VASC ACCESS RIGHT  11/10/2016  . LYMPH NODE BIOPSY N/A 09/01/2016   Procedure: SENTINEL LYMPH NODE BIOPSY;  Surgeon: Everitt Amber, MD;  Location: [facility];  Service: Gynecology;  Laterality: N/A;  . ROBOTIC ASSISTED TOTAL HYSTERECTOMY WITH BILATERAL SALPINGO OOPHERECTOMY Bilateral 09/01/2016   Procedure: ROBOTIC ASSISTED TOTAL HYSTERECTOMY WITH BILATERAL SALPINGO OOPHORECTOMY;  Surgeon:  Everitt Amber, MD;  Location: [facility];  Service: Gynecology;  Laterality: Bilateral;  . WISDOM TOOTH EXTRACTION     Allergies  Allergen Reactions  . Latex     Long exposure causes a rash  . Tamiflu  [Oseltamivir Phosphate] Nausea Only   No current facility-administered medications on file prior to encounter.   Current Outpatient Medications on File Prior to Encounter  Medication Sig Dispense Refill  . APPLE CIDER VINEGAR PO Take 1 Dose by mouth daily. Patient states she takes Gaffer 1tsp. Daily.    . Ascorbic Acid (VITAMIN C PO) Take 1 tablet by mouth 3 (three) times daily as needed (for immune health support or cold symptoms.).    Marland Kitchen b complex vitamins tablet Take 1 tablet by mouth daily.    . calcium citrate (CALCITRATE - DOSED IN MG ELEMENTAL CALCIUM) 950 MG tablet Take 200 mg of elemental calcium by mouth daily. Patient states she takes 6 pills a day.    . conjugated estrogens (PREMARIN) vaginal cream Place 1 Applicatorful vaginally 3 (three) times a week. At night 42.5 g 12  . furosemide (LASIX) 20 MG tablet Take 1 tablet (20 mg total) by mouth daily as needed. 30 tablet 3  . ibuprofen (ADVIL) 200 MG tablet Take 200-400 mg by mouth every 8 (eight) hours as needed.    . Omega-3 Fatty Acids (FISH OIL) 1000  MG CAPS Take 1 capsule by mouth daily.     Marland Kitchen OVER THE COUNTER MEDICATION Take 1 tablet by mouth daily. Tumeric once a day.    . Pediatric Multiple Vit-Vit C (VITAMIN DAILY PO) Take 10,000 Units by mouth daily.    . Probiotic Product (PROBIOTIC DAILY PO) Take 1 capsule by mouth daily.    . Vitamin D, Ergocalciferol, (DRISDOL) 50000 units CAPS capsule Take 1 capsule (50,000 Units total) by mouth every 7 (seven) days. 12 capsule 3   Social History   Socioeconomic History  . Marital status: Married    Spouse name: [name]  . Number of children: 0  . Years of education: Not on file  . Highest education level: Not on file  Occupational History  . Occupation: massage  therapist  Tobacco Use  . Smoking status: Former Smoker    Packs/day: 1.50    Years: 8.00    Pack years: 12.00    Types: Cigarettes  . Smokeless tobacco: Never Used  . Tobacco comment: quit smoking at age 89  Vaping Use  . Vaping Use: Never used  Substance and Sexual Activity  . Alcohol use: Yes    Alcohol/week: 4.0 standard drinks    Types: 4 Glasses of wine per week    Comment: wine occ  . Drug use: No  . Sexual activity: Yes    Birth control/protection: Post-menopausal  Other Topics Concern  . Not on file  Social History Narrative  . Not on file   Social Determinants of Health   Financial Resource Strain:   . Difficulty of Paying Living Expenses:   Food Insecurity:   . Worried About Charity fundraiser in the Last Year:   . Arboriculturist in the Last Year:   Transportation Needs:   . Film/video editor (Medical):   Marland Kitchen Lack of Transportation (Non-Medical):   Physical Activity:   . Days of Exercise per Week:   . Minutes of Exercise per Session:   Stress:   . Feeling of Stress :   Social Connections:   . Frequency of Communication with Friends and Family:   . Frequency of Social Gatherings with Friends and Family:   . Attends Religious Services:   . Active Member of Clubs or Organizations:   . Attends Archivist Meetings:   Marland Kitchen Marital Status:   Intimate Partner Violence:   . Fear of Current or Ex-Partner:   . Emotionally Abused:   Marland Kitchen Physically Abused:   . Sexually Abused:    Family History  Problem Relation Age of Onset  . Cancer Mother   . Cancer Sister        Cervix  . Cancer Maternal Aunt        Breast, uterine  . Cancer Paternal Grandmother        Colon    OBJECTIVE:  Vitals:   10/30/19 1611  BP: 119/83  Pulse: 70  Resp: 18  Temp: 98.5 F (36.9 C)  SpO2: 96%     General appearance: alert; well-appearing, nontoxic; speaking in full sentences and tolerating own secretions HEENT: NCAT; Ears: RT EAC clear, LT EAC erythematous,  RT TM erythematous, LT TM pearly gray; Eyes: PERRL.  EOM grossly intact.Nose: nares patent without rhinorrhea, Throat: oropharynx clear, tonsils non erythematous or enlarged, uvula midline  Neck: supple without LAD Lungs: unlabored respirations, symmetrical air entry; cough: absent; no respiratory distress; CTAB Heart: regular rate and rhythm.  Skin: warm and dry Psychological: alert  and cooperative; normal mood and affect    ASSESSMENT & PLAN:  1. Non-recurrent acute suppurative otitis media of right ear without spontaneous rupture of tympanic membrane   2. Acute diffuse otitis externa of left ear     Meds ordered this encounter  Medications  . amoxicillin (AMOXIL) 500 MG capsule    Sig: Take 1 capsule (500 mg total) by mouth 2 (two) times daily for 10 days.    Dispense:  20 capsule    Refill:  0    Order Specific Question:   Supervising Provider    Answer:   Raylene Everts [[id number]]  . ciprofloxacin-dexamethasone (CIPRODEX) OTIC suspension    Sig: Place 4 drops into both ears 2 (two) times daily for 7 days.    Dispense:  7.5 mL    Refill:  0    Order Specific Question:   Supervising Provider    Answer:   Raylene Everts [[id number]]    Rest and drink plenty of fluids Prescribed amoxicillin  Prescribed ciprofloxacin ear drops Take medications as directed and to completion Continue to use OTC ibuprofen and/ or tylenol as needed for pain control Follow up with PCP if symptoms persists Return here or go to the ER if you have any new or worsening symptoms fever, chills, nausea, vomiting, redness, swelling, discharge, etc...   Reviewed expectations re: course of current medical issues. Questions answered. Outlined signs and symptoms indicating need for more acute intervention. Patient verbalized understanding. After Visit Summary given.         Lestine Box, PA-C 10/30/19 1644

## 2019-10-30 NOTE — ED Triage Notes (Signed)
Pt presents with ear pain and drainage for past 3 days

## 2019-10-30 NOTE — Discharge Instructions (Addendum)
Rest and drink plenty of fluids Prescribed amoxicillin  Prescribed ciprofloxacin ear drops Take medications as directed and to completion Continue to use OTC ibuprofen and/ or tylenol as needed for pain control Follow up with PCP if symptoms persists Return here or go to the ER if you have any new or worsening symptoms fever, chills, nausea, vomiting, redness, swelling, discharge, etc..Marland Kitchen

## 2019-12-29 ENCOUNTER — Other Ambulatory Visit (HOSPITAL_COMMUNITY): Payer: Self-pay | Admitting: Obstetrics and Gynecology

## 2020-01-04 MED FILL — FLUCONAZOLE 150 MG TABS: 150 | 1 days supply | Qty: 1 | Fill #1

## 2020-01-04 MED FILL — FUROSEMIDE 20 MG TABS: 20 | 30 days supply | Qty: 30 | Fill #1

## 2020-01-29 MED FILL — FLUCONAZOLE 150 MG TABS: 150 | 1 days supply | Qty: 1 | Fill #2

## 2020-01-29 MED FILL — ESTRADIOL 10 MCG TABS: 10 | 28 days supply | Qty: 8 | Fill #0

## 2020-01-31 ENCOUNTER — Ambulatory Visit: Payer: 59 | Admitting: Physical Therapy

## 2020-03-06 ENCOUNTER — Ambulatory Visit: Payer: 59 | Admitting: Physical Therapy

## 2020-03-11 NOTE — Progress Notes (Signed)
Follow-up Note: Gyn-Onc  Consult was requested by Dr. Loletta Specter and Dr Stann Mainland for the evaluation of Danielle Guzman 54 y.o. female  CC:  Chief Complaint  Patient presents with  . Endometrial cancer [facility])   Assessment/Plan:  Danielle Guzman  is a 54 y.o.  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 MSI unstable.   She is s/p staging surgery on 09/01/16. And s/p adjuvat carboplatin and paclitaxel x 6 cycles between 11/12/2016 - 02/23/17.   No evidence of recurrence on exam or recent CT imaging.   Declined genetics consultation despite loss of MSH6 on IHC and strong family history of cancer  .   Pelvic floor pain with levator spasm and dyspareunia -  Seeing Dr Illene Bolus and PT. Using vaginal estrogen tablets  Edema of lower extremities - lasix prn  We will refill her vaginal estrogen tablets, diflucan and lasix as needed.   Recommended colonoscopy screening. Counseled regarding increased risk for colon cancer given her MSI high endometrial cancer and my concern for occult Lynch syndrome. She declined this.   HPI: Danielle Guzman is a 54 year old [id number] who is seen in consultation at the request of Dr Stann Mainland and Dr Jerelyn Charles for endometrial cancer.  The patient reports early menopause at age 86 (all of her female relatives have had early menopause). She began experiencing postmenopausal bleeding in March 2018. She was seen by Dr Stann Mainland on 06/02/16 and a TVUS showed an endometrium of 1.8cm with a 3cm hypervascular polyp.  She was taken to the OR on 08/11/16 for a hysteroscopy/D&C with Dr Jerelyn Charles and the specimen showed endometrial adenocarcinoma with the majority of the specimen revealing grade 2 endometrioid endometrial cancer with a small focus (5%) of clear cell carcinoma and a few foci suspicious for serous carcinoma.  She is otherwise very healthy. She has had no prior surgeries or vaginal deliveries.  Her mother has a history of  breast cancer x 3, she has a sister with a history of cervical cancer. Her maternal aunt has a history of breast cancer. A different maternal aunt had uterine and colon cancer. Her maternal cousin had uterine cancer. Her paternal grandmother had a diagnosis of colon cancer and her paternal cousins had hysterectomies for "abnromal cells" but no cancer.  On 09/01/16 she underwent robotic assisted total hysterectomy, BSO, SLN biopsy. Surgery was uncomplicated. The final pathology revealed a 1.8cm grade 2 endometrioid endometrial cancer ( with rare foci of squamous differentiation and a rare microscopic focus of serous differentiation). There was luminal foci of carcinoma within the right and left fallopian tubes and the right fallopian tube lumen involvement showed a focus of similar appearing chondroid stroma with adjacent focus of serous carcinoma.  The lymph nodes and cervix were negative for carcinoma. The uterine tumor showed no myometrial invasion and no LVSI. Final pathology revealed loss of MSH6 on IHC.   Declined genetics consultation despite counseling that she may have Lynch syndrome which increases her risk for other cancers (that might be detected early or prevented with more frequent screenings that can be facilitated with this diagnosis).  Postoperatively she completed 6 cycles of carboplatin and paclitaxel between 11/12/17-12/11/9 after experiencing some trepidation regarding toxicity overwhelming benefits.   She has some persistent foot neuropathy post-chemotherapy and has some bilateral lower extremity edema. She has seen PT for neuropathy and pelvic pains.  Her friend recently died from advanced uterine cancer.  She has  back and abdominal pain which prompted a CT abd/pelvis on February 28, 2018.  This showed mild superior endplate compression fracture deformity of T12 which was new from 2018, no retropulsion.  There is no evidence of recurrent or metastatic disease.  She had been  seeing a chiropractor for this and feels that this is improved her symptoms substantially.  CT abd/pelvis in December, 2020 was ordered due to pelvic pain symptoms. This was negative for evidence of recurrence.   Interval Hx: She saw Dr. Illene Bolus for symptoms of dyspareunia and pelvic pain.  Dr. Hiram Comber recommended vaginal estrogen tablets and Botox injections.  She is planning to explore having these performed in [place] or alternatively at [facility] with Dr. Hiram Comber.  She is also reinitiating pelvic PT with Earlie Counts.  She has no symptoms concerning for recurrence.  Current Meds:  Outpatient Encounter Medications as of 03/14/2020  Medication Sig  . APPLE CIDER VINEGAR PO Take 1 Dose by mouth daily. Patient states she takes Gaffer 1tsp. Daily.  . Ascorbic Acid (VITAMIN C PO) Take 1 tablet by mouth 3 (three) times daily as needed (for immune health support or cold symptoms.).  Marland Kitchen b complex vitamins tablet Take 1 tablet by mouth daily.  . calcium citrate (CALCITRATE - DOSED IN MG ELEMENTAL CALCIUM) 950 MG tablet Take 200 mg of elemental calcium by mouth daily. Patient states she takes 6 pills a day.  . Estradiol 10 MCG TABS vaginal tablet Place 1 tablet vaginally 2 (two) times a week.  . furosemide (LASIX) 20 MG tablet Take 1 tablet (20 mg total) by mouth daily as needed.  Marland Kitchen ibuprofen (ADVIL) 200 MG tablet Take 200-400 mg by mouth every 8 (eight) hours as needed.  . Omega-3 Fatty Acids (FISH OIL) 1000 MG CAPS Take 1 capsule by mouth daily.   Marland Kitchen OVER THE COUNTER MEDICATION Take 1 tablet by mouth daily. Tumeric once a day.  . Pediatric Multiple Vit-Vit C (VITAMIN DAILY PO) Take 10,000 Units by mouth daily.  . Probiotic Product (PROBIOTIC DAILY PO) Take 1 capsule by mouth daily.  . Vitamin D, Ergocalciferol, (DRISDOL) 50000 units CAPS capsule Take 1 capsule (50,000 Units total) by mouth every 7 (seven) days.  . [DISCONTINUED] conjugated estrogens (PREMARIN) vaginal cream Place 1  Applicatorful vaginally 3 (three) times a week. At night   No facility-administered encounter medications on file as of 03/14/2020.    Allergy:  Allergies  Allergen Reactions  . Latex     Long exposure causes a rash  . Tamiflu  [Oseltamivir Phosphate] Nausea Only    Social Hx:   Social History   Socioeconomic History  . Marital status: Married    Spouse name: [name]  . Number of children: 0  . Years of education: Not on file  . Highest education level: Not on file  Occupational History  . Occupation: massage therapist  Tobacco Use  . Smoking status: Former Smoker    Packs/day: 1.50    Years: 8.00    Pack years: 12.00    Types: Cigarettes  . Smokeless tobacco: Never Used  . Tobacco comment: quit smoking at age 33  Vaping Use  . Vaping Use: Never used  Substance and Sexual Activity  . Alcohol use: Yes    Alcohol/week: 4.0 standard drinks    Types: 4 Glasses of wine per week    Comment: wine occ  . Drug use: No  . Sexual activity: Yes    Birth control/protection: Post-menopausal  Other  Topics Concern  . Not on file  Social History Narrative  . Not on file   Social Determinants of Health   Financial Resource Strain: Not on file  Food Insecurity: Not on file  Transportation Needs: Not on file  Physical Activity: Not on file  Stress: Not on file  Social Connections: Not on file  Intimate Partner Violence: Not on file    Past Surgical Hx:  Past Surgical History:  Procedure Laterality Date  . DENTAL SURGERY     gum graft 20 yrs ago and again on 08/26/16  . DILATATION & CURETTAGE/HYSTEROSCOPY WITH MYOSURE N/A 08/11/2016   Procedure: DILATATION & CURETTAGE/HYSTEROSCOPY WITH MYOSURE;  Surgeon: Jerelyn Charles, MD;  Location: [facility];  Service: Gynecology;  Laterality: N/A;  polyp removal  . IR FLUORO GUIDE PORT INSERTION RIGHT  11/10/2016  . IR REMOVAL TUN ACCESS W/ PORT W/O FL MOD SED  03/11/2017  . IR US GUIDE VASC ACCESS RIGHT  11/10/2016  . LYMPH NODE  BIOPSY N/A 09/01/2016   Procedure: SENTINEL LYMPH NODE BIOPSY;  Surgeon: Everitt Amber, MD;  Location: [facility];  Service: Gynecology;  Laterality: N/A;  . ROBOTIC ASSISTED TOTAL HYSTERECTOMY WITH BILATERAL SALPINGO OOPHERECTOMY Bilateral 09/01/2016   Procedure: ROBOTIC ASSISTED TOTAL HYSTERECTOMY WITH BILATERAL SALPINGO OOPHORECTOMY;  Surgeon: Everitt Amber, MD;  Location: [facility];  Service: Gynecology;  Laterality: Bilateral;  . WISDOM TOOTH EXTRACTION      Past Medical Hx:  Past Medical History:  Diagnosis Date  . Cancer ([place]) 2018   endometrial   . Compression fx, lumbar spine (HCC)    Compression Fracture T12  . Headache    hx migraines yrs ago  . Malaria as child  . Miscarriage    x 2  . Pneumonia yrs ago  . 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 Sinus infection 08/2017    Past Gynecological History:  Premature menopause No LMP recorded. Patient has had a hysterectomy.  Family Hx:  Family History  Problem Relation Age of Onset  . Cancer Mother   . Cancer Sister        Cervix  . Cancer Maternal Aunt        Breast, uterine  . Cancer Paternal Grandmother        Colon    Review of Systems:  Constitutional  Feels well,    ENT Normal appearing ears and nares bilaterally Skin/Breast  No rash, sores, jaundice, itching, dryness Cardiovascular   No chest pain, shortness of breath, or edema  Pulmonary  No cough or wheeze.  Gastro Intestinal  No nausea, vomitting, or diarrhoea. No bright red blood per rectum, + left abdominal pain, change in bowel movement, or constipation.  Genito Urinary  No frequency, urgency, dysuria, no bleeding Musculo Skeletal  + bilateral LE edema Neurologic  + foot neuropathy bilaterally  Psychology  No depression, anxiety, insomnia.   Vitals:  Blood pressure 129/88, pulse 70, temperature 97.6 F (36.4 C), temperature source Tympanic, resp. rate 20, height 5' 6" (1.676 m), weight 206 lb 3.2 oz (93.5 kg), SpO2  100 %.  Physical Exam: WD in NAD Neck  Supple NROM, without any enlargements.  Lymph Node Survey No cervical supraclavicular or inguinal adenopathy Cardiovascular  Pulse normal rate, regularity and rhythm. S1 and S2 normal.  Lungs  Clear to auscultation bilateraly, without wheezes/crackles/rhonchi. Good air movement.  Skin  No rash/lesions/breakdown  Psychiatry  Alert and oriented to person, place, and time  Abdomen  Normoactive bowel sounds, abdomen soft, and obese without evidence of hernia. Soft incisions. Tenderness deep in the left side between ASIS and rib cage. No associated mass palpable.  Back No CVA tenderness Genito Urinary  :vaginal cuff normal with no lesions. No masses. No levator spasm. There was some erythema of the introitus and cuff consistent with vulvovaginal candidasis. Thin atrophic vaginal tissues.  Rectal  deferred Extremities  No bilateral cyanosis, clubbing , mild edema lower extremities.  Thereasa Solo, MD  03/14/2020, 3:34 PM

## 2020-03-14 ENCOUNTER — Other Ambulatory Visit: Payer: Self-pay

## 2020-03-14 ENCOUNTER — Encounter: Payer: Self-pay | Admitting: Gynecologic Oncology

## 2020-03-14 ENCOUNTER — Inpatient Hospital Stay: Payer: 59 | Attending: Gynecologic Oncology | Admitting: Gynecologic Oncology

## 2020-03-14 VITALS — BP 129/88 | HR 70 | Temp 97.6°F | Resp 20 | Ht 66.0 in | Wt 206.2 lb

## 2020-03-14 DIAGNOSIS — B373 Candidiasis of vulva and vagina: Secondary | ICD-10-CM | POA: Diagnosis not present

## 2020-03-14 DIAGNOSIS — Z8542 Personal history of malignant neoplasm of other parts of uterus: Secondary | ICD-10-CM | POA: Insufficient documentation

## 2020-03-14 DIAGNOSIS — Z87891 Personal history of nicotine dependence: Secondary | ICD-10-CM | POA: Diagnosis not present

## 2020-03-14 DIAGNOSIS — Z90710 Acquired absence of both cervix and uterus: Secondary | ICD-10-CM | POA: Insufficient documentation

## 2020-03-14 DIAGNOSIS — Z90722 Acquired absence of ovaries, bilateral: Secondary | ICD-10-CM | POA: Insufficient documentation

## 2020-03-14 DIAGNOSIS — R600 Localized edema: Secondary | ICD-10-CM | POA: Insufficient documentation

## 2020-03-14 DIAGNOSIS — C541 Malignant neoplasm of endometrium: Secondary | ICD-10-CM

## 2020-03-14 DIAGNOSIS — Z08 Encounter for follow-up examination after completed treatment for malignant neoplasm: Secondary | ICD-10-CM | POA: Insufficient documentation

## 2020-03-14 DIAGNOSIS — N941 Unspecified dyspareunia: Secondary | ICD-10-CM | POA: Diagnosis not present

## 2020-03-14 DIAGNOSIS — R102 Pelvic and perineal pain: Secondary | ICD-10-CM | POA: Diagnosis not present

## 2020-03-14 DIAGNOSIS — Z803 Family history of malignant neoplasm of breast: Secondary | ICD-10-CM | POA: Diagnosis not present

## 2020-03-14 DIAGNOSIS — B3731 Acute candidiasis of vulva and vagina: Secondary | ICD-10-CM

## 2020-03-14 DIAGNOSIS — Z800 Family history of malignant neoplasm of digestive organs: Secondary | ICD-10-CM | POA: Diagnosis not present

## 2020-03-14 DIAGNOSIS — Z8049 Family history of malignant neoplasm of other genital organs: Secondary | ICD-10-CM | POA: Diagnosis not present

## 2020-03-14 DIAGNOSIS — Z9221 Personal history of antineoplastic chemotherapy: Secondary | ICD-10-CM | POA: Insufficient documentation

## 2020-03-14 MED ORDER — FLUCONAZOLE 100 MG PO TABS: 150.0000 mg | ORAL_TABLET | Freq: Once | ORAL | 1 refills | Status: AC

## 2020-03-14 NOTE — Patient Instructions (Signed)
Dr Andrey Farmer recommends following up with her in 6 months for evaluation.   Please notify Dr Andrey Farmer at phone number [phone] if you notice vaginal bleeding, new pelvic or abdominal pains, bloating, feeling full easy, or a change in bladder or bowel function.

## 2020-03-29 ENCOUNTER — Telehealth: Payer: Self-pay | Admitting: *Deleted

## 2020-03-29 MED FILL — ESTRADIOL 10 MCG TABS: 10 | 56 days supply | Qty: 16 | Fill #1

## 2020-03-29 NOTE — Telephone Encounter (Signed)
Patient called and stated "I have been having some changes in my bowels since last Saturday. I normally have a routine of 2-3 bowel movements a day; now it's 3-5 a day. I have changed my probiotic, so that may be it. But I notices a growth, about a double pea size coming from out of my rectum. I have no itching with it, just uncomfortable when I wipe. I don't know if this is normal. It doesn't look like a hemorrhoid." Explained that both Dr Denman George and Lenna Sciara APP are out of the office, but a message would be sent to her and someone will cal her back. Patient stated we can call her back on Monday.

## 2020-04-03 ENCOUNTER — Telehealth: Payer: Self-pay

## 2020-04-03 DIAGNOSIS — C541 Malignant neoplasm of endometrium: Secondary | ICD-10-CM

## 2020-04-03 DIAGNOSIS — R194 Change in bowel habit: Secondary | ICD-10-CM

## 2020-04-03 NOTE — Telephone Encounter (Signed)
-----   Message from Everitt Amber, MD sent at 04/02/2020  7:30 AM EST ----- Regarding: needs to see GI Dear Barbaraann Share and Sharyn Lull, This patient called last week I guess with a change in bowel habit. Due to her personal and family history she is at high risk for colon cancer and needs a GI evaluation/referral (if she doesn't have a gastroenterologist) to work up this new change in bowel habit. This does not sound like an issue related to her prior endometrial cancer.  Can you please call her to find out if she has a GI and if so, she should contact them for evaluation and colonoscopy. If not, can we make the referral to GI for consultation and colonoscopy. Thanks Terrence Dupont

## 2020-04-03 NOTE — Telephone Encounter (Signed)
Told Ms Rutt the recommendation from Dr. Denman George as noted below. Pt agreeable to GI referral.

## 2020-04-03 NOTE — Telephone Encounter (Signed)
Scheduled Danielle Guzman with Dr. Wilfrid Lund on February 17,2022 at 3:20 pm.  Arrive at 3 pm to check in. Gave this information to Danielle Lieber as well as phone number and address to [facility]. Told Danielle Lucy that her insurance is accepted by their practice. Pt verbalized understanding.

## 2020-05-02 ENCOUNTER — Ambulatory Visit: Payer: 59 | Admitting: Gastroenterology

## 2020-05-09 ENCOUNTER — Other Ambulatory Visit: Payer: Self-pay

## 2020-05-09 ENCOUNTER — Encounter: Payer: Self-pay | Admitting: Gastroenterology

## 2020-05-09 ENCOUNTER — Ambulatory Visit (INDEPENDENT_AMBULATORY_CARE_PROVIDER_SITE_OTHER): Payer: 59 | Admitting: Gastroenterology

## 2020-05-09 VITALS — BP 102/68 | HR 72 | Ht 66.0 in | Wt 219.5 lb

## 2020-05-09 DIAGNOSIS — Z1211 Encounter for screening for malignant neoplasm of colon: Secondary | ICD-10-CM

## 2020-05-09 DIAGNOSIS — K629 Disease of anus and rectum, unspecified: Secondary | ICD-10-CM | POA: Diagnosis not present

## 2020-05-09 NOTE — Progress Notes (Signed)
[facility] Consult Note:  History: Danielle Guzman 05/09/2020  Referring provider: Dr. Everitt Amber (gynecologic oncology)  Reason for consult/chief complaint: spot on rectum (Small white spot on rectum about the size of a pea.  Patient has no complaints of pain. Having normal bowel movements.)   Subjective  HPI:  Danielle Guzman is a very pleasant 55 year old woman referred by her gynecologic oncologist after recently contacting them when she noticed a perianal lesion. It has been there a couple of months, it is mostly irritating when she wipes after bowel movement. It otherwise does not bleed or cause pain or other difficulty. Her bowel habits are regular when she takes a variety of supplements and periodically uses a colon cleanse for overall health. She has had no prior colorectal cancer screening, though she recalls Dr. Denman George recommending and at some point. She had just felt well did not decide if it was necessary.   ROS:  Review of Systems  She denies chest pain dyspnea or dysuria  Past Medical History: Past Medical History:  Diagnosis Date  . Cancer ([place]) 2018   endometrial   . Compression fx, lumbar spine (HCC)    Compression Fracture T12  . Headache    hx migraines yrs ago  . Malaria as child  . Miscarriage    x 2  . Pneumonia yrs ago  . Seizures ([place])    petite mal seizure in high school x 1, none since  . Sinus infection 08/2017     Past Surgical History: Past Surgical History:  Procedure Laterality Date  . DENTAL SURGERY     gum graft 20 yrs ago and again on 08/26/16  . DILATATION & CURETTAGE/HYSTEROSCOPY WITH MYOSURE N/A 08/11/2016   Procedure: DILATATION & CURETTAGE/HYSTEROSCOPY WITH MYOSURE;  Surgeon: Jerelyn Charles, MD;  Location: [facility];  Service: Gynecology;  Laterality: N/A;  polyp removal  . IR FLUORO GUIDE PORT INSERTION RIGHT  11/10/2016  . IR REMOVAL TUN ACCESS W/ PORT W/O FL MOD SED  03/11/2017  . IR US GUIDE VASC ACCESS RIGHT   11/10/2016  . LYMPH NODE BIOPSY N/A 09/01/2016   Procedure: SENTINEL LYMPH NODE BIOPSY;  Surgeon: Everitt Amber, MD;  Location: [facility];  Service: Gynecology;  Laterality: N/A;  . ROBOTIC ASSISTED TOTAL HYSTERECTOMY WITH BILATERAL SALPINGO OOPHERECTOMY Bilateral 09/01/2016   Procedure: ROBOTIC ASSISTED TOTAL HYSTERECTOMY WITH BILATERAL SALPINGO OOPHORECTOMY;  Surgeon: Everitt Amber, MD;  Location: [facility];  Service: Gynecology;  Laterality: Bilateral;  . WISDOM TOOTH EXTRACTION     Endometrial cancer  Family History: Family History  Problem Relation Age of Onset  . Cancer Mother   . Breast cancer Mother   . Cancer Sister        Cervix  . Cancer Maternal Aunt        Breast, uterine  . Colon cancer Maternal Aunt   . Cancer Paternal Grandmother        Colon  . Colon cancer Paternal Grandmother   . Esophageal cancer Neg Hx   . Pancreatic cancer Neg Hx   . Stomach cancer Neg Hx     Social History: Social History   Socioeconomic History  . Marital status: Married    Spouse name: Elenore Rota "Danielle Guzman"  . Number of children: 0  . Years of education: Not on file  . Highest education level: Not on file  Occupational History  . Occupation: massage therapist  Tobacco Use  . Smoking status: Former Smoker    Packs/day: 1.50  Years: 8.00    Pack years: 12.00    Types: Cigarettes  . Smokeless tobacco: Never Used  . Tobacco comment: quit smoking at age 51  Vaping Use  . Vaping Use: Never used  Substance and Sexual Activity  . Alcohol use: Yes    Alcohol/week: 1.0 standard drink    Types: 1 Glasses of wine per week    Comment: each night  . Drug use: No  . Sexual activity: Yes    Birth control/protection: Post-menopausal  Other Topics Concern  . Not on file  Social History Narrative  . Not on file   Social Determinants of Health   Financial Resource Strain: Not on file  Food Insecurity: Not on file  Transportation Needs: Not on file  Physical Activity: Not on file  Stress: Not on  file  Social Connections: Not on file    Allergies: Allergies  Allergen Reactions  . Latex     Long exposure causes a rash  . Tamiflu  [Oseltamivir Phosphate] Nausea Only    Outpatient Meds: Current Outpatient Medications  Medication Sig Dispense Refill  . APPLE CIDER VINEGAR PO Take 1 Dose by mouth daily. Patient states she takes Gaffer 1tsp. Daily.    . Ascorbic Acid (VITAMIN C PO) Take 1 tablet by mouth 3 (three) times daily as needed (for immune health support or cold symptoms.).    Marland Kitchen b complex vitamins tablet Take 1 tablet by mouth daily.    . calcium citrate (CALCITRATE - DOSED IN MG ELEMENTAL CALCIUM) 950 MG tablet Take 200 mg of elemental calcium by mouth daily. Patient states she takes 6 pills a day.    . Estradiol 10 MCG TABS vaginal tablet Place 1 tablet vaginally 2 (two) times a week.    . furosemide (LASIX) 20 MG tablet Take 1 tablet (20 mg total) by mouth daily as needed. 30 tablet 3  . ibuprofen (ADVIL) 200 MG tablet Take 200-400 mg by mouth every 8 (eight) hours as needed.    . Omega-3 Fatty Acids (FISH OIL) 1000 MG CAPS Take 1 capsule by mouth daily.     Marland Kitchen OVER THE COUNTER MEDICATION Take 1 tablet by mouth daily. Tumeric once a day.    . Pediatric Multiple Vit-Vit C (VITAMIN DAILY PO) Take 10,000 Units by mouth daily.    . Probiotic Product (PROBIOTIC DAILY PO) Take 1 capsule by mouth daily.    . Vitamin D, Ergocalciferol, (DRISDOL) 50000 units CAPS capsule Take 1 capsule (50,000 Units total) by mouth every 7 (seven) days. 12 capsule 3   No current facility-administered medications for this visit.      ___________________________________________________________________ Objective   Exam:  BP 102/68 (BP Location: Left Arm, Patient Position: Sitting, Cuff Size: Large)   Pulse 72   Ht 5\' 6"  (1.676 m)   Wt 219 lb 8 oz (99.6 kg)   BMI 35.43 kg/m  Wt Readings from Last 3 Encounters:  05/09/20 219 lb 8 oz (99.6 kg)  03/14/20 206 lb 3.2 oz (93.5 kg)   08/25/19 220 lb 3.2 oz (99.9 kg)   Her husband Danielle Guzman was present for the entire visit  General: Well-appearing  Eyes: sclera anicteric, no redness  ENT: oral mucosa moist without lesions, no cervical or supraclavicular lymphadenopathy  CV: RRR without murmur, S1/S2, no JVD, no peripheral edema  Resp: clear to auscultation bilaterally, normal RR and effort noted  GI: soft, no tenderness, with active bowel sounds. No guarding or palpable organomegaly noted.  Skin;  warm and dry, no rash or jaundice noted  Neuro: awake, alert and oriented x 3. Normal gross motor function and fluent speech Perianal exam reveals a mildly redundant anterior perianal skin fold that she identified as the spot in question. It is not inflamed, there is no induration. There is no evidence of perianal Crohn's disease. DRE revealed no fissure tenderness or palpable internal lesions.   Assessment: Encounter Diagnoses  Name Primary?  . Perianal lesion Yes  . Special screening for malignant neoplasms, colon     Benign perianal finding of a redundant skin fold. If does not need any further testing or management, and she was reassured to know that. We did discuss colorectal cancer screening, and I told her about risk and benefits of colonoscopy and Cologuard. I recommended Cologuard is the preferable and more palpable screening test, but would order a Cologuard if that was her preference. Positive Cologuard typically means one or more precancerous polyps, and within me diagnostic colonoscopy is necessary. She was happy for the information, seems to be considering it and was encouraged to contact us if she wishes to proceed  Thank you for the courtesy of this consult.  Please call me with any questions or concerns.  Nelida Meuse III  CC: Referring provider noted above

## 2020-05-09 NOTE — Patient Instructions (Signed)
If you are age 56 or older, your body mass index should be between 23-30. Your Body mass index is 35.43 kg/m. If this is out of the aforementioned range listed, please consider follow up with your Primary Care Provider.  If you are age 62 or younger, your body mass index should be between 19-25. Your Body mass index is 35.43 kg/m. If this is out of the aformentioned range listed, please consider follow up with your Primary Care Provider.   It has been recommended to you by your physician that you have a Colonoscopy completed. Per your request, we did not schedule the procedure today. Please contact our office at [phone] should you decide to have the procedure completed. You will be scheduled for a pre-visit and procedure at that time.  It was a pleasure to see you today!  Dr. Loletha Carrow

## 2020-06-24 ENCOUNTER — Other Ambulatory Visit (HOSPITAL_COMMUNITY): Payer: Self-pay

## 2020-06-24 MED FILL — Estradiol Vaginal Tab 10 MCG: VAGINAL | 84 days supply | Qty: 24 | Fill #0 | Status: AC

## 2020-08-30 ENCOUNTER — Other Ambulatory Visit: Payer: Self-pay

## 2020-08-30 ENCOUNTER — Inpatient Hospital Stay: Payer: 59 | Attending: Gynecologic Oncology | Admitting: Gynecologic Oncology

## 2020-08-30 VITALS — BP 131/81 | HR 68 | Temp 98.1°F | Resp 16 | Ht 66.0 in | Wt 214.8 lb

## 2020-08-30 DIAGNOSIS — Z9221 Personal history of antineoplastic chemotherapy: Secondary | ICD-10-CM | POA: Diagnosis not present

## 2020-08-30 DIAGNOSIS — N941 Unspecified dyspareunia: Secondary | ICD-10-CM | POA: Insufficient documentation

## 2020-08-30 DIAGNOSIS — B3731 Acute candidiasis of vulva and vagina: Secondary | ICD-10-CM

## 2020-08-30 DIAGNOSIS — Z8542 Personal history of malignant neoplasm of other parts of uterus: Secondary | ICD-10-CM | POA: Diagnosis present

## 2020-08-30 DIAGNOSIS — Z90710 Acquired absence of both cervix and uterus: Secondary | ICD-10-CM | POA: Insufficient documentation

## 2020-08-30 DIAGNOSIS — C541 Malignant neoplasm of endometrium: Secondary | ICD-10-CM | POA: Diagnosis not present

## 2020-08-30 DIAGNOSIS — Z90722 Acquired absence of ovaries, bilateral: Secondary | ICD-10-CM | POA: Insufficient documentation

## 2020-08-30 MED ORDER — FLUCONAZOLE 100 MG PO TABS: 150.0000 mg | ORAL_TABLET | Freq: Once | ORAL | 2 refills | Status: AC

## 2020-08-30 NOTE — Progress Notes (Signed)
Follow-up Note: Gyn-Onc  Consult was requested by Dr. Loletta Specter and Dr Stann Mainland for the evaluation of Danielle Guzman 55 y.o. female  CC:  Chief Complaint  Patient presents with   Endometrial cancer [facility])   Assessment/Plan:  Danielle Guzman  is a 55 y.o.  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 MSI unstable. Presumed Lynch Syndrome.   She is s/p staging surgery on 09/01/16. And s/p adjuvat carboplatin and paclitaxel x 6 cycles between 11/12/2016 - 02/23/17.   No evidence of recurrence on exam.  Declined genetics consultation despite loss of MSH6 on IHC and strong family history of cancer  including a confirmed diagnosis of Lynch syndrome in her mother's twin sister.   Pelvic floor pain with levator spasm and dyspareunia -  Seeing Dr Illene Bolus and PT. Using vaginal estrogen tablets and pelvic PT.   We will refill her vaginal estrogen tablets, diflucan and lasix as needed.   Recommended colonoscopy screening. Counseled regarding increased risk for colon cancer given presumed (albeit undiagnosed) Lynch syndrome: MSI high endometrial cancer, maternal twin aunt with Lynch syndrome. She continues to decline this.   HPI: Danielle Guzman is a 55 year old [id number] who is seen in consultation at the request of Dr Stann Mainland and Dr Jerelyn Charles for endometrial cancer.  The patient reports early menopause at age 27 (all of her female relatives have had early menopause). She began experiencing postmenopausal bleeding in March 2018. She was seen by Dr Stann Mainland on 06/02/16 and a TVUS showed an endometrium of 1.8cm with a 3cm hypervascular polyp.  She was taken to the OR on 08/11/16 for a hysteroscopy/D&C with Dr Jerelyn Charles and the specimen showed endometrial adenocarcinoma with the majority of the specimen revealing grade 2 endometrioid endometrial cancer with a small focus (5%) of clear cell carcinoma and a few foci suspicious for serous carcinoma.  Her mother has  a history of breast cancer x 3, she has a sister with a history of cervical cancer. Her maternal aunt has a history of breast cancer. A different maternal aunt (her mother's twin sister) was diagnosed with Lynch Syndrome after having uterine and colon cancers. Her maternal cousin had uterine cancer. Her paternal grandmother had a diagnosis of colon cancer.  On 09/01/16 she underwent robotic assisted total hysterectomy, BSO, SLN biopsy. Surgery was uncomplicated. The final pathology revealed a 1.8cm grade 2 endometrioid endometrial cancer ( with rare foci of squamous differentiation and a rare microscopic focus of serous differentiation). There was luminal foci of carcinoma within the right and left fallopian tubes and the right fallopian tube lumen involvement showed a focus of similar appearing chondroid stroma with adjacent focus of serous carcinoma.  The lymph nodes and cervix were negative for carcinoma. The uterine tumor showed no myometrial invasion and no LVSI. Final pathology revealed loss of MSH6 on IHC.   She declined genetics consultation despite counseling that she may have Lynch syndrome which increases her risk for other cancers (that might be detected early or prevented with more frequent screenings that can be facilitated with this diagnosis).  Postoperatively she completed 6 cycles of carboplatin and paclitaxel between 11/12/17-12/11/9 after experiencing some trepidation regarding toxicity overwhelming benefits and this concern delayed initiation of adjuvant therapy.   She has a history of back and abdominal pain which prompted a CT abd/pelvis on February 28, 2018.  This showed mild superior endplate compression fracture deformity of T12 which was new from  2018, no retropulsion.  There was no evidence of recurrent or metastatic disease.  She had been seeing a chiropractor for this and feels that this is improved her symptoms substantially.  CT abd/pelvis in December, 2020 was ordered  due to pelvic pain symptoms. This was negative for evidence of recurrence.   Interval Hx:   She saw Dr. Illene Bolus for symptoms of dyspareunia and pelvic pain.  Dr. Hiram Comber recommended vaginal estrogen tablets and Botox injections.  She is planning to explore having these performed in [place] or alternatively at [facility] with Dr. Hiram Comber.  She is also reinitiating pelvic PT with Danielle Guzman however needs a new referral.  She has no symptoms concerning for recurrence.  Current Meds:  Outpatient Encounter Medications as of 08/30/2020  Medication Sig   fluconazole (DIFLUCAN) 100 MG tablet Take 1.5 tablets (150 mg total) by mouth once for 1 dose.   APPLE CIDER VINEGAR PO Take 1 Dose by mouth daily. Patient states she takes Gaffer 1tsp. Daily.   Ascorbic Acid (VITAMIN C PO) Take 1 tablet by mouth 3 (three) times daily as needed (for immune health support or cold symptoms.).   b complex vitamins tablet Take 1 tablet by mouth daily.   calcium citrate (CALCITRATE - DOSED IN MG ELEMENTAL CALCIUM) 950 MG tablet Take 200 mg of elemental calcium by mouth daily. Patient states she takes 6 pills a day.   Estradiol 10 MCG TABS vaginal tablet Place 1 tablet vaginally 2 (two) times a week.   Estradiol 10 MCG TABS vaginal tablet INSERT 1 TABLET VAGINALLY TWICE A WEEK   furosemide (LASIX) 20 MG tablet TAKE 1 TABLET BY MOUTH DAILY AS NEEDED   ibuprofen (ADVIL) 200 MG tablet Take 200-400 mg by mouth every 8 (eight) hours as needed.   Omega-3 Fatty Acids (FISH OIL) 1000 MG CAPS Take 1 capsule by mouth daily.    OVER THE COUNTER MEDICATION Take 1 tablet by mouth daily. Tumeric once a day.   Pediatric Multiple Vit-Vit C (VITAMIN DAILY PO) Take 10,000 Units by mouth daily.   Probiotic Product (PROBIOTIC DAILY PO) Take 1 capsule by mouth daily.   Vitamin D, Ergocalciferol, (DRISDOL) 50000 units CAPS capsule Take 1 capsule (50,000 Units total) by mouth every 7 (seven) days.   No facility-administered encounter  medications on file as of 08/30/2020.    Allergy:  Allergies  Allergen Reactions   Latex     Long exposure causes a rash   Tamiflu  [Oseltamivir Phosphate] Nausea Only    Social Hx:   Social History   Socioeconomic History   Marital status: Married    Spouse name: Danielle Rota "Donnie"   Number of children: 0   Years of education: Not on file   Highest education level: Not on file  Occupational History   Occupation: massage therapist  Tobacco Use   Smoking status: Former    Packs/day: 1.50    Years: 8.00    Pack years: 12.00    Types: Cigarettes   Smokeless tobacco: Never   Tobacco comments:    quit smoking at age 39  Vaping Use   Vaping Use: Never used  Substance and Sexual Activity   Alcohol use: Yes    Alcohol/week: 1.0 standard drink    Types: 1 Glasses of wine per week    Comment: each night   Drug use: No   Sexual activity: Yes    Birth control/protection: Post-menopausal  Other Topics Concern   Not on file  Social History Narrative   Not on file   Social Determinants of Health   Financial Resource Strain: Not on file  Food Insecurity: Not on file  Transportation Needs: Not on file  Physical Activity: Not on file  Stress: Not on file  Social Connections: Not on file  Intimate Partner Violence: Not on file    Past Surgical Hx:  Past Surgical History:  Procedure Laterality Date   DENTAL SURGERY     gum graft 20 yrs ago and again on 08/26/16   [place] N/A 08/11/2016   Procedure: [place];  Surgeon: Jerelyn Charles, MD;  Location: [facility];  Service: Gynecology;  Laterality: N/A;  polyp removal   IR FLUORO GUIDE PORT INSERTION RIGHT  11/10/2016   IR REMOVAL TUN ACCESS W/ PORT W/O FL MOD SED  03/11/2017   IR US GUIDE VASC ACCESS RIGHT  11/10/2016   LYMPH NODE BIOPSY N/A 09/01/2016   Procedure: SENTINEL LYMPH NODE BIOPSY;  Surgeon: Everitt Amber, MD;  Location: [facility];  Service:  Gynecology;  Laterality: N/A;   ROBOTIC ASSISTED TOTAL HYSTERECTOMY WITH BILATERAL SALPINGO OOPHERECTOMY Bilateral 09/01/2016   Procedure: ROBOTIC ASSISTED TOTAL HYSTERECTOMY WITH BILATERAL SALPINGO OOPHORECTOMY;  Surgeon: Everitt Amber, MD;  Location: [facility];  Service: Gynecology;  Laterality: Bilateral;   WISDOM TOOTH EXTRACTION      Past Medical Hx:  Past Medical History:  Diagnosis Date   Cancer ([place]) 2018   endometrial    Compression fx, lumbar spine (HCC)    Compression Fracture T12   Headache    hx migraines yrs ago   Malaria as child   Miscarriage    x 2   Pneumonia yrs ago   Seizures ([place])    petite mal seizure in high school x 1, none since   Sinus infection 08/2017    Past Gynecological History:  Premature menopause No LMP recorded. Patient has had a hysterectomy.  Family Hx:  Family History  Problem Relation Age of Onset   Cancer Mother    Breast cancer Mother    Cancer Sister        Cervix   Cancer Maternal Aunt        Breast, uterine   Colon cancer Maternal Aunt    Cancer Paternal Grandmother        Colon   Colon cancer Paternal Grandmother    Esophageal cancer Neg Hx    Pancreatic cancer Neg Hx    Stomach cancer Neg Hx     Review of Systems:  Constitutional  Feels well,    ENT Normal appearing ears and nares bilaterally Skin/Breast  No rash, sores, jaundice, itching, dryness Cardiovascular   No chest pain, shortness of breath, or edema  Pulmonary  No cough or wheeze.  Gastro Intestinal  No nausea, vomitting, or diarrhoea. No bright red blood per rectum, + left abdominal pain, change in bowel movement, or constipation.  Genito Urinary  No frequency, urgency, dysuria, no bleeding Musculo Skeletal  + bilateral LE edema Neurologic  + foot neuropathy bilaterally  Psychology  No depression, anxiety, insomnia.   Vitals:  Blood pressure 131/81, pulse 68, temperature 98.1 F (36.7 C), temperature source Tympanic, resp. rate 16, height '5\' 6"'   (1.676 m), weight 214 lb 12.8 oz (97.4 kg), SpO2 99 %.  Physical Exam: WD in NAD Neck  Supple NROM, without any enlargements.  Lymph Node Survey No cervical supraclavicular or inguinal adenopathy Cardiovascular  Pulse normal rate, regularity and  rhythm. S1 and S2 normal.  Lungs  Clear to auscultation bilateraly, without wheezes/crackles/rhonchi. Good air movement.  Skin  No rash/lesions/breakdown  Psychiatry  Alert and oriented to person, place, and time  Abdomen  Normoactive bowel sounds, abdomen soft, and obese without evidence of hernia. Soft incisions. Tenderness deep in the left side between ASIS and rib cage. No associated mass palpable.  Back No CVA tenderness Genito Urinary  :vaginal cuff normal with no lesions. No masses. No levator spasm. There was some erythema of the introitus and cuff consistent with vulvovaginal candidasis. Thin atrophic vaginal tissues.  Rectal  deferred Extremities  No bilateral cyanosis, clubbing , mild edema lower extremities.  Thereasa Solo, MD  08/30/2020, 2:38 PM

## 2020-08-30 NOTE — Patient Instructions (Signed)
We will refill your diflucan. We have re-referred you to [facility].  Please notify Dr Denman George at phone number [phone] if you notice vaginal bleeding, new pelvic or abdominal pains, bloating, feeling full easy, or a change in bladder or bowel function.   Dr Denman George is departing the [place] [facility] in October, 2022. Her partners and colleagues including Dr Berline Lopes, Dr Delsa Sale and Joylene John, Nurse Practitioner will be available to continue your care.   You are next scheduled to return to the Gynecologic Oncology office at the [facility] in December with [facility].

## 2021-02-21 ENCOUNTER — Other Ambulatory Visit (HOSPITAL_COMMUNITY): Payer: Self-pay

## 2021-02-21 ENCOUNTER — Encounter: Payer: Self-pay | Admitting: Gynecologic Oncology

## 2021-02-21 ENCOUNTER — Inpatient Hospital Stay: Payer: 59 | Attending: Gynecologic Oncology | Admitting: Gynecologic Oncology

## 2021-02-21 ENCOUNTER — Other Ambulatory Visit: Payer: Self-pay

## 2021-02-21 VITALS — BP 139/79 | HR 62 | Temp 98.1°F | Resp 16 | Ht 66.0 in | Wt 212.0 lb

## 2021-02-21 DIAGNOSIS — L988 Other specified disorders of the skin and subcutaneous tissue: Secondary | ICD-10-CM

## 2021-02-21 DIAGNOSIS — Z90710 Acquired absence of both cervix and uterus: Secondary | ICD-10-CM | POA: Diagnosis not present

## 2021-02-21 DIAGNOSIS — R600 Localized edema: Secondary | ICD-10-CM

## 2021-02-21 DIAGNOSIS — Z803 Family history of malignant neoplasm of breast: Secondary | ICD-10-CM | POA: Insufficient documentation

## 2021-02-21 DIAGNOSIS — N941 Unspecified dyspareunia: Secondary | ICD-10-CM | POA: Diagnosis not present

## 2021-02-21 DIAGNOSIS — Z8049 Family history of malignant neoplasm of other genital organs: Secondary | ICD-10-CM | POA: Insufficient documentation

## 2021-02-21 DIAGNOSIS — Z800 Family history of malignant neoplasm of digestive organs: Secondary | ICD-10-CM | POA: Diagnosis not present

## 2021-02-21 DIAGNOSIS — Z9221 Personal history of antineoplastic chemotherapy: Secondary | ICD-10-CM | POA: Insufficient documentation

## 2021-02-21 DIAGNOSIS — G629 Polyneuropathy, unspecified: Secondary | ICD-10-CM | POA: Diagnosis not present

## 2021-02-21 DIAGNOSIS — B3731 Acute candidiasis of vulva and vagina: Secondary | ICD-10-CM | POA: Diagnosis not present

## 2021-02-21 DIAGNOSIS — R102 Pelvic and perineal pain: Secondary | ICD-10-CM

## 2021-02-21 DIAGNOSIS — N898 Other specified noninflammatory disorders of vagina: Secondary | ICD-10-CM

## 2021-02-21 DIAGNOSIS — Z8542 Personal history of malignant neoplasm of other parts of uterus: Secondary | ICD-10-CM

## 2021-02-21 DIAGNOSIS — M25559 Pain in unspecified hip: Secondary | ICD-10-CM

## 2021-02-21 DIAGNOSIS — Z90722 Acquired absence of ovaries, bilateral: Secondary | ICD-10-CM | POA: Diagnosis not present

## 2021-02-21 DIAGNOSIS — C541 Malignant neoplasm of endometrium: Secondary | ICD-10-CM

## 2021-02-21 MED ORDER — FLUCONAZOLE 150 MG PO TABS
150.0000 mg | ORAL_TABLET | ORAL | 2 refills | Status: DC | PRN
  Filled 2021-02-21: qty 1, 1d supply, fill #0

## 2021-02-21 MED ORDER — FUROSEMIDE 20 MG PO TABS
ORAL_TABLET | Freq: Every day | ORAL | 3 refills | Status: DC | PRN
  Filled 2021-02-21: qty 30, 30d supply, fill #0

## 2021-02-21 MED ORDER — ESTRADIOL 0.1 MG/GM VA CREA
1.0000 | TOPICAL_CREAM | VAGINAL | 6 refills | Status: DC
Start: 2021-02-24 — End: 2022-03-02
  Filled 2021-02-21: qty 42.5, 30d supply, fill #0

## 2021-02-21 MED ORDER — NYSTATIN 100000 UNIT/GM EX CREA
1.0000 "application " | TOPICAL_CREAM | Freq: Two times a day (BID) | CUTANEOUS | 3 refills | Status: DC
  Filled 2021-02-21: qty 30, 15d supply, fill #0

## 2021-02-21 NOTE — Progress Notes (Signed)
Follow-up Note: Gyn Oncology  CC:  Chief Complaint  Patient presents with   Endometrial cancer [facility])   Assessment/Plan: Danielle Guzman  is a 55 y.o. 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 Everitt Amber. 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 She completed adjuvant carboplatin and paclitaxel x 6 cycles between 11/12/2016 - 02/23/17. Declined genetics consultation despite loss of MSH6 on IHC and strong family history of cancer  including a confirmed diagnosis of Lynch syndrome in her mother's twin sister.   Pelvic floor pain with levator spasm and dyspareunia -estrace vaginal cream prescribed along with a new referral to meet with pelvic floor PT. Advised patient I would look into the FemTouch procedure and send a message with pelvic floor therapist, Earlie Counts, for her opinion as well per patient request.   Refills have been sent in for diflucan prn, nystatin cream, lasix as needed along with a new prescription for estrace vaginal cream.   Recommended colonoscopy screening and mammograms. Advised to call the office if she decides to proceed with either so referrals can be placed. She would like to meet with the PA at Dr. Juel Burrow office (Dermatology) in [place] for further evaluation of the right breast skin lesion. Given her persistent pelvic pain, plan for CT scan to evaluation for signs of recurrence.   She will also be contacted with the results of the biopsy from today. Advised if CT scan and biopsy are negative, the recommendation would be for follow up in six months or sooner if any needs or symptoms arise.  HPI: Danielle Guzman is a 55 year old [id number] initially seen in consultation at the request of Dr Stann Mainland and Dr Jerelyn Charles for newly diagnosed endometrial cancer.  She underwent early menopause at age 57 with all of her female relatives experiencing early menopause as well.  She developed postmenopausal bleeding in March 2018 and sought care with Dr Stann Mainland on 05/2016. A TVUS showed an endometrium of 1.8 cm with a 3 cm hypervascular polyp. On 08/11/16, she underwent a hysteroscopy/D&C with Dr. Jerelyn Charles. Final pathology revealed endometrial adenocarcinoma with the majority of the specimen revealing grade 2 endometrioid endometrial cancer with a small focus (5%) of clear cell carcinoma and a few foci suspicious for serous carcinoma.  Her family history includes her mother with a history of breast cancer x 3 and a sister with a history of cervical cancer. Her maternal aunt has a history of breast cancer. A different maternal aunt (her mother's twin sister) was diagnosed with Lynch Syndrome after having uterine and colon cancers. Her maternal cousin had uterine cancer. Her paternal grandmother had a diagnosis of colon cancer.  On 09/01/16, she underwent robotic assisted total hysterectomy, BSO, SLN biopsy with Dr. Everitt Amber. Surgery was uncomplicated. The final pathology revealed a 1.8 cm grade 2 endometrioid endometrial cancer (with rare foci of squamous differentiation and a rare microscopic focus of serous differentiation). There was luminal foci of carcinoma within the right and left fallopian tubes and the right fallopian tube lumen involvement showed a focus of similar appearing chondroid stroma with adjacent focus of serous carcinoma.  The lymph nodes and cervix were negative for carcinoma and the uterine tumor showed no myometrial invasion and no LVSI. Final pathology revealed loss of MSH6 on IHC.   She declined genetics consultation despite counseling that she may have Lynch syndrome which increases her risk for other  cancers (that might be detected early or prevented with more frequent screenings that can be facilitated with this diagnosis).  Postoperatively, she completed 6 cycles of carboplatin and paclitaxel between 11/12/17-12/11/9 after experiencing some trepidation  regarding toxicity overwhelming benefits and this concern delayed initiation of adjuvant therapy.   She has a history of back and abdominal pain, which prompted a CT abd/pelvis on February 28, 2018.  This showed mild superior endplate compression fracture deformity of T12 which was new from 2018, no retropulsion.  There was no evidence of recurrent or metastatic disease. She was seen by a chiropractor for this and feels that this is improved her symptoms substantially.  CT abd/pelvis in December, 2020 was ordered due to pelvic pain symptoms. This was negative for evidence of recurrence.   Interval Hx: She presents today for continued follow up. She states she has been doing overall well since her last visit but has had some stressors in her life. A referral was placed for pelvic PT at her last visit in June but she states she did not attend due to 4 family deaths and life stressors. She is willing to go to pelvic floor PT at this time. She continues to have dyspareunia. She uses lubrication but states there is a point with penetration that is very painful and the pain continues after removal. She states pelvic floor therapy helped previous and she was able to have penetrative intercourse. She saw Dr. Illene Bolus at [facility] and states she recommends botox injections but she would rather try something more natural. She has been out of the vaginal estrace tablets and requests a refill. We discussed the tablets vs cream and she would like to try the cream.   She reports lower pelvic pain for the past three months, increasing since the end of October 2022. She feels it is close to the pubic bone and behind it. The pain can last 2-3 minutes or up to 30 minutes. There are no triggers she has noted. It can radiate to her rectum intermittently. It starts as a stabbing sensation then spreads into an ache. No vaginal bleeding reported. Bowels and bladder functioning without changes.  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 The neuropathy in her hands and fingers has improved, with originally having difficulty buttoning shirts. She has intermittent lower extremity edema that has not changed but has been present since surgery. She uses lasix intermittently for the edema. She continues to use nystatin powder intermittently for groin candidiasis and also uses diflucan once every 2-3 months if needed. Refills have been requested on both medications. She met with a specialist today about dyspareunia and to inquire about FemTouch treatments. She was told she would need a special ultrasound first to see if she would be a candidate and she would have to pay out of pocket. She would prefer to do this through a [facility] facility if possible. She is also planning on starting a 42 day detox with her naturalist. She does every other day breast exams and has not had mammograms. She has not noticed any changes in her breast such as masses, discharge etc. She does have a skin lesion that has been present at 3 o clock around the nipple on the right breast. She has not noticed significant changes in this but states it started to itch. She has not seen a dermatologist in the past but would be interested in being  seen at an office in [place].    Review of Systems: Constitutional: Feels well but stressed at times. Appetite is good. No change in weight, early satiety.  Cardiovascular: No chest pain, shortness of breath, or edema.  Pulmonary: No cough or wheeze.  Gastrointestinal: No nausea, vomiting, or diarrhea. No bright red blood per rectum or change in bowel movement.  Genitourinary: Voids frequently but relates this to drinking one gallon of water a day. No frequency, urgency, or dysuria. No vaginal bleeding or discharge.  Musculoskeletal: No new myalgia or joint pain. Back pain is doing better. Neurologic: No weakness, numbness, or change in gait.   Psychology: She has been stressed over the past several months.  Skin: Skin lesion on the right breast at the nipple, has been present but started itching  Health Maintenance: Mammogram: Declines to have Colonoscopy: Declines to have, saw GI in the past for anal lesion, states GI said to call prn Sees a naturalist  Current Meds:  Outpatient Encounter Medications as of 02/21/2021  Medication Sig   APPLE CIDER VINEGAR PO Take 1 Dose by mouth daily. Patient states she takes Gaffer 1tsp. Daily.   Ascorbic Acid (VITAMIN C PO) Take 1 tablet by mouth 3 (three) times daily as needed (for immune health support or cold symptoms.).   b complex vitamins tablet Take 1 tablet by mouth daily.   calcium citrate (CALCITRATE - DOSED IN MG ELEMENTAL CALCIUM) 950 MG tablet Take 200 mg of elemental calcium by mouth daily. Patient states she takes 6 pills a day.   [START ON 02/24/2021] estradiol (ESTRACE VAGINAL) 0.1 MG/GM vaginal cream Insert 1 applicatorful vaginally 2 times a week.   fluconazole (DIFLUCAN) 150 MG tablet Take 1 tablet (150 mg total) by mouth for vulvar yeast.  May repeat if needed   ibuprofen (ADVIL) 200 MG tablet Take 200-400 mg by mouth every 8 (eight) hours as needed.   nystatin cream (MYCOSTATIN) Apply to groin 2 times daily as needed for yeast or itching   Omega-3 Fatty Acids (FISH OIL) 1000 MG CAPS Take 1 capsule by mouth daily.    OVER THE COUNTER MEDICATION Take 1 tablet by mouth daily. Tumeric once a day.   Vitamin D, Ergocalciferol, (DRISDOL) 50000 units CAPS capsule Take 1 capsule (50,000 Units total) by mouth every 7 (seven) days.   [DISCONTINUED] Estradiol 10 MCG TABS vaginal tablet Place 1 tablet vaginally 2 (two) times a week.   [DISCONTINUED] fluconazole (DIFLUCAN) 150 MG tablet Take 150 mg by mouth once.   furosemide (LASIX) 20 MG tablet TAKE 1 TABLET BY MOUTH DAILY AS NEEDED   Pediatric Multiple Vit-Vit C (VITAMIN DAILY PO) Take 10,000 Units by mouth daily.  (Patient not taking: Reported on 02/21/2021)   Probiotic Product (PROBIOTIC DAILY PO) Take 1 capsule by mouth daily. (Patient not taking: Reported on 02/21/2021)   [DISCONTINUED] furosemide (LASIX) 20 MG tablet TAKE 1 TABLET BY MOUTH DAILY AS NEEDED   No facility-administered encounter medications on file as of 02/21/2021.    Allergy:  Allergies  Allergen Reactions   Latex     Long exposure causes a rash   Tamiflu  [Oseltamivir Phosphate] Nausea Only    Social Hx:   Social History   Socioeconomic History   Marital status: Married    Spouse name: [name]   Number of children: 0   Years of education: Not on file   Highest education level: Not on file  Occupational History   Occupation: massage therapist  Tobacco Use   Smoking status: Former    Packs/day: 1.50    Years: 8.00    Pack years: 12.00    Types: Cigarettes   Smokeless tobacco: Never   Tobacco comments:    quit smoking at age 10  Vaping Use   Vaping Use: Never used  Substance and Sexual Activity   Alcohol use: Yes    Alcohol/week: 1.0 standard drink    Types: 1 Glasses of wine per week    Comment: each night   Drug use: No   Sexual activity: Yes    Birth control/protection: Post-menopausal  Other Topics Concern   Not on file  Social History Narrative   Not on file   Social Determinants of Health   Financial Resource Strain: Not on file  Food Insecurity: Not on file  Transportation Needs: Not on file  Physical Activity: Not on file  Stress: Not on file  Social Connections: Not on file  Intimate Partner Violence: Not on file    Past Surgical Hx:  Past Surgical History:  Procedure Laterality Date   DENTAL SURGERY     gum graft 20 yrs ago and again on 08/26/16   [place] N/A 08/11/2016   Procedure: [place];  Surgeon: Jerelyn Charles, MD;  Location: [facility];  Service: Gynecology;  Laterality: N/A;  polyp removal   IR  FLUORO GUIDE PORT INSERTION RIGHT  11/10/2016   IR REMOVAL TUN ACCESS W/ PORT W/O FL MOD SED  03/11/2017   IR US GUIDE VASC ACCESS RIGHT  11/10/2016   LYMPH NODE BIOPSY N/A 09/01/2016   Procedure: SENTINEL LYMPH NODE BIOPSY;  Surgeon: Everitt Amber, MD;  Location: [facility];  Service: Gynecology;  Laterality: N/A;   ROBOTIC ASSISTED TOTAL HYSTERECTOMY WITH BILATERAL SALPINGO OOPHERECTOMY Bilateral 09/01/2016   Procedure: ROBOTIC ASSISTED TOTAL HYSTERECTOMY WITH BILATERAL SALPINGO OOPHORECTOMY;  Surgeon: Everitt Amber, MD;  Location: [facility];  Service: Gynecology;  Laterality: Bilateral;   WISDOM TOOTH EXTRACTION      Past Medical Hx:  Past Medical History:  Diagnosis Date   Cancer ([place]) 2018   endometrial    Compression fx, lumbar spine ([facility])    Compression Fracture T12   Headache    hx migraines yrs ago   Malaria as child   Miscarriage    x 2   Pneumonia yrs ago   Seizures ([place])    petite mal seizure in high school x 1, none since   Sinus infection 08/2017    Past Gynecological History:  Premature menopause No LMP recorded. Patient has had a hysterectomy.  Family Hx:  Family History  Problem Relation Age of Onset   Cancer Mother    Breast cancer Mother    Cancer Sister        Cervix   Cancer Maternal Aunt        Breast, uterine   Colon cancer Maternal Aunt    Cancer Paternal Grandmother        Colon   Colon cancer Paternal Grandmother    Esophageal cancer Neg Hx    Pancreatic cancer Neg Hx    Stomach cancer Neg Hx     Vitals:  Blood pressure 139/79, pulse 62, temperature 98.1 F (36.7 C), temperature source Oral, resp. rate 16, height 5' 6" (1.676 m), weight 212 lb (96.2 kg), SpO2 100 %.  Physical Exam: General: Well developed, well nourished female in no acute distress. Alert and oriented x 3.  Neck: Supple  without any enlargements.  Lymph node survey: No cervical, supraclavicular, or inguinal adenopathy.  Cardiovascular: Regular rate and rhythm. S1 and S2 normal.   Lungs: Clear to auscultation bilaterally. No wheezes/crackles/rhonchi noted.  Skin: Skin lesion noted on the right breast at 3 o clock at the nipple. Similar in appearance to a seborrheic keratosis. No other rashes or lesions present. Back: No CVA tenderness.  Abdomen: Abdomen soft, non-tender and obese. Active bowel sounds in all quadrants. No evidence of a fluid wave or abdominal masses. No nodularity noted at healed laparoscopic sites.  Genitourinary:    Vulva/vagina: Normal external female genitalia. No lesions.    Urethra: No lesions or masses.    Vagina: Vagina atrophic. Mildly erythematous lesion noted at the vaginal cuff. No palpable masses. No vaginal bleeding or drainage noted.  Rectal: Good tone, no masses, no cul de sac nodularity. Stool noted in the rectum that was felt on vaginal exam.  Extremities: Bilateral lower extremity edema, non pitting. No bilateral cyanosis or clubbing.    Vaginal cuff biopsy: The procedure was discussed with the patient including risk and benefits. After consent from the patient, the area at the vaginal cuff was cleansed with betadine. With a tischler forcep, a biopsy was taken of the erythematous lesion. Patient tolerated well. Hemostasis achieved with silver nitrate. Everything removed from the vagina.   Dorothyann Gibbs, NP  02/21/2021, 4:45 PM

## 2021-02-21 NOTE — Patient Instructions (Signed)
It was great seeing you today. I will contact you with the results of your biopsy from today. You may have some spotting or grayish discharge from the medication used today. Plan to have a CT scan of the abdomen and pelvis to further evaluate the pelvic pain you have been having for the past 3 months.   I have refilled your nystatin cream, lasix, estrace vaginal cream to the [facility]. I have also placed another referral for pelvic floor PT so you should be receiving a call from their office as well. If your biopsy and CT scan are negative, plan to follow up in six months or sooner if any needs or symptoms arise.  We will also place a referral for you to see a dermatologist for the area on your right breast.  I hope the detox goes well and look forward to hearing how you feel after. Please let me know if you need anything.   If you become interested in having a mammogram or colonoscopy given your history, please let me know. If not, continue with your examinations at home.   Symptoms to report to your health care team include vaginal bleeding, rectal bleeding, bloating, weight loss without effort, new and persistent pain, new and  persistent fatigue, new leg swelling, new masses (i.e., bumps in your neck or groin), new and persistent cough, new and persistent nausea and vomiting, change in bowel or bladder habits, and any other concerns.

## 2021-02-22 ENCOUNTER — Other Ambulatory Visit (HOSPITAL_COMMUNITY): Payer: Self-pay

## 2021-02-24 ENCOUNTER — Telehealth: Payer: Self-pay

## 2021-02-24 LAB — SURGICAL PATHOLOGY

## 2021-02-24 NOTE — Telephone Encounter (Signed)
Dermatology referral successfully faxed to [facility] in [place], [place].

## 2021-02-24 NOTE — Telephone Encounter (Signed)
Following up with Danielle Guzman, patient does not have cell service as [place]. Patient to call our office back at [phone].

## 2021-02-25 NOTE — Telephone Encounter (Signed)
Left message requesting return call. Re:  biopsy results and referral information.

## 2021-02-25 NOTE — Telephone Encounter (Signed)
Spoke with Ms. Mullally this afternoon. Reviewed Vaginal biopsy results. Per Joylene John, NP biopsy showing inflammation, no sign of cancer. Hopefully the estrace vaginal cream will help with atrophy symptoms. Patient happy with this news.  Advised patient that referrals for PT and Dermatology have been placed. Only seeing one place for FemTouch which is [facility] in [place]. Patient states she has contacted them and they will be faxing a clearance form to gyn oncology. Provided patient with fax [phone] to have forms sent too.   Dermatology appointment scheduled with [facility] 03/11/2021 at 10:30.  Patient verbalized understanding of above information. Instructed patient to call with any questions or concerns.

## 2021-02-28 ENCOUNTER — Telehealth: Payer: Self-pay | Admitting: Gynecologic Oncology

## 2021-02-28 NOTE — Telephone Encounter (Signed)
Called patient to follow up about FemTouch inquiry discussed at her recent visit. I had reached out to Earlie Counts, pelvic floor physical therapist, for her input on the procedure as well. Discussed information with the patient. She states she will prob wait on proceeding with this and will go to pelvic floor PT with Malachy Mood first. Advised to call for any needs and advised she would be contacted with the results of her CT on Monday.

## 2021-03-03 ENCOUNTER — Telehealth: Payer: Self-pay

## 2021-03-03 ENCOUNTER — Encounter (HOSPITAL_COMMUNITY): Payer: Self-pay

## 2021-03-03 ENCOUNTER — Other Ambulatory Visit: Payer: Self-pay

## 2021-03-03 ENCOUNTER — Ambulatory Visit (HOSPITAL_COMMUNITY)
Admission: RE | Admit: 2021-03-03 | Discharge: 2021-03-03 | Disposition: A | Discharge status: Home / Self Care | Payer: 59 | Source: Ambulatory Visit | Attending: Gynecologic Oncology | Admitting: Gynecologic Oncology

## 2021-03-03 DIAGNOSIS — M25559 Pain in unspecified hip: Secondary | ICD-10-CM | POA: Diagnosis present

## 2021-03-03 DIAGNOSIS — C541 Malignant neoplasm of endometrium: Secondary | ICD-10-CM | POA: Diagnosis present

## 2021-03-03 MED ORDER — SODIUM CHLORIDE (PF) 0.9 % IJ SOLN
INTRAMUSCULAR | Status: AC
  Filled 2021-03-03: qty 50

## 2021-03-03 MED ORDER — IOHEXOL 350 MG/ML SOLN
80.0000 mL | Freq: Once | INTRAVENOUS | Status: AC | PRN
  Administered 2021-03-03: 08:00:00 80 mL via INTRAVENOUS

## 2021-03-03 NOTE — Telephone Encounter (Signed)
Spoke with Danielle Guzman this afternoon and reviewed CT results. Per Joylene John, NP there were no findings for cancer and no findings to explain pain. Some plaque was noted in the aorta and pulmonary nodules last seen are stable and benign. Patient verbalized understanding and appreciative of call. Instructed to call with any needs.

## 2021-03-03 NOTE — Telephone Encounter (Signed)
Attempted to reach patient to review CT results from 03/03/21. Unable to contact patient, left voicemail requesting a return call.

## 2021-03-05 ENCOUNTER — Telehealth: Payer: Self-pay | Admitting: *Deleted

## 2021-03-05 NOTE — Telephone Encounter (Signed)
Called and left the patient a message to call the office back. Patient needs to call the pelvic floor PT office at [phone] for an appt

## 2021-03-13 ENCOUNTER — Ambulatory Visit: Payer: 59 | Admitting: Gynecologic Oncology

## 2021-03-17 ENCOUNTER — Other Ambulatory Visit (HOSPITAL_COMMUNITY): Payer: Self-pay

## 2021-03-31 ENCOUNTER — Other Ambulatory Visit: Payer: Self-pay

## 2021-03-31 ENCOUNTER — Encounter: Payer: Self-pay | Admitting: Physical Therapy

## 2021-03-31 ENCOUNTER — Ambulatory Visit: Payer: 59 | Attending: Gynecologic Oncology | Admitting: Physical Therapy

## 2021-03-31 DIAGNOSIS — M6281 Muscle weakness (generalized): Secondary | ICD-10-CM | POA: Diagnosis not present

## 2021-03-31 DIAGNOSIS — M62838 Other muscle spasm: Secondary | ICD-10-CM | POA: Diagnosis not present

## 2021-03-31 DIAGNOSIS — N941 Unspecified dyspareunia: Secondary | ICD-10-CM | POA: Diagnosis not present

## 2021-03-31 DIAGNOSIS — C541 Malignant neoplasm of endometrium: Secondary | ICD-10-CM | POA: Diagnosis not present

## 2021-03-31 DIAGNOSIS — R278 Other lack of coordination: Secondary | ICD-10-CM | POA: Insufficient documentation

## 2021-03-31 NOTE — Therapy (Signed)
[facility] [facility] @ [place], [place], [address] Phone: [phone]   Fax:  [phone]  Physical Therapy Evaluation  Patient Details  Name: Danielle Guzman MRN: [id number] Date of Birth: [birth year] Referring Provider (PT): Joylene John, NP   Encounter Date: 03/31/2021   PT End of Session - 03/31/21 1446     Visit Number 1    Date for PT Re-Evaluation 06/23/21    Authorization Type [employer]et[employer]    PT Start Time 1400    PT Stop Time 1941    PT Time Calculation (min) 45 min    Activity Tolerance Patient tolerated treatment well    Behavior During Therapy [facility] for tasks assessed/performed             Past Medical History:  Diagnosis Date   Cancer ([place]) 2018   endometrial    Compression fx, lumbar spine ([place])    Compression Fracture T12   Headache    hx migraines yrs ago   Malaria as child   Miscarriage    x 2   Pneumonia yrs ago   Seizures ([place])    petite mal seizure in high school x 1, none since   Sinus infection 08/2017    Past Surgical History:  Procedure Laterality Date   DENTAL SURGERY     gum graft 20 yrs ago and again on 08/26/16   [place] N/A 08/11/2016   Procedure: [place];  Surgeon: Jerelyn Charles, MD;  Location: [facility];  Service: Gynecology;  Laterality: N/A;  polyp removal   IR FLUORO GUIDE PORT INSERTION RIGHT  11/10/2016   IR REMOVAL TUN ACCESS W/ PORT W/O FL MOD SED  03/11/2017   IR US GUIDE VASC ACCESS RIGHT  11/10/2016   LYMPH NODE BIOPSY N/A 09/01/2016   Procedure: SENTINEL LYMPH NODE BIOPSY;  Surgeon: Everitt Amber, MD;  Location: [facility];  Service: Gynecology;  Laterality: N/A;   ROBOTIC ASSISTED TOTAL HYSTERECTOMY WITH BILATERAL SALPINGO OOPHERECTOMY Bilateral 09/01/2016   Procedure: ROBOTIC ASSISTED TOTAL HYSTERECTOMY WITH BILATERAL SALPINGO OOPHORECTOMY;  Surgeon: Everitt Amber, MD;  Location: [facility];  Service:  Gynecology;  Laterality: Bilateral;   WISDOM TOOTH EXTRACTION      There were no vitals filed for this visit.    Subjective Assessment - 03/31/21 1410     Subjective Sex became more painful in 2021 and saw Dr. Hiram Comber. The incontinence has come back since I stopped the exercises. Go through a mini pad per day. I have not had sex with my husband for one year du eto pain. There is a certian area that feels like it is tearing. I continue to use the dilator and feel with the larger end of the dilator.    Patient Stated Goals stop incontinence and sex painfree    Currently in Pain? Yes    Pain Score 10-Worst pain ever    Pain Location Vagina    Pain Orientation Mid;Right;Posterior    Pain Descriptors / Indicators Other (Comment)   tearing   Pain Type Chronic pain    Pain Onset More than a month ago    Pain Frequency Intermittent    Aggravating Factors  happens when uses the larger dilator and head of penis    Pain Relieving Factors using the smaller dilator    Multiple Pain Sites No                [facility] PT Assessment -  03/31/21 0001       Assessment   Medical Diagnosis C54.1 Endometrial Cancer; N94.10 Dyspareunia, female    Referring Provider (PT) Joylene John, NP    Onset Date/Surgical Date --   2021   Prior Therapy yes      Precautions   Precautions Other (comment)    Precaution Comments cancer      Restrictions   Weight Bearing Restrictions No      Balance Screen   Has the patient fallen in the past 6 months No    Has the patient had a decrease in activity level because of a fear of falling?  No    Is the patient reluctant to leave their home because of a fear of falling?  No      Home Ecologist residence      Prior Function   Level of Independence Independent    Vocation Full time employment    Vocation Requirements standing, massage therapist    Leisure exercise      Cognition   Overall Cognitive Status Within Functional  Limits for tasks assessed      Posture/Postural Control   Posture/Postural Control No significant limitations      ROM / Strength   AROM / PROM / Strength AROM;PROM;Strength      AROM   Lumbar Flexion full but will feel like she is going to leak when at 90 degrees of hip flexion      Strength   Overall Strength Comments bilateal hip strength is 4/5                        Objective measurements completed on examination: See above findings.     Pelvic Floor Special Questions - 03/31/21 0001     Currently Sexually Active Yes    Is this Painful Yes    Marinoff Scale pain prevents any attempts at intercourse    Urinary Leakage Yes    Pad use 1 mini pad per day    Activities that cause leaking With strong urge;Other;Coughing;Sneezing;Laughing;Lifting;Bending    Other activities that cause leaking sit to stand or stand to sit, walking to the commode with a strong urge, going out to the side    Urinary urgency Yes    Fecal incontinence No    Falling out feeling (prolapse) No    Skin Integrity Intact;[place]    Prolapse None    Pelvic Floor Internal Exam Patient confirms identification and approves PT to assess pelvic floor and treatment    Exam Type Vaginal    Palpation tenderness located in the levator ani, obturator internist, and at the vault of the vaginal, right side worse than left    Strength weak squeeze, no lift   left anterior is 1/5                        PT Short Term Goals - 03/31/21 1612       PT SHORT TERM GOAL #1   Title independent with initial HEP for manual work to the pelvic floor    Baseline ---    Time 4    Period Weeks    Status New    Target Date 04/28/21      PT SHORT TERM GOAL #2   Title pelvic floor tenderness while doing manual work to the right posterior wall of vaginal canal decreased to </= 5/10    Baseline ---  Time 4    Period Weeks    Status New    Target Date 04/28/21      PT SHORT TERM GOAL #3    Title able to perfrom diaphragmatic breathing correctly to relax the pelvic floor    Baseline ---    Time 4    Period Weeks    Status New    Target Date 04/28/21      PT SHORT TERM GOAL #4   Title -----      PT SHORT TERM GOAL #5   Title ----    Baseline ---               PT Long Term Goals - 03/31/21 1614       PT LONG TERM GOAL #1   Title Pt will be independent in a home exercise program for continued strengthening and stretching    Baseline --    Time 12    Period Weeks    Status New    Target Date 06/23/21      PT LONG TERM GOAL #2   Title able to have penile penetration vaginally with pain level on the right posterior area of the vaginal canal with pain level 0-1/10 due to elongation of the tissue    Baseline ---    Time 12    Period Weeks    Status New    Target Date 06/23/21      PT LONG TERM GOAL #3   Title Pelvic floor strength >/= 3/5 reducing urinary leakage >/= 70% during the day and during activities    Baseline ---      PT LONG TERM GOAL #4   Title able to bend forward with >/= 90 degree hip flexion without urinary leakage    Baseline --    Time 12    Period Weeks    Status New    Target Date 06/23/21      PT LONG TERM GOAL #5   Title -----    Baseline -----                    Plan - 03/31/21 1559     Clinical Impression Statement Patient is a 56 year old female with dyspareunia for the past year. The pain level is 10/10 making her unable to have penile penetration vaginally and not have intercourse with her husband. Marinoff is 3/3. Patient has the pain when the penile head gets to a certain depth and the pain is on the right posterior of the vaginal canal. Once the penile head goes past this point she has less pain. Tenderness located in bilateral levator ani, obturator internist, and top of the vaginal vault with right side worse than left. Patient has to use 1 mini pad per day due to urinary leakage. She will leak with sit to  stand and back, strong urge, coughing, sneezing, laughing, bending, walking to the commode, moving leg to the side, and lifting. Patient will leak when she flexed her trunk forward and she gets her hips to 90 degrees of hip flexion. Pelvic floor strength is 2/5 and right anterior is 1/5. Patient would benefit from skilled therapy to improve pelvic floor lengthing to reduce pelvic pain and improve pelvic strength to reduce leakage.    Personal Factors and Comorbidities Comorbidity 3+;Fitness;Sex;Time since onset of injury/illness/exacerbation    Comorbidities endometrial cancer 2018; total hysterectomy with bilateral salpingo oophreectomy 09/01/2016; compression fracture T12    Examination-Activity Limitations Continence;Lift;Locomotion  Level;Other   laugh, cough, sneeze   Stability/Clinical Decision Making Evolving/Moderate complexity    Clinical Decision Making Low    Rehab Potential Excellent    PT Frequency 1x / week    PT Duration 12 weeks    PT Treatment/Interventions ADLs/Self Care Home Management;Biofeedback;Therapeutic activities;Therapeutic exercise;Neuromuscular re-education;Patient/family education;Manual techniques;Dry needling    PT Next Visit Plan manual work to the pelvic floor especially the distal levator ani; work on the left anterior pelvic floor contraction;    Consulted and Agree with Plan of Care Patient             Patient will benefit from skilled therapeutic intervention in order to improve the following deficits and impairments:  Decreased coordination, Increased fascial restricitons, Increased muscle spasms, Decreased activity tolerance, Pain, Decreased mobility, Decreased strength  Visit Diagnosis: Muscle weakness (generalized) - Plan: PT plan of care cert/re-cert  Other muscle spasm - Plan: PT plan of care cert/re-cert  Other lack of coordination - Plan: PT plan of care cert/re-cert     Problem List Patient Active Problem List   Diagnosis Date Noted    Dyspareunia in female 08/25/2019   Vagina, candidiasis 08/26/2018   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 08/26/2018   Vitamin D deficiency 12/25/2017   Physical deconditioning 03/25/2017   Obesity (BMI 30.0-34.9) 03/25/2017   Drug-induced hyperglycemia 12/23/2016   Port catheter in place 12/02/2016   Dysuria 12/02/2016   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

## 2021-04-04 ENCOUNTER — Encounter: Payer: Self-pay | Admitting: Gynecologic Oncology

## 2021-04-07 ENCOUNTER — Other Ambulatory Visit: Payer: Self-pay

## 2021-04-07 ENCOUNTER — Encounter: Payer: Self-pay | Admitting: Physical Therapy

## 2021-04-07 ENCOUNTER — Ambulatory Visit: Payer: 59 | Admitting: Physical Therapy

## 2021-04-07 DIAGNOSIS — M62838 Other muscle spasm: Secondary | ICD-10-CM

## 2021-04-07 DIAGNOSIS — R278 Other lack of coordination: Secondary | ICD-10-CM | POA: Diagnosis not present

## 2021-04-07 DIAGNOSIS — M6281 Muscle weakness (generalized): Secondary | ICD-10-CM

## 2021-04-07 DIAGNOSIS — N941 Unspecified dyspareunia: Secondary | ICD-10-CM | POA: Diagnosis not present

## 2021-04-07 DIAGNOSIS — C541 Malignant neoplasm of endometrium: Secondary | ICD-10-CM | POA: Diagnosis not present

## 2021-04-07 NOTE — Therapy (Signed)
[facility] @ [place], [place], [address] Phone: [phone]   Fax:  [phone]  Physical Therapy Treatment  Patient Details  Name: Danielle Guzman MRN: [id number] Date of Birth: 07-06-1965 Referring Provider (PT): Joylene John, NP   Encounter Date: 04/07/2021   PT End of Session - 04/07/21 0849     Visit Number 2    Date for PT Re-Evaluation 06/23/21    Authorization Type Aet[employer]    PT Start Time 0845    PT Stop Time 0925    PT Time Calculation (min) 40 min    Activity Tolerance Patient tolerated treatment well    Behavior During Therapy [facility] for tasks assessed/performed             Past Medical History:  Diagnosis Date   Cancer ([place]) 2018   endometrial    Compression fx, lumbar spine ([place])    Compression Fracture T12   Headache    hx migraines yrs ago   Malaria as child   Miscarriage    x 2   Pneumonia yrs ago   Seizures ([place])    petite mal seizure in high school x 1, none since   Sinus infection 08/2017    Past Surgical History:  Procedure Laterality Date   DENTAL SURGERY     gum graft 20 yrs ago and again on 08/26/16   [place] N/A 08/11/2016   Procedure: [place];  Surgeon: Jerelyn Charles, MD;  Location: [facility];  Service: Gynecology;  Laterality: N/A;  polyp removal   IR FLUORO GUIDE PORT INSERTION RIGHT  11/10/2016   IR REMOVAL TUN ACCESS W/ PORT W/O FL MOD SED  03/11/2017   IR US GUIDE VASC ACCESS RIGHT  11/10/2016   LYMPH NODE BIOPSY N/A 09/01/2016   Procedure: SENTINEL LYMPH NODE BIOPSY;  Surgeon: Everitt Amber, MD;  Location: [facility];  Service: Gynecology;  Laterality: N/A;   ROBOTIC ASSISTED TOTAL HYSTERECTOMY WITH BILATERAL SALPINGO OOPHERECTOMY Bilateral 09/01/2016   Procedure: ROBOTIC ASSISTED TOTAL HYSTERECTOMY WITH BILATERAL SALPINGO OOPHORECTOMY;  Surgeon: Everitt Amber, MD;  Location: [facility];  Service:  Gynecology;  Laterality: Bilateral;   WISDOM TOOTH EXTRACTION      There were no vitals filed for this visit.   Subjective Assessment - 04/07/21 0846     Subjective I felt good after the evaluation.    Patient Stated Goals stop incontinence and sex painfree    Currently in Pain? Yes    Pain Score 10-Worst pain ever    Pain Location Vagina    Pain Orientation Right;Posterior;Mid    Pain Descriptors / Indicators Other (Comment)   tearing   Pain Type Chronic pain    Pain Onset More than a month ago    Pain Frequency Intermittent    Aggravating Factors  happens when uses the larger dilator and head of penis    Pain Relieving Factors using the smaller dilator    Multiple Pain Sites No                            Pelvic Floor Special Questions - 04/07/21 0001     Pelvic Floor Internal Exam Patient confirms identification and approves PT to assess pelvic floor and treatment    Exam Type Vaginal               [facility] Adult PT Treatment/Exercise - 04/07/21 0001  Self-Care   Self-Care Other Self-Care Comments    Other Self-Care Comments  went over moving the clitoral hood off the clitorus and used a mirror; discussed how to progress to the larger dilator      Manual Therapy   Manual Therapy Internal Pelvic Floor    Internal Pelvic Floor manual work to the levator ani, obturator internist, coccygeus, piriformis with moving the hips and working throught trigger points and stroking the muscles while monitor for pain                     PT Education - 04/07/21 0925     Education Details educated patient on how to move the clitoral hood off the clitorus and went over on how to progress the dilator    Person(s) Educated Patient    Methods Explanation;Demonstration    Comprehension Verbalized understanding;Returned demonstration              PT Short Term Goals - 03/31/21 1612       PT SHORT TERM GOAL #1   Title independent with initial  HEP for manual work to the pelvic floor    Baseline ---    Time 4    Period Weeks    Status New    Target Date 04/28/21      PT SHORT TERM GOAL #2   Title pelvic floor tenderness while doing manual work to the right posterior wall of vaginal canal decreased to </= 5/10    Baseline ---    Time 4    Period Weeks    Status New    Target Date 04/28/21      PT SHORT TERM GOAL #3   Title able to perfrom diaphragmatic breathing correctly to relax the pelvic floor    Baseline ---    Time 4    Period Weeks    Status New    Target Date 04/28/21      PT SHORT TERM GOAL #4   Title -----      PT SHORT TERM GOAL #5   Title ----    Baseline ---               PT Long Term Goals - 03/31/21 1614       PT LONG TERM GOAL #1   Title Pt will be independent in a home exercise program for continued strengthening and stretching    Baseline --    Time 12    Period Weeks    Status New    Target Date 06/23/21      PT LONG TERM GOAL #2   Title able to have penile penetration vaginally with pain level on the right posterior area of the vaginal canal with pain level 0-1/10 due to elongation of the tissue    Baseline ---    Time 12    Period Weeks    Status New    Target Date 06/23/21      PT LONG TERM GOAL #3   Title Pelvic floor strength >/= 3/5 reducing urinary leakage >/= 70% during the day and during activities    Baseline ---      PT LONG TERM GOAL #4   Title able to bend forward with >/= 90 degree hip flexion without urinary leakage    Baseline --    Time 12    Period Weeks    Status New    Target Date 06/23/21      PT LONG  TERM GOAL #5   Title -----    Baseline -----                   Plan - 04/07/21 1093     Clinical Impression Statement Patient had several trigger points in the pelvic floor muscles. The vaginal vault was high so the therapist was not able to palpate today. Patient muscles softened well with the manual work. Patient will benefit from  skilled therapy to improve pelvic floor lengthening to reduce pelvic pain and improve pelvic strength to reduce leakage.    Personal Factors and Comorbidities Comorbidity 3+;Fitness;Sex;Time since onset of injury/illness/exacerbation    Comorbidities endometrial cancer 2018; total hysterectomy with bilateral salpingo oophreectomy 09/01/2016; compression fracture T12    Examination-Activity Limitations Continence;Lift;Locomotion Level;Other    Stability/Clinical Decision Making Evolving/Moderate complexity    Rehab Potential Excellent    PT Frequency 1x / week    PT Duration 12 weeks    PT Treatment/Interventions ADLs/Self Care Home Management;Biofeedback;Therapeutic activities;Therapeutic exercise;Neuromuscular re-education;Patient/family education;Manual techniques;Dry needling    PT Next Visit Plan manual work to the pelvic floor especially the distal levator ani; work on the left anterior pelvic floor contraction, work on strength especially with hips at 90 degrees; release of the anococcygeal ligament, release around the coccyx    Recommended Other Services MD signed initial eval    Consulted and Agree with Plan of Care Patient             Patient will benefit from skilled therapeutic intervention in order to improve the following deficits and impairments:  Decreased coordination, Increased fascial restricitons, Increased muscle spasms, Decreased activity tolerance, Pain, Decreased mobility, Decreased strength  Visit Diagnosis: Muscle weakness (generalized)  Other muscle spasm  Other lack of coordination     Problem List Patient Active Problem List   Diagnosis Date Noted   Dyspareunia in female 08/25/2019   Vagina, candidiasis 08/26/2018   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 08/26/2018   Vitamin D deficiency 12/25/2017   Physical deconditioning 03/25/2017   Obesity (BMI 30.0-34.9) 03/25/2017   Drug-induced hyperglycemia 12/23/2016   Port catheter in place 12/02/2016   Dysuria  12/02/2016   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

## 2021-04-14 ENCOUNTER — Ambulatory Visit: Payer: Self-pay | Admitting: Physical Therapy

## 2021-04-21 ENCOUNTER — Ambulatory Visit: Payer: Self-pay | Admitting: Physical Therapy

## 2021-04-28 ENCOUNTER — Encounter: Payer: Self-pay | Admitting: Physical Therapy

## 2021-04-28 ENCOUNTER — Ambulatory Visit: Payer: Self-pay | Admitting: Physical Therapy

## 2021-04-28 ENCOUNTER — Other Ambulatory Visit: Payer: Self-pay

## 2021-04-28 ENCOUNTER — Ambulatory Visit: Payer: 59 | Attending: Gynecologic Oncology | Admitting: Physical Therapy

## 2021-04-28 DIAGNOSIS — R278 Other lack of coordination: Secondary | ICD-10-CM | POA: Diagnosis not present

## 2021-04-28 DIAGNOSIS — M6281 Muscle weakness (generalized): Secondary | ICD-10-CM | POA: Diagnosis not present

## 2021-04-28 DIAGNOSIS — M62838 Other muscle spasm: Secondary | ICD-10-CM | POA: Diagnosis not present

## 2021-04-28 NOTE — Therapy (Signed)
[facility] @ [place], [place], [address] Phone: [phone]   Fax:  [phone]  Physical Therapy Treatment  Patient Details  Name: Danielle Guzman MRN: [id number] Date of Birth: March 07, 1966 Referring Provider (PT): Joylene John, NP   Encounter Date: 04/28/2021   PT End of Session - 04/28/21 1533     Visit Number 3    Date for PT Re-Evaluation 06/23/21    Authorization Type Aetna    PT Start Time 1530    PT Stop Time 1610    PT Time Calculation (min) 40 min    Activity Tolerance Patient tolerated treatment well    Behavior During Therapy [facility] for tasks assessed/performed             Past Medical History:  Diagnosis Date   Cancer ([place]) 2018   endometrial    Compression fx, lumbar spine ([place])    Compression Fracture T12   Headache    hx migraines yrs ago   Malaria as child   Miscarriage    x 2   Pneumonia yrs ago   Seizures ([place])    petite mal seizure in high school x 1, none since   Sinus infection 08/2017    Past Surgical History:  Procedure Laterality Date   DENTAL SURGERY     gum graft 20 yrs ago and again on 08/26/16   [place] N/A 08/11/2016   Procedure: [place];  Surgeon: Jerelyn Charles, MD;  Location: [facility];  Service: Gynecology;  Laterality: N/A;  polyp removal   IR FLUORO GUIDE PORT INSERTION RIGHT  11/10/2016   IR REMOVAL TUN ACCESS W/ PORT W/O FL MOD SED  03/11/2017   IR US GUIDE VASC ACCESS RIGHT  11/10/2016   LYMPH NODE BIOPSY N/A 09/01/2016   Procedure: SENTINEL LYMPH NODE BIOPSY;  Surgeon: Everitt Amber, MD;  Location: [facility];  Service: Gynecology;  Laterality: N/A;   ROBOTIC ASSISTED TOTAL HYSTERECTOMY WITH BILATERAL SALPINGO OOPHERECTOMY Bilateral 09/01/2016   Procedure: ROBOTIC ASSISTED TOTAL HYSTERECTOMY WITH BILATERAL SALPINGO OOPHORECTOMY;  Surgeon: Everitt Amber, MD;  Location: [facility];  Service:  Gynecology;  Laterality: Bilateral;   WISDOM TOOTH EXTRACTION      There were no vitals filed for this visit.   Subjective Assessment - 04/28/21 1533     Subjective The urinary leakage is the same. She is using the bigger dilator more. After last session intercourse pain did not go to a 10/10 instead went to 8/10. The pain stayed at an 8/10. After intercourse I felt deeply sore for 2 days.    Patient Stated Goals stop incontinence and sex painfree    Currently in Pain? Yes    Pain Score 8     Pain Location Vagina    Pain Orientation Right;Posterior;Mid    Pain Descriptors / Indicators Other (Comment)   tearing   Pain Onset More than a month ago    Pain Frequency Intermittent    Aggravating Factors  happens when uses the larger dilator and head of penis    Pain Relieving Factors using the smaller dilator    Multiple Pain Sites No                            Pelvic Floor Special Questions - 04/28/21 0001     Pelvic Floor Internal Exam Patient confirms identification and approves PT to assess pelvic floor  and treatment    Exam Type Vaginal    Strength weak squeeze, no lift               [facility] Adult PT Treatment/Exercise - 04/28/21 0001       Lumbar Exercises: Stretches   Other Lumbar Stretch Exercise sitting on lacrosse ball to massage the levator ani in sitting      Lumbar Exercises: Standing   Other Standing Lumbar Exercises power plate on stretch but tigthen the pelvic floor muscles instead of relaxation      Manual Therapy   Manual Therapy Soft tissue mobilization;Internal Pelvic Floor    Soft tissue mobilization to bilateral levator ani and hip adductor and hamstring with butterfly position    Internal Pelvic Floor manual work to the left levator ani while moving the left hip and feeling for fascial releases and moitoring for pain                       PT Short Term Goals - 04/28/21 1613       PT SHORT TERM GOAL #1   Title  independent with initial HEP for manual work to the pelvic floor    Time 4    Period Weeks    Status New    Target Date 04/28/21      PT SHORT TERM GOAL #2   Title pelvic floor tenderness while doing manual work to the right posterior wall of vaginal canal decreased to </= 5/10    Time 4    Period Weeks    Status On-going      PT SHORT TERM GOAL #3   Title able to perfrom diaphragmatic breathing correctly to relax the pelvic floor    Time 4    Period Weeks    Status On-going               PT Long Term Goals - 03/31/21 1614       PT LONG TERM GOAL #1   Title Pt will be independent in a home exercise program for continued strengthening and stretching    Baseline --    Time 12    Period Weeks    Status New    Target Date 06/23/21      PT LONG TERM GOAL #2   Title able to have penile penetration vaginally with pain level on the right posterior area of the vaginal canal with pain level 0-1/10 due to elongation of the tissue    Baseline ---    Time 12    Period Weeks    Status New    Target Date 06/23/21      PT LONG TERM GOAL #3   Title Pelvic floor strength >/= 3/5 reducing urinary leakage >/= 70% during the day and during activities    Baseline ---      PT LONG TERM GOAL #4   Title able to bend forward with >/= 90 degree hip flexion without urinary leakage    Baseline --    Time 12    Period Weeks    Status New    Target Date 06/23/21      PT LONG TERM GOAL #5   Title -----    Baseline -----                   Plan - 04/28/21 1609     Clinical Impression Statement Patient has 8/10 pain with intercourse compared to 10/10. She had  a deep ache after intercourse for 2 days compared to the pain. Patient does not do well with the power plate due to increased tension in her muscles. She has the same leakage with urine at this time. Patient does well with the massage of the pelvic floor musclesusing a ball. Patient will benefit from skilled therapy to  improve pelvic floor lengthing to reduce pelvic pain and improve pelvic strength to reduce leakage.    Personal Factors and Comorbidities Comorbidity 3+;Fitness;Sex;Time since onset of injury/illness/exacerbation    Comorbidities endometrial cancer 2018; total hysterectomy with bilateral salpingo oophreectomy 09/01/2016; compression fracture T12    Examination-Activity Limitations Continence;Lift;Locomotion Level;Other    Stability/Clinical Decision Making Evolving/Moderate complexity    Rehab Potential Excellent    PT Frequency 1x / week    PT Duration 12 weeks    PT Treatment/Interventions ADLs/Self Care Home Management;Biofeedback;Therapeutic activities;Therapeutic exercise;Neuromuscular re-education;Patient/family education;Manual techniques;Dry needling    PT Next Visit Plan manual work to the pelvic floor especially the distal levator ani on the right;  work on the left anterior pelvic floor contraction, work on strength especially with hips at 90 degrees; release of the anococcygeal ligament, release around the coccyx; go over home manual work; diaphragmatic breathing    Consulted and Agree with Plan of Care Patient             Patient will benefit from skilled therapeutic intervention in order to improve the following deficits and impairments:  Decreased coordination, Increased fascial restricitons, Increased muscle spasms, Decreased activity tolerance, Pain, Decreased mobility, Decreased strength  Visit Diagnosis: Muscle weakness (generalized)  Other muscle spasm  Other lack of coordination     Problem List Patient Active Problem List   Diagnosis Date Noted   Dyspareunia in female 08/25/2019   Vagina, candidiasis 08/26/2018   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 08/26/2018   Vitamin D deficiency 12/25/2017   Physical deconditioning 03/25/2017   Obesity (BMI 30.0-34.9) 03/25/2017   Drug-induced hyperglycemia 12/23/2016   Port catheter in place 12/02/2016   Dysuria 12/02/2016    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

## 2021-05-05 ENCOUNTER — Ambulatory Visit: Payer: Self-pay | Admitting: Physical Therapy

## 2021-05-05 ENCOUNTER — Encounter: Payer: 59 | Admitting: Physical Therapy

## 2021-05-09 ENCOUNTER — Other Ambulatory Visit: Payer: Self-pay

## 2021-05-09 ENCOUNTER — Ambulatory Visit: Payer: 59 | Admitting: Physical Therapy

## 2021-05-09 ENCOUNTER — Encounter: Payer: Self-pay | Admitting: Physical Therapy

## 2021-05-09 DIAGNOSIS — M62838 Other muscle spasm: Secondary | ICD-10-CM | POA: Diagnosis not present

## 2021-05-09 DIAGNOSIS — M6281 Muscle weakness (generalized): Secondary | ICD-10-CM

## 2021-05-09 DIAGNOSIS — R278 Other lack of coordination: Secondary | ICD-10-CM | POA: Diagnosis not present

## 2021-05-09 NOTE — Therapy (Signed)
[facility] @ [place], [place], [address] Phone: [phone]   Fax:  [phone]  Physical Therapy Treatment  Patient Details  Name: Danielle Guzman MRN: [id number] Date of Birth: [birth year] Referring Provider (PT): Joylene John, NP   Encounter Date: 05/09/2021   PT End of Session - 05/09/21 1021     Visit Number 4    Date for PT Re-Evaluation 06/23/21    Authorization Type Aetna    Authorization - Visit Number 4    Authorization - Number of Visits 35    PT Start Time 5397    PT Stop Time 1055    PT Time Calculation (min) 40 min    Activity Tolerance Patient tolerated treatment well    Behavior During Therapy [facility] for tasks assessed/performed             Past Medical History:  Diagnosis Date   Cancer ([place]) 2018   endometrial    Compression fx, lumbar spine ([place])    Compression Fracture T12   Headache    hx migraines yrs ago   Malaria as child   Miscarriage    x 2   Pneumonia yrs ago   Seizures ([place])    petite mal seizure in high school x 1, none since   Sinus infection 08/2017    Past Surgical History:  Procedure Laterality Date   DENTAL SURGERY     gum graft 20 yrs ago and again on 08/26/16   [place] N/A 08/11/2016   Procedure: [place];  Surgeon: Jerelyn Charles, MD;  Location: [facility];  Service: Gynecology;  Laterality: N/A;  polyp removal   IR FLUORO GUIDE PORT INSERTION RIGHT  11/10/2016   IR REMOVAL TUN ACCESS W/ PORT W/O FL MOD SED  03/11/2017   IR US GUIDE VASC ACCESS RIGHT  11/10/2016   LYMPH NODE BIOPSY N/A 09/01/2016   Procedure: SENTINEL LYMPH NODE BIOPSY;  Surgeon: Everitt Amber, MD;  Location: [facility];  Service: Gynecology;  Laterality: N/A;   ROBOTIC ASSISTED TOTAL HYSTERECTOMY WITH BILATERAL SALPINGO OOPHERECTOMY Bilateral 09/01/2016   Procedure: ROBOTIC ASSISTED TOTAL HYSTERECTOMY WITH BILATERAL SALPINGO  OOPHORECTOMY;  Surgeon: Everitt Amber, MD;  Location: [facility];  Service: Gynecology;  Laterality: Bilateral;   WISDOM TOOTH EXTRACTION      There were no vitals filed for this visit.   Subjective Assessment - 05/09/21 1022     Subjective I have not had sex since last visit. I have been using my dilator. I still hit the one spot and it hurts but it is feeling better. Leakage is better. When I bend over and go into standing I leak.    Patient Stated Goals stop incontinence and sex painfree    Currently in Pain? Yes    Pain Score 8     Pain Location Vagina    Pain Orientation Right;Posterior;Mid    Pain Descriptors / Indicators Other (Comment)   tearing   Pain Onset More than a month ago    Pain Frequency Intermittent    Aggravating Factors  happens when uses the larger dilator and head of penis    Pain Relieving Factors using the smaller dilator    Multiple Pain Sites No                            Pelvic Floor Special Questions - 05/09/21 0001  Pelvic Floor Internal Exam Patient confirms identification and approves PT to assess pelvic floor and treatment    Exam Type Vaginal    Strength fair squeeze, definite lift   weak lift but not full hug of therapist index finger              [facility] Adult PT Treatment/Exercise - 05/09/21 0001       Lumbar Exercises: Stretches   Other Lumbar Stretch Exercise prone with hip stretchng the muscles around the coccyx      Lumbar Exercises: Supine   Isometric Hip Flexion 10 reps;1 second   reight, left   Isometric Hip Flexion Limitations using a ball to push in; contract the abdominals and pelvic floor      Lumbar Exercises: Quadruped   Other Quadruped Lumbar Exercises hip hinging with ball squeeze and pelvic floor contraction and abdominal contraction      Manual Therapy   Manual Therapy Internal Pelvic Floor    Internal Pelvic Floor manual work to the perineal body, along the piriformis, coccygeus, levator ani with one  finger internally and externally, worked around the ischial spine                     PT Education - 05/09/21 1042     Education Details Access Code: [email or web address]    Person(s) Educated Patient    Methods Explanation;Demonstration;Verbal cues;Handout    Comprehension Returned demonstration;Verbalized understanding              PT Short Term Goals - 05/09/21 1103       PT SHORT TERM GOAL #1   Title independent with initial HEP for manual work to the pelvic floor    Time 4    Period Weeks    Status Achieved      PT SHORT TERM GOAL #2   Title pelvic floor tenderness while doing manual work to the right posterior wall of vaginal canal decreased to </= 5/10    Time 4    Period Weeks    Status On-going      PT SHORT TERM GOAL #3   Title able to perfrom diaphragmatic breathing correctly to relax the pelvic floor    Time 4    Period Weeks    Status Achieved               PT Long Term Goals - 03/31/21 1614       PT LONG TERM GOAL #1   Title Pt will be independent in a home exercise program for continued strengthening and stretching    Baseline --    Time 12    Period Weeks    Status New    Target Date 06/23/21      PT LONG TERM GOAL #2   Title able to have penile penetration vaginally with pain level on the right posterior area of the vaginal canal with pain level 0-1/10 due to elongation of the tissue    Baseline ---    Time 12    Period Weeks    Status New    Target Date 06/23/21      PT LONG TERM GOAL #3   Title Pelvic floor strength >/= 3/5 reducing urinary leakage >/= 70% during the day and during activities    Baseline ---      PT LONG TERM GOAL #4   Title able to bend forward with >/= 90 degree hip flexion without urinary leakage    Baseline --  Time 12    Period Weeks    Status New    Target Date 06/23/21      PT LONG TERM GOAL #5   Title -----    Baseline -----                   Plan - 05/09/21 1042     Clinical  Impression Statement Patient reports her leakage is better. She will leak with bending forward and going from sit to stand. Pelvic floor strength is 3/5 with a weak lift and not able to hug the therapist finger yet. She had lots of fascial releases of the pelvic floor muscles. She is working on engaging her lower abdominals due to difficulty. Patient will benefit from skilled therapy to improve pelvic floor lengthening to reduce pelvic pain and improve pelvic strength to reduce leakage.    Personal Factors and Comorbidities Comorbidity 3+;Fitness;Sex;Time since onset of injury/illness/exacerbation    Comorbidities endometrial cancer 2018; total hysterectomy with bilateral salpingo oophreectomy 09/01/2016; compression fracture T12    Examination-Activity Limitations Continence;Lift;Locomotion Level;Other    Stability/Clinical Decision Making Evolving/Moderate complexity    Rehab Potential Excellent    PT Frequency 1x / week    PT Duration 12 weeks    PT Treatment/Interventions ADLs/Self Care Home Management;Biofeedback;Therapeutic activities;Therapeutic exercise;Neuromuscular re-education;Patient/family education;Manual techniques;Dry needling    PT Next Visit Plan manual work to the pelvic floor especially the distal levator ani on the right;  work on the left anterior pelvic floor contraction, pelvic floor strength for a circular contraction    PT Home Exercise Plan Access Code: [email or web address]    Consulted and Agree with Plan of Care Patient             Patient will benefit from skilled therapeutic intervention in order to improve the following deficits and impairments:  Decreased coordination, Increased fascial restricitons, Increased muscle spasms, Decreased activity tolerance, Pain, Decreased mobility, Decreased strength  Visit Diagnosis: Muscle weakness (generalized)  Other muscle spasm  Other lack of coordination     Problem List Patient Active Problem List   Diagnosis Date Noted    Dyspareunia in female 08/25/2019   Vagina, candidiasis 08/26/2018   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 08/26/2018   Vitamin D deficiency 12/25/2017   Physical deconditioning 03/25/2017   Obesity (BMI 30.0-34.9) 03/25/2017   Drug-induced hyperglycemia 12/23/2016   Port catheter in place 12/02/2016   Dysuria 12/02/2016   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

## 2021-05-09 NOTE — Patient Instructions (Signed)
Access Code: [email or web address] URL: [email or web address] Date: 05/09/2021 Prepared by: Earlie Counts  Exercises Pigeon Pose - 1 x daily - 7 x weekly - 1 sets - 2 reps - 30 sec hold Hip Hinge Rock Back - 1 x daily - 7 x weekly - 2 sets - 10 reps Hooklying Isometric Hip Flexion - 1 x daily - 7 x weekly - 2 sets - 10 reps [facility] [address], [place], [place] [address] Phone # [phone] Fax [phone]

## 2021-05-12 ENCOUNTER — Ambulatory Visit: Payer: 59 | Admitting: Physical Therapy

## 2021-05-12 ENCOUNTER — Ambulatory Visit: Payer: Self-pay | Admitting: Physical Therapy

## 2021-05-12 ENCOUNTER — Other Ambulatory Visit: Payer: Self-pay

## 2021-05-12 ENCOUNTER — Encounter: Payer: Self-pay | Admitting: Physical Therapy

## 2021-05-12 DIAGNOSIS — M6281 Muscle weakness (generalized): Secondary | ICD-10-CM | POA: Diagnosis not present

## 2021-05-12 DIAGNOSIS — R278 Other lack of coordination: Secondary | ICD-10-CM

## 2021-05-12 DIAGNOSIS — M62838 Other muscle spasm: Secondary | ICD-10-CM | POA: Diagnosis not present

## 2021-05-12 NOTE — Therapy (Signed)
[facility] @ [place], [place], [address] Phone: [phone]   Fax:  [phone]  Physical Therapy Treatment  Patient Details  Name: Danielle Guzman MRN: [id number] Date of Birth: Mar 26, 1965 Referring Provider (PT): Joylene John, NP   Encounter Date: 05/12/2021   PT End of Session - 05/12/21 0939     Visit Number 5    Date for PT Re-Evaluation 06/23/21    Authorization Type Aetna    Authorization - Visit Number 5    Authorization - Number of Visits 35    PT Start Time 0947   came late   PT Stop Time 1010    PT Time Calculation (min) 30 min    Activity Tolerance Patient tolerated treatment well    Behavior During Therapy [facility] for tasks assessed/performed             Past Medical History:  Diagnosis Date   Cancer ([place]) 2018   endometrial    Compression fx, lumbar spine ([place])    Compression Fracture T12   Headache    hx migraines yrs ago   Malaria as child   Miscarriage    x 2   Pneumonia yrs ago   Seizures ([place])    petite mal seizure in high school x 1, none since   Sinus infection 08/2017    Past Surgical History:  Procedure Laterality Date   DENTAL SURGERY     gum graft 20 yrs ago and again on 08/26/16   [place] N/A 08/11/2016   Procedure: [place];  Surgeon: Jerelyn Charles, MD;  Location: [facility];  Service: Gynecology;  Laterality: N/A;  polyp removal   IR FLUORO GUIDE PORT INSERTION RIGHT  11/10/2016   IR REMOVAL TUN ACCESS W/ PORT W/O FL MOD SED  03/11/2017   IR US GUIDE VASC ACCESS RIGHT  11/10/2016   LYMPH NODE BIOPSY N/A 09/01/2016   Procedure: SENTINEL LYMPH NODE BIOPSY;  Surgeon: Everitt Amber, MD;  Location: [facility];  Service: Gynecology;  Laterality: N/A;   ROBOTIC ASSISTED TOTAL HYSTERECTOMY WITH BILATERAL SALPINGO OOPHERECTOMY Bilateral 09/01/2016   Procedure: ROBOTIC ASSISTED TOTAL HYSTERECTOMY WITH BILATERAL  SALPINGO OOPHORECTOMY;  Surgeon: Everitt Amber, MD;  Location: [facility];  Service: Gynecology;  Laterality: Bilateral;   WISDOM TOOTH EXTRACTION      There were no vitals filed for this visit.   Subjective Assessment - 05/12/21 0940     Subjective I am not sure I am doing the hinging correctly. Dilator is more comfortable.    Patient Stated Goals stop incontinence and sex painfree    Currently in Pain? Yes    Pain Score 8     Pain Location Vagina    Pain Orientation Right;Posterior;Mid    Pain Descriptors / Indicators Other (Comment)   tearing   Pain Type Chronic pain    Pain Onset More than a month ago    Pain Frequency Intermittent    Aggravating Factors  happens when uses the larger dilator and head of penis    Pain Relieving Factors using the smaller dilator    Multiple Pain Sites No                            Pelvic Floor Special Questions - 05/12/21 0001     Pelvic Floor Internal Exam Patient confirms identification and approves PT to assess pelvic floor and treatment  Exam Type Vaginal    Strength fair squeeze, definite lift   good circular contraction              [facility] Adult PT Treatment/Exercise - 05/12/21 0001       Lumbar Exercises: Standing   Other Standing Lumbar Exercises hip hinging in standing with pelvic floor contraction and using a green band to assis with hip hinging      Lumbar Exercises: Quadruped   Other Quadruped Lumbar Exercises hip hinging with keepd spinal neutral, making sure she is breathing, engaging the abdominal and pelvic floor      Manual Therapy   Manual Therapy Internal Pelvic Floor    Internal Pelvic Floor manual work to bilateral levator ani and obturator internist and posterior vaginal canal                     PT Education - 05/12/21 1010     Education Details hip hinging in standing with green band    Person(s) Educated Patient    Methods Explanation;Demonstration    Comprehension Verbalized  understanding;Returned demonstration              PT Short Term Goals - 05/12/21 1014       PT SHORT TERM GOAL #1   Title independent with initial HEP for manual work to the pelvic floor    Time 4    Period Weeks    Status Achieved      PT SHORT TERM GOAL #2   Title pelvic floor tenderness while doing manual work to the right posterior wall of vaginal canal decreased to </= 5/10    Time 4    Period Weeks    Status Achieved      PT SHORT TERM GOAL #3   Title able to perfrom diaphragmatic breathing correctly to relax the pelvic floor    Time 4    Period Weeks    Status Achieved               PT Long Term Goals - 03/31/21 1614       PT LONG TERM GOAL #1   Title Pt will be independent in a home exercise program for continued strengthening and stretching    Baseline --    Time 12    Period Weeks    Status New    Target Date 06/23/21      PT LONG TERM GOAL #2   Title able to have penile penetration vaginally with pain level on the right posterior area of the vaginal canal with pain level 0-1/10 due to elongation of the tissue    Baseline ---    Time 12    Period Weeks    Status New    Target Date 06/23/21      PT LONG TERM GOAL #3   Title Pelvic floor strength >/= 3/5 reducing urinary leakage >/= 70% during the day and during activities    Baseline ---      PT LONG TERM GOAL #4   Title able to bend forward with >/= 90 degree hip flexion without urinary leakage    Baseline --    Time 12    Period Weeks    Status New    Target Date 06/23/21      PT LONG TERM GOAL #5   Title -----    Baseline -----  Plan - 05/12/21 1011     Clinical Impression Statement Patient reports she is having less leakage. Pelvic floor strength is 3/5 with good circular contraction and good lift. She has improve tissue mobitiy in the pelvic floor and left side is better than right. Patient is able to hip hinge and not leak but she feel the sensation of  her possible going to leak. Patient will benefit from skilled therapy to improve pelvic floor lengthening to reduce pelvic pain and improve pelvic strength to reduce leakage.    Personal Factors and Comorbidities Comorbidity 3+;Fitness;Sex;Time since onset of injury/illness/exacerbation    Comorbidities endometrial cancer 2018; total hysterectomy with bilateral salpingo oophreectomy 09/01/2016; compression fracture T12    Examination-Activity Limitations Continence;Lift;Locomotion Level;Other    Stability/Clinical Decision Making Evolving/Moderate complexity    Rehab Potential Excellent    PT Frequency 1x / week    PT Duration 12 weeks    PT Treatment/Interventions ADLs/Self Care Home Management;Biofeedback;Therapeutic activities;Therapeutic exercise;Neuromuscular re-education;Patient/family education;Manual techniques;Dry needling    PT Next Visit Plan manual work to the pelvic floor especially the right side; ;  work on the left anterior pelvic floor contraction, pelvic floor strength for a circular contraction; see how hip hinging is going    PT Home Exercise Plan Access Code: [email or web address]    Consulted and Agree with Plan of Care Patient             Patient will benefit from skilled therapeutic intervention in order to improve the following deficits and impairments:  Decreased coordination, Increased fascial restricitons, Increased muscle spasms, Decreased activity tolerance, Pain, Decreased mobility, Decreased strength  Visit Diagnosis: Muscle weakness (generalized)  Other muscle spasm  Other lack of coordination     Problem List Patient Active Problem List   Diagnosis Date Noted   Dyspareunia in female 08/25/2019   Vagina, candidiasis 08/26/2018   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 08/26/2018   Vitamin D deficiency 12/25/2017   Physical deconditioning 03/25/2017   Obesity (BMI 30.0-34.9) 03/25/2017   Drug-induced hyperglycemia 12/23/2016   Port catheter in place 12/02/2016   Dysuria  12/02/2016   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

## 2021-05-19 ENCOUNTER — Encounter: Payer: 59 | Admitting: Physical Therapy

## 2021-06-02 ENCOUNTER — Ambulatory Visit: Payer: 59 | Attending: Gynecologic Oncology | Admitting: Physical Therapy

## 2021-06-02 ENCOUNTER — Encounter: Payer: Self-pay | Admitting: Physical Therapy

## 2021-06-02 ENCOUNTER — Other Ambulatory Visit: Payer: Self-pay

## 2021-06-02 DIAGNOSIS — M62838 Other muscle spasm: Secondary | ICD-10-CM | POA: Insufficient documentation

## 2021-06-02 DIAGNOSIS — R278 Other lack of coordination: Secondary | ICD-10-CM | POA: Diagnosis not present

## 2021-06-02 DIAGNOSIS — M6281 Muscle weakness (generalized): Secondary | ICD-10-CM | POA: Insufficient documentation

## 2021-06-02 NOTE — Therapy (Addendum)
[facility] @ [place], [place], [address] Phone: [phone]   Fax:  [phone]  Physical Therapy Treatment  Patient Details  Name: Danielle Guzman MRN: [id number] Date of Birth: 1965-03-26 Referring Provider (PT): Joylene John, NP   Encounter Date: 06/02/2021   PT End of Session - 06/02/21 0934     Visit Number 6    Date for PT Re-Evaluation 06/23/21    Authorization Type Aetna    Authorization - Visit Number 6    Authorization - Number of Visits 35    PT Start Time 0930    PT Stop Time 1010    PT Time Calculation (min) 40 min    Activity Tolerance Patient tolerated treatment well    Behavior During Therapy [facility] for tasks assessed/performed             Past Medical History:  Diagnosis Date   Cancer ([place]) 2018   endometrial    Compression fx, lumbar spine ([place])    Compression Fracture T12   Headache    hx migraines yrs ago   Malaria as child   Miscarriage    x 2   Pneumonia yrs ago   Seizures ([place])    petite mal seizure in high school x 1, none since   Sinus infection 08/2017    Past Surgical History:  Procedure Laterality Date   DENTAL SURGERY     gum graft 20 yrs ago and again on 08/26/16   [place] N/A 08/11/2016   Procedure: [place];  Surgeon: Jerelyn Charles, MD;  Location: [facility];  Service: Gynecology;  Laterality: N/A;  polyp removal   IR FLUORO GUIDE PORT INSERTION RIGHT  11/10/2016   IR REMOVAL TUN ACCESS W/ PORT W/O FL MOD SED  03/11/2017   IR US GUIDE VASC ACCESS RIGHT  11/10/2016   LYMPH NODE BIOPSY N/A 09/01/2016   Procedure: SENTINEL LYMPH NODE BIOPSY;  Surgeon: Everitt Amber, MD;  Location: [facility];  Service: Gynecology;  Laterality: N/A;   ROBOTIC ASSISTED TOTAL HYSTERECTOMY WITH BILATERAL SALPINGO OOPHERECTOMY Bilateral 09/01/2016   Procedure: ROBOTIC ASSISTED TOTAL HYSTERECTOMY WITH BILATERAL SALPINGO  OOPHORECTOMY;  Surgeon: Everitt Amber, MD;  Location: [facility];  Service: Gynecology;  Laterality: Bilateral;   WISDOM TOOTH EXTRACTION      There were no vitals filed for this visit.   Subjective Assessment - 06/02/21 0935     Subjective Pain with intercourse is not as intense and happens at one spot. Once he goes past the point he is able to go deeper. The urinary leakage is better. Leak with sit to stand and stand to sit. Does not have the leakage with just standing ust with hinging of the hips.    Patient Stated Goals stop incontinence and sex painfree    Currently in Pain? Yes    Pain Score 7     Pain Location Vagina    Pain Orientation Right;Posterior;Mid    Pain Descriptors / Indicators Other (Comment)   tearing   Pain Type Chronic pain    Pain Onset More than a month ago    Pain Frequency Intermittent    Aggravating Factors  happens when uses the larger dilator and head of penis    Pain Relieving Factors uing the smaller dilator    Multiple Pain Sites No  Pelvic Floor Special Questions - 06/02/21 0001     Pelvic Floor Internal Exam Patient confirms identification and approves PT to assess pelvic floor and treatment    Exam Type Vaginal    Strength fair squeeze, definite lift   good circular contraction              [facility] Adult PT Treatment/Exercise - 06/02/21 0001       Manual Therapy   Manual Therapy Internal Pelvic Floor    Manual therapy comments educated patient on how to work on the areas at home in [place]    Internal Pelvic Floor manual work to the right puborectalis with pelvic anterior and posterior movement, work on the obturator internist wiht hip internal rotaiton and contract relax; release of the perineal body with one finger internal and other external and the superior transverse perineum; release of the right anterior suprapubic area wiht therapist mobiliizing the bladder.   right sidely                     PT Education - 06/02/21 1013     Education Details educated patient on how to mobilize the perineal body and levator ani in sidely    Person(s) Educated Patient    Methods Explanation    Comprehension Verbalized understanding              PT Short Term Goals - 05/12/21 1014       PT SHORT TERM GOAL #1   Title independent with initial HEP for manual work to the pelvic floor    Time 4    Period Weeks    Status Achieved      PT SHORT TERM GOAL #2   Title pelvic floor tenderness while doing manual work to the right posterior wall of vaginal canal decreased to </= 5/10    Time 4    Period Weeks    Status Achieved      PT SHORT TERM GOAL #3   Title able to perfrom diaphragmatic breathing correctly to relax the pelvic floor    Time 4    Period Weeks    Status Achieved               PT Long Term Goals - 03/31/21 1614       PT LONG TERM GOAL #1   Title Pt will be independent in a home exercise program for continued strengthening and stretching    Baseline --    Time 12    Period Weeks    Status New    Target Date 06/23/21      PT LONG TERM GOAL #2   Title able to have penile penetration vaginally with pain level on the right posterior area of the vaginal canal with pain level 0-1/10 due to elongation of the tissue    Baseline ---    Time 12    Period Weeks    Status New    Target Date 06/23/21      PT LONG TERM GOAL #3   Title Pelvic floor strength >/= 3/5 reducing urinary leakage >/= 70% during the day and during activities    Baseline ---      PT LONG TERM GOAL #4   Title able to bend forward with >/= 90 degree hip flexion without urinary leakage    Baseline --    Time 12    Period Weeks    Status New    Target Date 06/23/21  PT LONG TERM GOAL #5   Title -----    Baseline -----                   Plan - 06/02/21 1014     Clinical Impression Statement Patient is leaking urine with hip hinging but not other times.  She still has a certain area of discomfort in the vaginal canal and once her husband gets past the area there is no pain. She has tightness in the puborectalis, perineal body, superior transverse perineum, and obturator internist and understands how to reach the area in sidely. Patient is able to perform more of a circular contraction with hug. Patient will benefit from skilled therapy to improve pelvic floor lengthing to reduce pelvic pain and improve pelvic strength and reduce leakage.    Personal Factors and Comorbidities Comorbidity 3+;Fitness;Sex;Time since onset of injury/illness/exacerbation    Comorbidities endometrial cancer 2018; total hysterectomy with bilateral salpingo oophreectomy 09/01/2016; compression fracture T12    Examination-Activity Limitations Continence;Lift;Locomotion Level;Other    Stability/Clinical Decision Making Evolving/Moderate complexity    Rehab Potential Excellent    PT Frequency 1x / week    PT Duration 12 weeks    PT Treatment/Interventions ADLs/Self Care Home Management;Biofeedback;Therapeutic activities;Therapeutic exercise;Neuromuscular re-education;Patient/family education;Manual techniques;Dry needling    PT Next Visit Plan manual work to the pelvic floor especially the right side in right sidely, work on hip hinge contraction with therapist finger in the vaginal canal    PT Home Exercise Plan Access Code: [email or web address]    Consulted and Agree with Plan of Care Patient             Patient will benefit from skilled therapeutic intervention in order to improve the following deficits and impairments:  Decreased coordination, Increased fascial restricitons, Increased muscle spasms, Decreased activity tolerance, Pain, Decreased mobility, Decreased strength  Visit Diagnosis: Muscle weakness (generalized)  Other muscle spasm  Other lack of coordination     Problem List Patient Active Problem List   Diagnosis Date Noted   Dyspareunia in female 08/25/2019    Vagina, candidiasis 08/26/2018   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 08/26/2018   Vitamin D deficiency 12/25/2017   Physical deconditioning 03/25/2017   Obesity (BMI 30.0-34.9) 03/25/2017   Drug-induced hyperglycemia 12/23/2016   Port catheter in place 12/02/2016   Dysuria 12/02/2016   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 Unable to assess patient due to her not returning since 06/02/21.    Remaining deficits: See above.    Education / Equipment: HEP   Patient agrees to discharge. Patient goals were not met. Patient is being discharged due to not returning since the last visit. Thank you for the referral. Earlie Counts, PT 08/07/21 11:37 AM

## 2021-06-09 ENCOUNTER — Encounter: Payer: 59 | Admitting: Physical Therapy

## 2021-06-16 ENCOUNTER — Encounter: Payer: 59 | Admitting: Physical Therapy

## 2021-06-18 ENCOUNTER — Ambulatory Visit: Payer: 59 | Admitting: Physical Therapy

## 2021-08-18 ENCOUNTER — Ambulatory Visit: Payer: 59 | Admitting: Gynecologic Oncology

## 2021-08-20 ENCOUNTER — Ambulatory Visit: Payer: 59 | Admitting: Gynecologic Oncology

## 2021-08-20 ENCOUNTER — Encounter: Payer: Self-pay | Admitting: Gynecologic Oncology

## 2021-08-20 ENCOUNTER — Telehealth: Payer: Self-pay | Admitting: *Deleted

## 2021-08-20 NOTE — Telephone Encounter (Signed)
Spoke with pt this afternoon who stated that she's experiencing vaginal itchiness, milky discharge and a slight odor. She denies fevers and pain and any other complications. She has been using nystatin cream to help with the itchiness. She stated she often gets these infection. She would like a refill on her nystatin cream and diflucan when she comes for her appointment with Joylene John, NP.  Joylene John, NP notified.

## 2021-08-21 ENCOUNTER — Other Ambulatory Visit: Payer: Self-pay | Admitting: Gynecologic Oncology

## 2021-08-21 DIAGNOSIS — B3731 Acute candidiasis of vulva and vagina: Secondary | ICD-10-CM

## 2021-08-21 DIAGNOSIS — C541 Malignant neoplasm of endometrium: Secondary | ICD-10-CM

## 2021-08-21 MED ORDER — NYSTATIN 100000 UNIT/GM EX CREA: 1.0000 "application " | TOPICAL_CREAM | Freq: Two times a day (BID) | CUTANEOUS | 3 refills | Status: DC

## 2021-08-21 MED ORDER — FLUCONAZOLE 150 MG PO TABS: 150.0000 mg | ORAL_TABLET | ORAL | 2 refills | Status: DC | PRN

## 2021-08-21 NOTE — Telephone Encounter (Signed)
Spoke with the patient and she stated "I am doing better and it has cleared up a lot. I wanted to give Melissa a heads up that I would be asking for the prescriptions at the appt on Friday for a as needed thing going forwarded. She has done that before. I can't really reschedule my appt for tomorrow." Explained that the message would be given to [facility] APP and the office would call her back

## 2021-08-21 NOTE — Progress Notes (Signed)
See RN note. Plan for refills on diflucan and nystatin for vulvar/vag candidiasis.

## 2021-08-21 NOTE — Telephone Encounter (Signed)
LVM. Per Joylene John NP, refill of Nystatin cream and Diflucan has been refilled. Pt needs to go ahead and start. Per Lenna Sciara, move appointment for tomorrow out at least 1 week to make sure everything has cleared up.

## 2021-08-21 NOTE — Telephone Encounter (Signed)
Spoke with the patient and explained that she can keep her appt for tomorrow

## 2021-08-22 ENCOUNTER — Other Ambulatory Visit: Payer: Self-pay

## 2021-08-22 ENCOUNTER — Inpatient Hospital Stay: Payer: 59 | Attending: Gynecologic Oncology | Admitting: Gynecologic Oncology

## 2021-08-22 ENCOUNTER — Inpatient Hospital Stay: Payer: 59

## 2021-08-22 ENCOUNTER — Inpatient Hospital Stay: Payer: 59 | Admitting: Gynecologic Oncology

## 2021-08-22 ENCOUNTER — Telehealth: Payer: Self-pay

## 2021-08-22 VITALS — BP 122/70 | HR 67 | Temp 99.0°F | Resp 16 | Wt 207.4 lb

## 2021-08-22 DIAGNOSIS — C541 Malignant neoplasm of endometrium: Secondary | ICD-10-CM

## 2021-08-22 DIAGNOSIS — Z08 Encounter for follow-up examination after completed treatment for malignant neoplasm: Secondary | ICD-10-CM | POA: Diagnosis not present

## 2021-08-22 DIAGNOSIS — D649 Anemia, unspecified: Secondary | ICD-10-CM

## 2021-08-22 DIAGNOSIS — E559 Vitamin D deficiency, unspecified: Secondary | ICD-10-CM

## 2021-08-22 DIAGNOSIS — Z1509 Genetic susceptibility to other malignant neoplasm: Secondary | ICD-10-CM | POA: Insufficient documentation

## 2021-08-22 DIAGNOSIS — Z800 Family history of malignant neoplasm of digestive organs: Secondary | ICD-10-CM | POA: Diagnosis not present

## 2021-08-22 DIAGNOSIS — R635 Abnormal weight gain: Secondary | ICD-10-CM

## 2021-08-22 DIAGNOSIS — Z803 Family history of malignant neoplasm of breast: Secondary | ICD-10-CM | POA: Diagnosis not present

## 2021-08-22 DIAGNOSIS — Z90710 Acquired absence of both cervix and uterus: Secondary | ICD-10-CM | POA: Diagnosis not present

## 2021-08-22 DIAGNOSIS — Z90722 Acquired absence of ovaries, bilateral: Secondary | ICD-10-CM | POA: Diagnosis not present

## 2021-08-22 DIAGNOSIS — Z9221 Personal history of antineoplastic chemotherapy: Secondary | ICD-10-CM | POA: Diagnosis not present

## 2021-08-22 DIAGNOSIS — Z8542 Personal history of malignant neoplasm of other parts of uterus: Secondary | ICD-10-CM | POA: Diagnosis not present

## 2021-08-22 DIAGNOSIS — Z8049 Family history of malignant neoplasm of other genital organs: Secondary | ICD-10-CM | POA: Insufficient documentation

## 2021-08-22 LAB — COMPREHENSIVE METABOLIC PANEL
ALT: 24 U/L (ref 0–44)
AST: 20 U/L (ref 15–41)
Albumin: 4.3 g/dL (ref 3.5–5.0)
Alkaline Phosphatase: 114 U/L (ref 38–126)
Anion gap: 6 (ref 5–15)
BUN: 18 mg/dL (ref 6–20)
CO2: 28 mmol/L (ref 22–32)
Calcium: 9.6 mg/dL (ref 8.9–10.3)
Chloride: 104 mmol/L (ref 98–111)
Creatinine, Ser: 0.84 mg/dL (ref 0.44–1.00)
GFR, Estimated: >60 mL/min (ref >60)
Glucose, Bld: 105 mg/dL — ABNORMAL HIGH (ref 70–99)
Potassium: 4.1 mmol/L (ref 3.5–5.1)
Sodium: 138 mmol/L (ref 135–145)
Total Bilirubin: 0.4 mg/dL (ref 0.3–1.2)
Total Protein: 6.7 g/dL (ref 6.5–8.1)

## 2021-08-22 LAB — CBC (CANCER CENTER ONLY)
HCT: 42.7 % (ref 36.0–46.0)
Hemoglobin: 14.3 g/dL (ref 12.0–15.0)
MCH: 28 pg (ref 26.0–34.0)
MCHC: 33.5 g/dL (ref 30.0–36.0)
MCV: 83.6 fL (ref 80.0–100.0)
Platelet Count: 252 10*3/uL (ref 150–400)
RBC: 5.11 MIL/uL (ref 3.87–5.11)
RDW: 13.4 % (ref 11.5–15.5)
WBC Count: 6.6 10*3/uL (ref 4.0–10.5)
nRBC: 0 % (ref 0.0–0.2)

## 2021-08-22 LAB — VITAMIN D 25 HYDROXY (VIT D DEFICIENCY, FRACTURES): Vit D, 25-Hydroxy: 13.03 ng/mL — ABNORMAL LOW (ref 30–100)

## 2021-08-22 NOTE — Patient Instructions (Addendum)
We will plan to check a vitamin d level today and contact you with the results.   It was great seeing you today. No concerning findings on today's exam.   Plan to follow up in six months or sooner if needed. Our scheduler is working on the fall schedule so if you would like to call back in August or September, we will get you scheduled for a December appointment in the office.   Symptoms to report to your health care team include vaginal bleeding, rectal bleeding, bloating, weight loss without effort, new and persistent pain, new and  persistent fatigue, new leg swelling, new masses (i.e., bumps in your neck or groin), new and persistent cough, new and persistent nausea and vomiting, change in bowel or bladder habits, and any other concerns.   If you become interested in having a mammogram or colonoscopy given your history, please let me know. If not, continue with your examinations at home.

## 2021-08-22 NOTE — Telephone Encounter (Signed)
Spoke with pt regarding some of her lab results from today's office visit. Per Joylene John, NP the patient was informed that the CBC and CMP results are normal. Pt is aware that someone from our office will be calling her next week with her Vitamin D and TSH results.

## 2021-08-25 ENCOUNTER — Other Ambulatory Visit: Payer: Self-pay | Admitting: Gynecologic Oncology

## 2021-08-25 DIAGNOSIS — E559 Vitamin D deficiency, unspecified: Secondary | ICD-10-CM

## 2021-08-25 LAB — TSH: TSH: 2.24 u[IU]/mL (ref 0.350–4.500)

## 2021-08-25 MED ORDER — VITAMIN D (ERGOCALCIFEROL) 1.25 MG (50000 UNIT) PO CAPS: 50000.0000 [IU] | ORAL_CAPSULE | ORAL | 0 refills | Status: DC

## 2021-08-25 NOTE — Progress Notes (Signed)
Spoke with patient about Vitamin d results and TSH results. Plan for 6 weeks of vitamin d 50, 000 units once a week then maintaining a maintenance dose of 1000-2000 units daily.
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
## 2021-10-12 ENCOUNTER — Other Ambulatory Visit: Payer: Self-pay | Admitting: Gynecologic Oncology

## 2021-10-12 DIAGNOSIS — E559 Vitamin D deficiency, unspecified: Secondary | ICD-10-CM

## 2021-10-13 NOTE — Telephone Encounter (Signed)
I spoke to pt regarding the refill of Vitamin D 50,000U, request that was received from her pharmacy. Pt states it was a mistake, the pharmacy did not give her the full amount of the original prescription. When she went to get the other two they thought she needed a refill. She has two more weeks and then she will start the OTC Vitamin D 2,000 units.   Joylene John NP notified.

## 2021-10-28 ENCOUNTER — Other Ambulatory Visit: Payer: Self-pay | Admitting: Gynecologic Oncology

## 2021-10-28 DIAGNOSIS — E559 Vitamin D deficiency, unspecified: Secondary | ICD-10-CM

## 2021-12-03 ENCOUNTER — Telehealth: Payer: Self-pay

## 2021-12-03 NOTE — Telephone Encounter (Signed)
Patient returned call and we rescheduled appointment from 12/6  to 12/11 at 1:30pm.  Patient confirmed and understood.

## 2021-12-03 NOTE — Telephone Encounter (Signed)
Left message for patient to return call and have follow up appointment rescheduled from 12/6 to 12/5 as Joylene John, NP is in surgery that day.

## 2021-12-18 ENCOUNTER — Telehealth: Payer: Self-pay

## 2021-12-18 NOTE — Telephone Encounter (Signed)
Spoke with patient her work schedule has changed and she needed to change her appointment from 12/11 to 12/18.  Rescheduled for 12/18 at 1:30pm.  Patient confirmed and understood.

## 2022-02-18 ENCOUNTER — Ambulatory Visit: Payer: 59 | Admitting: Gynecologic Oncology

## 2022-02-20 ENCOUNTER — Ambulatory Visit: Payer: 59 | Admitting: Gynecologic Oncology

## 2022-02-23 ENCOUNTER — Ambulatory Visit: Payer: 59 | Admitting: Gynecologic Oncology

## 2022-02-24 ENCOUNTER — Telehealth: Payer: Self-pay | Admitting: *Deleted

## 2022-02-24 NOTE — Telephone Encounter (Signed)
LMOM for the patient to call the office back. Patient appt on 12/18 needs to be moved to earlier in the day or moved to another day

## 2022-02-24 NOTE — Telephone Encounter (Signed)
Patient called back and appt moved

## 2022-02-27 ENCOUNTER — Encounter: Payer: Self-pay | Admitting: Gynecologic Oncology

## 2022-03-02 ENCOUNTER — Inpatient Hospital Stay: Payer: 59 | Attending: Gynecologic Oncology | Admitting: Gynecologic Oncology

## 2022-03-02 ENCOUNTER — Other Ambulatory Visit: Payer: Self-pay

## 2022-03-02 ENCOUNTER — Inpatient Hospital Stay: Payer: 59

## 2022-03-02 VITALS — BP 134/82 | HR 63 | Temp 98.4°F | Resp 16 | Wt 212.8 lb

## 2022-03-02 DIAGNOSIS — Z90710 Acquired absence of both cervix and uterus: Secondary | ICD-10-CM | POA: Diagnosis not present

## 2022-03-02 DIAGNOSIS — Z800 Family history of malignant neoplasm of digestive organs: Secondary | ICD-10-CM | POA: Insufficient documentation

## 2022-03-02 DIAGNOSIS — Z1501 Genetic susceptibility to malignant neoplasm of breast: Secondary | ICD-10-CM | POA: Diagnosis not present

## 2022-03-02 DIAGNOSIS — Z1502 Genetic susceptibility to malignant neoplasm of ovary: Secondary | ICD-10-CM | POA: Insufficient documentation

## 2022-03-02 DIAGNOSIS — Z8542 Personal history of malignant neoplasm of other parts of uterus: Secondary | ICD-10-CM | POA: Diagnosis not present

## 2022-03-02 DIAGNOSIS — Z1509 Genetic susceptibility to other malignant neoplasm: Secondary | ICD-10-CM | POA: Insufficient documentation

## 2022-03-02 DIAGNOSIS — Z90722 Acquired absence of ovaries, bilateral: Secondary | ICD-10-CM | POA: Diagnosis not present

## 2022-03-02 DIAGNOSIS — N941 Unspecified dyspareunia: Secondary | ICD-10-CM

## 2022-03-02 DIAGNOSIS — Z803 Family history of malignant neoplasm of breast: Secondary | ICD-10-CM | POA: Diagnosis not present

## 2022-03-02 DIAGNOSIS — N898 Other specified noninflammatory disorders of vagina: Secondary | ICD-10-CM

## 2022-03-02 DIAGNOSIS — C541 Malignant neoplasm of endometrium: Secondary | ICD-10-CM

## 2022-03-02 DIAGNOSIS — Z8049 Family history of malignant neoplasm of other genital organs: Secondary | ICD-10-CM | POA: Insufficient documentation

## 2022-03-02 DIAGNOSIS — B3731 Acute candidiasis of vulva and vagina: Secondary | ICD-10-CM | POA: Diagnosis not present

## 2022-03-02 DIAGNOSIS — Z9221 Personal history of antineoplastic chemotherapy: Secondary | ICD-10-CM | POA: Insufficient documentation

## 2022-03-02 DIAGNOSIS — R600 Localized edema: Secondary | ICD-10-CM

## 2022-03-02 DIAGNOSIS — N761 Subacute and chronic vaginitis: Secondary | ICD-10-CM | POA: Diagnosis not present

## 2022-03-02 LAB — WET PREP, GENITAL
Clue Cells Wet Prep HPF POC: NONE SEEN
Sperm: NONE SEEN
Trich, Wet Prep: NONE SEEN
WBC, Wet Prep HPF POC: <10 (ref <10)
Yeast Wet Prep HPF POC: NONE SEEN

## 2022-03-02 MED ORDER — FUROSEMIDE 20 MG PO TABS
ORAL_TABLET | Freq: Every day | ORAL | 3 refills | Status: AC | PRN
Start: 2022-03-02 — End: 2023-03-02

## 2022-03-02 MED ORDER — FLUCONAZOLE 150 MG PO TABS: 150.0000 mg | ORAL_TABLET | ORAL | 2 refills | Status: DC | PRN

## 2022-03-02 MED ORDER — ESTRADIOL 0.1 MG/GM VA CREA: 1.0000 | TOPICAL_CREAM | VAGINAL | 6 refills | Status: DC

## 2022-03-02 MED ORDER — NYSTATIN 100000 UNIT/GM EX CREA: 1.0000 | TOPICAL_CREAM | Freq: Two times a day (BID) | CUTANEOUS | 3 refills | Status: DC
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
## 2022-03-02 NOTE — Patient Instructions (Addendum)
It was great seeing you today. We will contact you with the results of your biopsy taken from the top of the vagina. We will also let you know about the results of the discharge sample taken. Continue using your dilator with lubrication.  Congratulations on being 5 years out from the completion of cancer treatment.   Plan to follow up in six months or sooner if needed. Please call the office at [phone] for any needs or concerns.   Please let me know if you would like a referral back to pelvic floor physical therapy after you try the exercises yourself at home.  I have refilled your lasix as needed, diflucan, and estrogen vaginal cream.   Symptoms to report to your health care team include vaginal bleeding, rectal bleeding, bloating, weight loss without effort, new and persistent pain, new and  persistent fatigue, new leg swelling, new masses (i.e., bumps in your neck or groin), new and persistent cough, new and persistent nausea and vomiting, change in bowel or bladder habits, and any other concerns.    If you become interested in having a mammogram or colonoscopy given your history, please let me know. If not, continue with your examinations at home.

## 2022-03-03 ENCOUNTER — Telehealth: Payer: Self-pay | Admitting: Surgery

## 2022-03-03 LAB — SURGICAL PATHOLOGY

## 2022-03-03 NOTE — Telephone Encounter (Signed)
LVM for patient to call back regarding test results.

## 2022-03-04 NOTE — Telephone Encounter (Signed)
LVM for patient to call office regarding results.   Per Joylene John NP, (pathology report message) Please let the patient know her vaginal biopsy came back showing chronic inflammation and NO precancer or cancer so good news! The inflammation can be from the atrophy changes from lack of estrogen. Would recommend continuing use of vaginal estrogen and dilator. Has the diflucan helped with the discharge? This is most likely related to the atrophic changes as well.

## 2022-03-06 ENCOUNTER — Telehealth: Payer: Self-pay | Admitting: Gynecologic Oncology

## 2022-03-06 NOTE — Telephone Encounter (Signed)
Called patient to check in. She is doing well and driving to [place] to visit family. Discussed biopsy results with plans to continue to monitor. Advised to call for any needs or concerns.

## 2022-03-27 ENCOUNTER — Encounter: Payer: Self-pay | Admitting: Emergency Medicine

## 2022-03-27 ENCOUNTER — Ambulatory Visit
Admission: EM | Admit: 2022-03-27 | Discharge: 2022-03-27 | Disposition: A | Discharge status: Home / Self Care | Payer: 59 | Attending: Family Medicine | Admitting: Family Medicine

## 2022-03-27 DIAGNOSIS — J358 Other chronic diseases of tonsils and adenoids: Secondary | ICD-10-CM

## 2022-03-27 DIAGNOSIS — J029 Acute pharyngitis, unspecified: Secondary | ICD-10-CM

## 2022-03-27 LAB — POCT RAPID STREP A (OFFICE): Rapid Strep A Screen: NEGATIVE

## 2022-03-27 MED ORDER — PSEUDOEPH-BROMPHEN-DM 30-2-10 MG/5ML PO SYRP: 5.0000 mL | ORAL_SOLUTION | Freq: Four times a day (QID) | ORAL | 0 refills | Status: AC | PRN

## 2022-03-27 NOTE — ED Triage Notes (Signed)
Cough and fever since Tuesday.  Headache, sore throat that continues to get worse.  Has had some nasal drainage.

## 2022-03-27 NOTE — Discharge Instructions (Addendum)
Your Strep Test is negative. You may have an Upper Respiratory Infection with sinus pressure Recommendation is Mucinex D over the counter twice daily as directed We encourage conservative treatment with symptom relief. We encourage you to use Tylenol alternating with Ibuprofen for your fever if not contraindicated. (Remember to use as directed do not exceed daily dosing recommendations) We also encourage salt water gargles for your sore throat. You should also consider throat lozenges and chloraseptic spray.  Your cough can be soothed with a cough suppressant. We have prescribed you a cough suppressant to be taken as  directed

## 2022-03-27 NOTE — ED Provider Notes (Signed)
[facility]    CSN: [id number] Arrival date & time: 03/27/22  5449      History   Chief Complaint No chief complaint on file.   HPI Danielle Guzman is a 57 y.o. female.   HPI  Se is in today complaining of fever, headache, cough, sore throat. She is also having nasal congestion, runny nose. She denies new loss of smell or taste, shortness of breath, chest pain, nausea, or diarrhea. This has been going on for 2 days. Exposure negative COVID/Influenza/Strep. The current treatment has been OTC decongestant, cough syrup, and homeopathic remedies Past Medical History:  Diagnosis Date   Cancer ([place]) 2018   endometrial    Compression fx, lumbar spine ([place])    Compression Fracture T12   Headache    hx migraines yrs ago   Malaria as child   Miscarriage    x 2   Pneumonia yrs ago   Seizures ([place])    petite mal seizure in high school x 1, none since   Sinus infection 08/2017    Patient Active Problem List   Diagnosis Date Noted   Dyspareunia in female 08/25/2019   Vagina, candidiasis 08/26/2018   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 08/26/2018   Vitamin D deficiency 12/25/2017   Physical deconditioning 03/25/2017   Obesity (BMI 30.0-34.9) 03/25/2017   Port catheter in place 12/02/2016   Dysuria 12/02/2016   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   Goals of care, counseling/discussion 11/04/2016   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   Endometrial cancer ([place]) 08/24/2016    Past Surgical History:  Procedure Laterality Date   DENTAL SURGERY     gum graft 20 yrs ago and again on 08/26/16   DILATATION & CURETTAGE/HYSTEROSCOPY WITH MYOSURE N/A 08/11/2016   Procedure: [place];  Surgeon: Jerelyn Charles, MD;  Location: [facility];  Service: Gynecology;  Laterality: N/A;  polyp removal   IR FLUORO GUIDE PORT INSERTION RIGHT  11/10/2016   IR REMOVAL TUN ACCESS W/ PORT W/O FL MOD SED  03/11/2017   IR  US GUIDE VASC ACCESS RIGHT  11/10/2016   LYMPH NODE BIOPSY N/A 09/01/2016   Procedure: SENTINEL LYMPH NODE BIOPSY;  Surgeon: Everitt Amber, MD;  Location: [facility];  Service: Gynecology;  Laterality: N/A;   ROBOTIC ASSISTED TOTAL HYSTERECTOMY WITH BILATERAL SALPINGO OOPHERECTOMY Bilateral 09/01/2016   Procedure: ROBOTIC ASSISTED TOTAL HYSTERECTOMY WITH BILATERAL SALPINGO OOPHORECTOMY;  Surgeon: Everitt Amber, MD;  Location: [facility];  Service: Gynecology;  Laterality: Bilateral;   WISDOM TOOTH EXTRACTION      OB History     Gravida  2   Para      Term      Preterm      AB  2   Living         SAB  2   IAB      Ectopic      Multiple      Live Births               Home Medications    Prior to Admission medications   Medication Sig Start Date End Date Taking? Authorizing Provider  brompheniramine-pseudoephedrine-DM 30-2-10 MG/5ML syrup Take 5 mLs by mouth 4 (four) times daily as needed. cough 03/27/22  Yes Corney Knighton, Diona Foley, NP  APPLE CIDER VINEGAR PO Take 1 Dose by mouth daily. Patient states she takes [place] 1tsp. Daily.    [provider]  Ascorbic Acid (  VITAMIN C PO) Take 1 tablet by mouth 3 (three) times daily as needed (for immune health support or cold symptoms.).    [provider]  b complex vitamins tablet Take 1 tablet by mouth daily.    [provider]  calcium citrate (CALCITRATE - DOSED IN MG ELEMENTAL CALCIUM) 950 MG tablet Take 200 mg of elemental calcium by mouth daily. Patient states she takes 6 pills a day.    [provider]  clobetasol (TEMOVATE) 0.05 % external solution Apply topically 2 (two) times daily as needed. 04/01/21   [provider]  estradiol (ESTRACE VAGINAL) 0.1 MG/GM vaginal cream Insert 1 applicatorful vaginally 2 times a week. 03/02/22   Joylene John D, NP  fluconazole (DIFLUCAN) 150 MG tablet Take 1 tablet (150 mg total) by mouth for vulvar yeast.  May repeat if needed 03/02/22   Joylene John D, NP  furosemide (LASIX) 20 MG tablet TAKE 1 TABLET BY MOUTH DAILY AS NEEDED 03/02/22 03/02/23  Joylene John D, NP  ibuprofen (ADVIL) 200 MG tablet Take 200-400 mg by mouth every 8 (eight) hours as needed.    [provider]  nystatin cream (MYCOSTATIN) Apply to groin 2 times daily as needed for yeast or itching 03/02/22   Cross, Lenna Sciara D, NP  Omega-3 Fatty Acids (FISH OIL) 1000 MG CAPS Take 1 capsule by mouth daily.     [provider]  OVER THE COUNTER MEDICATION Take 1 tablet by mouth daily. Tumeric once a day.    [provider]  OVER THE COUNTER MEDICATION Nitric oxide    [provider]  Pediatric Multiple Vit-Vit C (VITAMIN DAILY PO) Take 10,000 Units by mouth daily.    [provider]  Probiotic Product (PROBIOTIC DAILY PO) Take 1 capsule by mouth daily.    [provider]  Vitamin D, Ergocalciferol, (DRISDOL) 1.25 MG (50000 UNIT) CAPS capsule Take 1 capsule (50,000 Units total) by mouth every 7 (seven) days. 08/25/21   Dorothyann Gibbs, NP    Family History Family History  Problem Relation Age of Onset   Cancer Mother    Breast cancer Mother    Cancer Sister        Cervix   Cancer Maternal Aunt        Breast, uterine   Colon cancer Maternal Aunt    Cancer Paternal Grandmother        Colon   Colon cancer Paternal Grandmother    Esophageal cancer Neg Hx    Pancreatic cancer Neg Hx    Stomach cancer Neg Hx     Social History Social History   Tobacco Use   Smoking status: Former    Packs/day: 1.50    Years: 8.00    Total pack years: 12.00    Types: Cigarettes   Smokeless tobacco: Never   Tobacco comments:    quit smoking at age 68  Vaping Use   Vaping Use: Never used  Substance Use Topics   Alcohol use: Yes    Alcohol/week: 1.0 standard drink of alcohol    Types: 1 Glasses of wine per week    Comment: each night   Drug use: No     Allergies   Latex and Tamiflu  [oseltamivir  phosphate]   Review of Systems Review of Systems   Physical Exam Triage Vital Signs ED Triage Vitals  Enc Vitals Group     BP 03/27/22 0856 127/81     Pulse Rate 03/27/22 0856 95  Resp 03/27/22 0856 16     Temp 03/27/22 0856 99 F (37.2 C)     Temp Source 03/27/22 0856 Oral     SpO2 03/27/22 0856 95 %     Weight --      Height --      Head Circumference --      Peak Flow --      Pain Score 03/27/22 0857 6     Pain Loc --      Pain Edu? --      Excl. in [place]? --    No data found.  Updated Vital Signs BP 127/81 (BP Location: Right Arm)   Pulse 95   Temp 99 F (37.2 C) (Oral)   Resp 16   SpO2 95%   Visual Acuity Right Eye Distance:   Left Eye Distance:   Bilateral Distance:    Right Eye Near:   Left Eye Near:    Bilateral Near:     Physical Exam Constitutional:      Appearance: She is obese.  HENT:     Head: Normocephalic and atraumatic.     Right Ear: Tympanic membrane normal.     Left Ear: Tympanic membrane normal.     Nose: Nose normal.     Mouth/Throat:     Mouth: Mucous membranes are moist.     Comments: Tonsil stones to right upper Eyes:     Pupils: Pupils are equal, round, and reactive to light.  Cardiovascular:     Rate and Rhythm: Normal rate and regular rhythm.     Pulses: Normal pulses.  Pulmonary:     Effort: Pulmonary effort is normal.     Comments: Diminished  Musculoskeletal:        General: Normal range of motion.     Cervical back: Normal range of motion.  Skin:    General: Skin is warm and dry.     Capillary Refill: Capillary refill takes less than 2 seconds.  Neurological:     General: No focal deficit present.     Mental Status: She is alert and oriented to person, place, and time.  Psychiatric:        Mood and Affect: Mood normal.      UC Treatments / Results  Labs (all labs ordered are listed, but only abnormal results are displayed) Labs Reviewed  POCT RAPID STREP A (OFFICE)    EKG   Radiology No results  found.  Procedures Procedures (including critical care time)  Medications Ordered in UC Medications - No data to display  Initial Impression / Assessment and Plan / UC Course  I have reviewed the triage vital signs and the nursing notes.  Pertinent labs & imaging results that were available during my care of the patient were reviewed by me and considered in my medical decision making (see chart for details).     Sore throat cough Final Clinical Impressions(s) / UC Diagnoses   Final diagnoses:  Tonsil stone  Acute pharyngitis, unspecified etiology     Discharge Instructions      Your Strep Test is negative. You may have an Upper Respiratory Infection with sinus pressure Recommendation is Mucinex D over the counter twice daily as directed We encourage conservative treatment with symptom relief. We encourage you to use Tylenol alternating with Ibuprofen for your fever if not contraindicated. (Remember to use as directed do not exceed daily dosing recommendations) We also encourage salt water gargles for your sore throat. You should also consider  throat lozenges and chloraseptic spray.  Your cough can be soothed with a cough suppressant. We have prescribed you a cough suppressant to be taken as  directed       ED Prescriptions     Medication Sig Dispense Auth. Provider   brompheniramine-pseudoephedrine-DM 30-2-10 MG/5ML syrup Take 5 mLs by mouth 4 (four) times daily as needed. cough 120 mL Vevelyn Francois, NP      PDMP not reviewed this encounter.   Dionisio David [place], [place] 03/27/22 [phone]

## 2022-08-27 ENCOUNTER — Encounter: Payer: Self-pay | Admitting: Gynecologic Oncology

## 2022-08-31 ENCOUNTER — Inpatient Hospital Stay: Payer: 59 | Attending: Gynecologic Oncology | Admitting: Gynecologic Oncology

## 2022-08-31 ENCOUNTER — Other Ambulatory Visit: Payer: Self-pay

## 2022-08-31 ENCOUNTER — Inpatient Hospital Stay: Payer: 59

## 2022-08-31 VITALS — BP 139/94 | HR 73 | Temp 97.7°F | Ht 64.69 in | Wt 221.4 lb

## 2022-08-31 DIAGNOSIS — Z1501 Genetic susceptibility to malignant neoplasm of breast: Secondary | ICD-10-CM | POA: Insufficient documentation

## 2022-08-31 DIAGNOSIS — Z803 Family history of malignant neoplasm of breast: Secondary | ICD-10-CM | POA: Insufficient documentation

## 2022-08-31 DIAGNOSIS — Z800 Family history of malignant neoplasm of digestive organs: Secondary | ICD-10-CM | POA: Diagnosis not present

## 2022-08-31 DIAGNOSIS — B3731 Acute candidiasis of vulva and vagina: Secondary | ICD-10-CM | POA: Diagnosis not present

## 2022-08-31 DIAGNOSIS — Z1509 Genetic susceptibility to other malignant neoplasm: Secondary | ICD-10-CM | POA: Insufficient documentation

## 2022-08-31 DIAGNOSIS — E559 Vitamin D deficiency, unspecified: Secondary | ICD-10-CM

## 2022-08-31 DIAGNOSIS — Z148 Genetic carrier of other disease: Secondary | ICD-10-CM | POA: Diagnosis not present

## 2022-08-31 DIAGNOSIS — Z90722 Acquired absence of ovaries, bilateral: Secondary | ICD-10-CM | POA: Diagnosis not present

## 2022-08-31 DIAGNOSIS — Z8542 Personal history of malignant neoplasm of other parts of uterus: Secondary | ICD-10-CM | POA: Diagnosis not present

## 2022-08-31 DIAGNOSIS — Z90710 Acquired absence of both cervix and uterus: Secondary | ICD-10-CM | POA: Insufficient documentation

## 2022-08-31 DIAGNOSIS — Z9221 Personal history of antineoplastic chemotherapy: Secondary | ICD-10-CM | POA: Insufficient documentation

## 2022-08-31 DIAGNOSIS — C541 Malignant neoplasm of endometrium: Secondary | ICD-10-CM

## 2022-08-31 DIAGNOSIS — Z8049 Family history of malignant neoplasm of other genital organs: Secondary | ICD-10-CM | POA: Diagnosis not present

## 2022-08-31 DIAGNOSIS — Z1502 Genetic susceptibility to malignant neoplasm of ovary: Secondary | ICD-10-CM | POA: Diagnosis not present

## 2022-08-31 DIAGNOSIS — N941 Unspecified dyspareunia: Secondary | ICD-10-CM

## 2022-08-31 LAB — CBC (CANCER CENTER ONLY)
HCT: 41.4 % (ref 36.0–46.0)
Hemoglobin: 14 g/dL (ref 12.0–15.0)
MCH: 27.9 pg (ref 26.0–34.0)
MCHC: 33.8 g/dL (ref 30.0–36.0)
MCV: 82.5 fL (ref 80.0–100.0)
Platelet Count: 233 10*3/uL (ref 150–400)
RBC: 5.02 MIL/uL (ref 3.87–5.11)
RDW: 13.6 % (ref 11.5–15.5)
WBC Count: 7.3 10*3/uL (ref 4.0–10.5)
nRBC: 0 % (ref 0.0–0.2)

## 2022-08-31 LAB — COMPREHENSIVE METABOLIC PANEL
ALT: 30 U/L (ref 0–44)
AST: 21 U/L (ref 15–41)
Albumin: 4.1 g/dL (ref 3.5–5.0)
Alkaline Phosphatase: 98 U/L (ref 38–126)
Anion gap: 8 (ref 5–15)
BUN: 12 mg/dL (ref 6–20)
CO2: 27 mmol/L (ref 22–32)
Calcium: 9.4 mg/dL (ref 8.9–10.3)
Chloride: 101 mmol/L (ref 98–111)
Creatinine, Ser: 0.72 mg/dL (ref 0.44–1.00)
GFR, Estimated: >60 mL/min (ref >60)
Glucose, Bld: 93 mg/dL (ref 70–99)
Potassium: 4.1 mmol/L (ref 3.5–5.1)
Sodium: 136 mmol/L (ref 135–145)
Total Bilirubin: 0.6 mg/dL (ref 0.3–1.2)
Total Protein: 6 g/dL — ABNORMAL LOW (ref 6.5–8.1)

## 2022-08-31 LAB — VITAMIN D 25 HYDROXY (VIT D DEFICIENCY, FRACTURES): Vit D, 25-Hydroxy: 20.44 ng/mL — ABNORMAL LOW (ref 30–100)

## 2022-08-31 MED ORDER — NYSTATIN 100000 UNIT/GM EX CREA: 1.0000 | TOPICAL_CREAM | Freq: Two times a day (BID) | CUTANEOUS | 3 refills | Status: DC

## 2022-08-31 MED ORDER — ESTRADIOL 0.1 MG/GM VA CREA: 1.0000 | TOPICAL_CREAM | VAGINAL | 6 refills | Status: DC

## 2022-08-31 MED ORDER — FLUCONAZOLE 150 MG PO TABS: 150.0000 mg | ORAL_TABLET | ORAL | 2 refills | Status: DC | PRN

## 2022-08-31 NOTE — Patient Instructions (Addendum)
It was great seeing you today. No concerning findings on today's exam.   We will let you know the results of your lab work from today.    I would recommend trying to take baby steps with changing your daily routine to see if this helps stimulate weight loss.    Plan to follow up in one year or sooner if needed. Please call the office at [phone] in Feb or March of 2025 to schedule an appointment for June 2025 and please call for any needs or concerns or new symptoms.    Symptoms to report to your health care team include vaginal bleeding, rectal bleeding, bloating, weight loss without effort, new and persistent pain, new and  persistent fatigue, new leg swelling, new masses (i.e., bumps in your neck or groin), new and persistent cough, new and persistent nausea and vomiting, change in bowel or bladder habits, and any other concerns.

## 2022-08-31 NOTE — Progress Notes (Signed)
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  She has had difficulty sleeping at night.  States that she may get 5 hours of sleep but wakes up and does not feel rested.  She reports tolerating her diet with no nausea or emesis.  She does eat later in the evening between 9-10.  Bleeding is light she tries to stay active around reflux symptoms.  She does report an odd feeling sometimes with eating or with drinking water similar to feelings possibly of reflux.  Vaginal bleeding or discharge reported continues to report painful intercourse.  She continues to have bladder incontinence.  She was seen by pelvic floor physical therapy for this in the past.  Bowels are functioning without difficulty with no bleeding reported.  She can have 2-3 bowel movements a day they are normal for her.  Lower extremity edema is still present and intermittent.  No redness or pain in the calves.  When discussing stretching out appointments to yearly she states that his makes her feel uneasy and like to continue with 76-month appointments.  She denies chest pain shortness of breath cough, headache vision changes hearing changes abdominal bloating.  She continues to have intermittent pelvic discomfort that seems to be more on the right but radiates to the left as well.  It has not changed from.  Abdominal incisions are healing well without nodularity.  She keeps a close eye on her breast.  She does report having a recent shingles infection that started on her right breast.  [place] was [place] and started having severe pain in the right breast ended up having shingles.  She is at the shingles in the past before on in that same area.  She does not want to check a hemoglobin A1c or cholesterol panel today she does not feel like this is necessary.  She is willing to have a CBC, c-Met and vitamin D level checked today.

## 2022-09-01 ENCOUNTER — Telehealth: Payer: Self-pay | Admitting: Gynecologic Oncology

## 2022-09-01 ENCOUNTER — Ambulatory Visit: Payer: 59 | Admitting: Gynecologic Oncology

## 2022-09-01 DIAGNOSIS — E559 Vitamin D deficiency, unspecified: Secondary | ICD-10-CM

## 2022-09-01 MED ORDER — VITAMIN D (ERGOCALCIFEROL) 1.25 MG (50000 UNIT) PO CAPS: 50000.0000 [IU] | ORAL_CAPSULE | ORAL | 0 refills | Status: DC

## 2022-09-01 NOTE — Telephone Encounter (Signed)
Attempted to call patient to discuss recent lab results.  Left message asking her to please call the office back to discuss.  Her recent CBC is normal.  Her recent metabolic panel is normal except for a slightly low total protein level of 6.0.  Her vitamin D level is 20.44 with this previously being 13.03 around 1 year ago.   Plan will be for vitamin D supplementation.  Patient can also be advised to monitor her protein intake and try to incorporate more protein rich foods. Albumin of note is 4.1.

## 2022-09-03 ENCOUNTER — Telehealth: Payer: Self-pay | Admitting: Oncology

## 2022-09-03 ENCOUNTER — Telehealth: Payer: Self-pay | Admitting: *Deleted

## 2022-09-03 NOTE — Telephone Encounter (Signed)
Attempted to reach patient in regards to recent lab results relayed from Warner Mccreedy, NP. Left message requesting return call [phone].

## 2022-09-03 NOTE — Telephone Encounter (Signed)
Danielle Guzman called back regarding her lab results.  Advised her that Her recent CBC is normal.  Her recent metabolic panel is normal except for a slightly low total protein level of 6.0.  Her vitamin D level is 20.44 with this previously being 13.03 around 1 year ago.    Plan will be for vitamin D supplementation.  Patient can also be advised to monitor her protein intake and try to incorporate more protein rich foods. Danielle Guzman verbalized understanding and asked if she could reschedule her appointment in December.

## 2022-09-04 ENCOUNTER — Telehealth: Payer: Self-pay | Admitting: *Deleted

## 2022-09-04 NOTE — Telephone Encounter (Signed)
LMOM for the patient to call the office back. Patient wanted to change her December appt

## 2022-09-09 NOTE — Telephone Encounter (Signed)
LMOM for the patient to call the office back. Patient wanted to change her December appt   

## 2022-09-15 NOTE — Telephone Encounter (Signed)
LMOM for the patient to call the office back. Patient wanted to change her December appt  

## 2022-10-08 ENCOUNTER — Telehealth: Payer: Self-pay | Admitting: *Deleted

## 2022-10-08 NOTE — Telephone Encounter (Signed)
Spoke with Ms. Danielle Guzman who called the office to reschedule her appt. With Warner Mccreedy, NP from Tuesday, December 17th. To Monday, December 9th at 3 pm. Pt agreed to date and time. Instructed to arrive at 2:45 for check in.

## 2023-02-22 ENCOUNTER — Inpatient Hospital Stay (HOSPITAL_BASED_OUTPATIENT_CLINIC_OR_DEPARTMENT_OTHER): Payer: 59 | Admitting: Gynecologic Oncology

## 2023-02-22 ENCOUNTER — Inpatient Hospital Stay: Payer: 59 | Attending: Gynecologic Oncology

## 2023-02-22 ENCOUNTER — Ambulatory Visit: Payer: 59 | Admitting: Gynecologic Oncology

## 2023-02-22 VITALS — BP 118/72 | HR 70 | Temp 97.7°F | Resp 20 | Wt 227.8 lb

## 2023-02-22 DIAGNOSIS — N941 Unspecified dyspareunia: Secondary | ICD-10-CM

## 2023-02-22 DIAGNOSIS — Z8542 Personal history of malignant neoplasm of other parts of uterus: Secondary | ICD-10-CM | POA: Diagnosis not present

## 2023-02-22 DIAGNOSIS — B3731 Acute candidiasis of vulva and vagina: Secondary | ICD-10-CM | POA: Diagnosis not present

## 2023-02-22 DIAGNOSIS — Z08 Encounter for follow-up examination after completed treatment for malignant neoplasm: Secondary | ICD-10-CM | POA: Diagnosis not present

## 2023-02-22 DIAGNOSIS — Z9221 Personal history of antineoplastic chemotherapy: Secondary | ICD-10-CM | POA: Insufficient documentation

## 2023-02-22 DIAGNOSIS — Z90710 Acquired absence of both cervix and uterus: Secondary | ICD-10-CM | POA: Insufficient documentation

## 2023-02-22 DIAGNOSIS — C541 Malignant neoplasm of endometrium: Secondary | ICD-10-CM

## 2023-02-22 DIAGNOSIS — E559 Vitamin D deficiency, unspecified: Secondary | ICD-10-CM

## 2023-02-22 DIAGNOSIS — Z90722 Acquired absence of ovaries, bilateral: Secondary | ICD-10-CM | POA: Diagnosis not present

## 2023-02-22 LAB — VITAMIN D 25 HYDROXY (VIT D DEFICIENCY, FRACTURES): Vit D, 25-Hydroxy: 25.18 ng/mL — ABNORMAL LOW (ref 30–100)

## 2023-02-22 MED ORDER — ESTRADIOL 0.1 MG/GM VA CREA: 1.0000 | TOPICAL_CREAM | VAGINAL | 3 refills | Status: DC

## 2023-02-22 MED ORDER — FLUCONAZOLE 150 MG PO TABS: 150.0000 mg | ORAL_TABLET | ORAL | 2 refills | Status: DC | PRN

## 2023-02-22 MED ORDER — NYSTATIN 100000 UNIT/GM EX CREA: 1.0000 | TOPICAL_CREAM | Freq: Two times a day (BID) | CUTANEOUS | 3 refills | Status: DC

## 2023-02-22 NOTE — Progress Notes (Unsigned)
Follow-up Note: Gyn Oncology  CC:  Chief Complaint  Patient presents with   Endometrial cancer [facility])   Assessment/Plan: Danielle Guzman  is a 57 y.o. 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 Adolphus Birchwood. 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 She completed adjuvant carboplatin and paclitaxel x 6 cycles between 11/12/2016 - 02/23/17. Declined genetics consultation despite loss of MSH6 on IHC and strong family history of cancer including a confirmed diagnosis of Lynch syndrome in her mother's twin sister.   No findings concerning for recurrence on today's examination. She is advised to follow up in six months or sooner if any needs or symptoms arise. Reportable signs and symptoms reviewed. She will be contacted with the results of her lab work (Vit D) from today. CBC and Bmet checked in June 2024 with no interventions needed. She does not want to undergo mammography for further workup on breast findings on today's exam but she is willing to consider breast thermography. Advised our office would look into available facilities. She will also call with information if she finds an office she would like to go with. Advised this is not standard recommendations/guidelines for breast screening/workup. Verbalizing understanding. She is hopeful she can decrease alcohol intake and focus more on her health in the new year. Requested refills sent in.  HPI: Danielle Guzman is a 58 year old [id number] initially seen in consultation at the request of Dr Aldona Bar and Dr Marlow Baars for newly diagnosed endometrial cancer.  She underwent early menopause at age 28 with all of her female relatives experiencing early menopause as well. She developed postmenopausal bleeding in March 2018 and sought care with Dr Aldona Bar on 05/2016. A TVUS showed an endometrium of 1.8 cm with a 3 cm hypervascular polyp. On 08/11/16, she underwent a  hysteroscopy/D&C with Dr. Marlow Baars. Final pathology revealed endometrial adenocarcinoma with the majority of the specimen revealing grade 2 endometrioid endometrial cancer with a small focus (5%) of clear cell carcinoma and a few foci suspicious for serous carcinoma.  Her family history includes her mother with a history of breast cancer x 3 and a sister with a history of cervical cancer. Her maternal aunt has a history of breast cancer. A different maternal aunt (her mother's twin sister) was diagnosed with Lynch Syndrome after having uterine and colon cancers. Her maternal cousin had uterine cancer. Her paternal grandmother had a diagnosis of colon cancer.  On 09/01/16, she underwent robotic assisted total hysterectomy, BSO, SLN biopsy with Dr. Adolphus Birchwood. Surgery was uncomplicated. The final pathology revealed a 1.8 cm grade 2 endometrioid endometrial cancer (with rare foci of squamous differentiation and a rare microscopic focus of serous differentiation). There was luminal foci of carcinoma within the right and left fallopian tubes and the right fallopian tube lumen involvement showed a focus of similar appearing chondroid stroma with adjacent focus of serous carcinoma. The lymph nodes and cervix were negative for carcinoma and the uterine tumor showed no myometrial invasion and no LVSI. Final pathology revealed loss of MSH6 on IHC.   She declined genetics consultation despite counseling that she may have Lynch syndrome which increases her risk for other cancers (that might be detected early or prevented with more frequent screenings that can be facilitated with this diagnosis).  Postoperatively, she completed 6 cycles of carboplatin and paclitaxel between 11/12/17-12/11/9 after experiencing some trepidation regarding toxicity overwhelming benefits and this concern  delayed initiation of adjuvant therapy.   She has a history of back and abdominal pain, which prompted a CT abd/pelvis on February 28, 2018.  This showed mild superior endplate compression fracture deformity of T12 which was new from 2018, no retropulsion.  There was no evidence of recurrent or metastatic disease. She was seen by a chiropractor for this and feels that this is improved her symptoms substantially.  CT abd/pelvis in December, 2020 was ordered due to pelvic pain symptoms. This was negative for evidence of recurrence. In the Fall of 2022, she reported lower pelvic pain for the past three months, increasing since the end of October 2022. A CT scan of the abdomen and pelvis was obtained on 02/2021 to rule out recurrence. Findings were negative for lymphadenopathy or metastatic disease with stable tiny pulmonary nodules.   Due to an erythematous lesion noted at the vaginal cuff in 02/2021, she underwent a biopsy which returned as squamous mucosa with inflammation and reactive changes with no evidence of malignancy. A new lesion at the vaginal cuff was biopsied on 03/02/2022 returning chronic inflammation.   Interval Hx: Patient presents today to the office for continued follow-up.  She states for the past several months she has been extremely stressed.  She continues to work 3 jobs but will be turning in her notice for two soon.  She feels like this would be a good step for her.  She has been tolerating her diet with no nausea or emesis and no appetite changes.  She has been drinking alcohol more.  She drinks wine to help her sleep. Chronic lower pelvic pain, unchanged. Recently had friend pass and feels her left upper abd pain is related this this. No abnormal bloating. Her bladder habits are at baseline and she feels she needs to do more pelvic floor exercises. Intercourse is still painful. Finds other ways for intimacy. Uses estrogen cream intermittently. Her bowels are functioning, more loose with increased alcohol intake. Her lower extremity edema remains and is unchanged. No vaginal bleeding or discharge.   She does frequent  breast exams. In regards to the nodule noted in the right breast, pt thinks may have been there x 1 month. Her mother had breast cancer and she states her mother found her cancers herself x 3 on exams. She does not want to pursue mammography. She reports intermittent itching with the right breast skin lesion that she saw dermatology for previously. She is planning for follow up with her dermatologist to check the new area under her left eye, the seborrheic keratosis on right shoulder, and the lesion to left of right nipple. She continues to use nystatin cream intermittently for groin/vulva candidiasis and also uses diflucan if needed. Refills have been requested on both medications. Of note, she drinks caffeine throughout the day (makes large amount of black tea), tries to drink large amount of water, and wine at bedtime.   Review of Systems: See interval.  Constitutional: Feels well but stressed at times. Appetite is good. No early satiety. She has had some weight gain since last visit.  Cardiovascular: No chest pain, shortness of breath, or edema.  Pulmonary: No cough or wheeze.  Gastrointestinal: No nausea, vomiting, or diarrhea. No bright red blood per rectum or change in bowel movement.  Genitourinary: Intermittent bladder leakage-minimal. No frequency, urgency, or dysuria. Dyspareunia.   Musculoskeletal: Intermittent pubic pain that is unchanged. Neurologic: No weakness, numbness, or change in gait.  Psychology: She has been stressed over the past  several months. Intermittent insomnia.  Skin: Skin lesion on the right breast at the nipple, has been present/unchanged, intermittent itching. New skin changes under left eye, seborrheic keratosis on right shoulder  Health Maintenance: Mammogram: Declines to have Colonoscopy: Declines to have, saw GI in the past for anal lesion, states GI said to call prn Declines PCP referral  Current Meds:  Outpatient Encounter Medications as of 02/22/2023   Medication Sig   APPLE CIDER VINEGAR PO Take 1 Dose by mouth daily. Patient states she takes Web designer 1tsp. Daily.   Ascorbic Acid (VITAMIN C PO) Take 1 tablet by mouth 3 (three) times daily as needed (for immune health support or cold symptoms.).   b complex vitamins tablet Take 1 tablet by mouth daily.   brompheniramine-pseudoephedrine-DM 30-2-10 MG/5ML syrup Take 5 mLs by mouth 4 (four) times daily as needed. cough   calcium citrate (CALCITRATE - DOSED IN MG ELEMENTAL CALCIUM) 950 MG tablet Take 200 mg of elemental calcium by mouth daily. Patient states she takes 6 pills a day.   clobetasol (TEMOVATE) 0.05 % external solution Apply topically 2 (two) times daily as needed.   furosemide (LASIX) 20 MG tablet TAKE 1 TABLET BY MOUTH DAILY AS NEEDED   ibuprofen (ADVIL) 200 MG tablet Take 200-400 mg by mouth every 8 (eight) hours as needed.   Omega-3 Fatty Acids (FISH OIL) 1000 MG CAPS Take 1 capsule by mouth daily.    OVER THE COUNTER MEDICATION Take 1 tablet by mouth daily. Tumeric once a day.   OVER THE COUNTER MEDICATION Nitric oxide   Pediatric Multiple Vit-Vit C (VITAMIN DAILY PO) Take 10,000 Units by mouth daily.   Probiotic Product (PROBIOTIC DAILY PO) Take 1 capsule by mouth daily.   Vitamin D, Ergocalciferol, (DRISDOL) 1.25 MG (50000 UNIT) CAPS capsule Take 1 capsule (50,000 Units total) by mouth every 7 (seven) days.   [DISCONTINUED] estradiol (ESTRACE VAGINAL) 0.1 MG/GM vaginal cream Insert 1 applicatorful vaginally 2 times a week.   [DISCONTINUED] fluconazole (DIFLUCAN) 150 MG tablet Take 1 tablet (150 mg total) by mouth for vulvar yeast.  May repeat if needed   [DISCONTINUED] nystatin cream (MYCOSTATIN) Apply to groin 2 times daily as needed for yeast or itching   estradiol (ESTRACE VAGINAL) 0.1 MG/GM vaginal cream Insert 1 applicatorful vaginally 2 times a week.   fluconazole (DIFLUCAN) 150 MG tablet Take 1 tablet (150 mg total) by mouth for vulvar yeast.  May repeat if  needed   nystatin cream (MYCOSTATIN) Apply to groin 2 times daily as needed for yeast or itching   No facility-administered encounter medications on file as of 02/22/2023.    Allergy:  Allergies  Allergen Reactions   Latex     Long exposure causes a rash   Tamiflu  [Oseltamivir Phosphate] Nausea Only    Social Hx:   Social History   Socioeconomic History   Marital status: Married    Spouse name: Dorinda Hill "Donnie"   Number of children: 0   Years of education: Not on file   Highest education level: Not on file  Occupational History   Occupation: massage therapist  Tobacco Use   Smoking status: Former    Current packs/day: 1.50    Average packs/day: 1.5 packs/day for 8.0 years (12.0 ttl pk-yrs)    Types: Cigarettes   Smokeless tobacco: Never   Tobacco comments:    quit smoking at age 78  Vaping Use   Vaping status: Never Used  Substance and Sexual Activity  Alcohol use: Yes    Alcohol/week: 1.0 standard drink of alcohol    Types: 1 Glasses of wine per week    Comment: each night   Drug use: No   Sexual activity: Yes    Birth control/protection: Post-menopausal  Other Topics Concern   Not on file  Social History Narrative   Not on file   Social Determinants of Health   Financial Resource Strain: Not on file  Food Insecurity: Not on file  Transportation Needs: Not on file  Physical Activity: Not on file  Stress: Not on file  Social Connections: Not on file  Intimate Partner Violence: Not on file    Past Surgical Hx:  Past Surgical History:  Procedure Laterality Date   DENTAL SURGERY     gum graft 20 yrs ago and again on 08/26/16   DILATATION & CURETTAGE/HYSTEROSCOPY WITH MYOSURE N/A 08/11/2016   Procedure: DILATATION & CURETTAGE/HYSTEROSCOPY WITH MYOSURE;  Surgeon: Marlow Baars, MD;  Location: [facility];  Service: Gynecology;  Laterality: N/A;  polyp removal   IR FLUORO GUIDE PORT INSERTION RIGHT  11/10/2016   IR REMOVAL TUN ACCESS W/ PORT W/O FL MOD SED   03/11/2017   IR US GUIDE VASC ACCESS RIGHT  11/10/2016   LYMPH NODE BIOPSY N/A 09/01/2016   Procedure: SENTINEL LYMPH NODE BIOPSY;  Surgeon: Adolphus Birchwood, MD;  Location: [facility];  Service: Gynecology;  Laterality: N/A;   ROBOTIC ASSISTED TOTAL HYSTERECTOMY WITH BILATERAL SALPINGO OOPHERECTOMY Bilateral 09/01/2016   Procedure: ROBOTIC ASSISTED TOTAL HYSTERECTOMY WITH BILATERAL SALPINGO OOPHORECTOMY;  Surgeon: Adolphus Birchwood, MD;  Location: [facility];  Service: Gynecology;  Laterality: Bilateral;   WISDOM TOOTH EXTRACTION      Past Medical Hx:  Past Medical History:  Diagnosis Date   Cancer (HCC) 2018   endometrial    Compression fx, lumbar spine (HCC)    Compression Fracture T12   Headache    hx migraines yrs ago   Malaria as child   Miscarriage    x 2   Pneumonia yrs ago   Seizures (HCC)    petite mal seizure in high school x 1, none since   Sinus infection 08/2017    Past Gynecological History:  Premature menopause No LMP recorded. Patient has had a hysterectomy.  Family Hx:  Family History  Problem Relation Age of Onset   Cancer Mother    Breast cancer Mother    Cancer Sister        Cervix   Cancer Maternal Aunt        Breast, uterine   Colon cancer Maternal Aunt    Cancer Paternal Grandmother        Colon   Colon cancer Paternal Grandmother    Esophageal cancer Neg Hx    Pancreatic cancer Neg Hx    Stomach cancer Neg Hx     Vitals:  Blood pressure 118/72, pulse 70, temperature 97.7 F (36.5 C), temperature source Oral, resp. rate 20, weight 227 lb 12.8 oz (103.3 kg), SpO2 100%.  Physical Exam: General: Well developed, well nourished female in no acute distress. Alert and oriented x 3.  Neck: Supple without any enlargements.  Lymph node survey: No cervical, supraclavicular, or inguinal adenopathy.  Cardiovascular: Regular rate and rhythm. S1 and S2 normal.  Lungs: Clear to auscultation bilaterally. No wheezes/crackles/rhonchi noted.  Skin: Skin lesion noted on the right  breast at 3 o clock at the nipple. Similar in appearance to a seborrheic keratosis and unchanged from previous exam. Seborrheic keratosis  noted on right shoulder. Small area of erythema with dry skin under left eye. Back: No CVA tenderness.  Breasts: Inspection negative with no erythema or discharge noted bilaterally on exam. No skin changes. Nodular tissue noted at 8 o clock on right breast in the outer quadrant, mobile on palpation, mild tenderness with palpation.  Abdomen: Abdomen soft, non-tender and obese. Active bowel sounds in all quadrants. No evidence of a fluid wave or abdominal masses. No nodularity noted at healed laparoscopic sites.  Genitourinary:    Vulva/vagina: Normal external female genitalia. No lesions. Mild amount of candidiasis between inner labial folds.    Urethra: No lesions or masses.    Vagina: Vagina atrophic. No bleeding or discharge noted. No vaginal lesions noted. No palpable masses or nodularity.  Rectal: Good tone, no masses, no cul de sac nodularity.  Extremities: Bilateral lower extremity edema, non pitting, equal. No bilateral cyanosis or clubbing.     Doylene Bode, NP  02/23/2023, 10:41 AM

## 2023-02-22 NOTE — Patient Instructions (Addendum)
It was great seeing you today. No concerning findings on today's exam. I will look into the thermal scan to further evaluate the area in the right breast. We will contact you with further info on this.   Please call after the new year if the upper left abdominal discomfort remains.  We will plan on checking a vitamin D level today and will contact you with the results.    Plan to follow up in six months or sooner if needed. Please call for any needs or concerns or new symptoms.    Symptoms to report to your health care team include vaginal bleeding, rectal bleeding, bloating, weight loss without effort, new and persistent pain, new and  persistent fatigue, new leg swelling, new masses (i.e., bumps in your neck or groin), new and persistent cough, new and persistent nausea and vomiting, change in bowel or bladder habits, and any other concerns.

## 2023-02-23 ENCOUNTER — Telehealth: Payer: Self-pay | Admitting: *Deleted

## 2023-02-23 NOTE — Telephone Encounter (Signed)
Spoke with patient and relayed message below from Warner Mccreedy, NP that her Vit D level is at 25.18. Advised patient to continue taking Vitamin D 10,000 international units per day. Information given below related to Thermascan. Handouts will be mailed to patient. Pt verbalized understanding and thanked the office for calling.

## 2023-02-23 NOTE — Telephone Encounter (Signed)
-----   Message from Doylene Bode sent at 02/23/2023  9:57 AM EST ----- Please call the patient and let her know her Vitamin D level is at 25.18. In June of this year, it was 20.44. Heading in the right direction slowly. Please make sure she is taking daily vitamin d and confirm dosing and update med list.   Also please let her know I looked into thermascan for the breasts. There is the [facility] and [address] in [place]. At [facility], it looks like the cost is around $325 (there may be other costs as well). [facility] is saying cost around $260. They may recommend or require medical consultation based on the results which says can be an extra $192. She can research both on their websites and let us know what she would like to do. Would recommend imaging based on her family history, breast exam findings from visit.   Please mail the handouts to her as well.

## 2023-03-02 ENCOUNTER — Ambulatory Visit: Payer: 59 | Admitting: Gynecologic Oncology

## 2023-03-25 ENCOUNTER — Telehealth: Payer: Self-pay | Admitting: *Deleted

## 2023-03-25 NOTE — Telephone Encounter (Signed)
 Left a message for the patient to see if she was able to arrange for a breast Thermoscan and asked her to call the office to let us know

## 2023-03-26 ENCOUNTER — Telehealth: Payer: Self-pay | Admitting: *Deleted

## 2023-03-26 NOTE — Telephone Encounter (Signed)
 2nd attempt to reach patient in regards to her follow up with breast thermoscan. Left voicemail requesting call back to [phone].

## 2023-03-26 NOTE — Telephone Encounter (Signed)
-----   Message from Nurse Dawna HERO sent at 03/25/2023  2:55 PM EST ----- I called and left her a message ----- Message ----- From: Micheline Eleanor BIRCH, NP Sent: 03/25/2023  10:57 AM EST To: Ami LITTIE Fess, RN; Dawna HERO Flies, RN  I saw this patient recently. Please reach out to follow up if she has scheduled the breast thermoscan and what her plans are for evaluating the area felt in the right breast felt on exam. Do we need to help with arranging things etc? Thank you!

## 2023-03-29 NOTE — Telephone Encounter (Signed)
 Spoke with Danielle Guzman who states she is waiting to speak with [name] tomorrow ( She has called them and waiting for the right person to call her back) in hopes they have an office in [place] with a possible appt. Next week. Pt states she still feels the area in the right breast and is checking it daily. Advised patient to call the office back once she has an appointment or with any help she may need for follow up in regards to area in the right breast. Pt thanked the office for calling.

## 2023-03-29 NOTE — Telephone Encounter (Signed)
-----   Message from Nurse Dawna HERO sent at 03/25/2023  2:55 PM EST ----- I called and left her a message ----- Message ----- From: Micheline Eleanor BIRCH, NP Sent: 03/25/2023  10:57 AM EST To: Ami LITTIE Fess, RN; Dawna HERO Flies, RN  I saw this patient recently. Please reach out to follow up if she has scheduled the breast thermoscan and what her plans are for evaluating the area felt in the right breast felt on exam. Do we need to help with arranging things etc? Thank you!

## 2023-04-02 ENCOUNTER — Telehealth: Payer: Self-pay | Admitting: *Deleted

## 2023-04-02 NOTE — Telephone Encounter (Signed)
Spoke with Danielle Guzman who called the office to let us know that she is scheduled for a Breast Thermoscan on Tuesday, 1/21 with [facility] in [place]. Pt states they will read and send report and pt asked for office email for them to forward results. Office email given to patient.  Pt is also asking for letter of recommendation for Thermoscan from Warner Mccreedy, NP. Pt states she could come by the office and pick this up.  Advised patient her message would be relayed to provider.

## 2023-04-05 ENCOUNTER — Ambulatory Visit
Admission: EM | Admit: 2023-04-05 | Discharge: 2023-04-05 | Disposition: A | Discharge status: Home / Self Care | Payer: 59 | Attending: Family Medicine | Admitting: Family Medicine

## 2023-04-05 DIAGNOSIS — J101 Influenza due to other identified influenza virus with other respiratory manifestations: Secondary | ICD-10-CM | POA: Diagnosis not present

## 2023-04-05 DIAGNOSIS — R509 Fever, unspecified: Secondary | ICD-10-CM

## 2023-04-05 DIAGNOSIS — R000 Tachycardia, unspecified: Secondary | ICD-10-CM | POA: Diagnosis not present

## 2023-04-05 LAB — POC COVID19/FLU A&B COMBO
Covid Antigen, POC: NEGATIVE — AB
Influenza A Antigen, POC: POSITIVE — AB
Influenza B Antigen, POC: NEGATIVE

## 2023-04-05 MED ORDER — GUAIFENESIN ER 600 MG PO TB12: 600.0000 mg | ORAL_TABLET | Freq: Two times a day (BID) | ORAL | 0 refills | Status: AC

## 2023-04-05 MED ORDER — FLUTICASONE PROPIONATE 50 MCG/ACT NA SUSP: 1.0000 | Freq: Two times a day (BID) | NASAL | 2 refills | Status: AC

## 2023-04-05 MED ORDER — PROMETHAZINE-DM 6.25-15 MG/5ML PO SYRP: 5.0000 mL | ORAL_SOLUTION | Freq: Four times a day (QID) | ORAL | 0 refills | Status: AC | PRN

## 2023-04-05 NOTE — ED Triage Notes (Signed)
Pt c/o of headache, fever, body aches, chills, dizziness, sore throat, cough x 2 days. Taking ibuprofen.

## 2023-04-05 NOTE — ED Provider Notes (Addendum)
[facility]    CSN: [id number] Arrival date & time: 04/05/23  1248      History   Chief Complaint Chief Complaint  Patient presents with   Cough   Fever    HPI Danielle Guzman is a 58 y.o. female.   Presenting today with 2-day history of headache, fever, body aches, chills, dizziness, sore throat, cough.  Denies chest pain, shortness of breath, abdominal pain, nausea vomiting or diarrhea.  So far trying ibuprofen with minimal relief.  No known sick contacts recently.    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 08/26/2018   Vitamin D deficiency 12/25/2017   Physical deconditioning 03/25/2017   Obesity (BMI 30.0-34.9) 03/25/2017   Port catheter in place 12/02/2016   Dysuria 12/02/2016   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   Goals of care, counseling/discussion 11/04/2016   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   Endometrial cancer (HCC) 08/24/2016    Past Surgical History:  Procedure Laterality Date   DENTAL SURGERY     gum graft 20 yrs ago and again on 08/26/16   DILATATION & CURETTAGE/HYSTEROSCOPY WITH MYOSURE N/A 08/11/2016   Procedure: DILATATION & CURETTAGE/HYSTEROSCOPY WITH MYOSURE;  Surgeon: Marlow Baars, MD;  Location: [facility];  Service: Gynecology;  Laterality: N/A;  polyp removal   IR FLUORO GUIDE PORT INSERTION RIGHT  11/10/2016   IR REMOVAL TUN ACCESS W/ PORT W/O FL MOD SED  03/11/2017   IR US GUIDE VASC ACCESS RIGHT  11/10/2016   LYMPH NODE BIOPSY N/A  09/01/2016   Procedure: SENTINEL LYMPH NODE BIOPSY;  Surgeon: Adolphus Birchwood, MD;  Location: [facility];  Service: Gynecology;  Laterality: N/A;   ROBOTIC ASSISTED TOTAL HYSTERECTOMY WITH BILATERAL SALPINGO OOPHERECTOMY Bilateral 09/01/2016   Procedure: ROBOTIC ASSISTED TOTAL HYSTERECTOMY WITH BILATERAL SALPINGO OOPHORECTOMY;  Surgeon: Adolphus Birchwood, MD;  Location: [facility];  Service: Gynecology;  Laterality: Bilateral;   WISDOM TOOTH EXTRACTION      OB History     Gravida  2   Para      Term      Preterm      AB  2   Living         SAB  2   IAB      Ectopic      Multiple      Live Births               Home Medications    Prior to Admission medications   Medication Sig Start Date End Date Taking? Authorizing Provider  Ascorbic Acid (VITAMIN C PO) Take 1 tablet by mouth 3 (three) times daily as needed (for immune health support or cold symptoms.).   Yes [provider]  b complex vitamins tablet Take 1 tablet by mouth daily.   Yes [provider]  calcium citrate (CALCITRATE - DOSED IN MG ELEMENTAL CALCIUM) 950 MG tablet  Take 200 mg of elemental calcium by mouth daily. Patient states she takes 6 pills a day.   Yes [provider]  cholecalciferol (VITAMIN D3) 25 MCG (1000 UNIT) tablet Take by mouth daily. Patient states she is taking 10,000 international units per day   Yes [provider]  estradiol (ESTRACE VAGINAL) 0.1 MG/GM vaginal cream Insert 1 applicatorful vaginally 2 times a week. 02/22/23  Yes Cross, Melissa D, NP  fluticasone (FLONASE) 50 MCG/ACT nasal spray Place 1 spray into both nostrils 2 (two) times daily. 04/05/23  Yes Particia Nearing, PA-C  guaiFENesin (MUCINEX) 600 MG 12 hr tablet Take 1 tablet (600 mg total) by mouth 2 (two) times daily. 04/05/23  Yes Particia Nearing, PA-C  ibuprofen (ADVIL) 200 MG tablet Take 200-400 mg by mouth every 8 (eight) hours as needed.   Yes [provider]  Omega-3 Fatty Acids  (FISH OIL) 1000 MG CAPS Take 1 capsule by mouth daily.    Yes [provider]  Probiotic Product (PROBIOTIC DAILY PO) Take 1 capsule by mouth daily.   Yes [provider]  promethazine-dextromethorphan (PROMETHAZINE-DM) 6.25-15 MG/5ML syrup Take 5 mLs by mouth 4 (four) times daily as needed. 04/05/23  Yes Particia Nearing, PA-C  APPLE CIDER VINEGAR PO Take 1 Dose by mouth daily. Patient states she takes Web designer 1tsp. Daily.    [provider]  brompheniramine-pseudoephedrine-DM 30-2-10 MG/5ML syrup Take 5 mLs by mouth 4 (four) times daily as needed. cough 03/27/22   Barbette Merino, NP  clobetasol (TEMOVATE) 0.05 % external solution Apply topically 2 (two) times daily as needed. 04/01/21   [provider]  fluconazole (DIFLUCAN) 150 MG tablet Take 1 tablet (150 mg total) by mouth for vulvar yeast.  May repeat if needed 02/22/23   Warner Mccreedy D, NP  furosemide (LASIX) 20 MG tablet TAKE 1 TABLET BY MOUTH DAILY AS NEEDED 03/02/22 03/02/23  Warner Mccreedy D, NP  nystatin cream (MYCOSTATIN) Apply to groin 2 times daily as needed for yeast or itching 02/22/23   Cross, Efraim Kaufmann D, NP  OVER THE COUNTER MEDICATION Take 1 tablet by mouth daily. Tumeric once a day.    [provider]  OVER THE COUNTER MEDICATION Nitric oxide    [provider]  Pediatric Multiple Vit-Vit C (VITAMIN DAILY PO) Take 10,000 Units by mouth daily.    [provider]    Family History Family History  Problem Relation Age of Onset   Cancer Mother    Breast cancer Mother    Cancer Sister        Cervix   Cancer Maternal Aunt        Breast, uterine   Colon cancer Maternal Aunt    Cancer Paternal Grandmother        Colon   Colon cancer Paternal Grandmother    Esophageal cancer Neg Hx    Pancreatic cancer Neg Hx    Stomach cancer Neg Hx     Social History Social History   Tobacco Use   Smoking status: Former    Current packs/day: 1.50     Average packs/day: 1.5 packs/day for 8.0 years (12.0 ttl pk-yrs)    Types: Cigarettes   Smokeless tobacco: Never   Tobacco comments:    quit smoking at age 23  Vaping Use   Vaping status: Never Used  Substance Use Topics   Alcohol use: Yes    Alcohol/week: 1.0 standard drink of alcohol    Types: 1  Glasses of wine per week    Comment: each night   Drug use: No     Allergies   Latex and Tamiflu  [oseltamivir phosphate]   Review of Systems Review of Systems Per HPI  Physical Exam Triage Vital Signs ED Triage Vitals [04/05/23 1304]  Encounter Vitals Group     BP (!) 147/88     Systolic BP Percentile      Diastolic BP Percentile      Pulse Rate (!) 112     Resp 18     Temp (!) 101.9 F (38.8 C)     Temp Source Oral     SpO2 92 %     Weight      Height      Head Circumference      Peak Flow      Pain Score 8     Pain Loc      Pain Education      Exclude from Growth Chart    No data found.  Updated Vital Signs BP (!) 147/88 (BP Location: Right Arm)   Pulse (!) 112   Temp (!) 101.9 F (38.8 C) (Oral)   Resp 18   SpO2 94%   Visual Acuity Right Eye Distance:   Left Eye Distance:   Bilateral Distance:    Right Eye Near:   Left Eye Near:    Bilateral Near:     Physical Exam Vitals and nursing note reviewed.  Constitutional:      Appearance: Normal appearance.  HENT:     Head: Atraumatic.     Right Ear: Tympanic membrane and external ear normal.     Left Ear: Tympanic membrane and external ear normal.     Nose: Rhinorrhea present.     Mouth/Throat:     Mouth: Mucous membranes are moist.     Pharynx: Posterior oropharyngeal erythema present.  Eyes:     Extraocular Movements: Extraocular movements intact.     Conjunctiva/sclera: Conjunctivae normal.  Cardiovascular:     Rate and Rhythm: Normal rate and regular rhythm.     Heart sounds: Normal heart sounds.  Pulmonary:     Effort: Pulmonary effort is normal.     Breath sounds: Normal breath sounds.  No wheezing.  Musculoskeletal:        General: Normal range of motion.     Cervical back: Normal range of motion and neck supple.  Skin:    General: Skin is warm and dry.  Neurological:     Mental Status: She is alert and oriented to person, place, and time.  Psychiatric:        Mood and Affect: Mood normal.        Thought Content: Thought content normal.      UC Treatments / Results  Labs (all labs ordered are listed, but only abnormal results are displayed) Labs Reviewed  POC COVID19/FLU A&B COMBO - Abnormal; Notable for the following components:      Result Value   Influenza A Antigen, POC Positive (*)    Covid Antigen, POC Negative (*)    All other components within normal limits    EKG   Radiology No results found.  Procedures Procedures (including critical care time)  Medications Ordered in UC Medications - No data to display  Initial Impression / Assessment and Plan / UC Course  I have reviewed the triage vital signs and the nursing notes.  Pertinent labs & imaging results that were available during my care  of the patient were reviewed by me and considered in my medical decision making (see chart for details).     Rapid flu positive for influenza A.  Treat with Phenergan DM, Mucinex, Flonase, supportive over-the-counter medications and home care.  Return for worsening symptoms.  Final Clinical Impressions(s) / UC Diagnoses   Final diagnoses:  Influenza A  Fever, unspecified  Tachycardia     Discharge Instructions      I have sent over some medications to help with your symptoms and you may also take Coricidin HBP, Tylenol, use sinus rinses and hot teas.    ED Prescriptions     Medication Sig Dispense Auth. Provider   promethazine-dextromethorphan (PROMETHAZINE-DM) 6.25-15 MG/5ML syrup Take 5 mLs by mouth 4 (four) times daily as needed. 100 mL Particia Nearing, PA-C   fluticasone [facility]) 50 MCG/ACT nasal spray Place 1 spray into both  nostrils 2 (two) times daily. 16 g Particia Nearing, PA-C   guaiFENesin (MUCINEX) 600 MG 12 hr tablet Take 1 tablet (600 mg total) by mouth 2 (two) times daily. 20 tablet Particia Nearing, [place]      PDMP not reviewed this encounter.   Particia Nearing, [place] 04/05/23 1351    Roosvelt Maser [place], [place] 04/05/23 1352

## 2023-04-05 NOTE — Discharge Instructions (Signed)
I have sent over some medications to help with your symptoms and you may also take Coricidin HBP, Tylenol, use sinus rinses and hot teas.

## 2023-04-06 ENCOUNTER — Telehealth: Payer: Self-pay | Admitting: *Deleted

## 2023-04-06 NOTE — Telephone Encounter (Signed)
Patient called to let the office know that her thermoscan has been rescheduled for 04/15/23 because she is running a fever. Office thanked the patient for letting us know and hope she feels better soon.

## 2023-05-07 ENCOUNTER — Telehealth: Payer: Self-pay | Admitting: *Deleted

## 2023-05-07 NOTE — Telephone Encounter (Signed)
Spoke with Ms. Danielle Guzman and patient states she had to reschedule her thermoscan to 05/13/23. Pt advised to let our office know once she has the results and we will be looking out for an email from [place]. Pt thanked the office for calling.

## 2023-05-27 ENCOUNTER — Telehealth: Payer: Self-pay | Admitting: *Deleted

## 2023-05-27 NOTE — Telephone Encounter (Signed)
 Attempted to reach Danielle Guzman. Left a voicemail requesting call back.

## 2023-06-01 NOTE — Telephone Encounter (Signed)
 Patient returned the nurse's call. Patient stated "I did have that scan and I'm waiting on the results. Once the results are ready they will be sent to both myself and the GYN ONC clinic email."   Patient given the [facility] email

## 2023-07-02 ENCOUNTER — Telehealth: Payer: Self-pay | Admitting: Surgery

## 2023-07-02 NOTE — Telephone Encounter (Signed)
 Patient called to inform our office that her thermoscan results have been emailed to our gyn office. Confirmed receipt of email and advised patient that Melissa Cross would be notified. Patient also stated that she has scheduled an ultrasound with herscan and that will be on Monday at 11am. She stated she wanted to be proactive. Advised patient that Vira Grieves would be notified of this as well and that our office would call her back Monday to discuss further.

## 2023-07-05 ENCOUNTER — Telehealth: Payer: Self-pay | Admitting: *Deleted

## 2023-07-05 NOTE — Telephone Encounter (Signed)
 Attempted to reach Ms. Gest. Left voicemail relaying message from Vira Grieves, NP that images and report have been reviewed and plan for ultrasound is agreed. Once you have this done, please reach out to the office at [phone] so results can be followed up on.

## 2023-08-23 ENCOUNTER — Inpatient Hospital Stay

## 2023-08-23 ENCOUNTER — Inpatient Hospital Stay: Payer: 59 | Attending: Gynecologic Oncology | Admitting: Gynecologic Oncology

## 2023-08-23 VITALS — BP 129/65 | HR 63 | Temp 97.8°F | Resp 16 | Wt 230.2 lb

## 2023-08-23 DIAGNOSIS — Z800 Family history of malignant neoplasm of digestive organs: Secondary | ICD-10-CM | POA: Diagnosis not present

## 2023-08-23 DIAGNOSIS — Z1509 Genetic susceptibility to other malignant neoplasm: Secondary | ICD-10-CM | POA: Insufficient documentation

## 2023-08-23 DIAGNOSIS — R600 Localized edema: Secondary | ICD-10-CM | POA: Insufficient documentation

## 2023-08-23 DIAGNOSIS — Z87891 Personal history of nicotine dependence: Secondary | ICD-10-CM | POA: Insufficient documentation

## 2023-08-23 DIAGNOSIS — Z9221 Personal history of antineoplastic chemotherapy: Secondary | ICD-10-CM | POA: Diagnosis not present

## 2023-08-23 DIAGNOSIS — E559 Vitamin D deficiency, unspecified: Secondary | ICD-10-CM | POA: Insufficient documentation

## 2023-08-23 DIAGNOSIS — Z8542 Personal history of malignant neoplasm of other parts of uterus: Secondary | ICD-10-CM | POA: Insufficient documentation

## 2023-08-23 DIAGNOSIS — Z8049 Family history of malignant neoplasm of other genital organs: Secondary | ICD-10-CM | POA: Diagnosis not present

## 2023-08-23 DIAGNOSIS — Z1501 Genetic susceptibility to malignant neoplasm of breast: Secondary | ICD-10-CM | POA: Diagnosis not present

## 2023-08-23 DIAGNOSIS — Z803 Family history of malignant neoplasm of breast: Secondary | ICD-10-CM | POA: Insufficient documentation

## 2023-08-23 DIAGNOSIS — R635 Abnormal weight gain: Secondary | ICD-10-CM

## 2023-08-23 DIAGNOSIS — R82998 Other abnormal findings in urine: Secondary | ICD-10-CM

## 2023-08-23 DIAGNOSIS — C541 Malignant neoplasm of endometrium: Secondary | ICD-10-CM

## 2023-08-23 DIAGNOSIS — Z1502 Genetic susceptibility to malignant neoplasm of ovary: Secondary | ICD-10-CM | POA: Insufficient documentation

## 2023-08-23 DIAGNOSIS — B3731 Acute candidiasis of vulva and vagina: Secondary | ICD-10-CM

## 2023-08-23 DIAGNOSIS — R5383 Other fatigue: Secondary | ICD-10-CM

## 2023-08-23 LAB — COMPREHENSIVE METABOLIC PANEL WITH GFR
ALT: 33 U/L (ref 0–44)
AST: 24 U/L (ref 15–41)
Albumin: 4.2 g/dL (ref 3.5–5.0)
Alkaline Phosphatase: 96 U/L (ref 38–126)
Anion gap: 7 (ref 5–15)
BUN: 16 mg/dL (ref 6–20)
CO2: 26 mmol/L (ref 22–32)
Calcium: 9 mg/dL (ref 8.9–10.3)
Chloride: 103 mmol/L (ref 98–111)
Creatinine, Ser: 0.84 mg/dL (ref 0.44–1.00)
GFR, Estimated: >60 mL/min (ref >60)
Glucose, Bld: 92 mg/dL (ref 70–99)
Potassium: 3.9 mmol/L (ref 3.5–5.1)
Sodium: 136 mmol/L (ref 135–145)
Total Bilirubin: 0.6 mg/dL (ref 0.0–1.2)
Total Protein: 6.4 g/dL — ABNORMAL LOW (ref 6.5–8.1)

## 2023-08-23 LAB — TSH: TSH: 1.8 u[IU]/mL (ref 0.350–4.500)

## 2023-08-23 LAB — VITAMIN D 25 HYDROXY (VIT D DEFICIENCY, FRACTURES): Vit D, 25-Hydroxy: 79.24 ng/mL (ref 30–100)

## 2023-08-23 MED ORDER — NYSTATIN 100000 UNIT/GM EX CREA: 1.0000 | TOPICAL_CREAM | Freq: Two times a day (BID) | CUTANEOUS | 3 refills | Status: DC

## 2023-08-23 MED ORDER — FLUCONAZOLE 150 MG PO TABS: 150.0000 mg | ORAL_TABLET | ORAL | 4 refills | Status: DC | PRN

## 2023-08-23 NOTE — Progress Notes (Unsigned)
 Follow-up Note: Gyn Oncology  CC:  Chief Complaint  Patient presents with   Endometrial cancer [facility])   Assessment/Plan: CICLALY MULCAHEY  is a 58 y.o. 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 Alphonso Aschoff. 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 She completed adjuvant carboplatin  and paclitaxel  x 6 cycles between 11/12/2016 - 02/23/17. Declined genetics consultation despite loss of MSH6 on IHC and strong family history of cancer including a confirmed diagnosis of Lynch syndrome in her mother's twin sister.   No findings concerning for recurrence on today's examination. She is advised to follow up in six months or sooner if any needs or symptoms arise. Reportable signs and symptoms reviewed. She will be contacted with the results of her lab work (Vit D, Cmet, and TSH) from today. Has had history of vitamin d  deficiency and with fatigue, ordered TSH. She would like her liver function checked as well. Cmet ordered to assess glucose level given frequent yeast and kidney function given changes in urine. Information given on weight loss and the [facility]. She will call if she would like a referral. She will continue to follow up with her holistic provider as planned. She is advised to call for any needs or concerns.  HPI: Zyasia Halbleib is a 58 year old [id number] initially seen in consultation at the request of Dr Shelvy Dickens and Dr Luan Rumpf for newly diagnosed endometrial cancer.  She underwent early menopause at age 23 with all of her female relatives experiencing early menopause as well. She developed postmenopausal bleeding in March 2018 and sought care with Dr Shelvy Dickens on 05/2016. A TVUS showed an endometrium of 1.8 cm with a 3 cm hypervascular polyp. On 08/11/16, she underwent a hysteroscopy/D&C with Dr. Luan Rumpf. Final pathology revealed endometrial adenocarcinoma  with the majority of the specimen revealing grade 2 endometrioid endometrial cancer with a small focus (5%) of clear cell carcinoma and a few foci suspicious for serous carcinoma.  Her family history includes her mother with a history of breast cancer x 3 and a sister with a history of cervical cancer. Her maternal aunt has a history of breast cancer. A different maternal aunt (her mother's twin sister) was diagnosed with Lynch Syndrome after having uterine and colon cancers. Her maternal cousin had uterine cancer. Her paternal grandmother had a diagnosis of colon cancer.  On 09/01/16, she underwent robotic assisted total hysterectomy, BSO, SLN biopsy with Dr. Alphonso Aschoff. Surgery was uncomplicated. The final pathology revealed a 1.8 cm grade 2 endometrioid endometrial cancer (with rare foci of squamous differentiation and a rare microscopic focus of serous differentiation). There was luminal foci of carcinoma within the right and left fallopian tubes and the right fallopian tube lumen involvement showed a focus of similar appearing chondroid stroma with adjacent focus of serous carcinoma. The lymph nodes and cervix were negative for carcinoma and the uterine tumor showed no myometrial invasion and no LVSI. Final pathology revealed loss of MSH6 on IHC.   She declined genetics consultation despite counseling that she may have Lynch syndrome which increases her risk for other cancers (that might be detected early or prevented with more frequent screenings that can be facilitated with this diagnosis).  Postoperatively, she completed 6 cycles of carboplatin  and paclitaxel  between 11/12/17-12/11/9 after experiencing some trepidation regarding toxicity overwhelming benefits and this concern delayed initiation of adjuvant therapy.   She  has a history of back and abdominal pain, which prompted a CT abd/pelvis on February 28, 2018.  This showed mild superior endplate compression fracture deformity of T12 which was new  from 2018, no retropulsion.  There was no evidence of recurrent or metastatic disease. She was seen by a chiropractor for this and feels that this is improved her symptoms substantially.  CT abd/pelvis in December, 2020 was ordered due to pelvic pain symptoms. This was negative for evidence of recurrence. In the Fall of 2022, she reported lower pelvic pain for the past three months, increasing since the end of October 2022. A CT scan of the abdomen and pelvis was obtained on 02/2021 to rule out recurrence. Findings were negative for lymphadenopathy or metastatic disease with stable tiny pulmonary nodules.   Due to an erythematous lesion noted at the vaginal cuff in 02/2021, she underwent a biopsy which returned as squamous mucosa with inflammation and reactive changes with no evidence of malignancy. A new lesion at the vaginal cuff was biopsied on 03/02/2022 returning chronic inflammation.   Interval Hx: She presents today to the office for continued follow up. She reports overall doing well. She has been stressed at times. She just got back in town from [place]  where she helped her mother. Overall she had a nice trip. She is concerned about her weight gain and she has not changed her routine. She is selective with her dietary choices. She is tolerating her diet with no nausea or emesis. Does had decreased appetite at times along with intermittent bloating which has been present and unchanged for years. She tries to limit pasta to once or twice monthly, eats diary daily. She tries to do intermittent fasting and is wondering if she should adjust the schedule in order to jump start weight loss. She is not as physically active as she would like. She has tried to not eat as late and eats around 9 pm instead of 10 pm.    She is hoping decreasing the amount of jobs she has currently will help with her stress and increase schedule flexibility. Her bladder habits are at baseline. She does note having a 2 week  period where her urine was green in color with an odor. This resolved without treatment. She denies any medication changes during the time and feels she was eating more green leafy vegetables. Has not used vaginal estrogen cream recently. Her bowels are functioning without rectal bleeding. Her lower extremity edema remains and is unchanged. No vaginal bleeding. She has noticed more vaginal discharge, less after taking diflucan .    She continues to perform frequent breast exams. She is planning for follow up with her dermatologist to check several areas. She continues to use nystatin  cream intermittently for groin/vulva candidiasis and also uses diflucan  if needed. Refills have been requested on both medications. She did undergo breast thermoscan along with an ultrasound. The ultrasound was performed on 07/05/2023 and was negative.    Review of Systems: See interval.  Constitutional: Feels well but stressed at times. Appetite is good. No early satiety. She has had some weight gain since last visit.  Cardiovascular: No chest pain, shortness of breath, or edema.  Pulmonary: No cough or wheeze.  Gastrointestinal: No nausea, vomiting, or diarrhea. No bright red blood per rectum or change in bowel movement.  Genitourinary: No frequency, urgency, or dysuria. Green urine for a period of 2 weeks with odor.  Musculoskeletal: Intermittent pubic pain that is unchanged. Neurologic: No weakness, numbness, or change  in gait.  Psychology: She has been stressed intermittently. Intermittent insomnia.  Skin: Skin changes under left eye remain, seborrheic keratosis on right shoulder  Health Maintenance: Breast: Had thermoscan and ultrasound Colonoscopy: Declines to have, saw GI in the past for anal lesion, states GI said to call prn Declines PCP referral. See holistic provider  Current Meds:  Outpatient Encounter Medications as of 08/23/2023  Medication Sig   APPLE CIDER VINEGAR PO Take 1 Dose by mouth daily.  Patient states she takes Web designer 1tsp. Daily.   Ascorbic Acid (VITAMIN C PO) Take 1 tablet by mouth 3 (three) times daily as needed (for immune health support or cold symptoms.).   b complex vitamins tablet Take 1 tablet by mouth daily.   brompheniramine-pseudoephedrine-DM 30-2-10 MG/5ML syrup Take 5 mLs by mouth 4 (four) times daily as needed. cough   calcium citrate (CALCITRATE - DOSED IN MG ELEMENTAL CALCIUM) 950 MG tablet Take 200 mg of elemental calcium by mouth daily. Patient states she takes 6 pills a day.   cholecalciferol (VITAMIN D3) 25 MCG (1000 UNIT) tablet Take by mouth daily. Patient states she is taking 10,000 international units per day   clobetasol (TEMOVATE) 0.05 % external solution Apply topically 2 (two) times daily as needed.   estradiol  (ESTRACE  VAGINAL) 0.1 MG/GM vaginal cream Insert 1 applicatorful vaginally 2 times a week.   fluticasone  (FLONASE ) 50 MCG/ACT nasal spray Place 1 spray into both nostrils 2 (two) times daily.   guaiFENesin  (MUCINEX ) 600 MG 12 hr tablet Take 1 tablet (600 mg total) by mouth 2 (two) times daily.   ibuprofen (ADVIL) 200 MG tablet Take 200-400 mg by mouth every 8 (eight) hours as needed.   Omega-3 Fatty Acids (FISH OIL) 1000 MG CAPS Take 1 capsule by mouth daily.    OVER THE COUNTER MEDICATION Take 1 tablet by mouth daily. Tumeric once a day.   OVER THE COUNTER MEDICATION Nitric oxide   Pediatric Multiple Vit-Vit C (VITAMIN DAILY PO) Take 10,000 Units by mouth daily.   Probiotic Product (PROBIOTIC DAILY PO) Take 1 capsule by mouth daily.   promethazine -dextromethorphan (PROMETHAZINE -DM) 6.25-15 MG/5ML syrup Take 5 mLs by mouth 4 (four) times daily as needed.   [DISCONTINUED] fluconazole  (DIFLUCAN ) 150 MG tablet Take 1 tablet (150 mg total) by mouth for vulvar yeast.  May repeat if needed   [DISCONTINUED] nystatin  cream (MYCOSTATIN ) Apply to groin 2 times daily as needed for yeast or itching   fluconazole  (DIFLUCAN ) 150 MG tablet Take 1  tablet (150 mg total) by mouth for vulvar yeast.  May repeat if needed   furosemide  (LASIX ) 20 MG tablet TAKE 1 TABLET BY MOUTH DAILY AS NEEDED   nystatin  cream (MYCOSTATIN ) Apply to groin 2 times daily as needed for yeast or itching   No facility-administered encounter medications on file as of 08/23/2023.    Allergy:  Allergies  Allergen Reactions   Latex     Long exposure causes a rash   Tamiflu  [Oseltamivir Phosphate] Nausea Only    Social Hx:   Social History   Socioeconomic History   Marital status: Married    Spouse name: Gwinda Leopard "Donnie"   Number of children: 0   Years of education: Not on file   Highest education level: Not on file  Occupational History   Occupation: massage therapist  Tobacco Use   Smoking status: Former    Current packs/day: 1.50    Average packs/day: 1.5 packs/day for 8.0 years (12.0 ttl pk-yrs)  Types: Cigarettes   Smokeless tobacco: Never   Tobacco comments:    quit smoking at age 69  Vaping Use   Vaping status: Never Used  Substance and Sexual Activity   Alcohol use: Yes    Alcohol/week: 1.0 standard drink of alcohol    Types: 1 Glasses of wine per week    Comment: each night   Drug use: No   Sexual activity: Yes    Birth control/protection: Post-menopausal  Other Topics Concern   Not on file  Social History Narrative   Not on file   Social Drivers of Health   Financial Resource Strain: Not on file  Food Insecurity: Not on file  Transportation Needs: Not on file  Physical Activity: Not on file  Stress: Not on file  Social Connections: Not on file  Intimate Partner Violence: Not on file    Past Surgical Hx:  Past Surgical History:  Procedure Laterality Date   DENTAL SURGERY     gum graft 20 yrs ago and again on 08/26/16   DILATATION & CURETTAGE/HYSTEROSCOPY WITH MYOSURE N/A 08/11/2016   Procedure: DILATATION & CURETTAGE/HYSTEROSCOPY WITH MYOSURE;  Surgeon: Luan Rumpf, MD;  Location: [facility];  Service: Gynecology;   Laterality: N/A;  polyp removal   IR FLUORO GUIDE PORT INSERTION RIGHT  11/10/2016   IR REMOVAL TUN ACCESS W/ PORT W/O FL MOD SED  03/11/2017   IR US  GUIDE VASC ACCESS RIGHT  11/10/2016   LYMPH NODE BIOPSY N/A 09/01/2016   Procedure: SENTINEL LYMPH NODE BIOPSY;  Surgeon: Alphonso Aschoff, MD;  Location: [facility];  Service: Gynecology;  Laterality: N/A;   ROBOTIC ASSISTED TOTAL HYSTERECTOMY WITH BILATERAL SALPINGO OOPHERECTOMY Bilateral 09/01/2016   Procedure: ROBOTIC ASSISTED TOTAL HYSTERECTOMY WITH BILATERAL SALPINGO OOPHORECTOMY;  Surgeon: Alphonso Aschoff, MD;  Location: [facility];  Service: Gynecology;  Laterality: Bilateral;   WISDOM TOOTH EXTRACTION      Past Medical Hx:  Past Medical History:  Diagnosis Date   Cancer (HCC) 2018   endometrial    Compression fx, lumbar spine (HCC)    Compression Fracture T12   Headache    hx migraines yrs ago   Malaria as child   Miscarriage    x 2   Pneumonia yrs ago   Seizures (HCC)    petite mal seizure in high school x 1, none since   Sinus infection 08/2017    Past Gynecological History:  Premature menopause No LMP recorded. Patient has had a hysterectomy.  Family Hx:  Family History  Problem Relation Age of Onset   Cancer Mother    Breast cancer Mother    Cancer Sister        Cervix   Cancer Maternal Aunt        Breast, uterine   Colon cancer Maternal Aunt    Cancer Paternal Grandmother        Colon   Colon cancer Paternal Grandmother    Esophageal cancer Neg Hx    Pancreatic cancer Neg Hx    Stomach cancer Neg Hx     Vitals:  Blood pressure 129/65, pulse 63, temperature 97.8 F (36.6 C), temperature source Oral, resp. rate 16, weight 230 lb 3.2 oz (104.4 kg), SpO2 100%.  Physical Exam: General: Well developed, well nourished female in no acute distress. Alert and oriented x 3.  Neck: Supple without any enlargements.  Lymph node survey: No cervical, supraclavicular, or inguinal adenopathy.  Cardiovascular: Regular rate and rhythm. S1  and S2 normal.  Lungs: Clear to  auscultation bilaterally. No wheezes/crackles/rhonchi noted.  Skin: Seborrheic keratosis noted on right shoulder. Small area of erythema with dry skin under left eye. Back: No CVA tenderness.  Breasts: Deferred given recent thermoscan and ultrasound Abdomen: Abdomen soft, non-tender and obese. Active bowel sounds in all quadrants. No evidence of a fluid wave or abdominal masses. No nodularity noted at healed laparoscopic sites.  Genitourinary:    Vulva/vagina: Normal external female genitalia. No lesions.    Urethra: No lesions or masses.    Vagina: Vagina atrophic. No bleeding or discharge noted. No vaginal lesions noted. No palpable masses or nodularity.  Rectal: Good tone, no masses, no cul de sac nodularity.  Extremities: Bilateral lower extremity edema, non pitting, equal. No bilateral cyanosis or clubbing.     Suellyn Emory, NP  08/24/2023, 4:27 PM

## 2023-08-23 NOTE — Patient Instructions (Addendum)
 It was great seeing you today. No concerning findings on today's exam.  We will plan on checking a thyroid  level, vitamin d  level, and metabolic panel today which looks at your kidney and liver function. If you notice the development of the greenish urine, please call the office so we can obtain a sample.   I agree with the plan on making small changes to your activity, fasting etc to see if this helps to get weight loss started. If you would like a referral to the [facility], I am happy to place this for you at any time.    Plan to follow up in six months or sooner if needed. Please call for any needs or concerns or new symptoms.    Symptoms to report to your health care team include vaginal bleeding, rectal bleeding, bloating, weight loss without effort, new and persistent pain, new and  persistent fatigue, new leg swelling, new masses (i.e., bumps in your neck or groin), new and persistent cough, new and persistent nausea and vomiting, change in bowel or bladder habits, and any other concerns.

## 2023-08-24 ENCOUNTER — Ambulatory Visit: Payer: Self-pay | Admitting: *Deleted

## 2023-08-24 NOTE — Telephone Encounter (Signed)
 Attempt to reach patient to relay results of labs. Left voicemail requesting call back.

## 2023-08-24 NOTE — Telephone Encounter (Signed)
-----   Message from Suellyn Emory sent at 08/23/2023  3:21 PM EDT ----- You can let her know her Cmet is normal. Her electrolytes are in normal range. Total protein has increased to 6.4 so almost in normal range (was 6 last yr). Kidney function and liver function levels are normal as well. Good news. We will let her know about the TSH and vitamin d  when back. ----- Message ----- From: Dannis Dy, Lab In [place] Sent: 08/23/2023   3:03 PM EDT To: Suellyn Emory, NP

## 2023-08-25 ENCOUNTER — Encounter: Payer: Self-pay | Admitting: General Practice

## 2023-08-25 NOTE — Telephone Encounter (Signed)
 2nd attempt to reach patient to relay results of labs. Left voicemail requesting call back to [phone].

## 2023-08-25 NOTE — Telephone Encounter (Signed)
-----   Message from Suellyn Emory sent at 08/23/2023  3:21 PM EDT ----- You can let her know her Cmet is normal. Her electrolytes are in normal range. Total protein has increased to 6.4 so almost in normal range (was 6 last yr). Kidney function and liver function levels are normal as well. Good news. We will let her know about the TSH and vitamin d  when back. ----- Message ----- From: Dannis Dy, Lab In [place] Sent: 08/23/2023   3:03 PM EDT To: Suellyn Emory, NP

## 2023-08-27 NOTE — Telephone Encounter (Signed)
 Spoke with Ms. Schillo and relayed message from Vira Grieves, NP that all her labs are normal including Vit D level is 79.24 -advised to stick with a maintenance dose of around 2,000 international international units daily. Normal range 30-100.  Thyroid  level is at 1.800 which is within normal range. Range is .350-4.50 Good News! No intervention or medication needed. Pt verbalized understanding and thanked the office.

## 2023-08-27 NOTE — Telephone Encounter (Signed)
-----   Message from Suellyn Emory sent at 08/24/2023 10:47 AM EDT ----- Please let her know her Vitamin D  level is at 79.24- good job at getting this up. Was 25.18 before. Would stick with the maintenance dosing (around 2,000 international units daily). Normal range 30-100.   Her thyroid  level is at 1.800 which is within normal range. Range is 0.350-4.500. Good news! No results needing intervention or medication.

## 2023-08-27 NOTE — Telephone Encounter (Signed)
-----   Message from Suellyn Emory sent at 08/23/2023  3:21 PM EDT ----- You can let her know her Cmet is normal. Her electrolytes are in normal range. Total protein has increased to 6.4 so almost in normal range (was 6 last yr). Kidney function and liver function levels are normal as well. Good news. We will let her know about the TSH and vitamin d  when back. ----- Message ----- From: Dannis Dy, Lab In [place] Sent: 08/23/2023   3:03 PM EDT To: Suellyn Emory, NP

## 2023-08-27 NOTE — Telephone Encounter (Signed)
 3rd attempt to reach patient to relay lab results. Left voicemail requesting call back.

## 2024-02-02 ENCOUNTER — Ambulatory Visit
Admission: EM | Admit: 2024-02-02 | Discharge: 2024-02-02 | Disposition: A | Discharge status: Home / Self Care | Attending: Family Medicine | Admitting: Family Medicine

## 2024-02-02 ENCOUNTER — Ambulatory Visit (INDEPENDENT_AMBULATORY_CARE_PROVIDER_SITE_OTHER)

## 2024-02-02 ENCOUNTER — Ambulatory Visit: Admitting: Orthopedic Surgery

## 2024-02-02 ENCOUNTER — Other Ambulatory Visit

## 2024-02-02 ENCOUNTER — Encounter: Payer: Self-pay | Admitting: Orthopedic Surgery

## 2024-02-02 DIAGNOSIS — S52121A Displaced fracture of head of right radius, initial encounter for closed fracture: Secondary | ICD-10-CM

## 2024-02-02 DIAGNOSIS — W19XXXA Unspecified fall, initial encounter: Secondary | ICD-10-CM

## 2024-02-02 DIAGNOSIS — S52124A Nondisplaced fracture of head of right radius, initial encounter for closed fracture: Secondary | ICD-10-CM | POA: Diagnosis not present

## 2024-02-02 DIAGNOSIS — M79601 Pain in right arm: Secondary | ICD-10-CM | POA: Diagnosis not present

## 2024-02-02 MED ORDER — ACETAMINOPHEN-CODEINE 300-30 MG PO TABS: ORAL_TABLET | ORAL | 0 refills | Status: DC

## 2024-02-02 NOTE — Discharge Instructions (Addendum)
 I have given recommendation for an orthopedist that you may call when you leave here today but you can of course follow-up with any orthopedist you feel most comfortable.  In the meantime we have immobilized with a splint and given you a sling for comfort.  Elevate at rest to help with swelling, ibuprofen and Tylenol  as needed for pain.

## 2024-02-02 NOTE — ED Triage Notes (Signed)
 Pt reports right arm injury, states she is unable to to full flex or extend the arm, also limited to rotation. Has been treating the sx's with RICE method, and ibuprofen. Pt states injury occurred while walking into [employer] stating she fell and land on the right arm swelling is present

## 2024-02-02 NOTE — ED Provider Notes (Signed)
 [facility]    CSN: [id number] Arrival date & time: 02/02/24  0803      History   Chief Complaint No chief complaint on file.   HPI Danielle Guzman is a 58 y.o. female.   Patient presenting today with 1 day history of right arm pain particularly around the right elbow after a fall yesterday.  She states she missed a step in front of her and was carrying a box causing her to fall onto the outer part of the right arm.  Having difficulty moving the arm at the elbow and states pain sometimes radiates down into the wrist.  Denies swelling, discoloration, numbness, tingling, head injury, loss of consciousness, pain elsewhere.  So far trying RICE and ibuprofen with minimal relief.    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 08/26/2018   Vitamin D  deficiency 12/25/2017   Physical deconditioning 03/25/2017   Obesity (BMI 30.0-34.9) 03/25/2017   Port catheter in place 12/02/2016   Dysuria 12/02/2016   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   Goals of care, counseling/discussion 11/04/2016   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   Endometrial cancer (HCC) 08/24/2016    Past Surgical History:  Procedure Laterality Date   DENTAL SURGERY     gum graft 20 yrs ago and again on 08/26/16   DILATATION & CURETTAGE/HYSTEROSCOPY WITH MYOSURE N/A 08/11/2016   Procedure: DILATATION & CURETTAGE/HYSTEROSCOPY WITH MYOSURE;  Surgeon: Gretta Gums, MD;  Location: [facility];  Service: Gynecology;  Laterality:  N/A;  polyp removal   IR FLUORO GUIDE PORT INSERTION RIGHT  11/10/2016   IR REMOVAL TUN ACCESS W/ PORT W/O FL MOD SED  03/11/2017   IR US  GUIDE VASC ACCESS RIGHT  11/10/2016   LYMPH NODE BIOPSY N/A 09/01/2016   Procedure: SENTINEL LYMPH NODE BIOPSY;  Surgeon: Eloy Herring, MD;  Location: [facility];  Service: Gynecology;  Laterality: N/A;   ROBOTIC ASSISTED TOTAL HYSTERECTOMY WITH BILATERAL SALPINGO OOPHERECTOMY Bilateral 09/01/2016   Procedure: ROBOTIC ASSISTED TOTAL HYSTERECTOMY WITH BILATERAL SALPINGO OOPHORECTOMY;  Surgeon: Eloy Herring, MD;  Location: [facility];  Service: Gynecology;  Laterality: Bilateral;   WISDOM TOOTH EXTRACTION      OB History     Gravida  2   Para      Term      Preterm      AB  2   Living         SAB  2   IAB      Ectopic      Multiple      Live Births               Home Medications    Prior to Admission medications   Medication Sig Start Date End Date Taking? Authorizing Provider  acetaminophen -codeine (TYLENOL  #3) 300-30 MG tablet 1 po q 8 hrs prn 02/02/24   Addie Cordella Hamilton, MD  APPLE  CIDER VINEGAR PO Take 1 Dose by mouth daily. Patient states she takes Web Designer 1tsp. Daily.    [provider]  Ascorbic Acid (VITAMIN C PO) Take 1 tablet by mouth 3 (three) times daily as needed (for immune health support or cold symptoms.).    [provider]  b complex vitamins tablet Take 1 tablet by mouth daily.    [provider]  brompheniramine-pseudoephedrine-DM 30-2-10 MG/5ML syrup Take 5 mLs by mouth 4 (four) times daily as needed. cough 03/27/22   Myrna Camelia HERO, NP  calcium citrate (CALCITRATE - DOSED IN MG ELEMENTAL CALCIUM) 950 MG tablet Take 200 mg of elemental calcium by mouth daily. Patient states she takes 6 pills a day.    [provider]  cholecalciferol (VITAMIN D3) 25 MCG (1000 UNIT) tablet Take by mouth daily. Patient states she is taking 10,000 international units per day    [provider]  clobetasol (TEMOVATE) 0.05 % external solution Apply topically 2 (two) times daily as needed. 04/01/21   [provider]  estradiol  (ESTRACE  VAGINAL) 0.1 MG/GM vaginal cream Insert 1 applicatorful vaginally 2 times a week. 02/22/23   Cross, Melissa D, NP  fluconazole  (DIFLUCAN ) 150 MG tablet Take 1 tablet (150 mg total) by mouth for vulvar yeast.  May repeat if needed 08/23/23   Cross, Melissa D, NP  fluticasone  (FLONASE ) 50 MCG/ACT nasal spray Place 1 spray into both nostrils 2 (two) times daily. 04/05/23   Stuart Vernell Norris, PA-C  furosemide  (LASIX ) 20 MG tablet TAKE 1 TABLET BY MOUTH DAILY AS NEEDED 03/02/22 03/02/23  Cross, Melissa D, NP  guaiFENesin  (MUCINEX ) 600 MG 12 hr tablet Take 1 tablet (600 mg total) by mouth 2 (two) times daily. 04/05/23   Stuart Vernell Norris, PA-C  ibuprofen (ADVIL) 200 MG tablet Take 200-400 mg by mouth every 8 (eight) hours as needed.    [provider]  nystatin  cream (MYCOSTATIN ) Apply to groin 2 times daily as needed for yeast or itching 08/23/23   Cross, Melissa D, NP  Omega-3 Fatty Acids (FISH OIL) 1000 MG CAPS Take 1 capsule by mouth daily.     [provider]  OVER THE COUNTER MEDICATION Take 1 tablet by mouth daily. Tumeric once a day.    [provider]  OVER THE COUNTER MEDICATION Nitric oxide    [provider]  Pediatric Multiple Vit-Vit C (VITAMIN DAILY PO) Take 10,000 Units by mouth daily.    [provider]  Probiotic Product (PROBIOTIC DAILY PO) Take 1 capsule by mouth daily.    [provider]  promethazine -dextromethorphan (PROMETHAZINE -DM) 6.25-15 MG/5ML syrup Take 5 mLs by mouth 4 (four) times daily as needed. 04/05/23   Stuart Vernell Norris, PA-C    Family History Family History  Problem Relation Age of Onset   Cancer Mother    Breast cancer Mother    Cancer Sister        Cervix   Cancer Maternal Aunt        Breast, uterine   Colon cancer Maternal Aunt     Cancer Paternal Grandmother        Colon   Colon cancer Paternal Grandmother    Esophageal cancer Neg Hx    Pancreatic cancer Neg Hx    Stomach cancer Neg Hx     Social History Social History   Tobacco Use   Smoking status: Former    Current packs/day: 1.50    Average packs/day: 1.5 packs/day for 8.0  years (12.0 ttl pk-yrs)    Types: Cigarettes   Smokeless tobacco: Never   Tobacco comments:    quit smoking at age 50  Vaping Use   Vaping status: Never Used  Substance Use Topics   Alcohol use: Yes    Alcohol/week: 1.0 standard drink of alcohol    Types: 1 Glasses of wine per week    Comment: each night   Drug use: No     Allergies   Latex and Tamiflu  [oseltamivir phosphate]   Review of Systems Review of Systems PER HPI  Physical Exam Triage Vital Signs ED Triage Vitals  Encounter Vitals Group     BP 02/02/24 0816 (!) 147/89     Girls Systolic BP Percentile --      Girls Diastolic BP Percentile --      Boys Systolic BP Percentile --      Boys Diastolic BP Percentile --      Pulse Rate 02/02/24 0816 74     Resp 02/02/24 0816 20     Temp 02/02/24 0816 (!) 97.5 F (36.4 C)     Temp Source 02/02/24 0816 Oral     SpO2 02/02/24 0816 97 %     Weight --      Height --      Head Circumference --      Peak Flow --      Pain Score 02/02/24 0821 7     Pain Loc --      Pain Education --      Exclude from Growth Chart --    No data found.  Updated Vital Signs BP (!) 147/89 (BP Location: Right Arm)   Pulse 74   Temp (!) 97.5 F (36.4 C) (Oral)   Resp 20   SpO2 97%   Visual Acuity Right Eye Distance:   Left Eye Distance:   Bilateral Distance:    Right Eye Near:   Left Eye Near:    Bilateral Near:     Physical Exam Vitals and nursing note reviewed.  Constitutional:      Appearance: Normal appearance. She is not ill-appearing.  HENT:     Head: Atraumatic.  Eyes:     Extraocular Movements: Extraocular movements intact.     Conjunctiva/sclera:  Conjunctivae normal.  Cardiovascular:     Rate and Rhythm: Normal rate.  Pulmonary:     Effort: Pulmonary effort is normal.  Musculoskeletal:        General: Tenderness and signs of injury present. Normal range of motion.     Cervical back: Normal range of motion and neck supple.     Comments: Tenderness to palpation to the right elbow, decreased range of motion to this area due to pain.  No point tenderness to the right wrist or hand.  Right shoulder range of motion intact  Skin:    General: Skin is warm and dry.     Findings: No bruising or erythema.  Neurological:     Mental Status: She is alert and oriented to person, place, and time.     Comments: Right upper extremity neurovascularly intact  Psychiatric:        Mood and Affect: Mood normal.        Thought Content: Thought content normal.        Judgment: Judgment normal.      UC Treatments / Results  Labs (all labs ordered are listed, but only abnormal results are displayed) Labs Reviewed - No data to display  EKG   Radiology DG Wrist Complete Right Result Date: 02/02/2024 CLINICAL DATA:  Fall and trauma to the right upper extremity. EXAM: RIGHT WRIST - COMPLETE 3+ VIEW; RIGHT ELBOW - COMPLETE 3+ VIEW COMPARISON:  None Available. FINDINGS: Nondisplaced fracture of the radial head. No other acute fracture. No dislocation. The bones are osteopenic. There is elevation of the anterior and posterior fat pad suggestive of joint effusion. The soft tissues are unremarkable. IMPRESSION: Nondisplaced fracture of the radial head. Electronically Signed   By: Vanetta Chou M.D.   On: 02/02/2024 09:15   DG Elbow Complete Right Result Date: 02/02/2024 CLINICAL DATA:  Fall and trauma to the right upper extremity. EXAM: RIGHT WRIST - COMPLETE 3+ VIEW; RIGHT ELBOW - COMPLETE 3+ VIEW COMPARISON:  None Available. FINDINGS: Nondisplaced fracture of the radial head. No other acute fracture. No dislocation. The bones are osteopenic. There is  elevation of the anterior and posterior fat pad suggestive of joint effusion. The soft tissues are unremarkable. IMPRESSION: Nondisplaced fracture of the radial head. Electronically Signed   By: Vanetta Chou M.D.   On: 02/02/2024 09:15    Procedures Procedures (including critical care time)  Medications Ordered in UC Medications - No data to display  Initial Impression / Assessment and Plan / UC Course  I have reviewed the triage vital signs and the nursing notes.  Pertinent labs & imaging results that were available during my care of the patient were reviewed by me and considered in my medical decision making (see chart for details).     X-ray of the right wrist and elbow showing a radial head fracture.  Placed in a long-arm splint, given a sling for comfort and discussed orthopedic follow-up for further evaluation.  Over-the-counter pain relievers as needed.  Final Clinical Impressions(s) / UC Diagnoses   Final diagnoses:  Right arm pain  Closed nondisplaced fracture of head of right radius, initial encounter  Fall, initial encounter     Discharge Instructions      I have given recommendation for an orthopedist that you may call when you leave here today but you can of course follow-up with any orthopedist you feel most comfortable.  In the meantime we have immobilized with a splint and given you a sling for comfort.  Elevate at rest to help with swelling, ibuprofen and Tylenol  as needed for pain.    ED Prescriptions   None    PDMP not reviewed this encounter.   Stuart Vernell Norris, [place] 02/02/24 [phone]

## 2024-02-02 NOTE — Progress Notes (Signed)
 Office Visit Note   Patient: Danielle Guzman           Date of Birth: 1965/06/27           MRN: [id number] Visit Date: 02/02/2024 Requested by: No referring provider defined for this encounter. PCP: Patient, No Pcp Per  Subjective: Chief Complaint  Patient presents with   Other    Right elbow and wrist pain since fall     HPI: Danielle Guzman is a 58 y.o. female who presents to the office reporting right arm pain.  She fell carrying a box of sweet potatoes.  She has been in a splint and sling.  Has 7 out of 10 pain at rest in the elbow.  Denies any shoulder or wrist pain.  Ibuprofen and ice helps some.  She is right-hand dominant.  Works as a teacher, adult education.  No prior history of right upper extremity surgery..                ROS: All systems reviewed are negative as they relate to the chief complaint within the history of present illness.  Patient denies fevers or chills.  Assessment & Plan: Visit Diagnoses: No diagnosis found.  Plan: Impression is minimally displaced radial head fracture on the right-hand side.  Will keep her in the splint today.  Come back on Monday with radiographs out of the splint and then we will likely make her a removable splint to wear so that she can start doing some range of motion exercises.  Tylenol  3 prescribed.  He will need 3 views of the elbow out of the splint on her return visit Monday.  Follow-Up Instructions: No follow-ups on file.   Orders:  No orders of the defined types were placed in this encounter.  Meds ordered this encounter  Medications   acetaminophen -codeine  (TYLENOL  #3) 300-30 MG tablet    Sig: 1 po q 8 hrs prn    Dispense:  30 tablet    Refill:  0      Procedures: No procedures performed   Clinical Data: No additional findings.  Objective: Vital Signs: There were no vitals taken for this visit.  Physical Exam:  Constitutional: Patient appears well-developed HEENT:  Head: Normocephalic Eyes:EOM are normal Neck:  Normal range of motion Cardiovascular: Normal rate Pulmonary/chest: Effort normal Neurologic: Patient is alert Skin: Skin is warm Psychiatric: Patient has normal mood and affect  Ortho Exam: Ortho exam demonstrates intact EPL FPL interosseous function.  Shoulder range of motion nontender.  Review of radiographs demonstrates radial head fracture with 1 to 2 mm of displacement and minimal angulation.  Specialty Comments:  No specialty comments available.  Imaging: DG Wrist Complete Right Result Date: 02/02/2024 CLINICAL DATA:  Fall and trauma to the right upper extremity. EXAM: RIGHT WRIST - COMPLETE 3+ VIEW; RIGHT ELBOW - COMPLETE 3+ VIEW COMPARISON:  None Available. FINDINGS: Nondisplaced fracture of the radial head. No other acute fracture. No dislocation. The bones are osteopenic. There is elevation of the anterior and posterior fat pad suggestive of joint effusion. The soft tissues are unremarkable. IMPRESSION: Nondisplaced fracture of the radial head. Electronically Signed   By: Vanetta Chou M.D.   On: 02/02/2024 09:15   DG Elbow Complete Right Result Date: 02/02/2024 CLINICAL DATA:  Fall and trauma to the right upper extremity. EXAM: RIGHT WRIST - COMPLETE 3+ VIEW; RIGHT ELBOW - COMPLETE 3+ VIEW COMPARISON:  None Available. FINDINGS: Nondisplaced fracture of the radial head. No other acute fracture.  No dislocation. The bones are osteopenic. There is elevation of the anterior and posterior fat pad suggestive of joint effusion. The soft tissues are unremarkable. IMPRESSION: Nondisplaced fracture of the radial head. Electronically Signed   By: Vanetta Chou M.D.   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   Endometrial cancer (HCC) 08/24/2016   Past Medical History:  Diagnosis Date   Cancer (HCC) 2018   endometrial    Compression fx, lumbar spine (HCC)    Compression Fracture T12   Headache    hx migraines yrs ago   Malaria as child   Miscarriage    x 2   Pneumonia yrs ago   Seizures (HCC)    petite mal seizure in high school x 1, none since   Sinus infection 08/2017    Family History  Problem Relation Age of Onset   Cancer Mother    Breast cancer Mother    Cancer Sister        Cervix   Cancer Maternal Aunt        Breast, uterine   Colon cancer Maternal Aunt    Cancer Paternal Grandmother        Colon   Colon cancer Paternal Grandmother    Esophageal cancer Neg Hx    Pancreatic cancer Neg Hx    Stomach cancer Neg Hx     Past Surgical History:  Procedure Laterality Date   DENTAL SURGERY     gum graft 20 yrs ago and again on 08/26/16   DILATATION & CURETTAGE/HYSTEROSCOPY WITH MYOSURE N/A 08/11/2016   Procedure: DILATATION & CURETTAGE/HYSTEROSCOPY WITH MYOSURE;  Surgeon: Gretta Gums, MD;  Location: [facility];  Service: Gynecology;  Laterality: N/A;  polyp removal   IR FLUORO GUIDE PORT INSERTION RIGHT  11/10/2016   IR REMOVAL TUN ACCESS W/ PORT W/O FL MOD SED  03/11/2017   IR US  GUIDE VASC ACCESS RIGHT  11/10/2016   LYMPH NODE BIOPSY N/A 09/01/2016   Procedure: SENTINEL LYMPH NODE BIOPSY;  Surgeon: Eloy Herring, MD;  Location: [facility];  Service: Gynecology;  Laterality: N/A;   ROBOTIC ASSISTED TOTAL HYSTERECTOMY WITH BILATERAL SALPINGO OOPHERECTOMY Bilateral 09/01/2016   Procedure: ROBOTIC ASSISTED TOTAL HYSTERECTOMY WITH BILATERAL SALPINGO OOPHORECTOMY;  Surgeon: Eloy Herring, MD;  Location: [facility];  Service: Gynecology;  Laterality: Bilateral;   WISDOM TOOTH  EXTRACTION     Social History   Occupational History   Occupation: massage therapist  Tobacco Use   Smoking status: Former    Current packs/day: 1.50    Average packs/day: 1.5 packs/day for 8.0 years (12.0 ttl pk-yrs)    Types: Cigarettes   Smokeless tobacco: Never   Tobacco comments:    quit smoking at age 66  Vaping Use   Vaping status: Never Used  Substance and Sexual Activity   Alcohol use: Yes    Alcohol/week: 1.0 standard drink of alcohol    Types: 1 Glasses of wine per week    Comment: each night   Drug use: No   Sexual  activity: Yes    Birth control/protection: Post-menopausal

## 2024-02-07 ENCOUNTER — Encounter: Payer: Self-pay | Admitting: Orthopedic Surgery

## 2024-02-07 ENCOUNTER — Other Ambulatory Visit: Payer: Self-pay

## 2024-02-07 ENCOUNTER — Ambulatory Visit: Admitting: Orthopedic Surgery

## 2024-02-07 DIAGNOSIS — S52121A Displaced fracture of head of right radius, initial encounter for closed fracture: Secondary | ICD-10-CM

## 2024-02-07 NOTE — Progress Notes (Signed)
 Post-Op Visit Note   Patient: Danielle Guzman           Date of Birth: December 14, 1965           MRN: [id number] Visit Date: 02/07/2024 PCP: Patient, No Pcp Per   Assessment & Plan:  Chief Complaint:  Chief Complaint  Patient presents with   Right Wrist - Pain, Follow-up   Right Elbow - Follow-up, Pain   Visit Diagnoses:  1. Closed displaced fracture of head of right radius, initial encounter     Plan: Avayah is a 58 year old patient who is now about 6 days out right radial head fracture.  Patient has been immobilized in a splint.  Splint is removed today.  No mechanical block to pronation or supination.  Motor or sensory function of the hand is intact.  Radiographs show no significant change in fracture alignment.  At this time I would like for her to remain in the sling but 3 times a day do 30 reps of flexion and extension.  No pronation supination yet.  Repeat radiographs in 7 days.  Talked about potentially keeping her in a splint and the sling but I would say as long as she stays in the sling I do not think she necessarily needs to have additional immobilization on top of the sling.  Follow-Up Instructions: No follow-ups on file.   Orders:  Orders Placed This Encounter  Procedures   XR Elbow Complete Right (3+View)   No orders of the defined types were placed in this encounter.   Imaging: No results found.  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   Endometrial cancer (HCC) 08/24/2016   Past Medical History:   Diagnosis Date   Cancer (HCC) 2018   endometrial    Compression fx, lumbar spine (HCC)    Compression Fracture T12   Headache    hx migraines yrs ago   Malaria as child   Miscarriage    x 2   Pneumonia yrs ago   Seizures (HCC)    petite mal seizure in high school x 1, none since   Sinus infection 08/2017    Family History  Problem Relation Age of Onset   Cancer Mother    Breast cancer Mother    Cancer Sister        Cervix   Cancer Maternal Aunt        Breast, uterine   Colon cancer Maternal Aunt    Cancer Paternal Grandmother        Colon   Colon cancer Paternal Grandmother    Esophageal cancer Neg Hx    Pancreatic cancer Neg Hx    Stomach cancer Neg Hx     Past Surgical History:  Procedure Laterality Date   DENTAL SURGERY     gum graft 20 yrs ago and again on 08/26/16   DILATATION & CURETTAGE/HYSTEROSCOPY WITH MYOSURE N/A 08/11/2016   Procedure: DILATATION & CURETTAGE/HYSTEROSCOPY WITH MYOSURE;  Surgeon: Gretta Gums, MD;  Location: [facility];  Service: Gynecology;  Laterality: N/A;  polyp removal  IR FLUORO GUIDE PORT INSERTION RIGHT  11/10/2016   IR REMOVAL TUN ACCESS W/ PORT W/O FL MOD SED  03/11/2017   IR US  GUIDE VASC ACCESS RIGHT  11/10/2016   LYMPH NODE BIOPSY N/A 09/01/2016   Procedure: SENTINEL LYMPH NODE BIOPSY;  Surgeon: Eloy Herring, MD;  Location: [facility];  Service: Gynecology;  Laterality: N/A;   ROBOTIC ASSISTED TOTAL HYSTERECTOMY WITH BILATERAL SALPINGO OOPHERECTOMY Bilateral 09/01/2016   Procedure: ROBOTIC ASSISTED TOTAL HYSTERECTOMY WITH BILATERAL SALPINGO OOPHORECTOMY;  Surgeon: Eloy Herring, MD;  Location: [facility];  Service: Gynecology;  Laterality: Bilateral;   WISDOM TOOTH EXTRACTION     Social History   Occupational History   Occupation: massage therapist  Tobacco Use   Smoking status: Former    Current packs/day: 1.50    Average packs/day: 1.5 packs/day for 8.0 years (12.0 ttl pk-yrs)    Types: Cigarettes   Smokeless tobacco: Never   Tobacco  comments:    quit smoking at age 36  Vaping Use   Vaping status: Never Used  Substance and Sexual Activity   Alcohol use: Yes    Alcohol/week: 1.0 standard drink of alcohol    Types: 1 Glasses of wine per week    Comment: each night   Drug use: No   Sexual activity: Yes    Birth control/protection: Post-menopausal

## 2024-02-14 ENCOUNTER — Ambulatory Visit: Admitting: Orthopedic Surgery

## 2024-02-14 ENCOUNTER — Other Ambulatory Visit: Payer: Self-pay

## 2024-02-14 DIAGNOSIS — S52121A Displaced fracture of head of right radius, initial encounter for closed fracture: Secondary | ICD-10-CM

## 2024-02-14 MED ORDER — ACETAMINOPHEN-CODEINE 300-30 MG PO TABS: ORAL_TABLET | ORAL | 0 refills | Status: AC

## 2024-02-15 ENCOUNTER — Encounter: Payer: Self-pay | Admitting: Orthopedic Surgery

## 2024-02-15 NOTE — Progress Notes (Unsigned)
 Post-Op Visit Note   Patient: Danielle Guzman           Date of Birth: [birth year]           MRN: [id number] Visit Date: 02/14/2024 PCP: Patient, No Pcp Per   Assessment & Plan:  Chief Complaint:  Chief Complaint  Patient presents with   Right Elbow - Follow-up    7 day follow up for repeat xrays   Visit Diagnoses:  1. Closed displaced fracture of head of right radius, initial encounter     Plan: Danielle Guzman is a 58 year old patient with right radial head fracture.  She is a massage therapist.  She is 2 weeks out from her injury.  She has been in the sling doing some range of motion exercises about 4 or 5 times a day.  She has been doing any lifting.  Taking Tylenol  3 at night.  On exam she has good range of motion with no mechanical symptoms.  She does describe a little clicking with deeper flexion.  Plan at this time is to refill Tylenol  3.  Radiographs today show no change in fracture alignment.  2-week return with repeat radiographs.  Continue in the sling with range of motion exercises only flexion and extension but not to the point where her elbow is clicking.  We may take her out of the sling in 2 weeks if we see some callus formation and no further change in fracture alignment.  I think she is about 4 weeks minimum out from massage resumption  Follow-Up Instructions: No follow-ups on file.   Orders:  Orders Placed This Encounter  Procedures   XR Elbow Complete Right (3+View)   Meds ordered this encounter  Medications   acetaminophen -codeine  (TYLENOL  #3) 300-30 MG tablet    Sig: 1 po q 12 hrs prn    Dispense:  21 tablet    Refill:  0    Imaging: No results found.  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   Endometrial cancer (HCC) 08/24/2016   Past Medical History:  Diagnosis Date   Cancer (HCC) 2018   endometrial    Compression fx, lumbar spine (HCC)    Compression Fracture T12   Headache    hx migraines yrs ago   Malaria as child   Miscarriage    x 2   Pneumonia yrs ago   Seizures (HCC)    petite mal seizure in high school x 1, none since   Sinus infection 08/2017    Family History  Problem Relation Age of Onset   Cancer Mother    Breast cancer Mother    Cancer Sister        Cervix   Cancer Maternal Aunt        Breast, uterine   Colon cancer Maternal Aunt    Cancer Paternal Grandmother        Colon   Colon cancer Paternal Grandmother    Esophageal cancer Neg Hx    Pancreatic cancer Neg Hx    Stomach cancer Neg Hx  Past Surgical History:  Procedure Laterality Date   DENTAL SURGERY     gum graft 20 yrs ago and again on 08/26/16   DILATATION & CURETTAGE/HYSTEROSCOPY WITH MYOSURE N/A 08/11/2016   Procedure: DILATATION & CURETTAGE/HYSTEROSCOPY WITH MYOSURE;  Surgeon: Gretta Gums, MD;  Location: [facility];  Service: Gynecology;  Laterality: N/A;  polyp removal   IR FLUORO GUIDE PORT INSERTION RIGHT  11/10/2016   IR REMOVAL TUN ACCESS W/ PORT W/O FL MOD SED  03/11/2017   IR US  GUIDE VASC ACCESS RIGHT  11/10/2016   LYMPH NODE BIOPSY N/A 09/01/2016   Procedure: SENTINEL LYMPH NODE BIOPSY;  Surgeon: Eloy Herring, MD;  Location: [facility];  Service: Gynecology;  Laterality: N/A;   ROBOTIC ASSISTED TOTAL HYSTERECTOMY WITH BILATERAL SALPINGO OOPHERECTOMY Bilateral 09/01/2016   Procedure: ROBOTIC ASSISTED TOTAL HYSTERECTOMY WITH BILATERAL SALPINGO OOPHORECTOMY;  Surgeon: Eloy Herring, MD;  Location: [facility];  Service: Gynecology;  Laterality: Bilateral;   WISDOM TOOTH EXTRACTION      Social History   Occupational History   Occupation: massage therapist  Tobacco Use   Smoking status: Former    Current packs/day: 1.50    Average packs/day: 1.5 packs/day for 8.0 years (12.0 ttl pk-yrs)    Types: Cigarettes   Smokeless tobacco: Never   Tobacco comments:    quit smoking at age 67  Vaping Use   Vaping status: Never Used  Substance and Sexual Activity   Alcohol use: Yes    Alcohol/week: 1.0 standard drink of alcohol    Types: 1 Glasses of wine per week    Comment: each night   Drug use: No   Sexual activity: Yes    Birth control/protection: Post-menopausal

## 2024-02-28 ENCOUNTER — Ambulatory Visit: Admitting: Orthopedic Surgery

## 2024-02-28 ENCOUNTER — Inpatient Hospital Stay: Attending: Gynecologic Oncology | Admitting: Gynecologic Oncology

## 2024-02-28 ENCOUNTER — Encounter: Payer: Self-pay | Admitting: Orthopedic Surgery

## 2024-02-28 ENCOUNTER — Inpatient Hospital Stay

## 2024-02-28 ENCOUNTER — Other Ambulatory Visit: Payer: Self-pay

## 2024-02-28 VITALS — BP 142/76 | HR 67 | Temp 98.4°F | Resp 19 | Wt 232.6 lb

## 2024-02-28 DIAGNOSIS — R739 Hyperglycemia, unspecified: Secondary | ICD-10-CM | POA: Insufficient documentation

## 2024-02-28 DIAGNOSIS — N941 Unspecified dyspareunia: Secondary | ICD-10-CM | POA: Diagnosis not present

## 2024-02-28 DIAGNOSIS — M549 Dorsalgia, unspecified: Secondary | ICD-10-CM | POA: Diagnosis not present

## 2024-02-28 DIAGNOSIS — Z90722 Acquired absence of ovaries, bilateral: Secondary | ICD-10-CM | POA: Diagnosis not present

## 2024-02-28 DIAGNOSIS — R32 Unspecified urinary incontinence: Secondary | ICD-10-CM | POA: Insufficient documentation

## 2024-02-28 DIAGNOSIS — Z803 Family history of malignant neoplasm of breast: Secondary | ICD-10-CM | POA: Insufficient documentation

## 2024-02-28 DIAGNOSIS — R6881 Early satiety: Secondary | ICD-10-CM | POA: Diagnosis not present

## 2024-02-28 DIAGNOSIS — Z8542 Personal history of malignant neoplasm of other parts of uterus: Secondary | ICD-10-CM | POA: Diagnosis not present

## 2024-02-28 DIAGNOSIS — C541 Malignant neoplasm of endometrium: Secondary | ICD-10-CM

## 2024-02-28 DIAGNOSIS — Z8049 Family history of malignant neoplasm of other genital organs: Secondary | ICD-10-CM | POA: Insufficient documentation

## 2024-02-28 DIAGNOSIS — R600 Localized edema: Secondary | ICD-10-CM | POA: Diagnosis not present

## 2024-02-28 DIAGNOSIS — R140 Abdominal distension (gaseous): Secondary | ICD-10-CM | POA: Diagnosis not present

## 2024-02-28 DIAGNOSIS — R109 Unspecified abdominal pain: Secondary | ICD-10-CM | POA: Diagnosis not present

## 2024-02-28 DIAGNOSIS — R102 Pelvic and perineal pain unspecified side: Secondary | ICD-10-CM

## 2024-02-28 DIAGNOSIS — S52121A Displaced fracture of head of right radius, initial encounter for closed fracture: Secondary | ICD-10-CM

## 2024-02-28 DIAGNOSIS — M545 Low back pain, unspecified: Secondary | ICD-10-CM

## 2024-02-28 DIAGNOSIS — Z90710 Acquired absence of both cervix and uterus: Secondary | ICD-10-CM | POA: Diagnosis not present

## 2024-02-28 DIAGNOSIS — Z9221 Personal history of antineoplastic chemotherapy: Secondary | ICD-10-CM | POA: Diagnosis not present

## 2024-02-28 DIAGNOSIS — Z800 Family history of malignant neoplasm of digestive organs: Secondary | ICD-10-CM | POA: Insufficient documentation

## 2024-02-28 DIAGNOSIS — B3731 Acute candidiasis of vulva and vagina: Secondary | ICD-10-CM

## 2024-02-28 DIAGNOSIS — Z87891 Personal history of nicotine dependence: Secondary | ICD-10-CM | POA: Insufficient documentation

## 2024-02-28 DIAGNOSIS — R1030 Lower abdominal pain, unspecified: Secondary | ICD-10-CM

## 2024-02-28 DIAGNOSIS — Z1509 Genetic susceptibility to other malignant neoplasm: Secondary | ICD-10-CM | POA: Diagnosis not present

## 2024-02-28 LAB — CMP (CANCER CENTER ONLY)
ALT: 36 U/L (ref 0–44)
AST: 27 U/L (ref 15–41)
Albumin: 4.5 g/dL (ref 3.5–5.0)
Alkaline Phosphatase: 123 U/L (ref 38–126)
Anion gap: 11 (ref 5–15)
BUN: 12 mg/dL (ref 6–20)
CO2: 26 mmol/L (ref 22–32)
Calcium: 10 mg/dL (ref 8.9–10.3)
Chloride: 102 mmol/L (ref 98–111)
Creatinine: 0.91 mg/dL (ref 0.44–1.00)
GFR, Estimated: >60 mL/min (ref >60)
Glucose, Bld: 101 mg/dL — ABNORMAL HIGH (ref 70–99)
Potassium: 3.9 mmol/L (ref 3.5–5.1)
Sodium: 139 mmol/L (ref 135–145)
Total Bilirubin: 0.5 mg/dL (ref 0.0–1.2)
Total Protein: 7.1 g/dL (ref 6.5–8.1)

## 2024-02-28 MED ORDER — NYSTATIN 100000 UNIT/GM EX CREA: 1.0000 | TOPICAL_CREAM | Freq: Two times a day (BID) | CUTANEOUS | 3 refills | Status: AC

## 2024-02-28 MED ORDER — FLUCONAZOLE 150 MG PO TABS: 150.0000 mg | ORAL_TABLET | ORAL | 4 refills | Status: AC | PRN

## 2024-02-28 MED ORDER — FUROSEMIDE 20 MG PO TABS: ORAL_TABLET | Freq: Every day | ORAL | 3 refills | Status: AC | PRN

## 2024-02-28 MED ORDER — ESTRADIOL 0.01 % VA CREA: 1.0000 | TOPICAL_CREAM | VAGINAL | 12 refills | Status: AC

## 2024-02-28 NOTE — Patient Instructions (Addendum)
 It was great seeing you today. Given your persistent symptoms of pain, feeling full quicker, tenderness on exam, we will plan for a CT scan to further evaluate. We will contact you with the results.   We will check a metabolic panel to look at your kidney function prior to your CT scan. This will also check your glucose level.   Nothing to eat or drink 4 hours before your scan. Plan to arrive two hours before to begin drinking oral contrast.    Plan to follow up in six months or sooner if needed. Please call for any needs or concerns or new symptoms.    Symptoms to report to your health care team include vaginal bleeding, rectal bleeding, bloating, weight loss without effort, new and persistent pain, new and  persistent fatigue, new leg swelling, new masses (i.e., bumps in your neck or groin), new and persistent cough, new and persistent nausea and vomiting, change in bowel or bladder habits, and any other concerns.

## 2024-02-28 NOTE — Progress Notes (Signed)
 Post-Op Visit Note   Patient: Danielle Guzman           Date of Birth: 1966-02-27           MRN: [id number] Visit Date: 02/28/2024 PCP: Patient, No Pcp Per   Assessment & Plan:  Chief Complaint:  Chief Complaint  Patient presents with   Right Elbow - Follow-up, Fracture    DOI 02/02/24   Visit Diagnoses:  1. Closed displaced fracture of head of right radius, initial encounter     Plan: Patient sustained right radial head fracture 02/02/2024.  Doing well.  Less clicking today.  On exam she has about 5 degrees from full flexion and lacks about 25 degrees of full extension.  Has good pronation and supination with no mechanical symptoms.  Radiographs show no change in fracture alignment.  Plan is 2-week return for clinical recheck and radiographs.  I think that she will be able to return to work in mid January.  Follow-Up Instructions: No follow-ups on file.   Orders:  Orders Placed This Encounter  Procedures   XR Elbow Complete Right (3+View)   No orders of the defined types were placed in this encounter.   Imaging: No results found.  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   Endometrial cancer (HCC) 08/24/2016   Past Medical History:  Diagnosis Date   Cancer (HCC) 2018   endometrial    Compression fx, lumbar spine (HCC)    Compression Fracture T12   Headache    hx migraines yrs ago   Malaria as child   Miscarriage    x 2   Pneumonia yrs ago   Seizures (HCC)    petite mal seizure in high school x  1, none since   Sinus infection 08/2017    Family History  Problem Relation Age of Onset   Cancer Mother    Breast cancer Mother    Cancer Sister        Cervix   Cancer Maternal Aunt        Breast, uterine   Colon cancer Maternal Aunt    Cancer Paternal Grandmother        Colon   Colon cancer Paternal Grandmother    Esophageal cancer Neg Hx    Pancreatic cancer Neg Hx    Stomach cancer Neg Hx     Past Surgical History:  Procedure Laterality Date   DENTAL SURGERY     gum graft 20 yrs ago and again on 08/26/16   DILATATION & CURETTAGE/HYSTEROSCOPY WITH MYOSURE N/A 08/11/2016   Procedure: DILATATION & CURETTAGE/HYSTEROSCOPY WITH MYOSURE;  Surgeon: Gretta Gums, MD;  Location: [facility];  Service: Gynecology;  Laterality: N/A;  polyp removal   IR FLUORO GUIDE PORT INSERTION RIGHT  11/10/2016   IR REMOVAL TUN ACCESS W/ PORT W/O FL MOD SED  03/11/2017   IR US  GUIDE VASC ACCESS RIGHT  11/10/2016   LYMPH NODE BIOPSY N/A 09/01/2016   Procedure: SENTINEL LYMPH NODE BIOPSY;  Surgeon: Eloy Herring, MD;  Location: [facility];  Service: Gynecology;  Laterality: N/A;   ROBOTIC ASSISTED TOTAL HYSTERECTOMY WITH BILATERAL SALPINGO OOPHERECTOMY Bilateral 09/01/2016   Procedure: ROBOTIC ASSISTED TOTAL HYSTERECTOMY WITH BILATERAL SALPINGO OOPHORECTOMY;  Surgeon: Eloy Herring, MD;  Location: [facility];  Service: Gynecology;  Laterality: Bilateral;   WISDOM TOOTH EXTRACTION     Social History   Occupational History   Occupation: massage therapist  Tobacco Use   Smoking status: Former    Current packs/day: 1.50    Average packs/day: 1.5 packs/day for 8.0 years (12.0 ttl pk-yrs)    Types: Cigarettes   Smokeless tobacco: Never   Tobacco comments:    quit smoking at age 75  Vaping Use   Vaping status: Never Used  Substance and Sexual Activity   Alcohol use: Yes    Alcohol/week: 1.0 standard drink of alcohol    Types: 1 Glasses of wine per week    Comment: each night   Drug use: No   Sexual activity: Yes     Birth control/protection: Post-menopausal

## 2024-02-28 NOTE — Progress Notes (Unsigned)
 Follow-up Note: Gyn Oncology  Date: 02/28/2024  CC:  Chief Complaint  Patient presents with   Endometrial cancer [facility])   Assessment/Plan: Danielle Guzman  is a 58 y.o. 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 Maurilio Ship. 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 She completed adjuvant carboplatin  and paclitaxel  x 6 cycles between 11/12/2016 - 02/23/17. Declined genetics consultation despite loss of MSH6 on IHC and strong family history of cancer including a confirmed diagnosis of Lynch syndrome in her mother's twin sister.   No findings concerning for recurrence on today's examination but given reported symptoms and tenderness on exam, plan for CT scan to evaluate for recurrence. She will be contacted with the results of her scan and recommendations moving forward. CMP obtained today given CT being with contrast. Requested refills prescribed. She will continue her Vitamin D  maintenance dosing since improvement in deficiency with last labs. We discussed if glucose is elevated on CMP, potentially checking an A1C. She states she would not take medication if it was recommended and would attempt lifestyle changes. We also discussed measures to aid with weight loss.   She is advised to follow up in six months or sooner if any needs or symptoms arise. Reportable signs and symptoms reviewed. She will continue to follow up with her holistic provider. She is advised to call for any needs or concerns.  HPI: Danielle Guzman is a 58 year old [id number] initially seen in consultation at the request of Dr Rox and Dr Jolene Gaskins for newly diagnosed endometrial cancer.  She underwent early menopause at age 57 with all of her female relatives experiencing early menopause as well. She developed postmenopausal bleeding in March 2018 and sought care with Dr Rox on 05/2016. A TVUS showed an endometrium of 1.8 cm  with a 3 cm hypervascular polyp. On 08/11/16, she underwent a hysteroscopy/D&C with Dr. Jolene Gaskins. Final pathology revealed endometrial adenocarcinoma with the majority of the specimen revealing grade 2 endometrioid endometrial cancer with a small focus (5%) of clear cell carcinoma and a few foci suspicious for serous carcinoma.  Her family history includes her mother with a history of breast cancer x 3 and a sister with a history of cervical cancer. Her maternal aunt has a history of breast cancer. A different maternal aunt (her mother's twin sister) was diagnosed with Lynch Syndrome after having uterine and colon cancers. Her maternal cousin had uterine cancer. Her paternal grandmother had a diagnosis of colon cancer.  On 09/01/16, she underwent robotic assisted total hysterectomy, BSO, SLN biopsy with Dr. Maurilio Ship. Surgery was uncomplicated. The final pathology revealed a 1.8 cm grade 2 endometrioid endometrial cancer (with rare foci of squamous differentiation and a rare microscopic focus of serous differentiation). There was luminal foci of carcinoma within the right and left fallopian tubes and the right fallopian tube lumen involvement showed a focus of similar appearing chondroid stroma with adjacent focus of serous carcinoma. The lymph nodes and cervix were negative for carcinoma and the uterine tumor showed no myometrial invasion and no LVSI. Final pathology revealed loss of MSH6 on IHC.   She declined genetics consultation despite counseling that she may have Lynch syndrome which increases her risk for other cancers (that might be detected early or prevented with more frequent screenings that can be facilitated with this diagnosis).  Postoperatively, she completed 6 cycles of carboplatin  and paclitaxel  between 11/12/17-12/11/9 after  experiencing some trepidation regarding toxicity overwhelming benefits and this concern delayed initiation of adjuvant therapy.   She has a history of back and  abdominal pain, which prompted a CT abd/pelvis on February 28, 2018.  This showed mild superior endplate compression fracture deformity of T12 which was new from 2018, no retropulsion.  There was no evidence of recurrent or metastatic disease. She was seen by a chiropractor for this and feels that this is improved her symptoms substantially.  CT abd/pelvis in December, 2020 was ordered due to pelvic pain symptoms. This was negative for evidence of recurrence. In the Fall of 2022, she reported lower pelvic pain for the past three months, increasing since the end of October 2022. A CT scan of the abdomen and pelvis was obtained on 02/2021 to rule out recurrence. Findings were negative for lymphadenopathy or metastatic disease with stable tiny pulmonary nodules.   Due to an erythematous lesion noted at the vaginal cuff in 02/2021, she underwent a biopsy which returned as squamous mucosa with inflammation and reactive changes with no evidence of malignancy. A new lesion at the vaginal cuff was biopsied on 03/02/2022 returning chronic inflammation.   Interval Hx: Patient presents today to the office for continued follow up. She recently had a right elbow fracture after a fall. She has been involved with physical therapy and continues to follow with orthopedics. 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 She has left lateral neck soreness which she relates to wearing a sling. She has been experiencing early satiety and feels bloated often. In August, she started having lower back and lower abdominal/pelvic pain. This can be severe at times. Initially she was unable to lift her legs but this improved. She has seen a chiropractor for the back pain with some improvement but pain persists. She uses ibuprofen as needed. She continues to have intermittent urinary incontience. Denies dysuria and hematuria. She has experienced a 10 lb weight gain with no change in routine/eating habits.   She is  having normal bowel movements with no bleeding. No vaginal bleeding or discharge. Lower extremity non pitting mild edema unchanged. Previously she did pelvic floor PT and when thinking of following up, she is not sure what could be added/new to the recommendations. She continues to have pain with intercourse. She has been using the vaginal estrogen twice weekly. She uses lasix  prn for leg edema, diflucan  as needed for yeast symptoms, topical nystatin  cream to yeast between skin folds.  She continues to perform frequent breast exams and the area palpated in the right breast earlier this year has resolved. She did undergo breast thermoscan along with an ultrasound on 07/05/2023 and findings negative.  She is planning for follow up with her dermatologist to check several areas.  Review of Systems: See interval. On ROS intake form, pelvic pain, back pain, abdominal pain, pain with intercourse, weight gain, some incontinence. Constitutional: Feels well. + early satiety. She has had some weight gain since last visit.  Cardiovascular: No chest pain, shortness of breath. Mild edema in lower extremities.  Pulmonary: No cough or wheeze.  Gastrointestinal: No nausea, vomiting, or diarrhea. No bright red blood per rectum or change in bowel movement.  Genitourinary: No frequency, urgency, or dysuria.  Musculoskeletal: Lower back and abdominal pain. Neurologic: No weakness, numbness, or change in gait.  Psychology: Tylenol  #3 has been helping with sleep and right arm pain. Skin: Skin changes under left eye remain (slightly larger, pt to call  derm to follow up), seborrheic keratosis on right shoulder  Health Maintenance: Breast: Had thermoscan and ultrasound Colonoscopy: Declines to have, saw GI in the past for anal lesion, states GI said to call prn Declines PCP referral. Sees holistic provider  Current Meds:  Outpatient Encounter Medications as of 02/28/2024  Medication Sig   [DISCONTINUED]  acetaminophen -codeine  (TYLENOL  #3) 300-30 MG tablet Take 1-2 tablets by mouth every 4 (four) hours as needed for moderate pain (pain score 4-6) or severe pain (pain score 7-10).   acetaminophen -codeine  (TYLENOL  #3) 300-30 MG tablet 1 po q 12 hrs prn   APPLE CIDER VINEGAR PO Take 1 Dose by mouth daily. Patient states she takes Web Designer 1tsp. Daily.   Ascorbic Acid (VITAMIN C PO) Take 1 tablet by mouth 3 (three) times daily as needed (for immune health support or cold symptoms.).   b complex vitamins tablet Take 1 tablet by mouth daily.   brompheniramine-pseudoephedrine-DM 30-2-10 MG/5ML syrup Take 5 mLs by mouth 4 (four) times daily as needed. cough   calcium citrate (CALCITRATE - DOSED IN MG ELEMENTAL CALCIUM) 950 MG tablet Take 200 mg of elemental calcium by mouth daily. Patient states she takes 6 pills a day.   cholecalciferol (VITAMIN D3) 25 MCG (1000 UNIT) tablet Take by mouth daily. Patient states she is taking 10,000 international units per day   clobetasol (TEMOVATE) 0.05 % external solution Apply topically 2 (two) times daily as needed.   estradiol  (ESTRACE  VAGINAL) 0.1 MG/GM vaginal cream Insert 1 applicatorful vaginally 2 times a week.   fluconazole  (DIFLUCAN ) 150 MG tablet Take 1 tablet (150 mg total) by mouth for vulvar yeast.  May repeat if needed   fluticasone  (FLONASE ) 50 MCG/ACT nasal spray Place 1 spray into both nostrils 2 (two) times daily.   furosemide  (LASIX ) 20 MG tablet TAKE 1 TABLET BY MOUTH DAILY AS NEEDED   guaiFENesin  (MUCINEX ) 600 MG 12 hr tablet Take 1 tablet (600 mg total) by mouth 2 (two) times daily.   ibuprofen (ADVIL) 200 MG tablet Take 200-400 mg by mouth every 8 (eight) hours as needed.   nystatin  cream (MYCOSTATIN ) Apply to groin 2 times daily as needed for yeast or itching   Omega-3 Fatty Acids (FISH OIL) 1000 MG CAPS Take 1 capsule by mouth daily.    OVER THE COUNTER MEDICATION Take 1 tablet by mouth daily. Tumeric once a day.   OVER THE COUNTER  MEDICATION Nitric oxide   Pediatric Multiple Vit-Vit C (VITAMIN DAILY PO) Take 10,000 Units by mouth daily.   Probiotic Product (PROBIOTIC DAILY PO) Take 1 capsule by mouth daily.   promethazine -dextromethorphan (PROMETHAZINE -DM) 6.25-15 MG/5ML syrup Take 5 mLs by mouth 4 (four) times daily as needed.   No facility-administered encounter medications on file as of 02/28/2024.    Allergy:  Allergies  Allergen Reactions   Latex     Long exposure causes a rash   Tamiflu  [Oseltamivir Phosphate] Nausea Only    Social Hx:   Social History   Socioeconomic History   Marital status: Married    Spouse name: Nancyann [place]   Number of children: 0   Years of education: Not on file   Highest education level: Not on file  Occupational History   Occupation: massage therapist  Tobacco Use   Smoking status: Former    Current packs/day: 1.50    Average packs/day: 1.5 packs/day for 8.0 years (12.0 ttl pk-yrs)    Types: Cigarettes   Smokeless tobacco: Never   Tobacco  comments:    quit smoking at age 86  Vaping Use   Vaping status: Never Used  Substance and Sexual Activity   Alcohol use: Yes    Alcohol/week: 1.0 standard drink of alcohol    Types: 1 Glasses of wine per week    Comment: each night   Drug use: No   Sexual activity: Yes    Birth control/protection: Post-menopausal  Other Topics Concern   Not on file  Social History Narrative   Not on file   Social Drivers of Health   Tobacco Use: Medium Risk (02/15/2024)   Patient History    Smoking Tobacco Use: Former    Smokeless Tobacco Use: Never    Passive Exposure: Not on Actuary Strain: Not on file  Food Insecurity: Not on file  Transportation Needs: Not on file  Physical Activity: Not on file  Stress: Not on file  Social Connections: Not on file  Intimate Partner Violence: Not on file  Depression (EYV7-0): Not on file  Alcohol Screen: Not on file  Housing: Not on file  Utilities: Not on file   Health Literacy: Not on file    Past Surgical Hx:  Past Surgical History:  Procedure Laterality Date   DENTAL SURGERY     gum graft 20 yrs ago and again on 08/26/16   DILATATION & CURETTAGE/HYSTEROSCOPY WITH MYOSURE N/A 08/11/2016   Procedure: DILATATION & CURETTAGE/HYSTEROSCOPY WITH MYOSURE;  Surgeon: Gretta Gums, MD;  Location: [facility];  Service: Gynecology;  Laterality: N/A;  polyp removal   IR FLUORO GUIDE PORT INSERTION RIGHT  11/10/2016   IR REMOVAL TUN ACCESS W/ PORT W/O FL MOD SED  03/11/2017   IR US  GUIDE VASC ACCESS RIGHT  11/10/2016   LYMPH NODE BIOPSY N/A 09/01/2016   Procedure: SENTINEL LYMPH NODE BIOPSY;  Surgeon: Eloy Herring, MD;  Location: [facility];  Service: Gynecology;  Laterality: N/A;   ROBOTIC ASSISTED TOTAL HYSTERECTOMY WITH BILATERAL SALPINGO OOPHERECTOMY Bilateral 09/01/2016   Procedure: ROBOTIC ASSISTED TOTAL HYSTERECTOMY WITH BILATERAL SALPINGO OOPHORECTOMY;  Surgeon: Eloy Herring, MD;  Location: [facility];  Service: Gynecology;  Laterality: Bilateral;   WISDOM TOOTH EXTRACTION      Past Medical Hx:  Past Medical History:  Diagnosis Date   Cancer (HCC) 2018   endometrial    Compression fx, lumbar spine (HCC)    Compression Fracture T12   Headache    hx migraines yrs ago   Malaria as child   Miscarriage    x 2   Pneumonia yrs ago   Seizures (HCC)    petite mal seizure in high school x 1, none since   Sinus infection 08/2017    Past Gynecological History:  Premature menopause No LMP recorded. Patient has had a hysterectomy.  Family Hx:  Family History  Problem Relation Age of Onset   Cancer Mother    Breast cancer Mother    Cancer Sister        Cervix   Cancer Maternal Aunt        Breast, uterine   Colon cancer Maternal Aunt    Cancer Paternal Grandmother        Colon   Colon cancer Paternal Grandmother    Esophageal cancer Neg Hx    Pancreatic cancer Neg Hx    Stomach cancer Neg Hx     Vitals:  Blood pressure (!) 153/75, pulse 67, temperature  98.4 F (36.9 C), temperature source Oral, resp. rate 19, weight 232 lb 9.6 oz (105.5  kg), SpO2 100%.  Physical Exam: General: Well developed, well nourished female in no acute distress. Alert and oriented x 3.  Neck: Supple without any enlargements.  Lymph node survey: No cervical, supraclavicular, or inguinal adenopathy.  Cardiovascular: Regular rate and rhythm. S1 and S2 normal.  Lungs: Clear to auscultation bilaterally. No wheezes/crackles/rhonchi noted.  Skin: Seborrheic keratosis noted on right shoulder. Small area of erythema with dry skin under left eye. Back: No CVA tenderness. Tenderness reported with palpation of lower back. Musculature appears more swollen on right side of lower back.   Breasts: Deferred given recent thermoscan and ultrasound Abdomen: Abdomen soft, non-tender and obese. Active bowel sounds in all quadrants. No evidence of a fluid wave or abdominal masses but limited due to habitus. No nodularity noted at healed laparoscopic sites.  Genitourinary:    Vulva/vagina: Normal external female genitalia. No lesions.    Urethra: No lesions or masses.    Vagina: Vagina atrophic. No bleeding or discharge noted. No vaginal lesions noted. No palpable masses or nodularity. Tenderness on palpation of the cuff. Rectal: Good tone, no masses, no cul de sac nodularity.  Extremities: Bilateral lower extremity edema, non pitting, equal. No bilateral cyanosis or clubbing.    Eleanor JONETTA Epps, NP  02/28/2024, 1:28 PM

## 2024-02-29 ENCOUNTER — Ambulatory Visit: Admitting: Gynecologic Oncology

## 2024-02-29 ENCOUNTER — Ambulatory Visit: Payer: Self-pay | Admitting: *Deleted

## 2024-02-29 DIAGNOSIS — R635 Abnormal weight gain: Secondary | ICD-10-CM | POA: Insufficient documentation

## 2024-02-29 DIAGNOSIS — R109 Unspecified abdominal pain: Secondary | ICD-10-CM | POA: Insufficient documentation

## 2024-02-29 NOTE — Telephone Encounter (Signed)
-----   Message from Eleanor Epps, NP sent at 02/28/2024  2:51 PM EST ----- Please let her know CMP results:  Glucose is at 101 with normal range 70-99. I think we do not need to proceed with an A1C based on this result at this time.   Kidney function is at 0.91 with normal range 0.44-1. This is at the higher end of normal. Would make sure she is staying hydrated.   We will let her know the results of her CT scan when this returns.

## 2024-02-29 NOTE — Telephone Encounter (Signed)
 Spoke with patient and relayed result message from [facility], NP. CMP results are back and glucose is at 101 with normal range 70-99. I think we do not need to proceed with an A1C based on this result at this time.   Kidney function is at 0.91 with normal range 0.44-1. This is a the higher end of normal. Would make sure to stay well hydrated.   We will let you know the results of the CT scan when this returns.   Pt verbalized understanding and thanked the office for calling.

## 2024-03-02 ENCOUNTER — Ambulatory Visit: Admitting: Orthopedic Surgery

## 2024-03-03 ENCOUNTER — Ambulatory Visit: Admitting: Orthopedic Surgery

## 2024-03-04 ENCOUNTER — Ambulatory Visit (HOSPITAL_BASED_OUTPATIENT_CLINIC_OR_DEPARTMENT_OTHER)
Admission: RE | Admit: 2024-03-04 | Discharge: 2024-03-04 | Disposition: A | Discharge status: Home / Self Care | Source: Ambulatory Visit | Attending: Gynecologic Oncology | Admitting: Gynecologic Oncology

## 2024-03-04 DIAGNOSIS — R6881 Early satiety: Secondary | ICD-10-CM | POA: Insufficient documentation

## 2024-03-04 DIAGNOSIS — M545 Low back pain, unspecified: Secondary | ICD-10-CM | POA: Diagnosis not present

## 2024-03-04 DIAGNOSIS — C541 Malignant neoplasm of endometrium: Secondary | ICD-10-CM | POA: Insufficient documentation

## 2024-03-04 DIAGNOSIS — R102 Pelvic and perineal pain unspecified side: Secondary | ICD-10-CM | POA: Diagnosis not present

## 2024-03-04 MED ORDER — IOHEXOL 300 MG/ML  SOLN
100.0000 mL | Freq: Once | INTRAMUSCULAR | Status: AC | PRN
  Administered 2024-03-04: 100 mL via INTRAVENOUS

## 2024-03-13 NOTE — Telephone Encounter (Signed)
-----   Message from Eleanor Epps, NP sent at 03/13/2024 11:12 AM EST ----- Please let the patient know they read her CT scan this am. Results below:  OVERALL NO SIGN OF CANCER RETURN OR SPREAD.  IMPRESSION: 1. No evidence of recurrent or metastatic disease. 2. 1.2 cm left adrenal nodule, likely adenoma. (THIS HAS NOT CHANGED) 3. Mild diffuse hepatic steatosis without focal mass. (FATTY LIVER) 4. Chronic T12 vertebral body height loss related to a superior endplate Schmorl node.

## 2024-03-13 NOTE — Telephone Encounter (Signed)
 Spoke with patient and relayed result message from Eleanor Epps, NP see below. Pt verbalized understanding and is relieved with the results of her CT scan.  Pt is asking about suggestions for fatty liver? Pt states she will also ask her naturopath provider. Pt thanked the office for calling.

## 2024-03-14 NOTE — Telephone Encounter (Signed)
 Attempted to reach patient to relay message from provider in regards to patient's question. Left voicemail requesting call back.

## 2024-03-14 NOTE — Telephone Encounter (Signed)
-----   Message from Eleanor Epps, NP sent at 03/13/2024 11:12 AM EST ----- Please let the patient know they read her CT scan this am. Results below:  OVERALL NO SIGN OF CANCER RETURN OR SPREAD.  IMPRESSION: 1. No evidence of recurrent or metastatic disease. 2. 1.2 cm left adrenal nodule, likely adenoma. (THIS HAS NOT CHANGED) 3. Mild diffuse hepatic steatosis without focal mass. (FATTY LIVER) 4. Chronic T12 vertebral body height loss related to a superior endplate Schmorl node.

## 2024-03-15 ENCOUNTER — Other Ambulatory Visit: Payer: Self-pay

## 2024-03-15 ENCOUNTER — Encounter: Payer: Self-pay | Admitting: Orthopedic Surgery

## 2024-03-15 ENCOUNTER — Ambulatory Visit: Admitting: Orthopedic Surgery

## 2024-03-15 DIAGNOSIS — S52121A Displaced fracture of head of right radius, initial encounter for closed fracture: Secondary | ICD-10-CM

## 2024-03-15 NOTE — Telephone Encounter (Signed)
-----   Message from Eleanor Epps, NP sent at 03/13/2024  2:38 PM EST ----- Weight loss and limiting fat intake, cholesterol intake. ----- Message ----- From: Nevelyn Ami CROME, RN Sent: 03/13/2024  12:24 PM EST To: Eleanor JONETTA Epps, NP  ----- Message from Ami CROME Nevelyn, RN sent at 03/13/2024 12:24 PM EST -----   ----- Message ----- From: Epps Eleanor D, NP Sent: 03/13/2024  11:13 AM EST To: Ami CROME Nevelyn, RN  Please let the patient know they read her CT scan this am. Results below:  OVERALL NO SIGN OF CANCER RETURN OR SPREAD.  IMPRESSION: 1. No evidence of recurrent or metastatic disease. 2. 1.2 cm left adrenal nodule, likely adenoma. (THIS HAS NOT CHANGED) 3. Mild diffuse hepatic steatosis without focal mass. (FATTY LIVER) 4. Chronic T12 vertebral body height loss related to a superior endplate Schmorl node.

## 2024-03-15 NOTE — Progress Notes (Unsigned)
 "  Post-Op Visit Note   Patient: Danielle Guzman           Date of Birth: 27-May-1965           MRN: [id number] Visit Date: 03/15/2024 PCP: Patient, No Pcp Per   Assessment & Plan:  Chief Complaint:  Chief Complaint  Patient presents with   Right Elbow - Follow-up, Fracture    DOI 02/02/24   Visit Diagnoses:  1. Closed displaced fracture of head of right radius, initial encounter     Plan: Nielle is now about 6 weeks out right elbow radial head fracture.  Takes occasional ibuprofen.  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  On exam right elbow has range of motion of about 10-1 20.  Range of motion of the left is 0-1 30.  Pronation supination is full with no mechanical symptoms.  Radiographs show no change in fracture alignment with some callus formation present.  Plan at this time is return to activity nearly as tolerated in that right elbow.  Would like for her to get a little bit more range of motion first.  Avoid push-ups and supporting all of her weight on that right arm for at least 6 more weeks.  She will follow-up with us  as needed.  Follow-Up Instructions: No follow-ups on file.   Orders:  Orders Placed This Encounter  Procedures   XR Elbow Complete Right (3+View)   No orders of the defined types were placed in this encounter.   Imaging: XR Elbow Complete Right (3+View) Result Date: 03/15/2024 AP lateral radial head view right elbow reviewed.  Radial head fracture is present with 2 mm of displacement and step-off.  Callus formation noted around the neck of the radius.  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   Goals of care, counseling/discussion 11/04/2016   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   Endometrial cancer (HCC) 08/24/2016   Past Medical History:  Diagnosis Date   Cancer (HCC) 2018   endometrial    Compression fx, lumbar spine (HCC)    Compression Fracture T12   Headache    hx migraines yrs ago   Malaria as child   Miscarriage    x 2   Pneumonia yrs ago   Seizures (HCC)    petite mal seizure in high school x 1, none since   Sinus infection 08/2017    Family History  Problem Relation Age of Onset   Cancer Mother    Breast cancer Mother    Cancer Sister        Cervix   Cancer Maternal Aunt        Breast, uterine   Colon cancer Maternal Aunt    Cancer Paternal Grandmother        Colon   Colon cancer Paternal Grandmother    Esophageal cancer Neg Hx    Pancreatic cancer Neg Hx    Stomach cancer Neg Hx  Past Surgical History:  Procedure Laterality Date   DENTAL SURGERY     gum graft 20 yrs ago and again on 08/26/16   DILATATION & CURETTAGE/HYSTEROSCOPY WITH MYOSURE N/A 08/11/2016   Procedure: DILATATION & CURETTAGE/HYSTEROSCOPY WITH MYOSURE;  Surgeon: Gretta Gums, MD;  Location: [facility];  Service: Gynecology;  Laterality: N/A;  polyp removal   IR FLUORO GUIDE PORT INSERTION RIGHT  11/10/2016   IR REMOVAL TUN ACCESS W/ PORT W/O FL MOD SED  03/11/2017   IR US  GUIDE VASC ACCESS RIGHT  11/10/2016   LYMPH NODE BIOPSY N/A 09/01/2016   Procedure: SENTINEL LYMPH NODE BIOPSY;  Surgeon: Eloy Herring, MD;  Location: [facility];  Service: Gynecology;  Laterality: N/A;   ROBOTIC ASSISTED TOTAL HYSTERECTOMY WITH BILATERAL SALPINGO OOPHERECTOMY Bilateral 09/01/2016   Procedure: ROBOTIC ASSISTED TOTAL HYSTERECTOMY WITH BILATERAL SALPINGO OOPHORECTOMY;  Surgeon: Eloy Herring, MD;  Location:  [facility];  Service: Gynecology;  Laterality: Bilateral;   WISDOM TOOTH EXTRACTION     Social History   Occupational History   Occupation: massage therapist  Tobacco Use   Smoking status: Former    Current packs/day: 1.50    Average packs/day: 1.5 packs/day for 8.0 years (12.0 ttl pk-yrs)    Types: Cigarettes   Smokeless tobacco: Never   Tobacco comments:    quit smoking at age 10  Vaping Use   Vaping status: Never Used  Substance and Sexual Activity   Alcohol use: Yes    Alcohol/week: 1.0 standard drink of alcohol    Types: 1 Glasses of wine per week    Comment: each night   Drug use: No   Sexual activity: Yes    Birth control/protection: Post-menopausal     "

## 2024-03-15 NOTE — Telephone Encounter (Signed)
 Spoke with patient to relay her answer to question about advice on fatty liver  -weight loss, limiting fat intake and cholesterol intake. Pt verbalized understanding and thanked the office.

## 2024-08-22 ENCOUNTER — Inpatient Hospital Stay: Admitting: Gynecologic Oncology

## 2024-08-28 ENCOUNTER — Inpatient Hospital Stay: Admitting: Gynecologic Oncology
